# Patient Record
Sex: Female | Born: 1935
Health system: Southern US, Community
[De-identification: ages and names within clinical notes are randomized; demographics above are authoritative.]

## PROBLEM LIST (undated history)

## (undated) ENCOUNTER — Emergency Department (HOSPITAL_COMMUNITY): Admission: EM | Payer: Medicare PPO | Source: Home / Self Care

## (undated) DIAGNOSIS — J309 Allergic rhinitis, unspecified: Secondary | ICD-10-CM

## (undated) DIAGNOSIS — J069 Acute upper respiratory infection, unspecified: Secondary | ICD-10-CM

## (undated) DIAGNOSIS — Z9189 Other specified personal risk factors, not elsewhere classified: Secondary | ICD-10-CM

## (undated) DIAGNOSIS — IMO0001 Reserved for inherently not codable concepts without codable children: Secondary | ICD-10-CM

## (undated) DIAGNOSIS — M129 Arthropathy, unspecified: Secondary | ICD-10-CM

## (undated) DIAGNOSIS — M2550 Pain in unspecified joint: Secondary | ICD-10-CM

## (undated) DIAGNOSIS — T4145XA Adverse effect of unspecified anesthetic, initial encounter: Secondary | ICD-10-CM

## (undated) DIAGNOSIS — M25539 Pain in unspecified wrist: Secondary | ICD-10-CM

## (undated) DIAGNOSIS — M549 Dorsalgia, unspecified: Secondary | ICD-10-CM

## (undated) DIAGNOSIS — Z8489 Family history of other specified conditions: Secondary | ICD-10-CM

## (undated) DIAGNOSIS — T8859XA Other complications of anesthesia, initial encounter: Secondary | ICD-10-CM

## (undated) DIAGNOSIS — M1389 Other specified arthritis, multiple sites: Secondary | ICD-10-CM

## (undated) DIAGNOSIS — E785 Hyperlipidemia, unspecified: Secondary | ICD-10-CM

## (undated) DIAGNOSIS — G4700 Insomnia, unspecified: Secondary | ICD-10-CM

## (undated) DIAGNOSIS — I10 Essential (primary) hypertension: Secondary | ICD-10-CM

## (undated) DIAGNOSIS — L03119 Cellulitis of unspecified part of limb: Secondary | ICD-10-CM

## (undated) DIAGNOSIS — L02519 Cutaneous abscess of unspecified hand: Secondary | ICD-10-CM

## (undated) DIAGNOSIS — M542 Cervicalgia: Secondary | ICD-10-CM

## (undated) HISTORY — DX: Other specified personal risk factors, not elsewhere classified: Z91.89

## (undated) HISTORY — DX: Essential (primary) hypertension: I10

## (undated) HISTORY — DX: Dorsalgia, unspecified: M54.9

## (undated) HISTORY — DX: Hyperlipidemia, unspecified: E78.5

## (undated) HISTORY — PX: BUNIONECTOMY: SHX129

## (undated) HISTORY — DX: Insomnia, unspecified: G47.00

## (undated) HISTORY — DX: Pain in unspecified wrist: M25.539

## (undated) HISTORY — DX: Reserved for inherently not codable concepts without codable children: IMO0001

## (undated) HISTORY — DX: Cervicalgia: M54.2

## (undated) HISTORY — PX: OTHER SURGICAL HISTORY: SHX169

## (undated) HISTORY — DX: Cutaneous abscess of unspecified hand: L02.519

## (undated) HISTORY — DX: Pain in unspecified joint: M25.50

## (undated) HISTORY — DX: Acute upper respiratory infection, unspecified: J06.9

## (undated) HISTORY — DX: Allergic rhinitis, unspecified: J30.9

## (undated) HISTORY — DX: Cellulitis of unspecified part of limb: L03.119

## (undated) HISTORY — PX: FOOT SURGERY: SHX648

## (undated) HISTORY — DX: Arthropathy, unspecified: M12.9

## (undated) HISTORY — PX: JOINT REPLACEMENT: SHX530

## (undated) HISTORY — DX: Other specified arthritis, multiple sites: M13.89

---

## 1998-10-03 ENCOUNTER — Other Ambulatory Visit: Admission: RE | Admit: 1998-10-03 | Discharge: 1998-10-03 | Payer: Self-pay | Admitting: Emergency Medicine

## 1999-10-16 ENCOUNTER — Other Ambulatory Visit: Admission: RE | Admit: 1999-10-16 | Discharge: 1999-10-16 | Payer: Self-pay | Admitting: Emergency Medicine

## 2001-01-14 ENCOUNTER — Other Ambulatory Visit: Admission: RE | Admit: 2001-01-14 | Discharge: 2001-01-14 | Payer: Self-pay | Admitting: Emergency Medicine

## 2001-05-26 ENCOUNTER — Encounter: Admission: RE | Admit: 2001-05-26 | Discharge: 2001-06-16 | Payer: Self-pay | Admitting: Orthopedic Surgery

## 2002-04-06 ENCOUNTER — Other Ambulatory Visit: Admission: RE | Admit: 2002-04-06 | Discharge: 2002-04-06 | Payer: Self-pay | Admitting: Family Medicine

## 2002-06-16 ENCOUNTER — Encounter: Payer: Self-pay | Admitting: Family Medicine

## 2002-06-16 ENCOUNTER — Ambulatory Visit (HOSPITAL_COMMUNITY): Admission: RE | Admit: 2002-06-16 | Discharge: 2002-06-16 | Payer: Self-pay | Admitting: Family Medicine

## 2003-04-09 ENCOUNTER — Other Ambulatory Visit: Admission: RE | Admit: 2003-04-09 | Discharge: 2003-04-09 | Payer: Self-pay | Admitting: Family Medicine

## 2004-04-21 ENCOUNTER — Other Ambulatory Visit: Admission: RE | Admit: 2004-04-21 | Discharge: 2004-04-21 | Payer: Self-pay | Admitting: Family Medicine

## 2005-04-22 ENCOUNTER — Other Ambulatory Visit: Admission: RE | Admit: 2005-04-22 | Discharge: 2005-04-22 | Payer: Self-pay | Admitting: Family Medicine

## 2005-09-14 ENCOUNTER — Ambulatory Visit (HOSPITAL_COMMUNITY): Admission: RE | Admit: 2005-09-14 | Discharge: 2005-09-14 | Payer: Self-pay | Admitting: Orthopedic Surgery

## 2005-09-28 ENCOUNTER — Emergency Department (HOSPITAL_COMMUNITY): Admission: EM | Admit: 2005-09-28 | Discharge: 2005-09-29 | Payer: Self-pay | Admitting: Emergency Medicine

## 2006-03-29 ENCOUNTER — Encounter: Admission: RE | Admit: 2006-03-29 | Discharge: 2006-03-29 | Payer: Self-pay | Admitting: Orthopedic Surgery

## 2006-04-01 ENCOUNTER — Ambulatory Visit (HOSPITAL_BASED_OUTPATIENT_CLINIC_OR_DEPARTMENT_OTHER): Admission: RE | Admit: 2006-04-01 | Discharge: 2006-04-02 | Payer: Self-pay | Admitting: Orthopedic Surgery

## 2006-04-01 ENCOUNTER — Encounter (INDEPENDENT_AMBULATORY_CARE_PROVIDER_SITE_OTHER): Payer: Self-pay | Admitting: Specialist

## 2006-04-29 ENCOUNTER — Other Ambulatory Visit: Admission: RE | Admit: 2006-04-29 | Discharge: 2006-04-29 | Payer: Self-pay | Admitting: Family Medicine

## 2007-03-21 ENCOUNTER — Ambulatory Visit: Payer: Self-pay | Admitting: Internal Medicine

## 2007-07-21 ENCOUNTER — Ambulatory Visit: Payer: Self-pay | Admitting: Internal Medicine

## 2007-10-28 DIAGNOSIS — E785 Hyperlipidemia, unspecified: Secondary | ICD-10-CM

## 2007-10-28 DIAGNOSIS — M179 Osteoarthritis of knee, unspecified: Secondary | ICD-10-CM | POA: Insufficient documentation

## 2007-10-28 DIAGNOSIS — Z9189 Other specified personal risk factors, not elsewhere classified: Secondary | ICD-10-CM

## 2007-10-28 DIAGNOSIS — I10 Essential (primary) hypertension: Secondary | ICD-10-CM

## 2007-10-28 DIAGNOSIS — M129 Arthropathy, unspecified: Secondary | ICD-10-CM

## 2007-10-28 DIAGNOSIS — M1710 Unilateral primary osteoarthritis, unspecified knee: Secondary | ICD-10-CM

## 2007-10-28 DIAGNOSIS — J309 Allergic rhinitis, unspecified: Secondary | ICD-10-CM

## 2007-10-28 DIAGNOSIS — J302 Other seasonal allergic rhinitis: Secondary | ICD-10-CM | POA: Insufficient documentation

## 2007-10-28 HISTORY — DX: Essential (primary) hypertension: I10

## 2007-10-28 HISTORY — DX: Allergic rhinitis, unspecified: J30.9

## 2007-10-28 HISTORY — DX: Hyperlipidemia, unspecified: E78.5

## 2007-10-28 HISTORY — DX: Arthropathy, unspecified: M12.9

## 2007-10-28 HISTORY — DX: Other specified personal risk factors, not elsewhere classified: Z91.89

## 2007-10-31 ENCOUNTER — Ambulatory Visit: Payer: Self-pay | Admitting: Internal Medicine

## 2007-10-31 DIAGNOSIS — G4700 Insomnia, unspecified: Secondary | ICD-10-CM

## 2007-10-31 HISTORY — DX: Insomnia, unspecified: G47.00

## 2008-04-11 ENCOUNTER — Ambulatory Visit: Payer: Self-pay | Admitting: Internal Medicine

## 2008-04-11 LAB — CONVERTED CEMR LAB
AST: 23 units/L (ref 0–37)
Albumin: 4.1 g/dL (ref 3.5–5.2)
CO2: 29 meq/L (ref 19–32)
Calcium: 9.5 mg/dL (ref 8.4–10.5)
Direct LDL: 84.1 mg/dL
HCT: 37.5 % (ref 36.0–46.0)
HDL: 97.8 mg/dL (ref >39.0)
Lymphocytes Relative: 25 % (ref 12.0–46.0)
MCV: 91.5 fL (ref 78.0–100.0)
Neutrophils Relative %: 66.5 % (ref 43.0–77.0)
Potassium: 3.9 meq/L (ref 3.5–5.1)
RBC: 4.1 M/uL (ref 3.87–5.11)
RDW: 12.8 % (ref 11.5–14.6)
Sodium: 149 meq/L — ABNORMAL HIGH (ref 135–145)
Total CHOL/HDL Ratio: 2.1
WBC: 7.5 10*3/uL (ref 4.5–10.5)

## 2008-04-12 ENCOUNTER — Telehealth: Payer: Self-pay | Admitting: Internal Medicine

## 2008-04-12 ENCOUNTER — Encounter: Payer: Self-pay | Admitting: Internal Medicine

## 2008-04-16 ENCOUNTER — Telehealth: Payer: Self-pay | Admitting: Internal Medicine

## 2008-05-08 ENCOUNTER — Encounter: Payer: Self-pay | Admitting: Internal Medicine

## 2008-05-08 LAB — HM MAMMOGRAPHY: HM Mammogram: NORMAL

## 2008-10-08 ENCOUNTER — Ambulatory Visit: Payer: Self-pay | Admitting: Internal Medicine

## 2008-10-08 LAB — CONVERTED CEMR LAB
BUN: 16 mg/dL (ref 6–23)
GFR calc Af Amer: 90 mL/min
GFR calc non Af Amer: 74 mL/min
Glucose, Bld: 91 mg/dL (ref 70–99)
Potassium: 3.9 meq/L (ref 3.5–5.1)
Sodium: 143 meq/L (ref 135–145)

## 2008-10-11 ENCOUNTER — Encounter: Payer: Self-pay | Admitting: Internal Medicine

## 2008-11-26 ENCOUNTER — Ambulatory Visit: Payer: Self-pay | Admitting: Endocrinology

## 2008-11-26 DIAGNOSIS — J069 Acute upper respiratory infection, unspecified: Secondary | ICD-10-CM

## 2008-11-26 HISTORY — DX: Acute upper respiratory infection, unspecified: J06.9

## 2008-12-03 ENCOUNTER — Ambulatory Visit: Payer: Self-pay | Admitting: Endocrinology

## 2008-12-03 ENCOUNTER — Telehealth (INDEPENDENT_AMBULATORY_CARE_PROVIDER_SITE_OTHER): Payer: Self-pay | Admitting: *Deleted

## 2008-12-03 DIAGNOSIS — M542 Cervicalgia: Secondary | ICD-10-CM

## 2008-12-03 HISTORY — DX: Cervicalgia: M54.2

## 2008-12-06 ENCOUNTER — Telehealth: Payer: Self-pay | Admitting: Internal Medicine

## 2008-12-07 ENCOUNTER — Ambulatory Visit: Payer: Self-pay | Admitting: Internal Medicine

## 2008-12-10 ENCOUNTER — Ambulatory Visit: Payer: Self-pay | Admitting: Internal Medicine

## 2009-03-25 ENCOUNTER — Ambulatory Visit: Payer: Self-pay | Admitting: Internal Medicine

## 2009-04-08 ENCOUNTER — Encounter: Payer: Self-pay | Admitting: Internal Medicine

## 2009-04-08 ENCOUNTER — Ambulatory Visit: Payer: Self-pay | Admitting: Internal Medicine

## 2009-04-11 ENCOUNTER — Telehealth (INDEPENDENT_AMBULATORY_CARE_PROVIDER_SITE_OTHER): Payer: Self-pay | Admitting: *Deleted

## 2009-04-11 ENCOUNTER — Encounter: Payer: Self-pay | Admitting: Internal Medicine

## 2009-04-12 ENCOUNTER — Ambulatory Visit: Payer: Self-pay | Admitting: Internal Medicine

## 2009-04-12 DIAGNOSIS — M549 Dorsalgia, unspecified: Secondary | ICD-10-CM | POA: Insufficient documentation

## 2009-04-12 HISTORY — DX: Dorsalgia, unspecified: M54.9

## 2009-04-19 ENCOUNTER — Telehealth: Payer: Self-pay | Admitting: Internal Medicine

## 2009-06-20 ENCOUNTER — Ambulatory Visit: Payer: Self-pay | Admitting: Internal Medicine

## 2009-07-01 ENCOUNTER — Emergency Department (HOSPITAL_COMMUNITY): Admission: EM | Admit: 2009-07-01 | Discharge: 2009-07-01 | Payer: Self-pay | Admitting: Family Medicine

## 2010-02-03 ENCOUNTER — Telehealth (INDEPENDENT_AMBULATORY_CARE_PROVIDER_SITE_OTHER): Payer: Self-pay | Admitting: *Deleted

## 2010-02-20 ENCOUNTER — Ambulatory Visit: Payer: Self-pay | Admitting: Internal Medicine

## 2010-02-22 ENCOUNTER — Ambulatory Visit: Payer: Self-pay | Admitting: Internal Medicine

## 2010-02-24 ENCOUNTER — Telehealth: Payer: Self-pay | Admitting: Internal Medicine

## 2010-02-27 ENCOUNTER — Encounter: Payer: Self-pay | Admitting: Internal Medicine

## 2010-03-23 ENCOUNTER — Emergency Department (HOSPITAL_BASED_OUTPATIENT_CLINIC_OR_DEPARTMENT_OTHER): Admission: EM | Admit: 2010-03-23 | Discharge: 2010-03-23 | Payer: Self-pay | Admitting: Emergency Medicine

## 2010-03-27 ENCOUNTER — Encounter: Payer: Self-pay | Admitting: Internal Medicine

## 2010-03-31 ENCOUNTER — Telehealth: Payer: Self-pay | Admitting: Internal Medicine

## 2010-04-25 ENCOUNTER — Ambulatory Visit: Payer: Self-pay | Admitting: Internal Medicine

## 2010-04-25 LAB — CONVERTED CEMR LAB
ALT: 19 units/L (ref 0–35)
AST: 24 units/L (ref 0–37)
BUN: 18 mg/dL (ref 6–23)
Basophils Absolute: 0 10*3/uL (ref 0.0–0.1)
Calcium: 9.5 mg/dL (ref 8.4–10.5)
Cholesterol: 234 mg/dL — ABNORMAL HIGH (ref 0–200)
Creatinine, Ser: 0.7 mg/dL (ref 0.4–1.2)
Eosinophils Absolute: 0.1 10*3/uL (ref 0.0–0.7)
GFR calc non Af Amer: 101.11 mL/min (ref >60)
HDL: 110.6 mg/dL (ref >39.00)
Lymphs Abs: 1.9 10*3/uL (ref 0.7–4.0)
MCHC: 34.9 g/dL (ref 30.0–36.0)
MCV: 92.9 fL (ref 78.0–100.0)
Neutrophils Relative %: 67.2 % (ref 43.0–77.0)
Potassium: 4.1 meq/L (ref 3.5–5.1)
Sodium: 140 meq/L (ref 135–145)
Total Bilirubin: 0.8 mg/dL (ref 0.3–1.2)
Triglycerides: 95 mg/dL (ref 0.0–149.0)
VLDL: 19 mg/dL (ref 0.0–40.0)
WBC: 8.4 10*3/uL (ref 4.5–10.5)

## 2010-05-29 ENCOUNTER — Encounter: Payer: Self-pay | Admitting: Internal Medicine

## 2010-06-12 ENCOUNTER — Encounter: Payer: Self-pay | Admitting: Internal Medicine

## 2010-06-19 ENCOUNTER — Encounter: Payer: Self-pay | Admitting: Internal Medicine

## 2010-06-26 ENCOUNTER — Encounter: Payer: Self-pay | Admitting: Internal Medicine

## 2010-07-03 ENCOUNTER — Encounter: Payer: Self-pay | Admitting: Internal Medicine

## 2010-07-10 ENCOUNTER — Encounter: Payer: Self-pay | Admitting: Internal Medicine

## 2010-07-31 ENCOUNTER — Encounter: Payer: Self-pay | Admitting: Internal Medicine

## 2010-08-07 ENCOUNTER — Encounter: Payer: Self-pay | Admitting: Internal Medicine

## 2010-09-02 ENCOUNTER — Ambulatory Visit: Payer: Self-pay | Admitting: Internal Medicine

## 2010-09-08 ENCOUNTER — Telehealth: Payer: Self-pay | Admitting: Internal Medicine

## 2010-09-22 ENCOUNTER — Telehealth: Payer: Self-pay | Admitting: Internal Medicine

## 2010-10-07 ENCOUNTER — Ambulatory Visit
Admission: RE | Admit: 2010-10-07 | Discharge: 2010-10-07 | Payer: Self-pay | Source: Home / Self Care | Attending: Internal Medicine | Admitting: Internal Medicine

## 2010-10-07 DIAGNOSIS — M2550 Pain in unspecified joint: Secondary | ICD-10-CM

## 2010-10-07 HISTORY — DX: Pain in unspecified joint: M25.50

## 2010-10-14 ENCOUNTER — Telehealth: Payer: Self-pay | Admitting: Internal Medicine

## 2010-10-21 ENCOUNTER — Telehealth: Payer: Self-pay | Admitting: Internal Medicine

## 2010-10-22 ENCOUNTER — Encounter: Payer: Self-pay | Admitting: Internal Medicine

## 2010-10-22 ENCOUNTER — Ambulatory Visit
Admission: RE | Admit: 2010-10-22 | Discharge: 2010-10-22 | Payer: Self-pay | Source: Home / Self Care | Attending: Internal Medicine | Admitting: Internal Medicine

## 2010-10-22 ENCOUNTER — Other Ambulatory Visit: Payer: Self-pay | Admitting: Internal Medicine

## 2010-10-22 DIAGNOSIS — M25539 Pain in unspecified wrist: Secondary | ICD-10-CM

## 2010-10-22 DIAGNOSIS — IMO0001 Reserved for inherently not codable concepts without codable children: Secondary | ICD-10-CM

## 2010-10-22 DIAGNOSIS — M1389 Other specified arthritis, multiple sites: Secondary | ICD-10-CM

## 2010-10-22 HISTORY — DX: Reserved for inherently not codable concepts without codable children: IMO0001

## 2010-10-22 HISTORY — DX: Pain in unspecified wrist: M25.539

## 2010-10-22 HISTORY — DX: Other specified arthritis, multiple sites: M13.89

## 2010-10-22 LAB — CONVERTED CEMR LAB: Rhuematoid fact SerPl-aCnc: <10 intl units/mL (ref <=14)

## 2010-10-22 LAB — BASIC METABOLIC PANEL
BUN: 16 mg/dL (ref 6–23)
CO2: 27 mEq/L (ref 19–32)
Calcium: 9.7 mg/dL (ref 8.4–10.5)
Chloride: 101 mEq/L (ref 96–112)
Creatinine, Ser: 0.8 mg/dL (ref 0.4–1.2)
GFR: 93.45 mL/min (ref >60.00)
Glucose, Bld: 163 mg/dL — ABNORMAL HIGH (ref 70–99)
Potassium: 4.7 mEq/L (ref 3.5–5.1)
Sodium: 138 mEq/L (ref 135–145)

## 2010-10-22 LAB — CBC WITH DIFFERENTIAL/PLATELET
Basophils Absolute: 0 10*3/uL (ref 0.0–0.1)
Basophils Relative: 0.1 % (ref 0.0–3.0)
Eosinophils Absolute: 0 10*3/uL (ref 0.0–0.7)
Eosinophils Relative: 0.1 % (ref 0.0–5.0)
HCT: 36.4 % (ref 36.0–46.0)
Hemoglobin: 12.5 g/dL (ref 12.0–15.0)
Lymphocytes Relative: 7.8 % — ABNORMAL LOW (ref 12.0–46.0)
Lymphs Abs: 1 10*3/uL (ref 0.7–4.0)
MCHC: 34.3 g/dL (ref 30.0–36.0)
MCV: 92.4 fl (ref 78.0–100.0)
Monocytes Absolute: 0.3 10*3/uL (ref 0.1–1.0)
Monocytes Relative: 2.7 % — ABNORMAL LOW (ref 3.0–12.0)
Neutro Abs: 11.1 10*3/uL — ABNORMAL HIGH (ref 1.4–7.7)
Neutrophils Relative %: 89.3 % — ABNORMAL HIGH (ref 43.0–77.0)
Platelets: 259 10*3/uL (ref 150.0–400.0)
RBC: 3.94 Mil/uL (ref 3.87–5.11)
RDW: 15 % — ABNORMAL HIGH (ref 11.5–14.6)
WBC: 12.4 10*3/uL — ABNORMAL HIGH (ref 4.5–10.5)

## 2010-10-22 LAB — HEPATIC FUNCTION PANEL
ALT: 16 U/L (ref 0–35)
AST: 21 U/L (ref 0–37)
Albumin: 3.9 g/dL (ref 3.5–5.2)
Alkaline Phosphatase: 91 U/L (ref 39–117)
Bilirubin, Direct: 0.1 mg/dL (ref 0.0–0.3)
Total Bilirubin: 0.9 mg/dL (ref 0.3–1.2)
Total Protein: 7.6 g/dL (ref 6.0–8.3)

## 2010-10-22 LAB — URIC ACID: Uric Acid, Serum: 4.2 mg/dL (ref 2.4–7.0)

## 2010-10-22 LAB — SEDIMENTATION RATE: Sed Rate: 43 mm/hr — ABNORMAL HIGH (ref 0–22)

## 2010-10-22 LAB — TSH: TSH: 1.04 u[IU]/mL (ref 0.35–5.50)

## 2010-10-22 LAB — HIGH SENSITIVITY CRP: CRP, High Sensitivity: 30.92 mg/L — ABNORMAL HIGH (ref 0.00–5.00)

## 2010-10-22 LAB — CK: Total CK: 58 U/L (ref 7–177)

## 2010-10-23 ENCOUNTER — Telehealth: Payer: Self-pay | Admitting: Internal Medicine

## 2010-10-23 DIAGNOSIS — L02519 Cutaneous abscess of unspecified hand: Secondary | ICD-10-CM

## 2010-10-23 DIAGNOSIS — L03119 Cellulitis of unspecified part of limb: Secondary | ICD-10-CM

## 2010-10-23 HISTORY — DX: Cellulitis of unspecified part of limb: L03.119

## 2010-10-23 HISTORY — DX: Cutaneous abscess of unspecified hand: L02.519

## 2010-10-30 ENCOUNTER — Encounter: Payer: Self-pay | Admitting: Internal Medicine

## 2010-11-05 ENCOUNTER — Ambulatory Visit: Admit: 2010-11-05 | Payer: Self-pay | Admitting: Internal Medicine

## 2010-11-11 NOTE — Progress Notes (Signed)
Summary: Needs New Referral  Phone Note Call from Patient   Summary of Call: Patient is requesting a call back regarding ortho MD.  Initial call taken by: Lamar Sprinkles, CMA,  March 31, 2010 11:10 AM  Follow-up for Phone Call        Patient needs a new referral for ortho. Per pt, Last ortho was out of network. She was seen by previous ortho due to not wanting to wait b/c of knee pain. For continued care she can not afford out of network costs.    Insurance told pt to request referral to Dr Molli Hazard Rabish [address] [phone]. Insurance approval # [id number].   Follow-up by: Lamar Sprinkles, CMA,  March 31, 2010 3:59 PM  Additional Follow-up for Phone Call Additional follow up Details #1::        I haven't hear of Dr. Mindi Junker - will have [facility] tell me who is in network that I know Additional Follow-up by: Jacques Navy MD,  March 31, 2010 6:20 PM

## 2010-11-11 NOTE — Op Note (Signed)
Summary: 3rd Hyalgan Injection / [facility]   3rd Hyalgan Injection / [facility]   Imported By: Lennie Odor 06/30/2010 15:20:28  _____________________________________________________________________  External Attachment:    Type:   Image     Comment:   External Document

## 2010-11-11 NOTE — Op Note (Signed)
Summary: [facility]  [facility]   Imported By: Sherian Rein 06/23/2010 14:42:25  _____________________________________________________________________  External Attachment:    Type:   Image     Comment:   External Document

## 2010-11-11 NOTE — Letter (Signed)
Summary: [facility]  [facility]   Imported By: Sherian Rein 07/08/2010 14:36:34  _____________________________________________________________________  External Attachment:    Type:   Image     Comment:   External Document

## 2010-11-11 NOTE — Progress Notes (Signed)
Summary: REFILL - Temazepam  Phone Note Refill Request Message from:  Pharmacy  Refills Requested: Medication #1:  TEMAZEPAM 15 MG CAPS Take 1 tab by mouth at bedtime as needed Initial call taken by: Lamar Sprinkles, CMA,  February 03, 2010 7:52 AM  Follow-up for Phone Call        OK for refill x 5 Follow-up by: Jacques Navy MD,  February 03, 2010 9:11 AM    Prescriptions: TEMAZEPAM 15 MG CAPS (TEMAZEPAM) Take 1 tab by mouth at bedtime as needed  #30 x 5   Entered by:   Ami Bullins CMA   Authorized by:   Jacques Navy MD   Signed by:   Bill Salinas CMA on 02/03/2010   Method used:   Telephoned to ...       [facility] [address] [phone]* (retail)       [address]       [place], [place]  [address]       Ph: [phone]       Fax: [phone]   RxID:   [id number]

## 2010-11-11 NOTE — Consult Note (Signed)
Summary: [facility]   Imported By: Lester Cadwell 06/18/2010 07:47:10  _____________________________________________________________________  External Attachment:    Type:   Image     Comment:   External Document

## 2010-11-11 NOTE — Assessment & Plan Note (Signed)
Summary: acute   Vital Signs:  Patient profile:   77 year old female Height:      64 inches Weight:      170 pounds BMI:     29.29 O2 Sat:      97 % on Room air Temp:     98.3 degrees F oral Pulse rate:   90 / minute BP sitting:   144 / 80  (left arm) Cuff size:   regular  Vitals Entered By: Bill Salinas CMA (September 02, 2010 4:18 PM)  O2 Flow:  Room air CC: pt here with c/o aching all over with cold chills and nausea x 2 days/ ab   Primary Care Provider:  Iisha Soyars  CC:  pt here with c/o aching all over with cold chills and nausea x 2 days/ ab.  History of Present Illness: In the interval since her last visit in August she has had a series of 5 hyalgan injections in each knee with the last injection 10/27. she has also been taking tramadol for pain but she is limited by nausea. .  She presents acutely today due to the onset of mild chills and myalgias affecting arms, legs, back - aches all over and has persistent chills (not rigors). NO coughing but she does have a lot of phlegm. No sick contacts. No documented fever. No change in bowel function. No swollen or red joints. No dysuria. Chronic urinary frequency has not changed. No tightness in the chest or pain. She has not taken any other medications. She did have the flu shot several months ago.   Current Medications (verified): 1)  Atenolol 100 Mg  Tabs (Atenolol) .... Take 1 Tablet By Mouth Every Morning 2)  Allegra 180 Mg  Tabs (Fexofenadine Hcl) .... As Needed 3)  Amlodipine Besylate 10 Mg  Tabs (Amlodipine Besylate) .Marland Kitchen.. 1 Once Daily 4)  Simvastatin 40 Mg  Tabs (Simvastatin) .Marland Kitchen.. 1 By Mouth Qpm 5)  Temazepam 15 Mg Caps (Temazepam) .... Take 1 Tab By Mouth At Bedtime As Needed 6)  Methocarbamol 500 Mg Tabs (Methocarbamol) .Marland Kitchen.. 1-2 Q6h As Needed Neck Pain 7)  Meloxicam 15 Mg Tabs (Meloxicam) .Marland Kitchen.. 1 By Mouth Once Daily 8)  Tramadol Hcl 50 Mg Tabs (Tramadol Hcl) .Marland Kitchen.. 1 Tab By Mouth Three Times A Day For Severe Knee  Pain  Allergies (verified): 1)  ! Dilacor Xr 2)  ! Verapamil 3)  ! Lisinopril 4)  ! Hydrochlorothiazide  Past History:  Past Medical History: Last updated: 10/28/2007 Allergic rhinitis Hyperlipidemia Hypertension ARTHRITIS, KNEES, BILATERAL (ICD-716.98)  Past Surgical History: Last updated: 11-17-2007 * ARTHROSCOPIC SURGERY AND ROTATOR CUFF REPAIR LEFT SHOULDER ARTHROSCOPY, RIGHT KNEE, HX OF SURGERY (ICD-V45.89) * HAND SURGERY FOR BROKEN FINGER, REMOTE BUNIONECTOMY, HX OF (ICD-V15.89)  G7P5  2 SABs    Family History: Last updated: Nov 17, 2007 father - died 37's MI mother - died 78's HTN 3 siblings with CAD/MI 1 brother with lung cancer Neg - breast cancer  Social History: Last updated: 04/25/2010 HSG married '68 widowed '00 4 sons ; 1 daughter; 6 grandchildren; 3 great-grands 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  Review of Systems  The patient denies anorexia, fever, weight loss, weight gain, hoarseness, chest pain, syncope, dyspnea on exertion, peripheral edema, headaches, hemoptysis, abdominal pain, incontinence, muscle weakness, transient blindness, depression, and enlarged lymph nodes.    Physical Exam  General:  WNWD AA woman looking younger than her 28  years. Head:  normocephalic and atraumatic.   Eyes:  pupils equal and pupils round.  C&S clear Neck:  supple, full ROM, and no thyromegaly.   Chest Wall:  no deformities and no tenderness.   Lungs:  normal respiratory effort, normal breath sounds, no crackles, and no wheezes.   Heart:  normal rate, regular rhythm, no JVD, and no HJR.   Abdomen:  soft, non-tender, normal bowel sounds, no masses, no guarding, and no hepatomegaly.   Msk:  normal ROM, no joint tenderness, no joint swelling, no joint warmth, no joint deformities, and no joint instability.   Pulses:  2+ radial bilaterally Neurologic:  alert & oriented X3, cranial nerves II-XII  intact, strength normal in all extremities, gait normal, and DTRs symmetrical and normal.   Skin:  turgor normal, color normal, no rashes, and no suspicious lesions.   Cervical Nodes:  no anterior cervical adenopathy and no posterior cervical adenopathy.  No supraclavicular nodes Psych:  normally interactive, good eye contact, and not anxious appearing.     Impression & Recommendations:  Problem # 1:  VIRAL INFECTION-UNSPEC (ICD-079.99) Patient with diffuse myalgias with no other specific findings c/w non-specific viral illness. No respiratory symptoms.  Plan - supportive care-see pt instrruction.   Her updated medication list for this problem includes:    Meloxicam 15 Mg Tabs (Meloxicam) .Marland Kitchen... 1 by mouth once daily  Complete Medication List: 1)  Atenolol 100 Mg Tabs (Atenolol) .... Take 1 tablet by mouth every morning 2)  Allegra 180 Mg Tabs (Fexofenadine hcl) .... As needed 3)  Amlodipine Besylate 10 Mg Tabs (Amlodipine besylate) .Marland Kitchen.. 1 once daily 4)  Simvastatin 40 Mg Tabs (Simvastatin) .Marland Kitchen.. 1 by mouth qpm 5)  Temazepam 15 Mg Caps (Temazepam) .... Take 1 tab by mouth at bedtime as needed 6)  Methocarbamol 500 Mg Tabs (Methocarbamol) .Marland Kitchen.. 1-2 q6h as needed neck pain 7)  Meloxicam 15 Mg Tabs (Meloxicam) .Marland Kitchen.. 1 by mouth once daily 8)  Tramadol Hcl 50 Mg Tabs (Tramadol hcl) .Marland Kitchen.. 1 tab by mouth three times a day for severe knee pain  Patient Instructions: 1)  diffuse aches and pains with a negative physical exam is very suggestive of a non-specific viral illness. Plan - hydrate; take 1500mg  Vitamin C; take ecchinacea as pill or tea or in juice; continue to take meloxicam 15mg  once a day; take tylenol 500mg  2 tablets three times a day on schedule. You need to follow a bland easy diet. You need to rest - should not return to work until feeling better. Call for fever, nausea or vomiting, productive cough or shortness of breath.   Orders Added: 1)  Est. Patient Level III [09811]

## 2010-11-11 NOTE — Assessment & Plan Note (Signed)
Summary: SEEN THURSDAY-- LEG PAIN NO BETTER//VGJ   Vital Signs:  Patient profile:   77 year old female Weight:      181 pounds Temp:     97.6 degrees F oral BP sitting:   122 / 80  (left arm)  Vitals Entered By: Doristine Devoid (Feb 22, 2010 10:28 AM) CC: L leg pain around knee hurts constantly   History of Present Illness: Having bad pain in left knee and upper posterior calf esp when standing up Generally okay when sitting Seems to have worsened since Thursday  No calf swelling tried soaking knee with epsom salts Knee is not tender  Feels like drawing behind knee  meds not helping  Allergies: 1)  ! Dilacor Xr 2)  ! Verapamil 3)  ! Lisinopril 4)  ! Hydrochlorothiazide  Past History:  Past medical, surgical, family and social histories (including risk factors) reviewed for relevance to current acute and chronic problems.  Past Medical History: Reviewed history from 10/28/2007 and no changes required. Allergic rhinitis Hyperlipidemia Hypertension ARTHRITIS, KNEES, BILATERAL (ICD-716.98)  Past Surgical History: Reviewed history from 10/31/2007 and no changes required. * ARTHROSCOPIC SURGERY AND ROTATOR CUFF REPAIR LEFT SHOULDER ARTHROSCOPY, RIGHT KNEE, HX OF SURGERY (ICD-V45.89) * HAND SURGERY FOR BROKEN FINGER, REMOTE BUNIONECTOMY, HX OF (ICD-V15.89)  G7P5  2 SABs    Family History: Reviewed history from 10/31/2007 and no changes required. father - died 5's MI mother - died 53's HTN 3 siblings with CAD/MI 1 brother with lung cancer Neg - breast cancer  Social History: Reviewed history from 04/11/2008 and no changes required. 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  Review of Systems       no chest pain  No SOB No history of DVT  Physical Exam  General:  alert.   NAD Msk:  moderate knee deformity bilat seems to have small effusion in left knee tenderness along LCL  Pain with knee flexion  Neurologic:  antalgic gait but full weight bearing   Impression & Recommendations:  Problem # 1:  ARTHRITIS, KNEES, BILATERAL (ICD-716.98) Assessment Deteriorated  this seems to be the area of her pain may benefit from cortisone shot but would be technically difficult will have her wear brace analgesics needs ortho referral  Orders: Orthopedic Surgeon Referral (Ortho Surgeon)  Complete Medication List: 1)  Atenolol 100 Mg Tabs (Atenolol) .... Take 1 tablet by mouth every morning 2)  Allegra 180 Mg Tabs (Fexofenadine hcl) .... As needed 3)  Amlodipine Besylate 10 Mg Tabs (Amlodipine besylate) .Marland Kitchen.. 1 once daily 4)  Simvastatin 40 Mg Tabs (Simvastatin) .Marland Kitchen.. 1 by mouth qpm 5)  Temazepam 15 Mg Caps (Temazepam) .... Take 1 tab by mouth at bedtime as needed 6)  Methocarbamol 500 Mg Tabs (Methocarbamol) .Marland Kitchen.. 1-2 q6h as needed neck pain 7)  Meloxicam 15 Mg Tabs (Meloxicam) .Marland Kitchen.. 1 by mouth once daily 8)  Tramadol Hcl 50 Mg Tabs (Tramadol hcl) .Marland Kitchen.. 1 tab by mouth three times a day for severe knee pain  Patient Instructions: 1)  Please wear your knee brace 2)  Call Monday to set up appt with orthopedist Prescriptions: TRAMADOL HCL 50 MG TABS (TRAMADOL HCL) 1 tab by mouth three times a day for severe knee pain  #30 x 0   Entered and Authorized by:   Gerlene Burdock  Dia Crawford MD   Signed by:   Cindee Salt MD on 02/22/2010   Method used:   Electronically to        [facility] [phone]* (retail)       [address]       [place], [place]  [address]       Ph: [phone]       Fax: [phone]   RxID:   [id number]

## 2010-11-11 NOTE — Assessment & Plan Note (Signed)
Summary: YEARLY FU/ MEDICARE/ LABS AFTER/NWS   Vital Signs:  Patient profile:   77 year old female Height:      64 inches Weight:      183 pounds BMI:     31.53 O2 Sat:      99 % on Room air Temp:     97.7 degrees F oral Pulse rate:   56 / minute BP sitting:   132 / 80  (left arm) Cuff size:   regular  Vitals Entered By: Bill Salinas CMA (April 25, 2010 11:50 AM)  O2 Flow:  Room air Comments Pt not taking Methocarbamol or Tramadol  Vision Screening:      Vision Comments: Pt had eye exam July 13th 2011, she had slight change in prescription. 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 she reports that she had an injection to the right knee which has relieved some of her pain.  She generally feels that she is doing well.   Current Medications (verified): 1)  Atenolol 100 Mg  Tabs (Atenolol) .... Take 1 Tablet By Mouth Every Morning 2)  Allegra 180 Mg  Tabs (Fexofenadine Hcl) .... As Needed 3)  Amlodipine Besylate 10 Mg  Tabs (Amlodipine Besylate) .Marland Kitchen.. 1 Once Daily 4)  Simvastatin 40 Mg  Tabs (Simvastatin) .Marland Kitchen.. 1 By Mouth Qpm 5)  Temazepam 15 Mg Caps (Temazepam) .... Take 1 Tab By Mouth At Bedtime As Needed 6)  Methocarbamol 500 Mg Tabs (Methocarbamol) .Marland Kitchen.. 1-2 Q6h As Needed Neck Pain 7)  Meloxicam 15 Mg Tabs (Meloxicam) .Marland Kitchen.. 1 By Mouth Once Daily 8)  Tramadol Hcl 50 Mg Tabs (Tramadol Hcl) .Marland Kitchen.. 1 Tab By Mouth Three Times A Day For Severe Knee Pain  Allergies (verified): 1)  ! Dilacor Xr 2)  ! Verapamil 3)  ! Lisinopril 4)  ! Hydrochlorothiazide  Past History:  Past Medical History: Last updated: 10/28/2007 Allergic rhinitis Hyperlipidemia Hypertension ARTHRITIS, KNEES, BILATERAL (ICD-716.98)  Past Surgical History: Last updated: 10/31/2007 * ARTHROSCOPIC SURGERY AND ROTATOR CUFF REPAIR LEFT SHOULDER ARTHROSCOPY, RIGHT KNEE, HX OF SURGERY  (ICD-V45.89) * HAND SURGERY FOR BROKEN FINGER, REMOTE BUNIONECTOMY, HX OF (ICD-V15.89)  G7P5  2 SABs    Family History: Reviewed history from 10/31/2007 and no changes required. 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  Review of Systems  The patient denies anorexia, fever, weight loss, weight gain, vision loss, decreased hearing, chest pain, dyspnea on exertion, peripheral edema, headaches, severe indigestion/heartburn, incontinence, difficulty walking, depression, enlarged lymph nodes, and angioedema.     Complete Medication List: 1)  Atenolol 100 Mg Tabs (Atenolol) .... Take 1 tablet by mouth every morning 2)  Allegra 180 Mg Tabs (Fexofenadine hcl) .... As needed 3)  Amlodipine Besylate 10 Mg Tabs (Amlodipine besylate) .Marland Kitchen.. 1 once daily 4)  Simvastatin 40 Mg Tabs (Simvastatin) .Marland Kitchen.. 1 by mouth qpm 5)  Temazepam 15 Mg Caps (Temazepam) .... Take 1 tab by mouth at bedtime as needed 6)  Methocarbamol 500 Mg Tabs (Methocarbamol) .Marland Kitchen.. 1-2 q6h as needed neck pain 7)  Meloxicam 15 Mg Tabs (Meloxicam) .Marland KitchenMarland KitchenMarland Kitchen 1  by mouth once daily 8)  Tramadol Hcl 50 Mg Tabs (Tramadol hcl) .Marland Kitchen.. 1 tab by mouth three times a day for severe knee pain  Other Orders: TLB-Lipid Panel (80061-LIPID) TLB-Hepatic/Liver Function Pnl (80076-HEPATIC) TLB-BMP (Basic Metabolic Panel-BMET) (80048-METABOL) TLB-CBC Platelet - w/Differential (85025-CBCD) TLB-TSH (Thyroid Stimulating Hormone) (84443-TSH) Prescriptions: SIMVASTATIN 40 MG  TABS (SIMVASTATIN) 1 by mouth qPM  #30 Each x 12   Entered and Authorized by:   Jacques Navy MD   Signed by:   Jacques Navy MD on 04/25/2010   Method used:   Electronically to        [facility] [phone]* (retail)       [address]       [place], [place]  [address]       Ph: [phone]       Fax: [phone]   RxID:   [id number] ALLEGRA 180 MG  TABS (FEXOFENADINE HCL) as needed  #30 x 12   Entered and Authorized by:   Jacques Navy MD   Signed by:   Jacques Navy MD on 04/25/2010   Method used:   Electronically to        [facility] [phone]* (retail)       [address]       [place], [place]  [address]       Ph: [phone]       Fax: [phone]   RxID:   [id number]    Immunization History:  Influenza Immunization History:    Influenza:  declined (07/16/2009)

## 2010-11-11 NOTE — Assessment & Plan Note (Signed)
Summary: LOWER BACK AND L LEG PAIN---STC   Vital Signs:  Patient profile:   77 year old female Height:      64 inches Weight:      183 pounds BMI:     31.53 O2 Sat:      97 % on Room air Temp:     98.3 degrees F oral Pulse rate:   57 / minute BP sitting:   130 / 70  (left arm) Cuff size:   regular  Vitals Entered By: Bill Salinas CMA (Feb 20, 2010 4:31 PM)  O2 Flow:  Room air CC: pt here with complaint of back pain and pain in her left leg/ ab   Primary Care Provider:  Brittnay Pigman  CC:  pt here with complaint of back pain and pain in her left leg/ ab.  History of Present Illness: Pt complains of back pain and left leg pain that started at the end of last week and has worsened.  She recalls no incident which initiated the pain.   She describes pain over the right posterior superior iliac spine, at her left lateral knee and posterior left thigh.  Right now the pain in her left leg is worst.  It is a cramping pain as if her leg is "drawing up."  She was unable to continue standing at work today and had to come in to the office.  She denies shooting pain, loss of sensation in her legs, numbness, or tingling.  She denies fever, change in appetite or change in weight.    Current Medications (verified): 1)  Atenolol 100 Mg  Tabs (Atenolol) .... Take 1 Tablet By Mouth Every Morning 2)  Allegra 180 Mg  Tabs (Fexofenadine Hcl) .... As Needed 3)  Amlodipine Besylate 10 Mg  Tabs (Amlodipine Besylate) .Marland Kitchen.. 1 Once Daily 4)  Simvastatin 40 Mg  Tabs (Simvastatin) .Marland Kitchen.. 1 By Mouth Qpm 5)  Temazepam 15 Mg Caps (Temazepam) .... Take 1 Tab By Mouth At Bedtime As Needed 6)  Methocarbamol 500 Mg Tabs (Methocarbamol) .Marland Kitchen.. 1-2 Q6h As Needed Neck Pain 7)  Meloxicam 15 Mg Tabs (Meloxicam) .Marland Kitchen.. 1 By Mouth Once Daily  Allergies (verified): 1)  ! Dilacor Xr 2)  ! Verapamil 3)  ! Lisinopril 4)  ! Hydrochlorothiazide  Past History:  Past Medical History: Last updated: 10/28/2007 Allergic  rhinitis Hyperlipidemia Hypertension ARTHRITIS, KNEES, BILATERAL (ICD-716.98)  Past Surgical History: Last updated: November 22, 2007 * ARTHROSCOPIC SURGERY AND ROTATOR CUFF REPAIR LEFT SHOULDER ARTHROSCOPY, RIGHT KNEE, HX OF SURGERY (ICD-V45.89) * HAND SURGERY FOR BROKEN FINGER, REMOTE BUNIONECTOMY, HX OF (ICD-V15.89)  G7P5  2 SABs    Family History: Last updated: Nov 22, 2007 father - died 44's MI mother - died 51's HTN 3 siblings with CAD/MI 1 brother with lung cancer Neg - breast cancer  Social History: Last updated: 04/11/2008 HSG married '68 widowed '00 4 sons ; 1 daughter; 6 grandchildren; 1 great-grandchild, 2 great-grands on the way(June '09) 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  Review of Systems       The patient complains of difficulty walking.  The patient denies anorexia, fever, weight loss, weight gain, chest pain, dyspnea on exertion, peripheral edema, abdominal pain, melena, and hematochezia.    Physical Exam  General:  alert and well-developed.   Lungs:  normal respiratory effort, no accessory muscle use, and normal breath sounds.   Heart:  normal rate and regular rhythm.   Abdomen:  soft, non-tender, no distention, no guarding, and no rigidity.   Msk:  No focal tenderness to palpation of spinous processes.  Unable to elicit tenderness to palpation at areas of greatest pain (right posterior superior iliac spine, left lateral knee, posterior thigh).  Patient able to stand from sitting with difficulty.  Able to step up with each foot without support with difficulty.  SLR sitting and standing negative for low back pain.   Significant valgus deformities of the knees. Normal gait and ability to walk on toes and heels.   Neurologic:  DTRs symmetrical.  Normal fine touch sensation bilateral lower extremities.      Impression & Recommendations:  Problem # 1:  BACK PAIN (ICD-724.5) Her back and leg pain by  history and exam appears to be related to muscle spasm and inflammation of the PSIS.  Plan-Recommend application of heat for symptomatic relief        Continue taking meloxicam for reduction in inflammation        Topical application of Icy-Hot to PSIS for symptomatic relief         Her updated medication list for this problem includes:    Methocarbamol 500 Mg Tabs (Methocarbamol) .Marland Kitchen... 1-2 q6h as needed neck pain    Meloxicam 15 Mg Tabs (Meloxicam) .Marland Kitchen... 1 by mouth once daily  Complete Medication List: 1)  Atenolol 100 Mg Tabs (Atenolol) .... Take 1 tablet by mouth every morning 2)  Allegra 180 Mg Tabs (Fexofenadine hcl) .... As needed 3)  Amlodipine Besylate 10 Mg Tabs (Amlodipine besylate) .Marland Kitchen.. 1 once daily 4)  Simvastatin 40 Mg Tabs (Simvastatin) .Marland Kitchen.. 1 by mouth qpm 5)  Temazepam 15 Mg Caps (Temazepam) .... Take 1 tab by mouth at bedtime as needed 6)  Methocarbamol 500 Mg Tabs (Methocarbamol) .Marland Kitchen.. 1-2 q6h as needed neck pain 7)  Meloxicam 15 Mg Tabs (Meloxicam) .Marland Kitchen.. 1 by mouth once daily

## 2010-11-11 NOTE — Progress Notes (Signed)
Summary: REQ FOR RF   Phone Note Refill Request Message from:  Patient  Refills Requested: Medication #1:  TRAMADOL HCL 50 MG TABS 1 tab by mouth three times a day for severe knee pain. Initial call taken by: Lamar Sprinkles, CMA,  September 08, 2010 9:47 AM  Follow-up for Phone Call        ok for refill x 5 Follow-up by: Jacques Navy MD,  September 08, 2010 10:29 AM  Additional Follow-up for Phone Call Additional follow up Details #1::        Left vm for patient to check w/pharmacy Additional Follow-up by: Lamar Sprinkles, CMA,  September 08, 2010 10:56 AM    Prescriptions: TRAMADOL HCL 50 MG TABS (TRAMADOL HCL) 1 tab by mouth three times a day for severe knee pain  #30 x 5   Entered by:   Lamar Sprinkles, CMA   Authorized by:   Jacques Navy MD   Signed by:   Lamar Sprinkles, CMA on 09/08/2010   Method used:   Electronically to        [facility]  [address] [phone]* (retail)       [address]       [place], [place]  [id number]       Ph: [phone] or [phone]       Fax: [phone]   RxID:   [id number]

## 2010-11-11 NOTE — Letter (Signed)
[facility]   Imported By: Sherian Rein 08/07/2010 13:32:51  _____________________________________________________________________  External Attachment:    Type:   Image     Comment:   External Document

## 2010-11-11 NOTE — Consult Note (Signed)
Summary: [facility]  [facility]   Imported By: Lester Redfield 03/13/2010 10:33:25  _____________________________________________________________________  External Attachment:    Type:   Image     Comment:   External Document

## 2010-11-11 NOTE — Letter (Signed)
[facility]: [facility]   Imported By: Lester [place] 08/13/2010 10:06:01  _____________________________________________________________________  External Attachment:    Type:   Image     Comment:   External Document

## 2010-11-11 NOTE — Progress Notes (Signed)
Summary: Call Report  Phone Note Other Incoming   Caller: [facility] Call Report Summary of Call: Triage Call Report Triage Record Num: [id number] Operator: Freddie Breech Patient Name: Briana [name] Call Date & Time: 02/22/2010 8:03:26AM Patient Phone: [phone] PCP: Illene Regulus Patient Gender: Female PCP Fax : [phone] Patient DOB: [birth year] Practice Name: [facility] Reason for Call: C/o calf and knee pain w/ambulation ^ after OV on 02/20/10 for same. Rx muscle relaxant that has not relieved the pain. Advised see in 24 hrs. May call back at 0900 for an appt today. Protocol(s) Used: Lower Leg Non-Injury Recommended Outcome per Protocol: See Provider within 24 hours Reason for Outcome: New onset mild to moderate pain that has not improved with 24 hours of home care Care Advice:  ~ Call provider if symptoms worsen or new symptoms develop.  ~ SYMPTOM / CONDITION MANAGEMENT Position affected part so it is elevated at least 12 inches (30 cm) above level of heart to improve circulation and decrease discomfort.  ~ 02/22/2010 8:17:44AM Page 1 of 1 CAN Initial call taken by: Margaret Pyle, CMA,  Feb 24, 2010 8:26 AM

## 2010-11-11 NOTE — Letter (Signed)
Summary: Out of Work  [facility]  [address] [place], [place] [address]   Phone: [phone]  Fax: [phone]    Feb 20, 2010   Employee:  Briana [name] [facility]    To Whom It May Concern:   For Medical reasons, please excuse the above named employee from work for the following dates:  02/21/2010  If you need additional information, please feel free to contact our office.         Sincerely,    Lamar Sprinkles, CMA Duncan Dull) For Dr Judie Petit. Nai Borromeo

## 2010-11-11 NOTE — Op Note (Signed)
Summary: [facility]  [facility]   Imported By: Lennie Odor 07/15/2010 15:14:45  _____________________________________________________________________  External Attachment:    Type:   Image     Comment:   External Document

## 2010-11-11 NOTE — Letter (Signed)
Summary: Out of Work  [facility]  [address] [place], [place] [address]   Phone: [phone]  Fax: [phone]    September 02, 2010   Employee:  Briana [name] [facility]    To Whom It May Concern:   For Medical reasons, please excuse the above named employee from work for the following dates:  Start:   Tuesday, November 22  End:   Monday, November 28th  If you need additional information, please feel free to contact our office.         Sincerely,    Jacques Navy MD

## 2010-11-13 NOTE — Assessment & Plan Note (Signed)
Summary: sore hands, feet.cd   Vital Signs:  Patient profile:   77 year old female Height:      64 inches Weight:      170 pounds BMI:     29.29 O2 Sat:      96 % on Room air Temp:     97.8 degrees F oral Pulse rate:   61 / minute BP sitting:   122 / 70  (left arm) Cuff size:   regular  Vitals Entered By: Bill Salinas CMA (October 07, 2010 3:53 PM)  O2 Flow:  Room air CC: pt here with c/o soreness in hands and feet x 2 weeks/ ab   Primary Care Provider:  Norins  CC:  pt here with c/o soreness in hands and feet x 2 weeks/ ab.  History of Present Illness: Patient presents for a 2 week h/o increased pain in hands and wrists. she reports that it is hard to use her hands, lift objects. She also has pain in the neck which is worse than usual. She reports that she is taking meloxicam on a regular basis and has had diminished pain in the knees. She has not noticed any joint swelling or redness. she has not had any strain or injury. She has pain as well in the base of her toes. This has not improved with epson salts and soaks.   Patient has had a rash on the back of her left leg.   Current Medications (verified): 1)  Atenolol 100 Mg  Tabs (Atenolol) .... Take 1 Tablet By Mouth Every Morning 2)  Allegra 180 Mg  Tabs (Fexofenadine Hcl) .... As Needed 3)  Amlodipine Besylate 10 Mg  Tabs (Amlodipine Besylate) .Marland Kitchen.. 1 Once Daily 4)  Simvastatin 40 Mg  Tabs (Simvastatin) .Marland Kitchen.. 1 By Mouth Qpm 5)  Temazepam 15 Mg Caps (Temazepam) .... Take 1 Tab By Mouth At Bedtime As Needed 6)  Methocarbamol 500 Mg Tabs (Methocarbamol) .Marland Kitchen.. 1-2 Q6h As Needed Neck Pain 7)  Meloxicam 15 Mg Tabs (Meloxicam) .Marland Kitchen.. 1 By Mouth Once Daily 8)  Tramadol Hcl 50 Mg Tabs (Tramadol Hcl) .Marland Kitchen.. 1 Tab By Mouth Three Times A Day For Severe Knee Pain  Allergies (verified): 1)  ! Dilacor Xr 2)  ! Verapamil 3)  ! Lisinopril 4)  ! Hydrochlorothiazide  Past History:  Past Medical History: Last updated: 10/28/2007 Allergic  rhinitis Hyperlipidemia Hypertension ARTHRITIS, KNEES, BILATERAL (ICD-716.98)  Past Surgical History: Last updated: 10/31/2007 * ARTHROSCOPIC SURGERY AND ROTATOR CUFF REPAIR LEFT SHOULDER ARTHROSCOPY, RIGHT KNEE, HX OF SURGERY (ICD-V45.89) * HAND SURGERY FOR BROKEN FINGER, REMOTE BUNIONECTOMY, HX OF (ICD-V15.89)  G7P5  2 SABs   FH reviewed for relevance, SH/Risk Factors reviewed for relevance  Review of Systems       The patient complains of suspicious skin lesions.  The patient denies anorexia, fever, weight loss, decreased hearing, hoarseness, chest pain, syncope, peripheral edema, hemoptysis, abdominal pain, severe indigestion/heartburn, muscle weakness, unusual weight change, abnormal bleeding, and breast masses.    Physical Exam  General:  overweight AA female in no acute distress Head:  Normocephalic and atraumatic without obvious abnormalities. No apparent alopecia or balding. Eyes:  C&S clear Neck:  supple and full ROM.   Lungs:  normal respiratory effort and normal breath sounds.   Heart:  normal rate and regular rhythm.   Msk:  normal ROM.  Tender at the base of the 1st MCP both hands. No synovial thickening, redness or deformity of the hand joints. Tender at the  medial epicondyle with supination against resistence.  Pulses:  2+ radial Neurologic:  alert & oriented X3, cranial nerves II-XII intact, strength normal in all extremities, and gait normal.   Skin:  nummular lesions back of left leg at the ankle with clear center Cervical Nodes:  no anterior cervical adenopathy and no posterior cervical adenopathy.   Psych:  Oriented X3, normally interactive, and good eye contact.     Impression & Recommendations:  Problem # 1:  PAIN IN JOINT, SITE UNSPECIFIED (ICD-719.40) patient digffuse joint pain: hands, toes, elbows despite meloxicam.  Plan - will d/c meloxicam            start ibuprofen 600mg  three times a day . GI precautions  Problem # 2:  TINEA CORPORIS  (ICD-110.5) rash on back of legs c/w t. corporis  Plan - lamisil two times a day.   Complete Medication List: 1)  Atenolol 100 Mg Tabs (Atenolol) .... Take 1 tablet by mouth every morning 2)  Allegra 180 Mg Tabs (Fexofenadine hcl) .... As needed 3)  Amlodipine Besylate 10 Mg Tabs (Amlodipine besylate) .Marland Kitchen.. 1 once daily 4)  Simvastatin 40 Mg Tabs (Simvastatin) .Marland Kitchen.. 1 by mouth qpm 5)  Temazepam 15 Mg Caps (Temazepam) .... Take 1 tab by mouth at bedtime as needed 6)  Methocarbamol 500 Mg Tabs (Methocarbamol) .Marland Kitchen.. 1-2 q6h as needed neck pain 7)  Tramadol Hcl 50 Mg Tabs (Tramadol hcl) .Marland Kitchen.. 1 tab by mouth three times a day for severe knee pain 8)  Ibuprofen 600 Mg Tabs (Ibuprofen) .Marland Kitchen.. 1 by mouth three times a day for diffuse joint pain. gi precautions 9)  Lamisil At 1 % Crea (Terbinafine hcl) .... Apply two times a day to rash on leg  Patient Instructions: 1)  joint pain - appears to be diffuse arthritic type pain. Plan - stop meloxicam. Start ibuprofen 600mg  three times a day. Watch for stomach pain or change in bowels. 2)  Rash- looks like a fungal rash. Plan - otc lamisil apply twice a day to rash.  Prescriptions: IBUPROFEN 600 MG TABS (IBUPROFEN) 1 by mouth three times a day for diffuse joint pain. GI precautions  #100 x 6   Entered and Authorized by:   Jacques Navy MD   Signed by:   Jacques Navy MD on 10/07/2010   Method used:   Electronically to        [facility] [phone]* (retail)       [address]       [place], [place]  [address]       Ph: [phone]       Fax: [phone]   RxID:   [id number]    Orders Added: 1)  Est. Patient Level III [60109]

## 2010-11-13 NOTE — Progress Notes (Signed)
Summary: PAIN FROM MEDS?   Phone Note Call from Patient Call back at [facility] Phone [phone]   Summary of Call: Pt left vm, she stopped cholesterol med and her achyness has gotten better. What else can she take for cholesterol.  Initial call taken by: Lamar Sprinkles, CMA,  October 21, 2010 9:32 AM  Follow-up for Phone Call        trial of crestor 5mg  to see if better tolerated Follow-up by: Jacques Navy MD,  October 21, 2010 6:02 PM  Additional Follow-up for Phone Call Additional follow up Details #1::        Pt informed  Additional Follow-up by: Lamar Sprinkles, CMA,  October 22, 2010 2:55 PM    New/Updated Medications: CRESTOR 5 MG TABS (ROSUVASTATIN CALCIUM) 1 by mouth once daily Prescriptions: CRESTOR 5 MG TABS (ROSUVASTATIN CALCIUM) 1 by mouth once daily  #30 x 3   Entered by:   Lamar Sprinkles, CMA   Authorized by:   Jacques Navy MD   Signed by:   Lamar Sprinkles, CMA on 10/22/2010   Method used:   Electronically to        [facility] [phone]* (retail)       [address]       [place], [place]  [address]       Ph: [phone]       Fax: [phone]   RxID:   [id number]

## 2010-11-13 NOTE — Progress Notes (Signed)
Summary: HAND SWELLING  Phone Note Call from Patient Call back at [facility] Phone [phone]   Summary of Call: Pt c/o swelling and pain in her left hand. She is having trouble using her hand due to symptoms. Her toes have gotten somewhat better. She is following advice from office visit last week.  Initial call taken by: Lamar Sprinkles, CMA,  October 14, 2010 2:05 PM  Follow-up for Phone Call        pain with persistent swelling in the left hand along with pain and loss of grip. she is also having pain in the feet despite the use of ibuprofen 600mg  three times a day.  Plan - prednisone 20mg  once daily x 3, 10 mg once daily x 6, 5mg  once daily x 6 then off for acute joint inflammation. Will then resume  Follow-up by: Jacques Navy MD,  October 14, 2010 5:55 PM    New/Updated Medications: PREDNISONE 5 MG TABS (PREDNISONE) 4 tabs once daily x 3, 2 tabs once daily x 6, 1 tab once daily x 6 Prescriptions: PREDNISONE 5 MG TABS (PREDNISONE) 4 tabs once daily x 3, 2 tabs once daily x 6, 1 tab once daily x 6  #30 x 0   Entered and Authorized by:   Jacques Navy MD   Signed by:   Jacques Navy MD on 10/14/2010   Method used:   Electronically to        [facility] [phone]* (retail)       [address]       [place], [place]  [address]       Ph: [phone]       Fax: [phone]   RxID:   [id number]

## 2010-11-13 NOTE — Assessment & Plan Note (Signed)
Summary: HAND PAIN/NWS   Vital Signs:  Patient profile:   77 year old female Menstrual status:  postmenopausal Height:      64 inches Weight:      168 pounds BMI:     28.94 O2 Sat:      98 % on Room air Temp:     99.0 degrees F oral Pulse rate:   76 / minute Pulse rhythm:   regular Resp:     16 per minute BP sitting:   134 / 88  (left arm) Cuff size:   regular  Vitals Entered By: Lanier Prude, CMA(AAMA) (October 22, 2010 1:13 PM)  Nutrition Counseling: Patient's BMI is greater than 25 and therefore counseled on weight management options.  O2 Flow:  Room air CC: bilateral toe/foot pain and Lt hand pain/edema Is Patient Diabetic? No Comments pt is not taking Ibuprofen     Menstrual Status postmenopausal   Primary Care Provider:  Norins  CC:  bilateral toe/foot pain and Lt hand pain/edema.  History of Present Illness: New to me she complains of joint pains for one month- all over, but mostly in her left hand and wrist. She has diffuse pain in joints and diffuse muscle aches. She has not improved with nsaids, prednisone, or tramadol. She stopped Crestor without improvement.  Current Medications (verified): 1)  Atenolol 100 Mg  Tabs (Atenolol) .... Take 1 Tablet By Mouth Every Morning 2)  Allegra 180 Mg  Tabs (Fexofenadine Hcl) .... As Needed 3)  Amlodipine Besylate 10 Mg  Tabs (Amlodipine Besylate) .Marland Kitchen.. 1 Once Daily 4)  Crestor 5 Mg Tabs (Rosuvastatin Calcium) .Marland Kitchen.. 1 By Mouth Once Daily 5)  Temazepam 15 Mg Caps (Temazepam) .... Take 1 Tab By Mouth At Bedtime As Needed 6)  Methocarbamol 500 Mg Tabs (Methocarbamol) .Marland Kitchen.. 1-2 Q6h As Needed Neck Pain 7)  Tramadol Hcl 50 Mg Tabs (Tramadol Hcl) .Marland Kitchen.. 1 Tab By Mouth Three Times A Day For Severe Knee Pain 8)  Ibuprofen 600 Mg Tabs (Ibuprofen) .Marland Kitchen.. 1 By Mouth Three Times A Day For Diffuse Joint Pain. Gi Precautions 9)  Lamisil At 1 % Crea (Terbinafine Hcl) .... Apply Two Times A Day To Rash On Leg 10)  Prednisone 5 Mg Tabs  (Prednisone) .... 4 Tabs Once Daily X 3, 2 Tabs Once Daily X 6, 1 Tab Once Daily X 6  Allergies (verified): 1)  ! Dilacor Xr 2)  ! Verapamil 3)  ! Lisinopril 4)  ! Hydrochlorothiazide  Past History:  Past Medical History: Last updated: 10/28/2007 Allergic rhinitis Hyperlipidemia Hypertension ARTHRITIS, KNEES, BILATERAL (ICD-716.98)  Past Surgical History: Last updated: 11/24/07 * ARTHROSCOPIC SURGERY AND ROTATOR CUFF REPAIR LEFT SHOULDER ARTHROSCOPY, RIGHT KNEE, HX OF SURGERY (ICD-V45.89) * HAND SURGERY FOR BROKEN FINGER, REMOTE BUNIONECTOMY, HX OF (ICD-V15.89)  G7P5  2 SABs    Family History: Last updated: 11/24/2007 father - died 6's MI mother - died 58's HTN 3 siblings with CAD/MI 1 brother with lung cancer Neg - breast cancer  Social History: Last updated: 04/25/2010 HSG married '68 widowed '00 4 sons ; 1 daughter; 6 grandchildren; 3 great-grands 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  Risk Factors: Exercise: no (Nov 24, 2007)  Risk Factors: Smoking Status: never (November 24, 2007)  Family History: Reviewed history from November 24, 2007 and no changes required. father - died 36's MI mother - died 84's HTN 3 siblings with CAD/MI 1 brother with lung cancer Neg - breast cancer  Social History: Reviewed history from 04/25/2010 and no changes required. 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  Review of Systems  The patient denies anorexia, fever, weight loss, weight gain, chest pain, syncope, dyspnea on exertion, peripheral edema, prolonged cough, headaches, hemoptysis, abdominal pain, suspicious skin lesions, difficulty walking, enlarged lymph nodes, and angioedema.   MS:  Complains of joint pain, joint swelling, loss of strength, muscle aches, muscle weakness,  and stiffness; denies joint redness, low back pain, mid back pain, cramps, and thoracic pain.  Physical Exam  General:  alert, well-developed, well-nourished, well-hydrated, appropriate dress, normal appearance, healthy-appearing, cooperative to examination, and good hygiene.   Head:  normocephalic, atraumatic, no abnormalities observed, and no abnormalities palpated.   Eyes:  vision grossly intact and pupils equal.   Mouth:  Oral mucosa and oropharynx without lesions or exudates.  Teeth in good repair. Neck:  supple, full ROM, no masses, no thyromegaly, no thyroid nodules or tenderness, no JVD, normal carotid upstroke, no carotid bruits, no cervical lymphadenopathy, and no neck tenderness.   Lungs:  normal respiratory effort, no intercostal retractions, no accessory muscle use, normal breath sounds, no dullness, no fremitus, no crackles, and no wheezes.   Heart:  normal rate, regular rhythm, no murmur, no gallop, no rub, and no JVD.   Abdomen:  soft, non-tender, normal bowel sounds, no distention, no masses, no guarding, no rigidity, no rebound tenderness, no abdominal hernia, no inguinal hernia, no hepatomegaly, and no splenomegaly.   Msk:  she has mild diffuse swelling on the dorsum of her left hand and wrist and ttp in the wrist but no warmth, crepitance, induration, fluctuance, or streaking, she does have DJD changes but no acute synovitis. her small joints have no ttp, erythema, warmth, synovitis, but she does have large MCP and IP joints. Pulses:  R and L carotid,radial,femoral,dorsalis pedis and posterior tibial pulses are full and equal bilaterally Extremities:  No clubbing, cyanosis, edema, or deformity noted with normal full range of motion of all joints.   Neurologic:  No cranial nerve deficits noted. Station and gait are normal. Plantar reflexes are down-going bilaterally. DTRs are symmetrical throughout. Sensory, motor and coordinative functions appear intact. Skin:  Intact without  suspicious lesions or rashes Cervical Nodes:  no anterior cervical adenopathy and no posterior cervical adenopathy.   Axillary Nodes:  no R axillary adenopathy and no L axillary adenopathy.   Psych:  Cognition and judgment appear intact. Alert and cooperative with normal attention span and concentration. No apparent delusions, illusions, hallucinations   Impression & Recommendations:  Problem # 1:  WRIST PAIN, LEFT (EAV-409.81) Assessment New her xray shows DJD but her WBC and CRP are elevated so I am concerned about infection, will start Cipro Orders: T-Wrist Comp Left Min 3 Views (73110TC)  Problem # 2:  MYOSITIS (ICD-729.1) Assessment: New will check CPK level and Rheum factors and stop Crestor The following medications were removed from the medication list:    Tramadol Hcl 50 Mg Tabs (Tramadol hcl) .Marland Kitchen... 1 tab by mouth three times a day for severe knee pain Her updated medication list for this problem includes:    Methocarbamol 500 Mg Tabs (Methocarbamol) .Marland Kitchen... 1-2 q6h as needed neck pain    Ibuprofen 600 Mg Tabs (Ibuprofen) .Marland Kitchen... 1 by mouth three times a day for diffuse joint pain. gi precautions  Hydrocodone-acetaminophen 7.5-325 Mg Tabs (Hydrocodone-acetaminophen) ..... One by mouth qid as needed for pain  Orders: Venipuncture (46962) TLB-BMP (Basic Metabolic Panel-BMET) (80048-METABOL) TLB-CBC Platelet - w/Differential (85025-CBCD) TLB-Hepatic/Liver Function Pnl (80076-HEPATIC) TLB-TSH (Thyroid Stimulating Hormone) (84443-TSH) TLB-CK Total Only(Creatine Kinase/CPK) (82550-CK) TLB-CRP-High Sensitivity (C-Reactive Protein) (86140-FCRP) T-Antinuclear Antib (ANA) (95284-13244) T-Rheumatoid Factor [id number]) TLB-Uric Acid, Blood (84550-URIC) TLB-Sedimentation Rate (ESR) (85652-ESR)  Problem # 3:  ALLERGIC ARTHRITIS OTHER SPECIFIED SITES (YQI-347.42) Assessment: New  Orders: Venipuncture (59563) TLB-BMP (Basic Metabolic Panel-BMET) (80048-METABOL) TLB-CBC Platelet -  w/Differential (85025-CBCD) TLB-Hepatic/Liver Function Pnl (80076-HEPATIC) TLB-TSH (Thyroid Stimulating Hormone) (84443-TSH) TLB-CK Total Only(Creatine Kinase/CPK) (82550-CK) TLB-CRP-High Sensitivity (C-Reactive Protein) (86140-FCRP) T-Antinuclear Antib (ANA) (87564-33295) T-Rheumatoid Factor (18841-66063) TLB-Uric Acid, Blood (84550-URIC) TLB-Sedimentation Rate (ESR) (85652-ESR)  Problem # 4:  PAIN IN JOINT, SITE UNSPECIFIED (ICD-719.40) Assessment: Unchanged  Orders: Venipuncture (01601) TLB-BMP (Basic Metabolic Panel-BMET) (80048-METABOL) TLB-CBC Platelet - w/Differential (85025-CBCD) TLB-Hepatic/Liver Function Pnl (80076-HEPATIC) TLB-TSH (Thyroid Stimulating Hormone) (84443-TSH) TLB-CK Total Only(Creatine Kinase/CPK) (82550-CK) TLB-CRP-High Sensitivity (C-Reactive Protein) (86140-FCRP) T-Antinuclear Antib (ANA) (09323-55732) T-Rheumatoid Factor (20254-27062) TLB-Uric Acid, Blood (84550-URIC) TLB-Sedimentation Rate (ESR) (85652-ESR)  Problem # 5:  CELLULITIS&ABSCESS OF HAND EXCEPT FINGERS&THUMB (ICD-682.4) Assessment: Deteriorated  Her updated medication list for this problem includes:    Cipro 500 Mg Tab (Ciprofloxacin hcl) .Marland Kitchen... Take 1 tablet by mouth morning and night x 14 days  Problem # 6:  HYPERTENSION (ICD-401.9) Assessment: Improved  Her updated medication list for this problem includes:    Atenolol 100 Mg Tabs (Atenolol) .Marland Kitchen... Take 1 tablet by mouth every morning    Amlodipine Besylate 10 Mg Tabs (Amlodipine besylate) .Marland Kitchen... 1 once daily  Orders: Venipuncture (37628) TLB-BMP (Basic Metabolic Panel-BMET) (80048-METABOL) TLB-CBC Platelet - w/Differential (85025-CBCD) TLB-Hepatic/Liver Function Pnl (80076-HEPATIC) TLB-TSH (Thyroid Stimulating Hormone) (84443-TSH) TLB-CK Total Only(Creatine Kinase/CPK) (82550-CK) TLB-CRP-High Sensitivity (C-Reactive Protein) (86140-FCRP) T-Antinuclear Antib (ANA) (31517-61607) T-Rheumatoid Factor (37106-26948) TLB-Uric Acid,  Blood (84550-URIC) TLB-Sedimentation Rate (ESR) (85652-ESR)  BP today: 134/88 Prior BP: 122/70 (10/07/2010)  Labs Reviewed: K+: 4.1 (04/25/2010) Creat: : 0.7 (04/25/2010)   Chol: 234 (04/25/2010)   HDL: 110.60 (04/25/2010)   LDL: DEL (04/11/2008)   TG: 95.0 (04/25/2010)  Complete Medication List: 1)  Atenolol 100 Mg Tabs (Atenolol) .... Take 1 tablet by mouth every morning 2)  Allegra 180 Mg Tabs (Fexofenadine hcl) .... As needed 3)  Amlodipine Besylate 10 Mg Tabs (Amlodipine besylate) .Marland Kitchen.. 1 once daily 4)  Temazepam 15 Mg Caps (Temazepam) .... Take 1 tab by mouth at bedtime as needed 5)  Methocarbamol 500 Mg Tabs (Methocarbamol) .Marland Kitchen.. 1-2 q6h as needed neck pain 6)  Ibuprofen 600 Mg Tabs (Ibuprofen) .Marland Kitchen.. 1 by mouth three times a day for diffuse joint pain. gi precautions 7)  Lamisil At 1 % Crea (Terbinafine hcl) .... Apply two times a day to rash on leg 8)  Hydrocodone-acetaminophen 7.5-325 Mg Tabs (Hydrocodone-acetaminophen) .... One by mouth qid as needed for pain 9)  Cipro 500 Mg Tab (Ciprofloxacin hcl) .... Take 1 tablet by mouth morning and night x 14 days  Patient Instructions: 1)  Please schedule a follow-up appointment in 2 weeks. Prescriptions: CIPRO 500 MG TAB (CIPROFLOXACIN HCL) Take 1 tablet by mouth morning and night X 14 days  #28 x 1   Entered and Authorized by:   Etta Grandchild MD   Signed by:   Etta Grandchild MD on 10/23/2010   Method used:   Printed then faxed to ...       [facility] (retail)  [address]       [place], [place]  [address]       Ph: [phone]       Fax: [phone]   RxID:   [id number] HYDROCODONE-ACETAMINOPHEN 7.5-325 MG TABS (HYDROCODONE-ACETAMINOPHEN) One by mouth QID as needed for pain  #50 x 2   Entered and Authorized by:   Etta Grandchild MD   Signed by:   Etta Grandchild MD on 10/22/2010   Method used:   Print then Give to Patient   RxID:    [id number]    Orders Added: 1)  Venipuncture [64403] 2)  TLB-BMP (Basic Metabolic Panel-BMET) [80048-METABOL] 3)  TLB-CBC Platelet - w/Differential [85025-CBCD] 4)  TLB-Hepatic/Liver Function Pnl [80076-HEPATIC] 5)  TLB-TSH (Thyroid Stimulating Hormone) [84443-TSH] 6)  TLB-CK Total Only(Creatine Kinase/CPK) [82550-CK] 7)  TLB-CRP-High Sensitivity (C-Reactive Protein) [86140-FCRP] 8)  T-Antinuclear Antib (ANA) [47425-95638] 9)  T-Rheumatoid Factor [75643-32951] 10)  TLB-Uric Acid, Blood [84550-URIC] 11)  TLB-Sedimentation Rate (ESR) [85652-ESR] 12)  T-Wrist Comp Left Min 3 Views [73110TC] 13)  Est. Patient Level IV [88416]

## 2010-11-13 NOTE — Progress Notes (Signed)
Summary: Sore FEET  Phone Note Call from Patient Call back at [facility] Phone [phone]   Summary of Call: Pt c/o soreness on the bottom of both feet especially when walking. Patient is requesting advice from MD.  Initial call taken by: Lamar Sprinkles, CMA,  September 22, 2010 6:23 PM  Follow-up for Phone Call        called patient: painful feet with bearing weight. Whole foot hurts. no lesions. taking meloxicam once daily. taking APAP three times a day. if pain continues try soaking in warm water, try rubbing them down with aspercreme. If nothing helps call for appointment. Follow-up by: Jacques Navy MD,  September 22, 2010 6:35 PM

## 2010-11-13 NOTE — Progress Notes (Signed)
Summary: PAIN   Phone Note Outgoing Call   Summary of Call: LA - please let her know that the blood work shows that she may have a joint infection so I have sent an Rx for anitbiotics to her pharmacy and ordered an ortho referral  TJ Initial call taken by: Etta Grandchild MD,  October 23, 2010 8:54 AM  Follow-up for Phone Call        Pt informed of cipro rx, she continues to have pain -kept her up all night. Hydrocodone has given little relief.   (ANA has not been resulted yet) Follow-up by: Lamar Sprinkles, CMA,  October 23, 2010 9:18 AM  Additional Follow-up for Phone Call Additional follow up Details #1::        reviewed all labs: neg RA, elevated CRP and ESR, normal Uric acid.  For infections finish cipro for pain take ibuprofen 600mg  three times a day. Additional Follow-up by: Jacques Navy MD,  October 23, 2010 9:26 AM  New Problems: CELLULITIS&ABSCESS OF HAND EXCEPT FINGERS&THUMB (ICD-682.4)   Additional Follow-up for Phone Call Additional follow up Details #2::    Pt informed  Follow-up by: Lamar Sprinkles, CMA,  October 23, 2010 4:38 PM  New Problems: CELLULITIS&ABSCESS OF HAND EXCEPT FINGERS&THUMB (ICD-682.4) Prescriptions: CIPRO 500 MG TAB (CIPROFLOXACIN HCL) Take 1 tablet by mouth morning and night X 14 days  #28 x 1   Entered by:   Lamar Sprinkles, CMA   Authorized by:   Etta Grandchild MD   Signed by:   Lamar Sprinkles, CMA on 10/23/2010   Method used:   Electronically to        [facility] [phone]* (retail)       [address]       [place], [place]  [address]       Ph: [phone]       Fax: [phone]   RxID:   [id number]

## 2010-11-13 NOTE — Letter (Signed)
[facility]   Imported By: Sherian Rein 11/06/2010 11:14:43  _____________________________________________________________________  External Attachment:    Type:   Image     Comment:   External Document

## 2010-11-14 ENCOUNTER — Telehealth: Payer: Self-pay | Admitting: Internal Medicine

## 2010-11-14 NOTE — Letter (Signed)
Summary: [facility]   Imported By: Sherian Rein 06/03/2010 09:36:45  _____________________________________________________________________  External Attachment:    Type:   Image     Comment:   External Document

## 2010-11-19 NOTE — Progress Notes (Signed)
Summary: med or ov  Phone Note Call from Patient Call back at Home Phone [phone]   Caller: Patient--[phone] Call For: Jacques Navy MD Summary of Call: Pt states both feet hurt, does she need meds or office visit? Initial call taken by: Verdell Face,  November 14, 2010 8:04 AM  Follow-up for Phone Call        No office visit apt avail today - Please advise.  Follow-up by: Lamar Sprinkles, CMA,  November 14, 2010 9:00 AM  Additional Follow-up for Phone Call Additional follow up Details #1::        Reviewed my note, Dr. Yetta Barre note. Lab with elevated esr and CRP. 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 Hand sugeon did another round of steroids. Other arthritidies not ruled out.  Plan - hydorcodone/APAP 5/325 1 or 2 every 6 hours         refer to Dr. Nickola Major for rheum.  [facility] notified Additional Follow-up by: Jacques Navy MD,  November 14, 2010 12:39 PM    Additional Follow-up for Phone Call Additional follow up Details #2::    Pt informed - she already has hydrocodone previously rx'd from Dr Yetta Barre, has rheum apt 2/23 Follow-up by: Lamar Sprinkles, CMA,  November 14, 2010 5:17 PM

## 2010-11-27 ENCOUNTER — Encounter: Payer: Self-pay | Admitting: Internal Medicine

## 2010-12-11 ENCOUNTER — Encounter: Payer: Self-pay | Admitting: Internal Medicine

## 2010-12-18 NOTE — Consult Note (Signed)
Summary: Zenovia Jordan MD/[facility] Fitch MD/[facility]   Imported By: Lester Winchester 12/02/2010 08:02:42  _____________________________________________________________________  External Attachment:    Type:   Image     Comment:   External Document

## 2010-12-23 NOTE — Letter (Signed)
Summary: [facility]   Imported By: Sherian Rein 12/16/2010 14:28:56  _____________________________________________________________________  External Attachment:    Type:   Image     Comment:   External Document

## 2010-12-26 ENCOUNTER — Telehealth: Payer: Self-pay | Admitting: Internal Medicine

## 2011-01-08 NOTE — Progress Notes (Signed)
Summary: Rx inquiry  Phone Note Call from Patient Call back at Home Phone [phone]   Caller: Patient Summary of Call: Pt states that she was removed from Crestor for pain side effects that she believes to be coming from her rheumatoid health issues.  Pt states that she cannot afford the Crestor because it was costing $150 to refill  Pt is requesting a new Rx for Simvastatin or an equivalent med that is cost wise. Please Advise. Initial call taken by: Burnard Leigh [facility]),  December 26, 2010 3:58 PM  Follow-up for Phone Call        ok to restart simvatatin 40mg  sig  1 qPM, #30 refill as needed. Lab in 4 weeks - lipid 272.4 Follow-up by: Jacques Navy MD,  December 26, 2010 5:01 PM  Additional Follow-up for Phone Call Additional follow up Details #1::        left mess to call office back, need pharm info..........Marland KitchenLamar Sprinkles, CMA  December 29, 2010 4:16 PM     Additional Follow-up for Phone Call Additional follow up Details #2::    [facility] [address] [facility] [address]  [phone].  OFF [address].  New/Updated Medications: SIMVASTATIN 40 MG TABS (SIMVASTATIN) once daily, needs labs in 4 wks Prescriptions: SIMVASTATIN 40 MG TABS (SIMVASTATIN) once daily, needs labs in 4 wks  #30 x 0   Entered by:   Lamar Sprinkles, CMA   Authorized by:   Jacques Navy MD   Signed by:   Lamar Sprinkles, CMA on 12/29/2010   Method used:   Electronically to        [facility] [phone]* (retail)       [address]       [place], [place]  [address]       Ph: [phone]       Fax: [phone]   RxID:   [id number]

## 2011-02-24 NOTE — Assessment & Plan Note (Signed)
[facility]                           PRIMARY CARE OFFICE NOTE   NAME:Briana [name], Briana [name]                      MRN:          [id number]  DATE:03/21/2007                            DOB:          11-Mar-1931    REASON FOR VISIT:  Briana [name] is a 77 year old African-American woman  who presents to establish ongoing continuity of care.  She formerly had  been followed by Dr. Elias [name] but because of insurance coverage, she  has been transferred to our care.  She has no active complaints at  today's visit.   PAST MEDICAL HISTORY:  1. Usual childhood diseases including whooping cough.  2. Hypertension x23 years.  3. Hyperlipidemia x5 years.  4. Hay fever and allergies.  5. Arthritis for 10 years, most effecting her knees.   PAST SURGICAL HISTORY:  1. Bunionectomy, remote.  2. Hand surgery for broken finger, remote.  3. Arthroscopic surgery right knee.  4. Arthroscopic surgery and rotator cuff repair left shoulder in 2007.   OBSTETRICAL/GYN HISTORY:  Gravida 7, para 5, with two SAB's.   CURRENT MEDICATIONS:  1. Atenolol 50 mg b.i.d.  2. Caduet 10/40 one-half tablet daily.  3. Diclofenac 75 mg b.i.d. p.r.n.  4. Fexofenadine 180 mg daily p.r.n.   HABITS:  Tobacco:  None.  Alcohol:  None.   FAMILY HISTORY:  Mother died at age 75 of an MI.  She had hypertension.  Father died in his 57's of an MI.  The patient has two living brothers,  one with knee problems.  Three brothers are deceased - two with MI's and  one with lung cancer.  The patient has three sisters - one with coronary  artery disease, the second with unspecified heart disease, a third with  GYN-type cancer.  The patient has a son who had a closed head injury in  the service with post-traumatic changes, also with diabetes.   SOCIAL HISTORY:  The patient finished the 11th grade.  She was married  for 44 years but separated in the last years of her marriage.  Her  husband passed away 8  years ago.  The patient has four sons, one  daughter, six grandchildren, one great grandchild.  She continues to  work part time in [employer].  She has her own  home and her son lives with her.   HEALTH MAINTENANCE:  The patient's last Pap smear was in 2007 and she  has had a series of normal Pap smears.  Her last mammogram was in May of  2007 which was normal with followup study scheduled for August of this  year.   CHART REVIEW:  The patient's old records were reviewed.  She had an EKG  performed June 22, 2002 which was normal.  The patient had  bilateral knee film January 20, 2007 which showed osteoarthritic changes  with the right knee with osteophyte formation.  Last mammogram was  reviewed and was normal.  Chart indicates the patient had a flexible  sigmoidoscopy in 2002.   REVIEW OF SYSTEMS:  CONSTITUTIONAL:  The patient has had  no fevers,  chills, sweats or other constitutional problems.  HEENT:  She has had an  eye exam in the last year and does have an early cataract by her report.  The patient has full dentures with no problems with fit.  CARDIOVASCULAR/RESPIRATORY:  No cardiovascular or respiratory complaints  except for mild wheezing on a rare occasion.  GI:  The patient has  occasional constipation.  No GU, MUSCULOSKELETAL, DERMATOLOGIC  complaints.   PHYSICAL EXAMINATION:  VITAL SIGNS:  On limited exam the patient's  temperature was 97.1, blood pressure 144/82, pulse 69, respirations were  14 and regular.  Weight is 180 pounds.  GENERAL APPEARANCE: A well-nourished, well-developed African-American  woman looking younger than her stated chronologic age.  HEENT:  Normocephalic, atraumatic, EAC's with some cerumen but was able  to visualize TM's bilaterally.  Oropharynx revealed the patient to have  full dentures in place. No buccal lesions were noted.  The posterior  pharynx was clear.  Conjunctivae and sclerae were clear.  NECK:  Supple  without thyromegaly.  No lymphadenopathy was noted in the  cervical, supraclavicular regions.  CHEST:  No costovertebral angle tenderness. Lungs were clear to  auscultation and percussion.  BREASTS:  Exam deferred.  CARDIOVASCULAR: 2+ radial pulses, no jugular venous distention or  carotid bruits.  She has a quiet precordium with regular rate and rhythm  without murmurs, rubs or gallops.  ABDOMEN:  Soft, no guarding or rebound.  No organosplenomegaly was  appreciated.  PELVIC/RECTAL:  Exam's deferred.  EXTREMITIES:  Without cyanosis, clubbing or edema.  No deformities were  noted.  Skin was clear.   LABORATORY DATA:  I have asked the patient to obtain copies of her  laboratory from [facility] where she was enrolled in a drug study.  We  will supplement with whatever labs are missing from this routine panel.   ASSESSMENT/PLAN:  1. Hypertension.  The patient's blood pressure is adequately      controlled at this time.  2. Lipids.  Will await laboratory from [facility].  If she has not      had a lipid panel she would be due for the same.  3. Health maintenance.  The patient has had a series of normal Pap      smears so would not recommend repeat Pap smears unless she has      significant problems.  She should have a pelvic exam in five years.      The patient is due to colorectal cancer screening and should have      colonoscopy.  Will discuss this with her at her next visit.   DISCUSSION:  In summary, this is a very pleasant woman who presents to  establish for ongoing care.  She seems medically stable.  She will  return to see me on a p.r.n. basis.     Briana Gess Norins, MD  Electronically Signed    MEN/MedQ  DD: 03/22/2007  DT: 03/22/2007  Job #: [id number]

## 2011-02-27 NOTE — Op Note (Signed)
Briana [name], Briana [name]               ACCOUNT NO.:  [id number]   MEDICAL RECORD NO.:  [id number]          PATIENT TYPE:  AMB   LOCATION:  DSC                          FACILITY:  [facility]   PHYSICIAN:  Katy Fitch. Sypher, M.D. DATE OF BIRTH:  26-Jul-1931   DATE OF PROCEDURE:  04/01/2006  DATE OF DISCHARGE:                                 OPERATIVE REPORT   PREOPERATIVE DIAGNOSES:  1.  Chronic acromioclavicular degenerative arthritis, left shoulder.  2.  Chronic stage II or III impingement with MRI evidence of calcific      tendinopathy of supraspinatus tendon.  3.  Rule out rotator cuff tear.   POSTOPERATIVE DIAGNOSES:  1.  Severe acromioclavicular degenerative arthritis with inferior projecting      osteophytes at medial acromion and distal clavicle.  2.  Calcific tendinopathy with multiple full-thickness rotator cuff tear      areas involving the distal and intermediate supraspinatus tendon.  3.  Chronic degenerative changes of anterior and superior labrum with      calcific deposits.  4.  Full-thickness chondromalacia of central glenoid and grade II and III      chondromalacia of humeral head, left shoulder.   OPERATION:  1.  Examination of left shoulder under anesthesia.  2.  Diagnostic arthroscopy, left shoulder, followed by arthroscopic      debridement of degenerative labral fragments and calcific deposits      within the labrum.  3.  Arthroscopic debridement of deep surface rotator cuff identifying      multiple areas of full-thickness tearing.  4.  Subacromial decompression with acromioplasty, bursectomy and      coracoacromial ligament release.  5.  Open resection of left distal clavicle.  6.  Reconstruction of supraspinatus tendon with resection of calcific      tendinopathy, debridement, decortication of greater tuberosity and      repair utilizing two Biocorkscrew 5.5-mm anchors to create a medial foot      print and a McLaughlin through bone suture restoring the  supraspinatus      to its anatomic insertion.   OPERATIONS:  Briana [name], M.D.   ASSISTANT:  Briana [name], P.A.-C.   ANESTHESIA:  General endotracheal supplemented by left interscalene block,  supervising anesthesiologist Dr. Gypsy Balsam.   INDICATIONS:  Briana [name] is a 77 year old woman referred through the  courtesy of Dr. Hinda Lenis for evaluation and management of a painful left  shoulder.   She was initially evaluated in November and December 2006 and noted to have  signs of AC arthropathy on plain films, weakness of rotator cuff and chronic  stage II or III impingement.  She was sent for an MRI on September 14, 2005  which revealed extensive tendinopathy of the supraspinatus tendon, calcific  tendinopathy, some irregular areas in the anterior and superior labrum and a  severely degenerative AC joint.   We advised Briana [name] to proceed with subacromial decompression, distal  clavicle resection, joint debridement and possible rotator cuff repair.   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   She presents to the operating room at this time anticipating the  aforementioned procedures.   In the holding area preoperatively, I advised her should we find that the  rotator cuff was degenerative and/or torn we would proceed with repair at  this time.  Preoperatively, questions were invited and answered with family  members including a son and daughter.   After informed consent, she was brought to the operating room at this time.   PROCEDURE:  Briana [name] was brought to the operating room and placed in  supine position on the operating table.   Following the induction of general endotracheal anesthesia, she was  carefully positioned beach-chair position with the aid of the torso and head  holder designed for shoulder arthroscopy.   One gram of Ancef was administered as IV prophylactic antibiotic.   The  procedure commenced with examination of her left shoulder under  anesthesia.  She was noted have no signs adhesive capsulitis.  There was  marked crepitation beneath the [facility] joint due to irregularities of her bursa  and rotator cuff.   The shoulder was distended with 20 mL of sterile saline followed by  introduction of the arthroscope through a standard posterior viewing portal.   Diagnostic arthroscopy revealed intact hyaline articular cartilage surfaces  on the humeral head over superior and anterior portions.  Its central and  posterior and inferior portions had grade II and III chondromalacia.  The  glenoid had a full-thickness area of cartilage loss that was approximately  10% of the glenoid surface.  The labrum anteriorly and superiorly was  degenerative, fragmented and calcified.   We attempted to document these findings with the digital camera.   Due to technical difficulties with our camera system, while we were able to  easily visualize these on our live television view, our digital pictures are  all overexposed rendering interpretation quite challenging.   We have communicated with [facility] regarding these issues and are  waiting a technical repair so that we may properly document our patient's  pathology.   The suction shaver was introduced anteriorly under direct vision followed by  debridement of the labrum, deep surface rotator cuff and redundant synovium.  The deep surface of the cuff was notable for several patchy areas of full-  thickness injury in the supraspinatus with fragmentation of the tendon and  calcific deposits.   The scope was then removed the glenohumeral joint and placed in subacromial  space.  There was a florid bursitis noted.  After bursectomy, the anatomy  the coracoacromial arch was studied.  The clavicle was quite prominent.  This was documented with a digital camera.  The acromion was exposed by use of a cutting cautery to release the  coracoacromial ligament and a suction  shaver to debride bursa.  The acromion was leveled to a type 1 morphology  with a suction shaver followed by attempted distal clavicle resection  arthroscopically.  Due to calcification of the coracoacromial ligament and  the shape of the clavicle, this was rather challenging.  Therefore, I  converted directly to open resection distal clavicle and direct inspection  of the cuff.  The distal 15-mm clavicle was exposed subperiosteally with a  15 blade and small osteotome, followed by use of an oscillating saw to  remove the bone fragment.  The medial osteophyte on the acromion was removed  with an oscillating saw and small osteotome, followed by use of a curette.  By digital palpation, I  could palpate two holes in the rotator cuff and  multiple calcific deposits.  Therefore, we released the anterior third of  the deltoid, completed the bursectomy and identified a Swiss-cheese  appearance of the cuff with areas of full-thickness calcification.   The calcified tendon was resected, followed by debridement of the lateral  tear by use of a power bur to decorticate the greater tuberosity from the  biceps tendon anteriorly approximately 2 cm posteriorly.  A baseball-type  stitch was placed in the supraspinatus to converged the margins of the  resected calcific tendinopathy followed by use of McLaughlin through bone  suture technique to advance supraspinatus to an anatomic position laterally.   Two Biocorkscrew anchors were placed at the medial aspect of the  supraspinatus and the supraspinatus/infraspinatus junction posteriorly,  followed by placement of 4 mattress sutures insetting the rotator cuff  anatomically to the decorticated greater tuberosity.   All sutures were tied with excellent inset.  The power bur was then used to  feather the edge of the acromion to create a very rounded surface to prevent  secondary impingement.   The bursa was thoroughly  irrigated with power irrigation utilizing the  arthroscope and pump.   The deltoid was then repaired to the trapezius closing the dead space  created by distal clavicle resection followed by repair the anterior third  of the deltoid to the acromion, taking care to avoid impingement anteriorly.  The deltoid split was repaired with simple suture of 0 Vicryl.   There was repaired subdermal suture of 2-0 Vicryl and intradermal 3-0  Prolene with Steri-Strips.  There were no apparent complications.   Ms. Baldonado had an excellent block placed by Dr. Gypsy Balsam in the holding area.  Therefore, no Marcaine was provided for the portals.   She was awakened from general anesthesia, transferred to the recovery room  with stable signs.  There were no apparent complications.      Katy Fitch Sypher, M.D.  Electronically Signed     RVS/MEDQ  D:  04/01/2006  T:  04/01/2006  Job:  [id number]

## 2011-03-02 ENCOUNTER — Ambulatory Visit: Payer: Medicare PPO

## 2011-03-02 ENCOUNTER — Telehealth: Payer: Self-pay | Admitting: *Deleted

## 2011-03-02 ENCOUNTER — Other Ambulatory Visit (INDEPENDENT_AMBULATORY_CARE_PROVIDER_SITE_OTHER): Payer: Medicare PPO

## 2011-03-02 VITALS — BP 136/68

## 2011-03-02 DIAGNOSIS — E785 Hyperlipidemia, unspecified: Secondary | ICD-10-CM

## 2011-03-02 DIAGNOSIS — T887XXA Unspecified adverse effect of drug or medicament, initial encounter: Secondary | ICD-10-CM

## 2011-03-02 DIAGNOSIS — I10 Essential (primary) hypertension: Secondary | ICD-10-CM

## 2011-03-02 LAB — BASIC METABOLIC PANEL
BUN: 16 mg/dL (ref 6–23)
Calcium: 9.3 mg/dL (ref 8.4–10.5)
Creatinine, Ser: 0.9 mg/dL (ref 0.4–1.2)
Glucose, Bld: 118 mg/dL — ABNORMAL HIGH (ref 70–99)
Potassium: 3.9 mEq/L (ref 3.5–5.1)
Sodium: 140 mEq/L (ref 135–145)

## 2011-03-02 LAB — LIPID PANEL
Cholesterol: 192 mg/dL (ref 0–200)
LDL Cholesterol: 41 mg/dL (ref 0–99)
Total CHOL/HDL Ratio: 1
Triglycerides: 57 mg/dL (ref 0.0–149.0)

## 2011-03-02 LAB — HEPATIC FUNCTION PANEL
Albumin: 3.6 g/dL (ref 3.5–5.2)
Bilirubin, Direct: 0.1 mg/dL (ref 0.0–0.3)
Total Bilirubin: 1 mg/dL (ref 0.3–1.2)

## 2011-03-02 NOTE — Telephone Encounter (Signed)
1.Pt is worried about elevated BP - Sunday at church was 140/60, at home this am 146/75. She is already scheduled for nurse visit today - to bring home meter.  2. Wants labs - She is due for lipid panel due to changing from crestor to simvastatin in March. Pt would also like blood sugar checked, please advise.

## 2011-03-02 NOTE — Telephone Encounter (Signed)
Orders entered

## 2011-03-02 NOTE — Telephone Encounter (Signed)
Ok for lipid panel 272.4, hepatic 995.20, metabolic 401.9

## 2011-03-05 ENCOUNTER — Telehealth: Payer: Self-pay | Admitting: *Deleted

## 2011-03-05 NOTE — Telephone Encounter (Signed)
Patient informed. 

## 2011-03-05 NOTE — Telephone Encounter (Signed)
Patient requesting results of labs.  

## 2011-03-05 NOTE — Telephone Encounter (Signed)
Ok - with phone report no letter to be sent: chemistry was normal with glucose of 118 - needs to be prudent about carbs and sugar; cholesterol 197 - nl, HDL 139.7 - fabulously high and very protective, LDL 41.

## 2011-03-31 ENCOUNTER — Telehealth: Payer: Self-pay | Admitting: *Deleted

## 2011-03-31 NOTE — Telephone Encounter (Signed)
Called patient - ok to have cut dose in half. Will need lipid panel in 4-8 weeks.

## 2011-03-31 NOTE — Telephone Encounter (Signed)
Pt states that she is cutting her cholesterol medication in half, due to problems she was having w/sleep and would like to know if she needs a OV to discuss this.?

## 2011-04-03 ENCOUNTER — Encounter: Payer: Self-pay | Admitting: Internal Medicine

## 2011-04-16 ENCOUNTER — Telehealth: Payer: Self-pay | Admitting: *Deleted

## 2011-04-16 MED ORDER — NABUMETONE 500 MG PO TABS: 500.0000 mg | ORAL_TABLET | Freq: Every evening | ORAL | Status: DC | PRN

## 2011-04-16 NOTE — Telephone Encounter (Signed)
Pt has taken a couple nabumetone 500 mg - given to pt from her friend, for knee pain. This has helped and she would like RX from MD if this med is ok to take w/her other meds.

## 2011-04-16 NOTE — Telephone Encounter (Signed)
Yep, pardner, that there is good medicine. Nabumetone 500mg  2 tabs at betdime

## 2011-04-16 NOTE — Telephone Encounter (Signed)
Patient informed. 

## 2011-04-20 ENCOUNTER — Other Ambulatory Visit: Payer: Self-pay | Admitting: Internal Medicine

## 2011-04-20 NOTE — Telephone Encounter (Signed)
Rx Done . 

## 2011-04-30 ENCOUNTER — Other Ambulatory Visit: Payer: Self-pay | Admitting: Internal Medicine

## 2011-04-30 ENCOUNTER — Ambulatory Visit: Payer: Self-pay | Admitting: Internal Medicine

## 2011-05-24 ENCOUNTER — Other Ambulatory Visit: Payer: Self-pay | Admitting: Internal Medicine

## 2011-05-25 NOTE — Telephone Encounter (Signed)
Ok to refill x 5 

## 2011-05-25 NOTE — Telephone Encounter (Signed)
Please advise 

## 2011-05-27 ENCOUNTER — Ambulatory Visit (INDEPENDENT_AMBULATORY_CARE_PROVIDER_SITE_OTHER): Payer: Medicare PPO | Admitting: Internal Medicine

## 2011-05-27 VITALS — BP 150/80 | HR 54 | Temp 97.5°F | Wt 171.0 lb

## 2011-05-27 DIAGNOSIS — Z0000 Encounter for general adult medical examination without abnormal findings: Secondary | ICD-10-CM

## 2011-05-27 DIAGNOSIS — Z136 Encounter for screening for cardiovascular disorders: Secondary | ICD-10-CM

## 2011-05-27 NOTE — Progress Notes (Signed)
  Subjective:    Patient ID: Briana [name], female    DOB: 07/05/1931, 77 y.o.   MRN: [id number]  HPI  The patient is here for annual Medicare wellness examination and management of other chronic and acute problems.   The risk factors are reflected in the social history.  The roster of all physicians providing medical care to patient - is listed in the Snapshot section of the chart.  Activities of daily living:  The patient is 100% inedpendent in all ADLs: dressing, toileting, feeding as well as independent mobility  Home safety : The patient has smoke detectors in the home. They wear seatbelts. firearms at home ( firearms are present in the home, kept in a safe fashion). There is no violence in the home.   There is no risks for hepatitis, STDs or HIV. There is no   history of blood transfusion. They have no travel history to infectious disease endemic areas of the world.  The patient has not  seen their dentist in the last six month. They have not seen their eye doctor in the last year. They deny any hearing difficulty and have not had audiologic testing in the last year.  They do not  have excessive sun exposure. Discussed the need for sun protection: hats, long sleeves and use of sunscreen if there is significant sun exposure.   Diet: the importance of a healthy diet is discussed. They do have a healthy (unhealthy-high fat/fast food) diet.  The patient has a regular exercise program: Water Aerobic , 1 hour duration, 3 times per week.  The benefits of regular aerobic exercise were discussed.  Depression screen: there are no signs or vegative symptoms of depression- irritability, change in appetite, anhedonia, sadness/tearfullness.  Cognitive assessment: the patient manages all their financial and personal affairs and is actively engaged. They could relate day,date,year and events; recalled 3/3 objects at 3 minutes; performed clock-face test normally.  The following portions of the  patient's history were reviewed and updated as appropriate: allergies, current medications, past family history, past medical history,  past surgical history, past social history  and problem list.  Vision, hearing, body mass index were assessed and reviewed.   During the course of the visit the patient was educated and counseled about appropriate screening and preventive services including : fall prevention , diabetes screening, nutrition counseling, colorectal cancer screening, and recommended immunizations.    Review of Systems     Objective:   Physical Exam        Assessment & Plan:

## 2011-05-29 ENCOUNTER — Telehealth: Payer: Self-pay | Admitting: *Deleted

## 2011-05-29 DIAGNOSIS — M129 Arthropathy, unspecified: Secondary | ICD-10-CM

## 2011-05-29 NOTE — Telephone Encounter (Signed)
Pt left vm req status of knee referral. I do not see referral ordered.

## 2011-05-29 NOTE — Telephone Encounter (Signed)
Yep - note still open and referral is being put in now to Dr. Wilfred Curtis with [facility]

## 2011-06-01 ENCOUNTER — Telehealth: Payer: Self-pay

## 2011-06-01 NOTE — Telephone Encounter (Signed)
Pt called requesting referral to Ortho be cancelled.

## 2011-06-01 NOTE — Telephone Encounter (Signed)
OK - please notifiy [facility]

## 2011-09-14 ENCOUNTER — Telehealth: Payer: Self-pay | Admitting: *Deleted

## 2011-09-14 DIAGNOSIS — M79672 Pain in left foot: Secondary | ICD-10-CM

## 2011-09-14 NOTE — Telephone Encounter (Signed)
Pt states that she needs Referral for her left foot that takes [employer] insurance--she has found Dr Annell [facility] @ [address] [phone] if you will approve & give referral.

## 2011-09-14 NOTE — Telephone Encounter (Signed)
Order entered for referral to Dr. Ophelia Charter

## 2011-09-15 NOTE — Telephone Encounter (Signed)
Patient Informed

## 2011-10-20 ENCOUNTER — Other Ambulatory Visit: Payer: Self-pay | Admitting: Internal Medicine

## 2011-10-20 MED ORDER — AMLODIPINE BESYLATE 10 MG PO TABS: 10.0000 mg | ORAL_TABLET | Freq: Every day | ORAL | Status: DC

## 2011-10-20 NOTE — Telephone Encounter (Signed)
Done

## 2011-10-20 NOTE — Telephone Encounter (Signed)
Per pt she is out of blood pressure med amloditine 10mg ---[facility] [phone]

## 2011-11-27 ENCOUNTER — Other Ambulatory Visit: Payer: Self-pay | Admitting: Internal Medicine

## 2011-12-01 ENCOUNTER — Other Ambulatory Visit: Payer: Self-pay | Admitting: *Deleted

## 2011-12-01 MED ORDER — SIMVASTATIN 40 MG PO TABS: ORAL_TABLET | ORAL | Status: DC

## 2011-12-15 ENCOUNTER — Other Ambulatory Visit: Payer: Self-pay | Admitting: Internal Medicine

## 2011-12-22 ENCOUNTER — Encounter: Payer: Self-pay | Admitting: Internal Medicine

## 2011-12-22 ENCOUNTER — Ambulatory Visit (INDEPENDENT_AMBULATORY_CARE_PROVIDER_SITE_OTHER): Payer: Medicare PPO | Admitting: Internal Medicine

## 2011-12-22 VITALS — BP 118/70 | HR 68 | Temp 98.8°F | Resp 16 | Wt 170.2 lb

## 2011-12-22 DIAGNOSIS — J069 Acute upper respiratory infection, unspecified: Secondary | ICD-10-CM

## 2011-12-22 MED ORDER — TEMAZEPAM 15 MG PO CAPS: 15.0000 mg | ORAL_CAPSULE | Freq: Every evening | ORAL | Status: DC | PRN

## 2011-12-22 MED ORDER — ATENOLOL 100 MG PO TABS: 100.0000 mg | ORAL_TABLET | Freq: Every day | ORAL | Status: DC

## 2011-12-22 NOTE — Patient Instructions (Signed)
Viral upper respiratory infection with sore throat and cough. No sign of a bacterial infection, thus no indication for antibiotics. Plan - robitussin DM or the generic equivalent ( very unlikely to cause an allergic reaction); gargle of choice, e.g. cepacol or listerine, tylenol for fever, drink a lot of fluids.  Upper Respiratory Infection, Adult An upper respiratory infection (URI) is also sometimes known as the common cold. The upper respiratory tract includes the nose, sinuses, throat, trachea, and bronchi. Bronchi are the airways leading to the lungs. Most people improve within 1 week, but symptoms can last up to 2 weeks. A residual cough may last even longer.   CAUSES Many different viruses can infect the tissues lining the upper respiratory tract. The tissues become irritated and inflamed and often become very moist. Mucus production is also common. A cold is contagious. You can easily spread the virus to others by oral contact. This includes kissing, sharing a glass, coughing, or sneezing. Touching your mouth or nose and then touching a surface, which is then touched by another person, can also spread the virus. SYMPTOMS   Symptoms typically develop 1 to 3 days after you come in contact with a cold virus. Symptoms vary from person to person. They may include:  Runny nose.   Sneezing.   Nasal congestion.   Sinus irritation.   Sore throat.   Loss of voice (laryngitis).   Cough.   Fatigue.   Muscle aches.   Loss of appetite.   Headache.   Low-grade fever.  DIAGNOSIS   You might diagnose your own cold based on familiar symptoms, since most people get a cold 2 to 3 times a year. Your caregiver can confirm this based on your exam. Most importantly, your caregiver can check that your symptoms are not due to another disease such as strep throat, sinusitis, pneumonia, asthma, or epiglottitis. Blood tests, throat tests, and X-rays are not necessary to diagnose a common cold, but they  may sometimes be helpful in excluding other more serious diseases. Your caregiver will decide if any further tests are required. RISKS AND COMPLICATIONS   You may be at risk for a more severe case of the common cold if you smoke cigarettes, have chronic heart disease (such as heart failure) or lung disease (such as asthma), or if you have a weakened immune system. The very young and very old are also at risk for more serious infections. Bacterial sinusitis, middle ear infections, and bacterial pneumonia can complicate the common cold. The common cold can worsen asthma and chronic obstructive pulmonary disease (COPD). Sometimes, these complications can require emergency medical care and may be life-threatening. PREVENTION   The best way to protect against getting a cold is to practice good hygiene. Avoid oral or hand contact with people with cold symptoms. Wash your hands often if contact occurs. There is no clear evidence that vitamin C, vitamin E, echinacea, or exercise reduces the chance of developing a cold. However, it is always recommended to get plenty of rest and practice good nutrition. TREATMENT   Treatment is directed at relieving symptoms. There is no cure. Antibiotics are not effective, because the infection is caused by a virus, not by bacteria. Treatment may include:  Increased fluid intake. Sports drinks offer valuable electrolytes, sugars, and fluids.   Breathing heated mist or steam (vaporizer or shower).   Eating chicken soup or other clear broths, and maintaining good nutrition.   Getting plenty of rest.   Using gargles or lozenges for  comfort.   Controlling fevers with ibuprofen or acetaminophen as directed by your caregiver.   Increasing usage of your inhaler if you have asthma.  Zinc gel and zinc lozenges, taken in the first 24 hours of the common cold, can shorten the duration and lessen the severity of symptoms. Pain medicines may help with fever, muscle aches, and throat  pain. A variety of non-prescription medicines are available to treat congestion and runny nose. Your caregiver can make recommendations and may suggest nasal or lung inhalers for other symptoms.   HOME CARE INSTRUCTIONS    Only take over-the-counter or prescription medicines for pain, discomfort, or fever as directed by your caregiver.   Use a warm mist humidifier or inhale steam from a shower to increase air moisture. This may keep secretions moist and make it easier to breathe.   Drink enough water and fluids to keep your urine clear or pale yellow.   Rest as needed.   Return to work when your temperature has returned to normal or as your caregiver advises. You may need to stay home longer to avoid infecting others. You can also use a face mask and careful hand washing to prevent spread of the virus.  SEEK MEDICAL CARE IF:    After the first few days, you feel you are getting worse rather than better.   You need your caregiver's advice about medicines to control symptoms.   You develop chills, worsening shortness of breath, or brown or red sputum. These may be signs of pneumonia.   You develop yellow or brown nasal discharge or pain in the face, especially when you bend forward. These may be signs of sinusitis.   You develop a fever, swollen neck glands, pain with swallowing, or white areas in the back of your throat. These may be signs of strep throat.  SEEK IMMEDIATE MEDICAL CARE IF:    You have a fever.   You develop severe or persistent headache, ear pain, sinus pain, or chest pain.   You develop wheezing, a prolonged cough, cough up blood, or have a change in your usual mucus (if you have chronic lung disease).   You develop sore muscles or a stiff neck.  Document Released: 03/24/2001 Document Revised: 09/17/2011 Document Reviewed: 01/30/2011 [facility] Patient Information 2012 [place], [place].

## 2011-12-23 NOTE — Progress Notes (Signed)
  Subjective:    Patient ID: Briana [name], female    DOB: March 22, 1931, 78 y.o.   MRN: [id number]  HPI Briana [name] presents for symptoms of cough, congestion. She has had no fever, chills, productive sputum, SOB.  PMH, FamHx and SocHx reviewed for any changes and relevance.    Review of Systems System review is negative for any constitutional, cardiac, pulmonary, GI or neuro symptoms or complaints other than as described in the HPI.     Objective:   Physical Exam Filed Vitals:   12/22/11 1130  BP: 118/70  Pulse: 68  Temp: 98.8 F (37.1 C)  Resp: 16   gen'l - WNWD AA woman in no distress HEENT- throat with slight cobblestone appearance. TMs normal Cor- RRR Pulm - normal respirations, no rales or wheezes Neuro - A&O x 3       Assessment & Plan:  Viral URI - supportive care, no need for antibiotics                  Call back for fever, sputum production, SOB

## 2012-02-02 ENCOUNTER — Encounter: Payer: Self-pay | Admitting: Internal Medicine

## 2012-02-02 ENCOUNTER — Ambulatory Visit (INDEPENDENT_AMBULATORY_CARE_PROVIDER_SITE_OTHER): Payer: Medicare PPO | Admitting: Internal Medicine

## 2012-02-02 VITALS — BP 132/60 | HR 58 | Temp 98.6°F | Resp 16 | Wt 169.0 lb

## 2012-02-02 DIAGNOSIS — M129 Arthropathy, unspecified: Secondary | ICD-10-CM

## 2012-02-02 NOTE — Progress Notes (Signed)
  Subjective:    Patient ID: Briana [name], female    DOB: 1931/02/28, 78 y.o.   MRN: [id number]  HPI Mrs. Tworek presents to discuss treatment for her progressive DJD knees. She has seen orthopedics in the past - office notes from [facility] reviewed. She has tricompartmental disease right knee with valgus deformity. She has had synvsc injections and she continues to get period steroid injections from Dr. Nickola Major. She has chronic pain but is able to manage all her ADLs w/o use of walker or cane. She does get relief from ibuprofen, which she tolerates well. Dr. Madelon Lips has talked with her about TKR.  Past Medical History  Diagnosis Date  . ACUTE URIS OF UNSPECIFIED SITE 11/26/2008  . ALLERGIC ARTHRITIS OTHER SPECIFIED SITES 10/22/2010  . ALLERGIC RHINITIS 10/28/2007  . ARTHRITIS, KNEES, BILATERAL 10/28/2007  . BACK PAIN 04/12/2009  . BUNIONECTOMY, HX OF 10/28/2007  . CELLULITIS&ABSCESS OF HAND EXCEPT FINGERS&THUMB 10/23/2010  . HYPERLIPIDEMIA 10/28/2007  . HYPERTENSION 10/28/2007  . INSOMNIA UNSPECIFIED 10/31/2007  . MYOSITIS 10/22/2010  . NECK PAIN 12/03/2008  . Pain in joint, site unspecified 10/07/2010  . WRIST PAIN, LEFT 10/22/2010   Past Surgical History  Procedure Date  . Arthroscopic surgery and rotator cuff repair left shoulder   . Arthroscopy right knee hx of surgery   . Hand surgery for broken finger, remote   . Bunionectomy     hx of   Family History  Problem Relation Age of Onset  . Cancer Brother     lung cancer  . Coronary artery disease Other   . Heart attack Other    History   Social History  . Marital Status: Widowed    Spouse Name: N/A    Number of Children: 4  . Years of Education: 12   Occupational History  . 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 Smoking status: Never Smoker   . Smokeless tobacco: Not on file  . Alcohol Use: Not on file  . Drug Use: Not on file  . Sexually Active: Not on file   Other Topics Concern  . 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       Review of Systems System review is negative for any constitutional, cardiac, pulmonary, GI or neuro symptoms or complaints other than as described in the HPI.     Objective:   Physical Exam Filed Vitals:   02/02/12 0939  BP: 132/60  Pulse: 58  Temp: 98.6 F (37 C)  Resp: 16   Gen'l- heavyset AA woman in no acute distress HEENT-C&S clear Cor- RRR Pulm - normal respirations Ext - right knee with modest enlargement, Normal ROM, no crepitus, no click or lock, negative drawer sign, no lateral instability. Able to bear weight and was able to step up to exam table. No effusion is noted.        Assessment & Plan:

## 2012-02-02 NOTE — Assessment & Plan Note (Signed)
Patient with advanced DJD knees R>L but she is ambulating and managing her ADLs.  Plan - take ibuprofen 600mg  3-4 times a day. GI precautions given           Intra-articular steroid inject per Dr. Nickola Major           Advised to delay TKR at this time.

## 2012-02-02 NOTE — Patient Instructions (Signed)
Knee pain - on exam there is good movement, no click or lock.   Recommendation: as long as you can do your daily activities, as long as there is no collapse or locking of the knee, as long as you can get relief with ibuprofen 600 mg up to 4 times a day you should delay total knee replacement.  As you take the ibuprofen be sure to pay attention for any signs of stomach irritation or bleeding.  When you see Dr. Nickola Major please have her send me a copy of her office note.

## 2012-02-23 ENCOUNTER — Other Ambulatory Visit: Payer: Self-pay | Admitting: Internal Medicine

## 2012-02-23 MED ORDER — SIMVASTATIN 40 MG PO TABS: ORAL_TABLET | ORAL | Status: DC

## 2012-02-23 MED ORDER — AMLODIPINE BESYLATE 10 MG PO TABS: 10.0000 mg | ORAL_TABLET | Freq: Every day | ORAL | Status: DC

## 2012-02-23 MED ORDER — ATENOLOL 100 MG PO TABS: 100.0000 mg | ORAL_TABLET | Freq: Every day | ORAL | Status: DC

## 2012-02-23 NOTE — Telephone Encounter (Signed)
Notified pt [facility],... 02/23/12@2 :58pm/LMB

## 2012-02-23 NOTE — Telephone Encounter (Signed)
Pt requesting amlodipine 10 mg--for high blood pressure--ate 100 mg tab--atenolol--and simdastatin 40 mg tab--please call pt concerning these med--[facility]--pt [phone]

## 2012-02-23 NOTE — Telephone Encounter (Signed)
Ok for refill of amlodipine and atenolol and simvastatin - 90 days

## 2012-04-07 ENCOUNTER — Telehealth: Payer: Self-pay | Admitting: Internal Medicine

## 2012-04-07 NOTE — Telephone Encounter (Signed)
Reviewed notes. She has seen Dr. Madelon Lips in the past. As an established patient she can call for an appointment on her own.

## 2012-04-07 NOTE — Telephone Encounter (Signed)
The pt called and is hoping to get a referral for an orthopedic dr for knee pain.  She is more than happy to come in for and ov first, but she wanted to make sure she needed the ov before the referral to save on cost.  Please let me know, and i'll be happy to schedule her.   Thanks!

## 2012-04-08 NOTE — Telephone Encounter (Signed)
Spoke with pt, she is going to try to call Dr.Caffrey today.

## 2012-05-04 ENCOUNTER — Encounter: Payer: Self-pay | Admitting: Internal Medicine

## 2012-05-30 ENCOUNTER — Encounter: Payer: Self-pay | Admitting: Internal Medicine

## 2012-05-30 ENCOUNTER — Ambulatory Visit (INDEPENDENT_AMBULATORY_CARE_PROVIDER_SITE_OTHER): Payer: Medicare PPO | Admitting: Internal Medicine

## 2012-05-30 VITALS — BP 148/80 | HR 61 | Temp 97.7°F | Resp 16 | Wt 173.0 lb

## 2012-05-30 DIAGNOSIS — M129 Arthropathy, unspecified: Secondary | ICD-10-CM

## 2012-05-30 DIAGNOSIS — I10 Essential (primary) hypertension: Secondary | ICD-10-CM

## 2012-05-30 DIAGNOSIS — E785 Hyperlipidemia, unspecified: Secondary | ICD-10-CM

## 2012-05-30 DIAGNOSIS — Z0001 Encounter for general adult medical examination with abnormal findings: Secondary | ICD-10-CM | POA: Insufficient documentation

## 2012-05-30 DIAGNOSIS — Z0000 Encounter for general adult medical examination without abnormal findings: Secondary | ICD-10-CM

## 2012-05-30 DIAGNOSIS — Z23 Encounter for immunization: Secondary | ICD-10-CM

## 2012-05-30 NOTE — Assessment & Plan Note (Signed)
Outside lab reveals great control with LDL much better than goal of 100 or less, HDL better than goal of 50 or higher. Liver functions are normal.  Plan Continue present medication

## 2012-05-30 NOTE — Addendum Note (Signed)
Addended by: Elnora Morrison on: 05/30/2012 01:06 PM   Modules accepted: Orders

## 2012-05-30 NOTE — Assessment & Plan Note (Signed)
INterval medical history - notable for progressive knee pain otherwise very stable. She is current with colorectal and breast cancer screening. Immunizations -  Brought up to date except Shingles vaccine.  In summary - a very nice woman who is medically stable and cleared for TKR. She is encouraged to continue with her water aerobics. She will be seen when in hospital and otherwise on an as needed basis.

## 2012-05-30 NOTE — Assessment & Plan Note (Signed)
Progress OA knees with valgus deformity right knee. She is contemplating TKR - Dr. Kristeen Miss.  Plan   she is medically cleared for surgery.

## 2012-05-30 NOTE — Progress Notes (Signed)
Subjective:    Patient ID: Briana [name], female    DOB: 1931/04/29, 78 y.o.   MRN: [id number]  HPI Mrs. Farrington is here for annual Medicare wellness examination and management of other chronic and acute problems.  CC: knee pain that is progressive and she is contemplating TKR right, perhaps left TKR later. She has been followed by Dr. Nickola Major for RA.     The risk factors are reflected in the social history.  The roster of all physicians providing medical care to patient - is listed in the Snapshot section of the chart.  Activities of daily living:  The patient is 100% inedpendent in all ADLs: dressing, toileting, feeding as well as independent mobility  Home safety : The patient has smoke detectors in the home. They wear seatbelt.  firearms are present in the home, kept in a safe fashion - a rifle which she doesn't know how to load. . There is no violence in the home.   There is no risks for hepatitis, STDs or HIV. There is no   history of blood transfusion. They have no travel history to infectious disease endemic areas of the world.  The patient has not seen their dentist in the last six month- has full dentures.. They have seen their eye doctor in the last year. They deny any hearing difficulty and have not had audiologic testing in the last year.    They do not  have excessive sun exposure. Discussed the need for sun protection: hats, long sleeves and use of sunscreen if there is significant sun exposure.   Diet: the importance of a healthy diet is discussed. They do have a healthy diet.  The patient has a regular exercise program: water aerobics , 30  duration, 2 per week.  The benefits of regular aerobic exercise were discussed.  Depression screen: there are no signs or vegative symptoms of depression- irritability, change in appetite, anhedonia, sadness/tearfullness.  Cognitive assessment: the patient manages all their financial and personal affairs and is actively engaged.    The following portions of the patient's history were reviewed and updated as appropriate: allergies, current medications, past family history, past medical history,  past surgical history, past social history  and problem list.  Vision, hearing, body mass index were assessed and reviewed.   During the course of the visit the patient was educated and counseled about appropriate screening and preventive services including : fall prevention , diabetes screening, nutrition counseling, colorectal cancer screening, and recommended immunizations.  Past Medical History  Diagnosis Date  . ACUTE URIS OF UNSPECIFIED SITE 11/26/2008  . ALLERGIC ARTHRITIS OTHER SPECIFIED SITES 10/22/2010  . ALLERGIC RHINITIS 10/28/2007  . ARTHRITIS, KNEES, BILATERAL 10/28/2007  . BACK PAIN 04/12/2009  . BUNIONECTOMY, HX OF 10/28/2007  . CELLULITIS&ABSCESS OF HAND EXCEPT FINGERS&THUMB 10/23/2010  . HYPERLIPIDEMIA 10/28/2007  . HYPERTENSION 10/28/2007  . INSOMNIA UNSPECIFIED 10/31/2007  . MYOSITIS 10/22/2010  . NECK PAIN 12/03/2008  . Pain in joint, site unspecified 10/07/2010  . WRIST PAIN, LEFT 10/22/2010   Past Surgical History  Procedure Date  . Arthroscopic surgery and rotator cuff repair left shoulder   . Arthroscopy right knee hx of surgery   . Hand surgery for broken finger, remote   . Bunionectomy     hx of   Family History  Problem Relation Age of Onset  . Cancer Brother     lung cancer  . Coronary artery disease Other   . Heart attack Other    History  Social History  . Marital Status: Widowed    Spouse Name: N/A    Number of Children: 4  . Years of Education: 12   Occupational History  . 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 Smoking status: Never Smoker   . Smokeless tobacco: Not on file  . Alcohol Use: Not on file  . Drug Use: Not on file  . Sexually Active: Not on file   Other Topics Concern  . 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    Current Outpatient Prescriptions on File Prior to Visit  Medication Sig Dispense Refill  . amLODipine (NORVASC) 10 MG tablet Take 1 tablet (10 mg total) by mouth daily.  90 tablet  2  . atenolol (TENORMIN) 100 MG tablet Take 1 tablet (100 mg total) by mouth daily.  90 tablet  2  . fexofenadine (ALLEGRA) 180 MG tablet Take 180 mg by mouth as needed.        . folic acid (FOLVITE) 1 MG tablet Take 1 mg by mouth daily.      Marland Kitchen ibuprofen (ADVIL,MOTRIN) 600 MG tablet TAKE ONE TABLET BY MOUTH THREE TIMES DAILY TO DIFFUSE JOINT PAIN GI PRECAUTIONS  100 tablet  2  . methotrexate (RHEUMATREX) 2.5 MG tablet Take 20 mg by mouth once a week. Caution:Chemotherapy. Protect from light.      . simvastatin (ZOCOR) 40 MG tablet TAKE ONE TABLET BY MOUTH EVERY DAY IN THE EVENING  90 tablet  2  . temazepam (RESTORIL) 15 MG capsule Take 1 capsule (15 mg total) by mouth at bedtime as needed for sleep.  30 capsule  5  . temazepam (RESTORIL) 15 MG capsule Take 1 capsule (15 mg total) by mouth at bedtime as needed for sleep.  30 capsule  5     Review of Systems Constitutional:  Negative for fever, chills, activity change and unexpected weight change.  HEENT:  Negative for hearing loss, ear pain, congestion, neck stiffness and postnasal drip. Negative for sore throat or swallowing problems. Negative for dental complaints.   Eyes: Negative for vision loss or change in visual acuity.  Respiratory: Negative for chest tightness and wheezing. Negative for DOE.   Cardiovascular: Negative for chest pain or palpitations. No decreased exercise tolerance Gastrointestinal: No change in bowel habit. No bloating or gas. No reflux or indigestion Genitourinary: Negative for urgency, frequency, flank pain and difficulty urinating.  Musculoskeletal: Negative for myalgias, back pain, arthralgias and  gait problem. Doing well with MTX so that her main problem is her knees - OA. Neurological: Negative for dizziness, tremors, weakness and headaches.  Hematological: Negative for adenopathy.  Psychiatric/Behavioral: Negative for behavioral problems and dysphoric mood.       Objective:   Physical Exam Filed Vitals:   05/30/12 0910  BP: 148/80  Pulse: 61  Temp: 97.7 F (36.5 C)  Resp: 16   Wt Readings from Last 3 Encounters:  05/30/12 173 lb (78.472 kg)  02/02/12 169 lb (76.658 kg)  12/22/11 170 lb 4 oz (77.225 kg)   Gen'l: well nourished, well developed AA woman in no distress HEENT - [place]/AT, EACs/TMs normal right; left with cerumen impaction, oropharynx with full dentures, no buccal lesions, posterior pharynx clear, mucous membranes moist. C&S clear, arcus senilis bilaterally,  PERRLA, fundi -  normal Neck - supple, no thyromegaly Nodes- negative submental, cervical, supraclavicular regions Chest - no deformity, no CVAT Lungs - cleat without rales, wheezes. No increased work of breathing Breast - - Skin normal, nipples w/o discharge, no fixed mass or lesion, no axillary adenopathy. Cardiovascular - regular rate and rhythm, quiet precordium, no murmurs, rubs or gallops, 2+ radial, DP and PT pulses Abdomen - BS+ x 4, no HSM, no guarding or rebound or tenderness Pelvic - deferred to age Rectal - deferred to Colonoscopy Extremities - no clubbing, cyanosis, edema. Valgus deformity right knee Neuro - A&O x 3, CN II-XII normal, motor strength normal and equal, DTRs 2+ and symmetrical biceps, radial, and patellar tendons. Cerebellar - no tremor, no rigidity, fluid movement and normal gait. Derm - Head, neck, back, abdomen and extremities without suspicious lesions  Outside lab: Mar 03, 2012 - Dr. Nickola Major' office: Hgb 11.7, WBC 5.9, Plts 187; Bmet normal with Creatinine 0.73, glucose 99; Cholesterol 194, HDL 89, D-LDL 69; LFT's normal; ESR 33        Assessment & Plan:

## 2012-05-30 NOTE — Assessment & Plan Note (Signed)
BP Readings from Last 3 Encounters:  05/30/12 148/80  02/02/12 132/60  12/22/11 118/70   Adequate control. No change in medication

## 2012-06-23 ENCOUNTER — Encounter (HOSPITAL_COMMUNITY): Payer: Self-pay | Admitting: Pharmacy Technician

## 2012-06-26 ENCOUNTER — Other Ambulatory Visit: Payer: Self-pay | Admitting: Physician Assistant

## 2012-06-27 ENCOUNTER — Encounter (HOSPITAL_COMMUNITY): Payer: Self-pay

## 2012-06-27 ENCOUNTER — Encounter (HOSPITAL_COMMUNITY)
Admission: RE | Admit: 2012-06-27 | Discharge: 2012-06-27 | Disposition: A | Discharge status: Home / Self Care | Payer: Medicare PPO | Source: Ambulatory Visit | Attending: Orthopedic Surgery | Admitting: Orthopedic Surgery

## 2012-06-27 LAB — URINALYSIS, ROUTINE W REFLEX MICROSCOPIC
Bilirubin Urine: NEGATIVE
Hgb urine dipstick: NEGATIVE
Ketones, ur: NEGATIVE mg/dL
Protein, ur: NEGATIVE mg/dL
Urobilinogen, UA: 0.2 mg/dL (ref 0.0–1.0)

## 2012-06-27 LAB — CBC WITH DIFFERENTIAL/PLATELET
Basophils Relative: 0 % (ref 0–1)
Eosinophils Absolute: 0.1 10*3/uL (ref 0.0–0.7)
Eosinophils Relative: 2 % (ref 0–5)
Lymphs Abs: 2.2 10*3/uL (ref 0.7–4.0)
MCH: 32.2 pg (ref 26.0–34.0)
MCHC: 34.9 g/dL (ref 30.0–36.0)
MCV: 92.2 fL (ref 78.0–100.0)
Platelets: 198 10*3/uL (ref 150–400)
RBC: 3.85 MIL/uL — ABNORMAL LOW (ref 3.87–5.11)

## 2012-06-27 LAB — COMPREHENSIVE METABOLIC PANEL
ALT: 13 U/L (ref 0–35)
Albumin: 3.7 g/dL (ref 3.5–5.2)
BUN: 17 mg/dL (ref 6–23)
Calcium: 9.8 mg/dL (ref 8.4–10.5)
GFR calc Af Amer: >90 mL/min (ref >90)
Glucose, Bld: 100 mg/dL — ABNORMAL HIGH (ref 70–99)
Sodium: 142 mEq/L (ref 135–145)
Total Protein: 7.5 g/dL (ref 6.0–8.3)

## 2012-06-27 LAB — SURGICAL PCR SCREEN
MRSA, PCR: NEGATIVE
Staphylococcus aureus: POSITIVE — AB

## 2012-06-27 LAB — ABO/RH: ABO/RH(D): O POS

## 2012-06-27 LAB — PROTIME-INR: Prothrombin Time: 12.6 seconds (ref 11.6–15.2)

## 2012-06-27 LAB — TYPE AND SCREEN
ABO/RH(D): O POS
Antibody Screen: NEGATIVE

## 2012-06-27 NOTE — Pre-Procedure Instructions (Signed)
20 Briana [name]  06/27/2012   Your procedure is scheduled on:  Friday  07/01/12   Report to [facility] at 800 AM.  Call this number if you have problems the morning of surgery: [phone]   Remember:   Do not eat food OR DRINK :After Midnight.   Take these medicines the morning of surgery with A SIP OF WATER: NORVASC(AMLODIPINE), ATENOLOL(TENORMIN), METHOTEXATE    Do not wear jewelry, make-up or nail polish.  Do not wear lotions, powders, or perfumes. You may wear deodorant.  Do not shave 48 hours prior to surgery. Men may shave face and neck.  Do not bring valuables to the hospital.  Contacts, dentures or bridgework may not be worn into surgery.  Leave suitcase in the car. After surgery it may be brought to your room.  For patients admitted to the hospital, checkout time is 11:00 AM the day of discharge.   Patients discharged the day of surgery will not be allowed to drive home.  Name and phone number of your driver:  Special Instructions: CHG Shower Use Special Wash: 1/2 bottle night before surgery and 1/2 bottle morning of surgery.   Please read over the following fact sheets that you were given: Pain Booklet, Coughing and Deep Breathing, Blood Transfusion Information, Total Joint Packet, MRSA Information and Surgical Site Infection Prevention

## 2012-06-29 LAB — URINE CULTURE

## 2012-06-30 MED ORDER — CHLORHEXIDINE GLUCONATE 4 % EX LIQD: 60.0000 mL | Freq: Once | CUTANEOUS | Status: DC

## 2012-06-30 MED ORDER — CEFAZOLIN SODIUM-DEXTROSE 2-3 GM-% IV SOLR
2.0000 g | INTRAVENOUS | Status: AC
  Administered 2012-07-01: 2 g via INTRAVENOUS
  Filled 2012-06-30: qty 50

## 2012-06-30 MED ORDER — ACETAMINOPHEN 10 MG/ML IV SOLN
1000.0000 mg | Freq: Four times a day (QID) | INTRAVENOUS | Status: DC
  Administered 2012-07-01: 1000 mg via INTRAVENOUS
  Filled 2012-06-30 (×3): qty 100

## 2012-06-30 MED ORDER — SODIUM CHLORIDE 0.9 % IV SOLN: INTRAVENOUS | Status: DC

## 2012-06-30 NOTE — H&P (Signed)
  Follow-up right knee osteoarthritis.  HPI: The patient is a 78-year-old female with a history of right knee osteoarthritis. Also with hypertension, high cholesterol, and rheumatoid arthritis. Here today to discuss possible right knee total arthroplasty. She has failed conservative treatments with p.o. NSAIDs, pain medication, corticosteroid injection, visco-supplementation, and knee arthroscopy. The knee pain is significantly affecting her quality of life and activities of daily living. It will awaken her from sleep at night.   REVIEW OF SYSTEMS:    A 10-point review of systems obtained and positive for cataracts, glasses/contacts, dentures, bronchitis, high blood pressure, asthma. PAST MEDICAL HISTORY:    Right knee osteoarthritis, rheumatoid arthritis, hypertension, high cholesterol, asthma.  PAST SURGICAL HISTORY:    Knee arthroscopy, rotator cuff repair, and a foot surgery.  CURRENT MEDICATION:   Amlodipine Besylate 10 mg, simvastatin 40 mg, atenolol 100 mg, Temazepam 15 mg, methotrexate 2.5 mg, ibuprofen 600 mg, folic acid 1 mg. She is not currently on any blood thinners. She states she has drug allergies to lisinopril, verapamil, hydrochlorothiazide, Dilacoran. FAMILY HISTORY:   Positive in her mother for heart disease, heart attack and high blood pressure. Father with heart disease and heart attack. Brother with heart disease and cancer. Sister with high blood pressure. Grandparents with high blood pressure. Her children have heart disease, heart attack, high blood pressure, and diabetes.  SOCIAL HISTORY:   The patient is a nonsmoker, nondrinker. She is widowed and retired. Lives in a one story residence with one other person.   EXAMINATION: The patient is seated in the exam room in no acute distress. Alert and oriented. Appears appropriate age. Height 5'4", weight 172 pounds. BMI calculated at 29.5. Vital signs: temperature 97.4, pulse 55, respiration 18, blood pressure 166/81. HEENT:  pupils are equal, round and reactive to light. She does have upper and lower dentures. Neck is supple with good range of motion. Chest and lungs clear to auscultation bilaterally. Normal breath sounds. Normal effort. Cardiac: regular rate and rhythm. No signs of murmurs. Abdomen: soft, nontender, positive bowel sounds in all 4 quadrants. Neuro: cranial nerves grossly intact. Skin: no signs of rashes or lesions. Rectal/breast exam not indicated for surgery. Musculoskeletal: examination of right lower extremity shows she is neurovascularly intact. She has medial and lateral joint line tenderness to the right knee in a valgum alignment. Positive patellofemoral crepitus. No erythema, warmth or effusion. Stable ligamentous testing. Dorsiflexion and plantar flexion of the ankle intact. No calf tenderness with palpation.    IMAGING: No new images obtained today. X-rays taken on 06-23-11 show a genu valgum alignment and tricompartmental DJD.   ASSESSMENT:   1.  Right knee osteoarthritis end stage.  2.  Rheumatoid arthritis. 3.  High cholesterol.  4.  Hypertension.  5.  Asthma.   PLAN: The risks and benefits of right total knee arthroplasty were discussed again today and patient wishes to proceed. She has a preadmission appointment on 06-27-12. Surgery scheduled for  07-01-12. She has obtained preoperative clearance from her primary care physician, Dr. Norins. Also discussed with Dr. Hawks, her rheumatologist's office, her methotrexate. She is to discontinue use one week prior to and 2 weeks following surgery. This was discussed with the patient today. She was given preoperative instructions. Also discussed postoperative DVT prophylaxis with Lovenox, perioperative antibiotics with Ancef. If she has any other questions or concerns prior to surgery may contact our office. With her history of rheumatoid arthritis patient would benefit from antibiotic impregnated cement.  Briana Freeze, PA-C 

## 2012-07-01 ENCOUNTER — Encounter (HOSPITAL_COMMUNITY)
Admission: RE | Disposition: A | Discharge status: Skilled Nursing Facility | Payer: Self-pay | Source: Ambulatory Visit | Attending: Orthopedic Surgery

## 2012-07-01 ENCOUNTER — Inpatient Hospital Stay (HOSPITAL_COMMUNITY): Payer: Medicare PPO | Admitting: Vascular Surgery

## 2012-07-01 ENCOUNTER — Encounter (HOSPITAL_COMMUNITY): Payer: Self-pay | Admitting: Vascular Surgery

## 2012-07-01 ENCOUNTER — Inpatient Hospital Stay (HOSPITAL_COMMUNITY): Payer: Medicare PPO

## 2012-07-01 ENCOUNTER — Inpatient Hospital Stay (HOSPITAL_COMMUNITY)
Admission: RE | Admit: 2012-07-01 | Discharge: 2012-07-04 | DRG: 470 | Disposition: A | Discharge status: Skilled Nursing Facility | Payer: Medicare PPO | Source: Ambulatory Visit | Attending: Orthopedic Surgery | Admitting: Orthopedic Surgery

## 2012-07-01 ENCOUNTER — Encounter (HOSPITAL_COMMUNITY): Payer: Self-pay | Admitting: *Deleted

## 2012-07-01 DIAGNOSIS — J45909 Unspecified asthma, uncomplicated: Secondary | ICD-10-CM | POA: Diagnosis present

## 2012-07-01 DIAGNOSIS — Z01812 Encounter for preprocedural laboratory examination: Secondary | ICD-10-CM

## 2012-07-01 DIAGNOSIS — Z8249 Family history of ischemic heart disease and other diseases of the circulatory system: Secondary | ICD-10-CM

## 2012-07-01 DIAGNOSIS — Z7901 Long term (current) use of anticoagulants: Secondary | ICD-10-CM

## 2012-07-01 DIAGNOSIS — I10 Essential (primary) hypertension: Secondary | ICD-10-CM | POA: Diagnosis present

## 2012-07-01 DIAGNOSIS — E876 Hypokalemia: Secondary | ICD-10-CM | POA: Diagnosis not present

## 2012-07-01 DIAGNOSIS — E785 Hyperlipidemia, unspecified: Secondary | ICD-10-CM | POA: Diagnosis present

## 2012-07-01 DIAGNOSIS — M1710 Unilateral primary osteoarthritis, unspecified knee: Principal | ICD-10-CM | POA: Diagnosis present

## 2012-07-01 DIAGNOSIS — E7800 Pure hypercholesterolemia, unspecified: Secondary | ICD-10-CM | POA: Diagnosis present

## 2012-07-01 DIAGNOSIS — M069 Rheumatoid arthritis, unspecified: Secondary | ICD-10-CM | POA: Diagnosis present

## 2012-07-01 DIAGNOSIS — Z79899 Other long term (current) drug therapy: Secondary | ICD-10-CM

## 2012-07-01 HISTORY — PX: TOTAL KNEE ARTHROPLASTY: SHX125

## 2012-07-01 SURGERY — ARTHROPLASTY, KNEE, TOTAL
Anesthesia: Regional | Site: Knee | Laterality: Right | Wound class: Clean

## 2012-07-01 MED ORDER — NEOSTIGMINE METHYLSULFATE 1 MG/ML IJ SOLN
INTRAMUSCULAR | Status: DC | PRN
  Administered 2012-07-01: 3 mg via INTRAVENOUS
  Administered 2012-07-01: 1 mg via INTRAVENOUS

## 2012-07-01 MED ORDER — ACETAMINOPHEN 325 MG PO TABS
650.0000 mg | ORAL_TABLET | Freq: Four times a day (QID) | ORAL | Status: DC | PRN
  Administered 2012-07-02 – 2012-07-04 (×2): 650 mg via ORAL
  Filled 2012-07-01 (×2): qty 2

## 2012-07-01 MED ORDER — SIMVASTATIN 40 MG PO TABS
40.0000 mg | ORAL_TABLET | Freq: Every evening | ORAL | Status: DC
  Administered 2012-07-01: 40 mg via ORAL
  Filled 2012-07-01 (×3): qty 1

## 2012-07-01 MED ORDER — SODIUM CHLORIDE 0.9 % IV SOLN
INTRAVENOUS | Status: DC
  Administered 2012-07-01: 21:00:00 via INTRAVENOUS

## 2012-07-01 MED ORDER — FOLIC ACID 1 MG PO TABS
1.0000 mg | ORAL_TABLET | Freq: Every day | ORAL | Status: DC
  Administered 2012-07-02 – 2012-07-04 (×3): 1 mg via ORAL
  Filled 2012-07-01 (×3): qty 1

## 2012-07-01 MED ORDER — MENTHOL 3 MG MT LOZG
1.0000 | LOZENGE | OROMUCOSAL | Status: DC | PRN
  Administered 2012-07-01: 3 mg via ORAL
  Filled 2012-07-01: qty 9

## 2012-07-01 MED ORDER — ONDANSETRON HCL 4 MG/2ML IJ SOLN
INTRAMUSCULAR | Status: DC | PRN
  Administered 2012-07-01: 4 mg via INTRAVENOUS

## 2012-07-01 MED ORDER — GLYCOPYRROLATE 0.2 MG/ML IJ SOLN
INTRAMUSCULAR | Status: DC | PRN
  Administered 2012-07-01: 0.4 mg via INTRAVENOUS
  Administered 2012-07-01: 0.2 mg via INTRAVENOUS

## 2012-07-01 MED ORDER — METOCLOPRAMIDE HCL 10 MG PO TABS
5.0000 mg | ORAL_TABLET | Freq: Three times a day (TID) | ORAL | Status: DC | PRN
  Administered 2012-07-02 – 2012-07-04 (×3): 10 mg via ORAL
  Filled 2012-07-01: qty 2
  Filled 2012-07-01 (×2): qty 1

## 2012-07-01 MED ORDER — ACETAMINOPHEN 650 MG RE SUPP: 650.0000 mg | Freq: Four times a day (QID) | RECTAL | Status: DC | PRN

## 2012-07-01 MED ORDER — ACETAMINOPHEN 10 MG/ML IV SOLN
1000.0000 mg | Freq: Four times a day (QID) | INTRAVENOUS | Status: AC
Start: 2012-07-01 — End: 2012-07-02
  Administered 2012-07-01 – 2012-07-02 (×3): 1000 mg via INTRAVENOUS
  Filled 2012-07-01 (×4): qty 100

## 2012-07-01 MED ORDER — ONDANSETRON HCL 4 MG/2ML IJ SOLN
4.0000 mg | Freq: Four times a day (QID) | INTRAMUSCULAR | Status: DC | PRN
  Administered 2012-07-01 – 2012-07-03 (×4): 4 mg via INTRAVENOUS
  Filled 2012-07-01 (×5): qty 2

## 2012-07-01 MED ORDER — METHOCARBAMOL 500 MG PO TABS
500.0000 mg | ORAL_TABLET | Freq: Four times a day (QID) | ORAL | Status: DC | PRN
  Administered 2012-07-01 – 2012-07-04 (×9): 500 mg via ORAL
  Filled 2012-07-01 (×11): qty 1

## 2012-07-01 MED ORDER — METOCLOPRAMIDE HCL 5 MG/ML IJ SOLN
5.0000 mg | Freq: Three times a day (TID) | INTRAMUSCULAR | Status: DC | PRN
  Administered 2012-07-01 – 2012-07-02 (×3): 10 mg via INTRAVENOUS
  Filled 2012-07-01 (×3): qty 2

## 2012-07-01 MED ORDER — ONDANSETRON HCL 4 MG PO TABS
4.0000 mg | ORAL_TABLET | Freq: Four times a day (QID) | ORAL | Status: DC | PRN
  Administered 2012-07-03: 4 mg via ORAL
  Filled 2012-07-01: qty 1

## 2012-07-01 MED ORDER — ATENOLOL 100 MG PO TABS
100.0000 mg | ORAL_TABLET | Freq: Every day | ORAL | Status: DC
  Administered 2012-07-02 – 2012-07-04 (×3): 100 mg via ORAL
  Filled 2012-07-01 (×3): qty 1

## 2012-07-01 MED ORDER — PHENOL 1.4 % MT LIQD: 1.0000 | OROMUCOSAL | Status: DC | PRN

## 2012-07-01 MED ORDER — HYDROMORPHONE HCL PF 1 MG/ML IJ SOLN
0.5000 mg | INTRAMUSCULAR | Status: DC | PRN
  Administered 2012-07-01 – 2012-07-02 (×5): 0.5 mg via INTRAVENOUS
  Filled 2012-07-01 (×5): qty 1

## 2012-07-01 MED ORDER — PROPOFOL 10 MG/ML IV BOLUS
INTRAVENOUS | Status: DC | PRN
  Administered 2012-07-01: 150 mg via INTRAVENOUS

## 2012-07-01 MED ORDER — HYDROMORPHONE HCL PF 1 MG/ML IJ SOLN
0.2500 mg | INTRAMUSCULAR | Status: DC | PRN
  Administered 2012-07-01 (×2): 0.5 mg via INTRAVENOUS

## 2012-07-01 MED ORDER — ENOXAPARIN SODIUM 30 MG/0.3ML ~~LOC~~ SOLN
30.0000 mg | Freq: Two times a day (BID) | SUBCUTANEOUS | Status: DC
  Administered 2012-07-02 – 2012-07-04 (×5): 30 mg via SUBCUTANEOUS
  Filled 2012-07-01 (×7): qty 0.3

## 2012-07-01 MED ORDER — HYDROMORPHONE HCL PF 1 MG/ML IJ SOLN
INTRAMUSCULAR | Status: AC
  Administered 2012-07-01: 0.5 mg via INTRAVENOUS
  Filled 2012-07-01: qty 1

## 2012-07-01 MED ORDER — TEMAZEPAM 15 MG PO CAPS: 15.0000 mg | ORAL_CAPSULE | Freq: Every evening | ORAL | Status: DC | PRN

## 2012-07-01 MED ORDER — LORATADINE 10 MG PO TABS
10.0000 mg | ORAL_TABLET | Freq: Every day | ORAL | Status: DC
  Administered 2012-07-03 – 2012-07-04 (×2): 10 mg via ORAL
  Filled 2012-07-01 (×3): qty 1

## 2012-07-01 MED ORDER — CEFAZOLIN SODIUM 1-5 GM-% IV SOLN
1.0000 g | Freq: Four times a day (QID) | INTRAVENOUS | Status: AC
  Administered 2012-07-01 (×2): 1 g via INTRAVENOUS
  Filled 2012-07-01 (×2): qty 50

## 2012-07-01 MED ORDER — OXYCODONE HCL 5 MG PO TABS
5.0000 mg | ORAL_TABLET | ORAL | Status: DC | PRN
  Administered 2012-07-01 (×2): 5 mg via ORAL
  Administered 2012-07-02 (×3): 10 mg via ORAL
  Administered 2012-07-02: 5 mg via ORAL
  Administered 2012-07-02: 10 mg via ORAL
  Administered 2012-07-02: 5 mg via ORAL
  Administered 2012-07-02 – 2012-07-03 (×2): 10 mg via ORAL
  Administered 2012-07-03 (×2): 5 mg via ORAL
  Administered 2012-07-03: 10 mg via ORAL
  Administered 2012-07-04 (×3): 5 mg via ORAL
  Filled 2012-07-01 (×4): qty 1
  Filled 2012-07-01: qty 2
  Filled 2012-07-01: qty 1
  Filled 2012-07-01 (×4): qty 2
  Filled 2012-07-01: qty 1
  Filled 2012-07-01 (×2): qty 2
  Filled 2012-07-01 (×2): qty 1
  Filled 2012-07-01 (×2): qty 2

## 2012-07-01 MED ORDER — ACETAMINOPHEN 10 MG/ML IV SOLN
INTRAVENOUS | Status: AC
  Filled 2012-07-01: qty 100

## 2012-07-01 MED ORDER — FENTANYL CITRATE 0.05 MG/ML IJ SOLN
INTRAMUSCULAR | Status: DC | PRN
  Administered 2012-07-01 (×7): 50 ug via INTRAVENOUS

## 2012-07-01 MED ORDER — ROCURONIUM BROMIDE 100 MG/10ML IV SOLN
INTRAVENOUS | Status: DC | PRN
Start: 2012-07-01 — End: 2012-07-01
  Administered 2012-07-01: 40 mg via INTRAVENOUS

## 2012-07-01 MED ORDER — ACETAMINOPHEN 10 MG/ML IV SOLN: 1000.0000 mg | Freq: Once | INTRAVENOUS | Status: DC | PRN

## 2012-07-01 MED ORDER — LACTATED RINGERS IV SOLN
INTRAVENOUS | Status: DC
  Administered 2012-07-01: 10:00:00 via INTRAVENOUS

## 2012-07-01 MED ORDER — AMLODIPINE BESYLATE 10 MG PO TABS
10.0000 mg | ORAL_TABLET | Freq: Every day | ORAL | Status: DC
  Administered 2012-07-02 – 2012-07-04 (×3): 10 mg via ORAL
  Filled 2012-07-01 (×3): qty 1

## 2012-07-01 MED ORDER — ONDANSETRON HCL 4 MG/2ML IJ SOLN
INTRAMUSCULAR | Status: AC
  Administered 2012-07-01: 4 mg via INTRAVENOUS
  Filled 2012-07-01: qty 2

## 2012-07-01 MED ORDER — LACTATED RINGERS IV SOLN
INTRAVENOUS | Status: DC | PRN
  Administered 2012-07-01: 11:00:00 via INTRAVENOUS

## 2012-07-01 MED ORDER — LIDOCAINE HCL (CARDIAC) 20 MG/ML IV SOLN
INTRAVENOUS | Status: DC | PRN
  Administered 2012-07-01: 50 mg via INTRAVENOUS

## 2012-07-01 MED ORDER — METHOCARBAMOL 100 MG/ML IJ SOLN
500.0000 mg | Freq: Four times a day (QID) | INTRAVENOUS | Status: DC | PRN
  Filled 2012-07-01: qty 5

## 2012-07-01 MED ORDER — SODIUM CHLORIDE 0.9 % IR SOLN
Status: DC | PRN
  Administered 2012-07-01: 3000 mL
  Administered 2012-07-01: 1000 mL

## 2012-07-01 MED ORDER — ONDANSETRON HCL 4 MG/2ML IJ SOLN
4.0000 mg | Freq: Once | INTRAMUSCULAR | Status: AC | PRN
  Administered 2012-07-01: 4 mg via INTRAVENOUS

## 2012-07-01 SURGICAL SUPPLY — 68 items
ANCHOR SUPER QUICK (Anchor) ×2 IMPLANT
BANDAGE ELASTIC 4 VELCRO ST LF (GAUZE/BANDAGES/DRESSINGS) ×2 IMPLANT
BANDAGE ELASTIC 6 VELCRO ST LF (GAUZE/BANDAGES/DRESSINGS) ×2 IMPLANT
BANDAGE ESMARK 6X9 LF (GAUZE/BANDAGES/DRESSINGS) ×1 IMPLANT
BLADE SAGITTAL 25.0X1.19X90 (BLADE) ×2 IMPLANT
BLADE SAW SAG 90X13X1.27 (BLADE) ×2 IMPLANT
BLADE SURG 10 STRL SS (BLADE) ×2 IMPLANT
BNDG ESMARK 6X9 LF (GAUZE/BANDAGES/DRESSINGS) ×2
BONE CEMENT GENTAMICIN (Cement) ×4 IMPLANT
BOWL SMART MIX CTS (DISPOSABLE) ×2 IMPLANT
CEMENT BONE GENTAMICIN 40 (Cement) ×2 IMPLANT
CLOTH BEACON ORANGE TIMEOUT ST (SAFETY) ×2 IMPLANT
COVER BACK TABLE 24X17X13 BIG (DRAPES) IMPLANT
COVER SURGICAL LIGHT HANDLE (MISCELLANEOUS) ×2 IMPLANT
CUFF TOURNIQUET SINGLE 34IN LL (TOURNIQUET CUFF) ×2 IMPLANT
CUFF TOURNIQUET SINGLE 44IN (TOURNIQUET CUFF) IMPLANT
DRAPE INCISE IOBAN 66X45 STRL (DRAPES) ×2 IMPLANT
DRAPE ORTHO SPLIT 77X108 STRL (DRAPES) ×2
DRAPE SURG ORHT 6 SPLT 77X108 (DRAPES) ×2 IMPLANT
DRAPE U-SHAPE 47X51 STRL (DRAPES) ×2 IMPLANT
DRSG ADAPTIC 3X8 NADH LF (GAUZE/BANDAGES/DRESSINGS) IMPLANT
DRSG PAD ABDOMINAL 8X10 ST (GAUZE/BANDAGES/DRESSINGS) ×2 IMPLANT
DURAPREP 26ML APPLICATOR (WOUND CARE) ×2 IMPLANT
ELECT REM PT RETURN 9FT ADLT (ELECTROSURGICAL) ×2
ELECTRODE REM PT RTRN 9FT ADLT (ELECTROSURGICAL) ×1 IMPLANT
EVACUATOR 1/8 PVC DRAIN (DRAIN) ×2 IMPLANT
FACESHIELD LNG OPTICON STERILE (SAFETY) ×4 IMPLANT
FLOSEAL 10ML (HEMOSTASIS) IMPLANT
GLOVE BIO SURGEON STRL SZ 6.5 (GLOVE) ×4 IMPLANT
GLOVE BIO SURGEON STRL SZ7 (GLOVE) ×2 IMPLANT
GLOVE BIOGEL PI IND STRL 6.5 (GLOVE) ×2 IMPLANT
GLOVE BIOGEL PI IND STRL 7.0 (GLOVE) ×1 IMPLANT
GLOVE BIOGEL PI IND STRL 8 (GLOVE) ×2 IMPLANT
GLOVE BIOGEL PI INDICATOR 6.5 (GLOVE) ×2
GLOVE BIOGEL PI INDICATOR 7.0 (GLOVE) ×1
GLOVE BIOGEL PI INDICATOR 8 (GLOVE) ×2
GLOVE ORTHO TXT STRL SZ7.5 (GLOVE) ×2 IMPLANT
GLOVE SURG ORTHO 8.0 STRL STRW (GLOVE) ×2 IMPLANT
GOWN PREVENTION PLUS XLARGE (GOWN DISPOSABLE) ×4 IMPLANT
GOWN PREVENTION PLUS XXLARGE (GOWN DISPOSABLE) ×2 IMPLANT
GOWN STRL NON-REIN LRG LVL3 (GOWN DISPOSABLE) IMPLANT
HANDPIECE INTERPULSE COAX TIP (DISPOSABLE) ×1
HOOD PEEL AWAY FACE SHEILD DIS (HOOD) ×2 IMPLANT
IMMOBILIZER KNEE 22 UNIV (SOFTGOODS) ×2 IMPLANT
KIT BASIN OR (CUSTOM PROCEDURE TRAY) ×2 IMPLANT
KIT ROOM TURNOVER OR (KITS) ×2 IMPLANT
MANIFOLD NEPTUNE II (INSTRUMENTS) ×2 IMPLANT
NEEDLE 22X1 1/2 (OR ONLY) (NEEDLE) IMPLANT
NS IRRIG 1000ML POUR BTL (IV SOLUTION) ×2 IMPLANT
PACK TOTAL JOINT (CUSTOM PROCEDURE TRAY) ×2 IMPLANT
PAD ARMBOARD 7.5X6 YLW CONV (MISCELLANEOUS) ×4 IMPLANT
PAD CAST 4YDX4 CTTN HI CHSV (CAST SUPPLIES) IMPLANT
PADDING CAST COTTON 4X4 STRL (CAST SUPPLIES)
PADDING CAST COTTON 6X4 STRL (CAST SUPPLIES) IMPLANT
SET HNDPC FAN SPRY TIP SCT (DISPOSABLE) ×1 IMPLANT
SPONGE GAUZE 4X4 12PLY (GAUZE/BANDAGES/DRESSINGS) IMPLANT
STAPLER VISISTAT 35W (STAPLE) ×2 IMPLANT
SUCTION FRAZIER TIP 10 FR DISP (SUCTIONS) ×2 IMPLANT
SUT ETHIBOND NAB CT1 #1 30IN (SUTURE) ×6 IMPLANT
SUT VIC AB 0 CT1 27 (SUTURE) ×1
SUT VIC AB 0 CT1 27XBRD ANBCTR (SUTURE) ×1 IMPLANT
SUT VIC AB 2-0 CT1 27 (SUTURE) ×2
SUT VIC AB 2-0 CT1 TAPERPNT 27 (SUTURE) ×2 IMPLANT
SYR CONTROL 10ML LL (SYRINGE) IMPLANT
TOWEL OR 17X24 6PK STRL BLUE (TOWEL DISPOSABLE) ×2 IMPLANT
TOWEL OR 17X26 10 PK STRL BLUE (TOWEL DISPOSABLE) ×2 IMPLANT
TRAY FOLEY CATH 14FR (SET/KITS/TRAYS/PACK) ×2 IMPLANT
WATER STERILE IRR 1000ML POUR (IV SOLUTION) ×4 IMPLANT

## 2012-07-01 NOTE — Progress Notes (Signed)
Orthopedic Tech Progress Note Patient Details:  Briana [name] Aug 30, 1931 [id number]  Patient ID: [name], female   DOB: Feb 11, 1931, 78 y.o.   MRN: [id number] Trapeze bar patient helper  Nikki Dom 07/01/2012, 4:28 PM

## 2012-07-01 NOTE — Anesthesia Postprocedure Evaluation (Signed)
  Anesthesia Post-op Note  Patient: Briana [name]  Procedure(s) Performed: Procedure(s) (LRB) with comments: TOTAL KNEE ARTHROPLASTY (Right) - right total knee arthroplasty  Patient Location: PACU  Anesthesia Type: General and GA combined with regional for post-op pain  Level of Consciousness: awake, alert  and oriented  Airway and Oxygen Therapy: Patient Spontanous Breathing and Patient connected to nasal cannula oxygen  Post-op Pain: mild  Post-op Assessment: Post-op Vital signs reviewed and Patient's Cardiovascular Status Stable  Post-op Vital Signs: stable  Complications: No apparent anesthesia complications

## 2012-07-01 NOTE — Op Note (Signed)
Dictated [phone]

## 2012-07-01 NOTE — Interval H&P Note (Signed)
History and Physical Interval Note:  07/01/2012 10:03 AM  Briana [name]  has presented today for surgery, with the diagnosis of RIGHT KNEE DJD  The various methods of treatment have been discussed with the patient and family. After consideration of risks, benefits and other options for treatment, the patient has consented to  Procedure(s) (LRB) with comments: TOTAL KNEE ARTHROPLASTY (Right) as a surgical intervention .  The patient's history has been reviewed, patient examined, no change in status, stable for surgery.  I have reviewed the patient's chart and labs.  Questions were answered to the patient's satisfaction.     Jaxin Fulfer JR,W D

## 2012-07-01 NOTE — Preoperative (Signed)
Beta Blockers   Reason not to administer Beta Blockers:Not Applicable 

## 2012-07-01 NOTE — Anesthesia Procedure Notes (Addendum)
Procedure Name: Intubation Date/Time: 07/01/2012 11:00 AM Performed by: Gayla Medicus Pre-anesthesia Checklist: Patient identified, Timeout performed, Emergency Drugs available, Suction available and Patient being monitored Patient Re-evaluated:Patient Re-evaluated prior to inductionOxygen Delivery Method: Circle system utilized Preoxygenation: Pre-oxygenation with 100% oxygen Intubation Type: IV induction Ventilation: Oral airway inserted - appropriate to patient size and Mask ventilation without difficulty Laryngoscope Size: Mac and 3 Grade View: Grade I Tube type: Oral Tube size: 7.0 mm Number of attempts: 1 Airway Equipment and Method: Stylet Placement Confirmation: ETT inserted through vocal cords under direct vision,  positive ETCO2 and breath sounds checked- equal and bilateral Secured at: 21 cm Tube secured with: Tape Dental Injury: Teeth and Oropharynx as per pre-operative assessment     Anesthesia Regional Block:  Femoral nerve block  Pre-Anesthetic Checklist: ,, timeout performed, Correct Patient, Correct Site, Correct Laterality, Correct Procedure, Correct Position, site marked, Risks and benefits discussed,  Surgical consent,  Pre-op evaluation,  At surgeon's request and post-op pain management  Laterality: Right  Prep: chloraprep       Needles:   Needle Type: Echogenic Stimulator Needle     Needle Length:cm 9 cm Needle Gauge: 22 and 22 G    Additional Needles:  Procedures: ultrasound guided and nerve stimulator Femoral nerve block Narrative:  Start time: 07/01/2012 10:00 AM End time: 07/01/2012 10:10 AM  Performed by: Personally   Additional Notes: 30 cc 0.5% marcaine with 1:200 Epi.  Kipp Brood, MD

## 2012-07-01 NOTE — Progress Notes (Signed)
UR COMPLETED  

## 2012-07-01 NOTE — H&P (View-Only) (Signed)
  Follow-up right knee osteoarthritis.  HPI: The patient is a 78 year old female with a history of right knee osteoarthritis. Also with hypertension, high cholesterol, and rheumatoid arthritis. Here today to discuss possible right knee total arthroplasty. She has failed conservative treatments with p.o. NSAIDs, pain medication, corticosteroid injection, visco-supplementation, and knee arthroscopy. The knee pain is significantly affecting her quality of life and activities of daily living. It will awaken her from sleep at night.   REVIEW OF SYSTEMS:    A 10-point review of systems obtained and positive for cataracts, glasses/contacts, dentures, bronchitis, high blood pressure, asthma. PAST MEDICAL HISTORY:    Right knee osteoarthritis, rheumatoid arthritis, hypertension, high cholesterol, asthma.  PAST SURGICAL HISTORY:    Knee arthroscopy, rotator cuff repair, and a foot surgery.  CURRENT MEDICATION:   Amlodipine Besylate 10 mg, simvastatin 40 mg, atenolol 100 mg, Temazepam 15 mg, methotrexate 2.5 mg, ibuprofen 600 mg, folic acid 1 mg. She is not currently on any blood thinners. She states she has drug allergies to lisinopril, verapamil, hydrochlorothiazide, Dilacoran. FAMILY HISTORY:   Positive in her mother for heart disease, heart attack and high blood pressure. Father with heart disease and heart attack. Brother with heart disease and cancer. Sister with high blood pressure. Grandparents with high blood pressure. Her children have heart disease, heart attack, high blood pressure, and diabetes.  SOCIAL HISTORY:   The patient is a nonsmoker, nondrinker. She is widowed and retired. Lives in a one story residence with one other person.   EXAMINATION: The patient is seated in the exam room in no acute distress. Alert and oriented. Appears appropriate age. Height 5'4", weight 172 pounds. BMI calculated at 29.5. Vital signs: temperature 97.4, pulse 55, respiration 18, blood pressure 166/81. HEENT:  pupils are equal, round and reactive to light. She does have upper and lower dentures. Neck is supple with good range of motion. Chest and lungs clear to auscultation bilaterally. Normal breath sounds. Normal effort. Cardiac: regular rate and rhythm. No signs of murmurs. Abdomen: soft, nontender, positive bowel sounds in all 4 quadrants. Neuro: cranial nerves grossly intact. Skin: no signs of rashes or lesions. Rectal/breast exam not indicated for surgery. Musculoskeletal: examination of right lower extremity shows she is neurovascularly intact. She has medial and lateral joint line tenderness to the right knee in a valgum alignment. Positive patellofemoral crepitus. No erythema, warmth or effusion. Stable ligamentous testing. Dorsiflexion and plantar flexion of the ankle intact. No calf tenderness with palpation.    IMAGING: No new images obtained today. X-rays taken on 06-23-11 show a genu valgum alignment and tricompartmental DJD.   ASSESSMENT:   1.  Right knee osteoarthritis end stage.  2.  Rheumatoid arthritis. 3.  High cholesterol.  4.  Hypertension.  5.  Asthma.   PLAN: The risks and benefits of right total knee arthroplasty were discussed again today and patient wishes to proceed. She has a preadmission appointment on 06-27-12. Surgery scheduled for  07-01-12. She has obtained preoperative clearance from her primary care physician, Dr. Debby Bud. Also discussed with Dr. Lendon Colonel, her rheumatologist's office, her methotrexate. She is to discontinue use one week prior to and 2 weeks following surgery. This was discussed with the patient today. She was given preoperative instructions. Also discussed postoperative DVT prophylaxis with Lovenox, perioperative antibiotics with Ancef. If she has any other questions or concerns prior to surgery may contact our office. With her history of rheumatoid arthritis patient would benefit from antibiotic impregnated cement.  Margart Sickles, PA-C

## 2012-07-01 NOTE — Progress Notes (Addendum)
Clinical Social Work Department CLINICAL SOCIAL WORK PLACEMENT NOTE 07/01/2012  Patient:  Briana [name], Briana [name]  Account Number:  [id number] Admit date:  07/01/2012  Clinical Social Worker:  Jacelyn Grip  Date/time:  07/01/2012 04:23 PM  Clinical Social Work is seeking post-discharge placement for this patient at the following level of care:   SKILLED NURSING   (*CSW will update this form in Epic as items are completed)     Patient/family provided with [facility] Department of Clinical Social Work's list of facilities offering this level of care within the geographic area requested by the patient (or if unable, by the patient's family).    Patient/family informed of their freedom to choose among providers that offer the needed level of care, that participate in Medicare, Medicaid or managed care program needed by the patient, have an available bed and are willing to accept the patient.    Patient/family informed of [facility]' ownership interest in [facility], as well as of the fact that they are under no obligation to receive care at this facility.  PASARR submitted to EDS on 07/01/2012 PASARR number received from EDS on 07/01/2012  FL2 transmitted to all facilities in geographic area requested by pt/family on  07/01/2012 FL2 transmitted to all facilities within larger geographic area on   Patient informed that his/her managed care company has contracts with or will negotiate with  certain facilities, including the following:     Patient/family informed of bed offers received: 07/01/12  Patient chooses bed at [facility] Physician recommends and patient chooses bed at    Patient to be transferred to [facility]  on  07/04/12 Patient to be transferred to facility by Ambulance Sharin Mons)  The following physician request were entered in Epic:   Additional Comments: Pt planning for [facility] at discharge.  Patient and family are pleased with d/c plan. Notified SNF and  pt's nurse - Rella Larve of d/c plan.  Lorri Frederick. [place]  [phone]  '

## 2012-07-01 NOTE — Clinical Social Work Psychosocial (Signed)
     Clinical Social Work Department BRIEF PSYCHOSOCIAL ASSESSMENT 07/01/2012  Patient:  Briana [name], Briana [name]     Account Number:  [id number]     Admit date:  07/01/2012  Clinical Social Worker:  Jacelyn Grip  Date/Time:  07/01/2012 04:11 PM  Referred by:  Physician  Date Referred:  07/01/2012 Referred for  SNF Placement   Other Referral:   Interview type:  Patient Other interview type:   patient son at bedside    PSYCHOSOCIAL DATA Living Status:  OTHER RELATIVE Admitted from facility:   Level of care:   Primary support name:  Jake Shark Ternes/son Primary support relationship to patient:  CHILD, ADULT Degree of support available:   strong    CURRENT CONCERNS Current Concerns  Post-Acute Placement   Other Concerns:    SOCIAL WORK ASSESSMENT / PLAN CSW received referral for pt needing snf placement following total knee replacement. CSW met with pt and pt son at bedside, introduced self and explained role. Pt reports that surgery went well, but she was experiencing some nausea at this time. CSW discussed option of rehab at [facility] and pt reports that she was planning to go to [facility] for rehab at discharge. Pt agreeable to initiation of SNF search to [facility]. Pt son reports that MD office was communicating with [facility] prior to pt surgery. Pt and pt son agreeable to plan. CSW sent pt clinicals to [facility] via [facility]. CSW to continue to follow and facilitate pt discharge needs when pt medically ready for discharge.   Assessment/plan status:  Psychosocial Support/Ongoing Assessment of Needs Other assessment/ plan:   discharge planning   Information/referral to community resources:   Referral to [facility]    PATIENTS/FAMILYS RESPONSE TO PLAN OF CARE: Pt alert and oriented and pleasant. Pt and pt son appreciative of CSW support and assistance. Pt hopeful for [facility] for rehab when medically stable for hospital.

## 2012-07-01 NOTE — Anesthesia Preprocedure Evaluation (Addendum)
Anesthesia Evaluation  Patient identified by MRN, date of birth, ID band Patient awake    Reviewed: Allergy & Precautions, H&P , NPO status , Patient's Chart, lab work & pertinent test results, reviewed documented beta blocker date and time   History of Anesthesia Complications Negative for: history of anesthetic complications  Airway Mallampati: II TM Distance: >3 FB Neck ROM: Full    Dental  (+) Edentulous Upper, Edentulous Lower and Dental Advisory Given   Pulmonary neg pulmonary ROS,          Cardiovascular Exercise Tolerance: Good hypertension, Pt. on home beta blockers and Pt. on medications Rhythm:Regular     Neuro/Psych negative neurological ROS     GI/Hepatic negative GI ROS, Neg liver ROS,   Endo/Other  negative endocrine ROS  Renal/GU negative Renal ROS  negative genitourinary   Musculoskeletal  (+) Arthritis -, Rheumatoid disorders,    Abdominal (+) + obese,   Peds  Hematology negative hematology ROS (+)   Anesthesia Other Findings   Reproductive/Obstetrics negative OB ROS                           Anesthesia Physical Anesthesia Plan  ASA: III  Anesthesia Plan: General and Regional   Post-op Pain Management: MAC Combined w/ Regional for Post-op pain   Induction: Intravenous  Airway Management Planned: Oral ETT  Additional Equipment:   Intra-op Plan:   Post-operative Plan: Extubation in OR  Informed Consent: I have reviewed the patients History and Physical, chart, labs and discussed the procedure including the risks, benefits and alternatives for the proposed anesthesia with the patient or authorized representative who has indicated his/her understanding and acceptance.   Dental advisory given  Plan Discussed with: CRNA and Anesthesiologist  Anesthesia Plan Comments:         Anesthesia Quick Evaluation

## 2012-07-01 NOTE — Progress Notes (Signed)
Orthopedic Tech Progress Note Patient Details:  Briana [name] 1931-10-03 [id number]  CPM Right Knee CPM Right Knee: On Right Knee Flexion (Degrees): 60  Right Knee Extension (Degrees): 0    Almeter Westhoff T 07/01/2012, 3:07 PM

## 2012-07-01 NOTE — Transfer of Care (Signed)
Immediate Anesthesia Transfer of Care Note  Patient: Briana [name]  Procedure(s) Performed: Procedure(s) (LRB) with comments: TOTAL KNEE ARTHROPLASTY (Right) - right total knee arthroplasty  Patient Location: PACU  Anesthesia Type: General  Level of Consciousness: awake, alert  and oriented  Airway & Oxygen Therapy: Patient Spontanous Breathing and Patient connected to nasal cannula oxygen  Post-op Assessment: Report given to PACU RN, Post -op Vital signs reviewed and stable and Patient moving all extremities X 4  Post vital signs: Reviewed and stable  Complications: No apparent anesthesia complications

## 2012-07-01 NOTE — Brief Op Note (Signed)
07/01/2012  1:22 PM  PATIENT:  Arnie M Cantrelle  78 y.o. female  PRE-OPERATIVE DIAGNOSIS:  RIGHT KNEE DJD  POST-OPERATIVE DIAGNOSIS:  RIGHT KNEE DJD  PROCEDURE:  Procedure(s) (LRB) with comments: TOTAL KNEE ARTHROPLASTY (Right) - right total knee arthroplasty  SURGEON:  Surgeon(s) and Role:    * W D Carloyn Manner., MD - Primary  PHYSICIAN ASSISTANT:   ASSISTANTS: Margart Sickles, PA-C   ANESTHESIA:   regional and general  EBL:  Total I/O In: 2200 [I.V.:2200] Out: 400 [Urine:400]  BLOOD ADMINISTERED:none  DRAINS: hemovac right knee self suction   LOCAL MEDICATIONS USED:  NONE  SPECIMEN:  No Specimen  DISPOSITION OF SPECIMEN:  N/A  COUNTS:  YES  TOURNIQUET:   Total Tourniquet Time Documented: Thigh (Right) - 56 minutes  DICTATION: .Other Dictation: Dictation Number   PLAN OF CARE: Admit to inpatient   PATIENT DISPOSITION:  PACU - hemodynamically stable.   Delay start of Pharmacological VTE agent (>24hrs) due to surgical blood loss or risk of bleeding: yes

## 2012-07-01 NOTE — Progress Notes (Signed)
Orthopedic Tech Progress Note Patient Details:  ZULA HOVSEPIAN 1931-10-10 [id number]  Patient ID: [name], female   DOB: 1931/05/01, 78 y.o.   MRN: [id number] Viewed order from doctor's order list  Nikki Dom 07/01/2012, 4:28 PM

## 2012-07-02 LAB — CBC
MCH: 31.7 pg (ref 26.0–34.0)
Platelets: 161 10*3/uL (ref 150–400)
RBC: 3.09 MIL/uL — ABNORMAL LOW (ref 3.87–5.11)
WBC: 9.3 10*3/uL (ref 4.0–10.5)

## 2012-07-02 LAB — BASIC METABOLIC PANEL
CO2: 27 mEq/L (ref 19–32)
Calcium: 8.5 mg/dL (ref 8.4–10.5)
Potassium: 3.3 mEq/L — ABNORMAL LOW (ref 3.5–5.1)
Sodium: 132 mEq/L — ABNORMAL LOW (ref 135–145)

## 2012-07-02 MED ORDER — INFLUENZA VIRUS VACC SPLIT PF IM SUSP
0.5000 mL | INTRAMUSCULAR | Status: AC
  Administered 2012-07-03: 0.5 mL via INTRAMUSCULAR
  Filled 2012-07-02: qty 0.5

## 2012-07-02 MED ORDER — ATORVASTATIN CALCIUM 20 MG PO TABS
20.0000 mg | ORAL_TABLET | Freq: Every day | ORAL | Status: DC
  Administered 2012-07-02: 20 mg via ORAL
  Filled 2012-07-02 (×4): qty 1

## 2012-07-02 MED ORDER — POTASSIUM CHLORIDE 20 MEQ PO PACK
40.0000 meq | PACK | Freq: Once | ORAL | Status: AC
  Administered 2012-07-02: 40 meq via ORAL
  Filled 2012-07-02: qty 2

## 2012-07-02 NOTE — Evaluation (Signed)
Physical Therapy Evaluation Patient Details Name: Briana [name] MRN: [id number] DOB: November 14, 1930 Today's Date: 07/02/2012 Time: 4696-2952 PT Time Calculation (min): 38 min  PT Assessment / Plan / Recommendation Clinical Impression  Pt is a 78 y/o female s/p R TKA.  Pt lives at home with her son who is a Dealer.  Pt will benefit from short term SNF placement to improve mobility for eventual return to home.     PT Assessment  Patient needs continued PT services    Follow Up Recommendations  Skilled nursing facility;Supervision/Assistance - 24 hour    Barriers to Discharge Decreased caregiver support Son is disabled and unable to assist pt.     Equipment Recommendations  Rolling walker with 5" wheels    Recommendations for Other Services OT consult   Frequency 7X/week    Precautions / Restrictions Precautions Precautions: Knee Precaution Booklet Issued: No Required Braces or Orthoses: Knee Immobilizer - Right Knee Immobilizer - Right: On when out of bed or walking Restrictions Weight Bearing Restrictions: Yes RLE Weight Bearing: Weight bearing as tolerated   Pertinent Vitals/Pain Pt reporting pain 4/10 in R knee.  Pre-medicated.       Mobility  Bed Mobility Bed Mobility: Supine to Sit;Sitting - Scoot to Edge of Bed Supine to Sit: 3: Mod assist;HOB flat;With rails Supine to Sit: Patient Percentage: 60% Sitting - Scoot to Edge of Bed: 3: Mod assist Details for Bed Mobility Assistance: Assist for R LE.  Step by step cues for technique including use of rail.  Transfers Transfers: Sit to Stand;Stand to Sit;Stand Pivot Transfers Sit to Stand: 1: +2 Total assist;From bed;With upper extremity assist;From chair/3-in-1 (two trials) Sit to Stand: Patient Percentage: 50% Stand to Sit: 1: +2 Total assist;To chair/3-in-1 Stand to Sit: Patient Percentage: 50% Stand Pivot Transfers: 1: +2 Total assist Stand Pivot Transfers: Patient Percentage: 50% Details for Transfer  Assistance: Assist To initiate standing and control descent secondary to pain and weakness in R LE.   Ambulation/Gait Ambulation/Gait Assistance: Not tested (comment) Ambulation/Gait Assistance Details: Pt unable to tolerate secondary to nausea.  Wheelchair Mobility Wheelchair Mobility: No    Exercises Total Joint Exercises Ankle Circles/Pumps: AROM;Both;10 reps;Supine Quad Sets: Right;5 reps;Seated;Strengthening Heel Slides: 5 reps;Right;Supine;AAROM Goniometric ROM: 0-50 degrees AAROM in R knee.  ROM limited by pain.    PT Diagnosis: Difficulty walking;Generalized weakness;Acute pain  PT Problem List: Decreased range of motion;Decreased strength;Decreased activity tolerance;Decreased balance;Decreased mobility;Decreased knowledge of use of DME;Decreased knowledge of precautions;Obesity;Pain PT Treatment Interventions: DME instruction;Stair training;Gait training;Functional mobility training;Therapeutic exercise;Therapeutic activities;Patient/family education   PT Goals Acute Rehab PT Goals PT Goal Formulation: With patient/family Time For Goal Achievement: 07/09/12 Potential to Achieve Goals: Fair Pt will go Supine/Side to Sit: with supervision PT Goal: Supine/Side to Sit - Progress: Goal set today Pt will Sit at Edge of Bed: with supervision PT Goal: Sit at Edge Of Bed - Progress: Goal set today Pt will go Sit to Supine/Side: with supervision PT Goal: Sit to Supine/Side - Progress: Goal set today Pt will go Sit to Stand: with supervision PT Goal: Sit to Stand - Progress: Goal set today Pt will go Stand to Sit: with supervision PT Goal: Stand to Sit - Progress: Goal set today Pt will Transfer Bed to Chair/Chair to Bed: with supervision PT Transfer Goal: Bed to Chair/Chair to Bed - Progress: Goal set today Pt will Ambulate: 51 - 150 feet;with supervision;with least restrictive assistive device PT Goal: Ambulate - Progress: Goal set today Pt will Perform Home  Exercise Program:  with min assist PT Goal: Perform Home Exercise Program - Progress: Goal set today  Visit Information  Last PT Received On: 07/02/12 Assistance Needed: +2    Subjective Data  Subjective: Agree to PT eval.  Patient Stated Goal: Walk without pain   Prior Functioning  Home Living Lives With: Son Available Help at Discharge: Skilled Nursing Facility Type of Home: House Home Access: Stairs to enter Secretary/administrator of Steps: 4 Entrance Stairs-Rails: Right Home Layout: One level Bathroom Shower/Tub: Engineer, manufacturing systems: Handicapped height Bathroom Accessibility: Yes Home Adaptive Equipment: None Prior Function Level of Independence: Independent Able to Take Stairs?: Yes Driving: Yes Vocation: Retired    IT consultant  Overall Cognitive Status: Appears within functional limits for tasks assessed/performed Arousal/Alertness: Awake/alert Orientation Level: Appears intact for tasks assessed;Oriented X4 / Intact Behavior During Session: [facility] for tasks performed    Extremity/Trunk Assessment Left Upper Extremity Assessment LUE ROM/Strength/Tone: Within functional levels Right Lower Extremity Assessment RLE ROM/Strength/Tone: Deficits;Due to pain RLE ROM/Strength/Tone Deficits: Limited ROM and strength in R Knee secondary to pain  Left Lower Extremity Assessment LLE ROM/Strength/Tone: Within functional levels Trunk Assessment Trunk Assessment: Normal   Balance Balance Balance Assessed: Yes Static Sitting Balance Static Sitting - Balance Support: Feet supported;Bilateral upper extremity supported Static Sitting - Level of Assistance: 4: Min assist Static Sitting - Comment/# of Minutes: Pt sat on EOB approximately 10 minutes with c/o nausea and dizziness.  Symptoms improved after sitting for several minutes but did not resolve.    End of Session CPM Right Knee CPM Right Knee: Off  GP     Sweet Jarvis 07/02/2012, 1:54 PM Katalyn Matin L. Truxton Stupka DPT [phone]

## 2012-07-02 NOTE — Op Note (Signed)
NAMEZAKARI, [name] NO.:  [id number]  MEDICAL RECORD NO.:  [id number]  LOCATION:  [id number]                        FACILITY:  [facility]  PHYSICIAN:  Dyke Brackett, M.D.    DATE OF BIRTH:  December 18, 1930  DATE OF PROCEDURE:  07/01/2012 DATE OF DISCHARGE:                              OPERATIVE REPORT   PREOPERATIVE DIAGNOSES:  Severe rheumatoid arthritis with superimposed osteoarthritis with valgus deformity, right knee.  POSTOPERATIVE DIAGNOSES:  Severe rheumatoid arthritis with superimposed osteoarthritis with valgus deformity, right knee.  Partial avulsion patellar tendon with __________patellar tendon.  OPERATION:  Right total knee replacement (Sigma cemented mobile bearing knee with size 2.5 femur, tibia 10 mm bearing, 35 mm patella.  SURGEON:  Dyke Brackett, M.D.  ASSISTANT:  Margart Sickles, PA-C.  ANESTHESIA:  General with a nerve block.  TOURNIQUET TIME:  55 minutes.  DESCRIPTION OF PROCEDURE:  Sterile prep and drape.  Exsanguination of leg, inflation to 350.  Straight skin incision with medial parapatellar approach to the knee made.  The knee was severely worn with a valgus deformity.  Resected 10 mm of the distal femur with a 3-degree valgus cut followed by about 6-7 mm below the least diseased medial compartment.  Extension gap was measured at 10 mm.  We then sized the femur to be 2.5.  Placed the appropriate cutting block, set the flexion gap with the appropriate rotation with a 10 mm gap.  Pins were placed followed by placement of the anterior-posterior chamfer cut block, was cut without difficulty.  We then cleaned out the posterior aspect of the knee for remnants of the menisci and released the PCL.  Flexion gap was then measured symmetric to the extension gap at 10 mm.  __________ was cut to the tibia after sizing it to size 2.5.  Trial tibial baseplate was placed followed by the box cut on the femur.  Trial reduction was carried out relative to  the tibia and femur.  Patella was cut leaving about 17 mm of native patella to leave a 35 mm all-poly patella.  All trials were placed.  Full extension was obtained. Excellent stability to varus, valgus, and anterior-posterior drawer was negligible.  The trial components were removed.  The bony surfaces were irrigated.  We then cemented the Sigma knee with the same size and placed tibia followed by femur patella.  We did use a trial bearing and allowed the cement to harden.  The bearing was removed.  The excess cement was noted in the posterior aspect of the knee.  Tourniquet was then released.  No excess bleeding was noted.  Small bleeders coagulated.  Final bearing placed.  Hemovac drain was placed in the lateral gutter.  Closure was affected on the capsule.  The patient's tissues were somewhat attenuated with a significant valgus deformity.  We did not have a complete rupture, but we did have to repair the patellar tendon with __________ anchor and some interrupted nonabsorbable sutures.  My feeling was that once we did __________ this tendon was maintained enough __________ postoperative routine.  Remainder of the closure was effected on the capsule with interrupted 2 Ethibond #1, zero and 2-0 Vicryl, and skin  clips.  Light compressive sterile dressing and knee immobilizer applied. Taken to recovery room in stable condition.     Dyke Brackett, M.D.     WDC/MEDQ  D:  07/01/2012  T:  07/02/2012  Job:  [id number]

## 2012-07-02 NOTE — Progress Notes (Signed)
Subjective: Doing well.  Pain controlled.   Objective: Vital signs in last 24 hours: Temp:  [97.6 F (36.4 C)-98.4 F (36.9 C)] 98.4 F (36.9 C) (09/21 0553) Pulse Rate:  [56-78] 78  (09/21 0553) Resp:  [9-18] 16  (09/21 0750) BP: (137-155)/(54-69) 144/54 mmHg (09/21 0553) SpO2:  [97 %-100 %] 97 % (09/21 0750) Weight:  [78.563 kg (173 lb 3.2 oz)] 78.563 kg (173 lb 3.2 oz) (09/21 0750)  Intake/Output from previous day: 09/20 0701 - 09/21 0700 In: 3452 [P.O.:240; I.V.:2950; IV Piggyback:262] Out: 4280 [Urine:3625; Drains:655] Intake/Output this shift:     Basename 07/02/12 0435  HGB 9.8*    Basename 07/02/12 0435  WBC 9.3  RBC 3.09*  HCT 28.2*  PLT 161    Basename 07/02/12 0435  NA 132*  K 3.3*  CL 97  CO2 27  BUN 8  CREATININE 0.72  GLUCOSE 142*  CALCIUM 8.5   No results found for this basename: LABPT:2,INR:2 in the last 72 hours  Exam:  Dressing c/d/i.  Calf nt, nvi.    Assessment/Plan: D/c dilaudid. Hypokalemia.  Give kcl 40 meq x 1 dose now.   Bed to recliner tid with assist.     Naida Sleight 07/02/2012, 10:33 AM

## 2012-07-02 NOTE — Progress Notes (Signed)
Courtesy note  Pt post-op #1 after right TKR. She is awake and alert. She reprots she is having a lot of pain. Reviewed with her the pain medications on order. Vitasls and labs look ok. Mild drop in Hgb.  Thanks for her good care. Will see socially tomorrow.

## 2012-07-03 LAB — CBC
HCT: 27 % — ABNORMAL LOW (ref 36.0–46.0)
Hemoglobin: 9.4 g/dL — ABNORMAL LOW (ref 12.0–15.0)
RBC: 2.97 MIL/uL — ABNORMAL LOW (ref 3.87–5.11)
WBC: 13.4 10*3/uL — ABNORMAL HIGH (ref 4.0–10.5)

## 2012-07-03 NOTE — Progress Notes (Signed)
Physical Therapy Treatment Patient Details Name: Briana [name] MRN: [id number] DOB: Aug 02, 1931 Today's Date: 07/03/2012 Time: 4098-1191 PT Time Calculation (min): 28 min  PT Assessment / Plan / Recommendation Comments on Treatment Session  Nausea limited session.  One son present for entire sesison.      Follow Up Recommendations  Skilled nursing facility;Supervision/Assistance - 24 hour    Barriers to Discharge        Equipment Recommendations  Rolling walker with 5" wheels    Recommendations for Other Services    Frequency 7X/week   Plan Discharge plan remains appropriate    Precautions / Restrictions Precautions Precautions: Knee Required Braces or Orthoses: Knee Immobilizer - Right Knee Immobilizer - Right: On when out of bed or walking Restrictions Weight Bearing Restrictions: Yes RLE Weight Bearing: Weight bearing as tolerated   Pertinent Vitals/Pain C/O nausea throughout session.  10/10 R knee pain with activity.  Also c/o L knee pain with activity.      Mobility  Bed Mobility Bed Mobility: Not assessed (pt up in chair) Transfers Transfers: Sit to Stand;Stand to Sit Sit to Stand: 3: Mod assist;With upper extremity assist;With armrests;From chair/3-in-1 Stand to Sit: 3: Mod assist;With upper extremity assist;With armrests;To elevated surface;To chair/3-in-1 Details for Transfer Assistance: Assist To initiate standing and control descent secondary to pain and weakness in R LE. VCs for proper technique.   Ambulation/Gait Ambulation/Gait Assistance: Not tested (comment);Other (comment) (Pt needed to use BSC and nauseated at present.  Will return ) Assistive device: Rolling walker    Exercises Total Joint Exercises Ankle Circles/Pumps: AROM;Both;10 reps;Seated Quad Sets: Right;Seated;Strengthening;10 reps Hip ABduction/ADduction: Right;10 reps;Seated (All exercies completed in recliner) Straight Leg Raises: AAROM;Right;10 reps;Seated Long Arc Quad:  AAROM;Strengthening;Right;10 reps;Seated Knee Flexion: Right;Seated Goniometric ROM: 52 degrees flexion R knee AA/ROM.  Pt still with bulky bandage on knee   PT Diagnosis:    PT Problem List:   PT Treatment Interventions:     PT Goals Acute Rehab PT Goals PT Goal Formulation: With patient/family Time For Goal Achievement: 07/09/12 Potential to Achieve Goals: Good Pt will go Supine/Side to Sit: with supervision PT Goal: Supine/Side to Sit - Progress: Other (comment) (NT this session) Pt will Sit at [facility] of Bed: with supervision PT Goal: Sit at Edge Of Bed - Progress: Other (comment) Pt will go Sit to Supine/Side: with supervision PT Goal: Sit to Supine/Side - Progress: Other (comment) Pt will go Sit to Stand: with supervision PT Goal: Sit to Stand - Progress: Progressing toward goal Pt will go Stand to Sit: with supervision PT Goal: Stand to Sit - Progress: Progressing toward goal Pt will Transfer Bed to Chair/Chair to Bed: with supervision PT Transfer Goal: Bed to Chair/Chair to Bed - Progress: Progressing toward goal Pt will Ambulate: 51 - 150 feet;with supervision;with least restrictive assistive device PT Goal: Ambulate - Progress: Progressing toward goal Pt will Perform Home Exercise Program: with min assist PT Goal: Perform Home Exercise Program - Progress: Progressing toward goal  Visit Information  Last PT Received On: 07/03/12 Assistance Needed: +2    Subjective Data  Subjective: I'm nauseated   Cognition  Overall Cognitive Status: Appears within functional limits for tasks assessed/performed Arousal/Alertness: Awake/alert Orientation Level: Appears intact for tasks assessed;Oriented X4 / Intact Behavior During Session: [facility] for tasks performed    Balance     End of Session PT - End of Session Equipment Utilized During Treatment: Gait belt;Right knee immobilizer Activity Tolerance: Other (comment) (Pt limited by nausea.  RN gavemeds  for nausea during  session) Patient left: in chair;with call bell/phone within reach (With NT pt assiste BSC at end of session.  RN and NT aware) Nurse Communication: Mobility status (and pt on BSC)   GP     Donnella Sham 07/03/2012, 10:25 AM Lavona [place], PT  [phone] 07/03/2012

## 2012-07-03 NOTE — Progress Notes (Signed)
07/03/12 1000  PT Visit Information  Last PT Received On 07/03/12  Assistance Needed +2  PT Time Calculation  PT Start Time 0940  PT Stop Time 0955  PT Time Calculation (min) 15 min  Subjective Data  Subjective I'm still nauseated  Precautions  Precautions Knee  Required Braces or Orthoses Knee Immobilizer - Right  Knee Immobilizer - Right On when out of bed or walking  Restrictions  Weight Bearing Restrictions Yes  RLE Weight Bearing WBAT  Cognition  Overall Cognitive Status Appears within functional limits for tasks assessed/performed  Arousal/Alertness Awake/alert  Orientation Level Appears intact for tasks assessed;Oriented X4 / Intact  Behavior During Session [facility] for tasks performed  Bed Mobility  Bed Mobility Sit to Supine  Sit to Supine 3: Mod assist  Details for Bed Mobility Assistance Assist for R LE.  Step by step cues for technique including use of rail.   Transfers  Transfers Sit to Stand;Stand to Sit  Sit to Stand 3: Mod assist;With upper extremity assist;With armrests;From chair/3-in-1  Stand to Sit 3: Mod assist;With upper extremity assist;With armrests;To elevated surface;To bed  Details for Transfer Assistance Needs VC's for technique  Ambulation/Gait  Ambulation/Gait Assistance 1: +2 Total assist  Ambulation/Gait: Patient Percentage 70%  Ambulation Distance (Feet) 4 Feet  Assistive device Rolling walker  Ambulation/Gait Assistance Details Very small steps. Difficulty unweighting RLE to take step  Gait Pattern Step-to pattern;Trunk flexed  Gait velocity very slow  General Gait Details Frequent VC's to stand up straight.  +2 needed for safety  Stairs No  Wheelchair Mobility  Wheelchair Mobility No  Balance  Balance Assessed No  PT - End of Session  Equipment Utilized During Treatment Gait belt;Right knee immobilizer  Activity Tolerance Other (comment) (limited by nausea)  Patient left in bed;with call bell/phone within reach;with family/visitor  present  Nurse Communication Mobility status  PT - Assessment/Plan  Comments on Treatment Session Nausea limited session.  One son present for entire sesison.    PT Plan Discharge plan remains appropriate  PT Frequency 7X/week  Follow Up Recommendations Skilled nursing facility  Equipment Recommended Rolling walker with 5" wheels  Acute Rehab PT Goals  PT Goal Formulation With patient/family  Time For Goal Achievement 07/09/12  Potential to Achieve Goals Good  Pt will go Supine/Side to Sit with supervision  Pt will Sit at [facility] of Bed with supervision  Pt will go Sit to Supine/Side with supervision  PT Goal: Sit to Supine/Side - Progress Progressing toward goal  Pt will go Sit to Stand with supervision  PT Goal: Sit to Stand - Progress Progressing toward goal  Pt will go Stand to Sit with supervision  PT Goal: Stand to Sit - Progress Progressing toward goal  Pt will Transfer Bed to Chair/Chair to Bed with supervision  PT Transfer Goal: Bed to Chair/Chair to Bed - Progress Progressing toward goal  Pt will Ambulate 51 - 150 feet;with supervision;with least restrictive assistive device  PT Goal: Ambulate - Progress Progressing toward goal  Pt will Perform Home Exercise Program with min assist  PT General Charges  $$ ACUTE PT VISIT 1 Procedure  PT Treatments  $Gait Training 8-22 mins   Orange Blossom, [place]  [phone] 07/03/2012

## 2012-07-03 NOTE — Progress Notes (Signed)
Subjective: Doing well.  Pain controlled. No complaints.   Objective: Vital signs in last 24 hours: Temp:  [98 F (36.7 C)-101.3 F (38.5 C)] 100.4 F (38 C) (09/22 0548) Pulse Rate:  [75-82] 79  (09/22 0548) Resp:  [16-18] 16  (09/22 0800) BP: (151-175)/(56-67) 151/57 mmHg (09/22 0548) SpO2:  [95 %-99 %] 99 % (09/22 0800)  Intake/Output from previous day: 09/21 0701 - 09/22 0700 In: -  Out: 50 [Drains:50] Intake/Output this shift: Total I/O In: 240 [P.O.:240] Out: -    Basename 07/03/12 0610 07/02/12 0435  HGB 9.4* 9.8*    Basename 07/03/12 0610 07/02/12 0435  WBC 13.4* 9.3  RBC 2.97* 3.09*  HCT 27.0* 28.2*  PLT 145* 161    Basename 07/02/12 0435  NA 132*  K 3.3*  CL 97  CO2 27  BUN 8  CREATININE 0.72  GLUCOSE 142*  CALCIUM 8.5   No results found for this basename: LABPT:2,INR:2 in the last 72 hours  Exam:  Wound looks good.  Staples intact.  No drainage or signs of infection.  Drain removed. Calf nt, nvi.  Assessment/Plan: Anticipate transfer to snf tomorrow.  Continue present care.   Alli Jasmer M 07/03/2012, 11:57 AM

## 2012-07-04 LAB — CBC
Hemoglobin: 9.8 g/dL — ABNORMAL LOW (ref 12.0–15.0)
MCH: 31.8 pg (ref 26.0–34.0)
MCV: 90.6 fL (ref 78.0–100.0)
RBC: 3.08 MIL/uL — ABNORMAL LOW (ref 3.87–5.11)

## 2012-07-04 MED ORDER — METHOCARBAMOL 500 MG PO TABS: 500.0000 mg | ORAL_TABLET | Freq: Four times a day (QID) | ORAL | Status: DC | PRN

## 2012-07-04 MED ORDER — OXYCODONE HCL 5 MG PO TABS: 5.0000 mg | ORAL_TABLET | ORAL | Status: DC | PRN

## 2012-07-04 MED ORDER — ENOXAPARIN SODIUM 30 MG/0.3ML ~~LOC~~ SOLN: 30.0000 mg | Freq: Two times a day (BID) | SUBCUTANEOUS | Status: DC

## 2012-07-04 NOTE — Discharge Summary (Signed)
Skilled nursing facilitySkilled nursing facilitySkilled nursing facilitySkilled nursing facility PATIENT ID: Briana [name]        MRN:  [id number]          DOB/AGE: [birth year] / 78 y.o.    DISCHARGE SUMMARY  ADMISSION DATE:    07/01/2012 DISCHARGE DATE:   07/04/2012   ADMISSION DIAGNOSIS: RIGHT KNEE DJD    DISCHARGE DIAGNOSIS:  RIGHT KNEE DJD    ADDITIONAL DIAGNOSIS: Active Problems:  * No active hospital problems. *   Past Medical History  Diagnosis Date  . ACUTE URIS OF UNSPECIFIED SITE 11/26/2008  . ALLERGIC RHINITIS 10/28/2007  . ARTHRITIS, KNEES, BILATERAL 10/28/2007  . BACK PAIN 04/12/2009  . BUNIONECTOMY, HX OF 10/28/2007  . CELLULITIS&ABSCESS OF HAND EXCEPT FINGERS&THUMB 10/23/2010  . HYPERLIPIDEMIA 10/28/2007  . HYPERTENSION 10/28/2007  . INSOMNIA UNSPECIFIED 10/31/2007  . MYOSITIS 10/22/2010  . NECK PAIN 12/03/2008  . Pain in joint, site unspecified 10/07/2010  . WRIST PAIN, LEFT 10/22/2010  . ALLERGIC ARTHRITIS OTHER SPECIFIED SITES 10/22/2010    OA + RA    PROCEDURE: Procedure(s): TOTAL KNEE ARTHROPLASTY Right  on 07/01/2012  CONSULTS:     HISTORY:  See H&P in chart  HOSPITAL COURSE:  Briana [name] is a 78 y.o. admitted on 07/01/2012 and found to have a diagnosis of RIGHT KNEE DJD.  After appropriate laboratory studies were obtained  they were taken to the operating room on 07/01/2012 and underwent Procedure(s): TOTAL KNEE ARTHROPLASTY Right.   They were given perioperative antibiotics:  Anti-infectives     Start     Dose/Rate Route Frequency Ordered Stop   07/01/12 1700   ceFAZolin (ANCEF) IVPB 1 g/50 mL premix        1 g 100 mL/hr over 30 Minutes Intravenous Every 6 hours 07/01/12 1433 07/02/12 0010   06/30/12 1445   ceFAZolin (ANCEF) IVPB 2 g/50 mL premix        2 g 100 mL/hr over 30 Minutes Intravenous 60 min pre-op 06/30/12 1445 07/01/12 1105        .  Tolerated the procedure well.  Placed with a foley intraoperatively.  Given Ofirmev at induction and  for 24 hours.    POD #1, allowed out of bed to a chair.  PT for ambulation and exercise program.  Foley D/C'd in morning.  IV saline locked.  O2 discontionued.  POD #2, continued PT and ambulation.   Hemovac pulled. .  The remainder of the hospital course was dedicated to ambulation and strengthening.   The patient was discharged on 3 Days Post-Op in  Stable condition.  Blood products given:none  DIAGNOSTIC STUDIES: Recent vital signs: Patient Vitals for the past 24 hrs:  BP Temp Temp src Pulse Resp SpO2  07/04/12 0504 144/52 mmHg 98.7 F (37.1 C) Oral 74  16  100 %  July 10, 2012 2108 142/70 mmHg 99.1 F (37.3 C) Oral 78  16  97 %  2012-07-10 1600 - - - - 16  99 %  Jul 10, 2012 1335 137/57 mmHg 100.2 F (37.9 C) Oral 76  16  97 %  07-10-2012 1200 - - - - 16  99 %       Recent laboratory studies:  Basename 07/04/12 0500 Jul 10, 2012 0610 07/02/12 0435 06/27/12 1225  WBC 13.2* 13.4* 9.3 7.7  HGB 9.8* 9.4* 9.8* 12.4  HCT 27.9* 27.0* 28.2* 35.5*  PLT 165 145* 161 198    Basename 07/02/12 0435 06/27/12 1225  NA 132* 142  K 3.3* 3.7  CL 97 103  CO2 27 30  BUN 8 17  CREATININE 0.72 0.77  GLUCOSE 142* 100*  CALCIUM 8.5 9.8   Lab Results  Component Value Date   INR 0.92 06/27/2012     Recent Radiographic Studies :  Dg Chest 2 View  06/27/2012  *RADIOLOGY REPORT*  Clinical Data: Hypertension, preoperative radiograph for total knee arthroplasty.  CHEST - 2 VIEW  Comparison: 03/29/2006  Findings: Aortic arch atherosclerosis.  Heart size within normal range.  No focal consolidation, pleural effusion, or pneumothorax. Right AC joint degenerative change.  No acute osseous finding.  IMPRESSION: No radiographic evidence of acute cardiopulmonary process.   Original Report Authenticated By: Waneta Martins, M.D.    X-ray Knee Right Port  07/01/2012  *RADIOLOGY REPORT*  Clinical Data: Postoperative arthroplasty  PORTABLE RIGHT KNEE - 1-2 VIEW  Comparison: None.  Findings: Frontal and lateral views  were obtained.  The patient is status post total knee replacement with tibial and femoral prosthetic components appearing well seated.  There is no fracture or dislocation.  Air within the joint is an expected postoperative finding.  There is a drain placed just anterior to the tibial component of the prosthesis.  There are overlying skin staples.  IMPRESSION: Prosthetic components appear well seated.  No fracture or dislocation.   Original Report Authenticated By: Arvin Collard. WOODRUFF III, M.D.     DISCHARGE INSTRUCTIONS:   DISCHARGE MEDICATIONS:     Medication List     As of 07/04/2012  8:22 AM    STOP taking these medications         ibuprofen 600 MG tablet   Commonly known as: ADVIL,MOTRIN      methotrexate 2.5 MG tablet   Commonly known as: RHEUMATREX      TAKE these medications         amLODipine 10 MG tablet   Commonly known as: NORVASC   Take 1 tablet (10 mg total) by mouth daily.      atenolol 100 MG tablet   Commonly known as: TENORMIN   Take 1 tablet (100 mg total) by mouth daily.      enoxaparin 30 MG/0.3ML injection   Commonly known as: LOVENOX   Inject 0.3 mLs (30 mg total) into the skin every 12 (twelve) hours.      fexofenadine 180 MG tablet   Commonly known as: ALLEGRA   Take 180 mg by mouth as needed. For allergies      folic acid 1 MG tablet   Commonly known as: FOLVITE   Take 1 mg by mouth daily.      methocarbamol 500 MG tablet   Commonly known as: ROBAXIN   Take 1 tablet (500 mg total) by mouth every 6 (six) hours as needed.      oxyCODONE 5 MG immediate release tablet   Commonly known as: Oxy IR/ROXICODONE   Take 1-2 tablets (5-10 mg total) by mouth every 4 (four) hours as needed.      simvastatin 40 MG tablet   Commonly known as: ZOCOR   Take 40 mg by mouth every evening.      temazepam 15 MG capsule   Commonly known as: RESTORIL   Take 1 capsule (15 mg total) by mouth at bedtime as needed for sleep.        FOLLOW UP VISIT:         Follow-up Information    Call CAFFREY JR,W D, MD. (to make an appointment on 07/14/12)  Contact information:   [facility] [address] [phone]          DISPOSITION:  SNF  CONDITION:  [name] 07/04/2012, 8:22 AM

## 2012-07-04 NOTE — Progress Notes (Addendum)
Physical Therapy Treatment Patient Details Name: Briana [name] MRN: [id number] DOB: [birth year] Today's Date: 07/04/2012 Time: 0925-0950 PT Time Calculation (min): 25 min  PT Assessment / Plan / Recommendation Comments on Treatment Session  Pain limited session and anxiety.      Follow Up Recommendations  Skilled nursing facility    Barriers to Discharge        Equipment Recommendations  Rolling walker with 5" wheels    Recommendations for Other Services    Frequency 7X/week   Plan Discharge plan remains appropriate    Precautions / Restrictions Precautions Precautions: Knee Required Braces or Orthoses: Knee Immobilizer - Right Knee Immobilizer - Right: On when out of bed or walking Restrictions Weight Bearing Restrictions: Yes RLE Weight Bearing: Weight bearing as tolerated   Pertinent Vitals/Pain 7/10    Mobility  Bed Mobility Bed Mobility: Sit to Supine Supine to Sit: 3: Mod assist;HOB flat;With rails Supine to Sit: Patient Percentage: 60% Sitting - Scoot to Edge of Bed: 3: Mod assist Details for Bed Mobility Assistance: Assist with rails and assist for RLE and VC's for body positioning to sit Transfers Transfers: Sit to Stand;Stand to Sit Sit to Stand: 3: Mod assist;With upper extremity assist;With armrests;From chair/3-in-1 Stand to Sit: 3: Mod assist;With upper extremity assist;With armrests;To elevated surface;To bed Details for Transfer Assistance: Patient very anxious and requires max VC's for upright posture Ambulation/Gait Ambulation/Gait Assistance: 1: +2 Total assist Ambulation/Gait: Patient Percentage: 80% Ambulation Distance (Feet): 10 Feet Assistive device: Rolling walker Gait Pattern: Step-to pattern;Trunk flexed Gait velocity: very slow General Gait Details: VC's for upright posture and sequencing Stairs: No Wheelchair Mobility Wheelchair Mobility: No    Exercises Total Joint Exercises Ankle Circles/Pumps: AROM;Both;10 reps;Supine Quad  Sets: Strengthening;10 reps;Right;Supine Heel Slides: Strengthening;10 reps;Right;Supine      PT Goals Acute Rehab PT Goals PT Goal Formulation: With patient/family Time For Goal Achievement: 07/09/12 Potential to Achieve Goals: Good Pt will go Supine/Side to Sit: with supervision PT Goal: Supine/Side to Sit - Progress: Progressing toward goal Pt will Sit at Edge of Bed: with supervision PT Goal: Sit at Edge Of Bed - Progress: Progressing toward goal Pt will go Sit to Supine/Side: with supervision Pt will go Sit to Stand: with supervision PT Goal: Sit to Stand - Progress: Progressing toward goal Pt will go Stand to Sit: with supervision PT Goal: Stand to Sit - Progress: Progressing toward goal Pt will Transfer Bed to Chair/Chair to Bed: with supervision Pt will Ambulate: 51 - 150 feet;with supervision;with least restrictive assistive device PT Goal: Ambulate - Progress: Progressing toward goal Pt will Perform Home Exercise Program: with min assist PT Goal: Perform Home Exercise Program - Progress: Progressing toward goal  Visit Information  Last PT Received On: 07/04/12 Assistance Needed: +2    Subjective Data  Subjective: I'm feeling rough   Cognition  Overall Cognitive Status: Appears within functional limits for tasks assessed/performed Arousal/Alertness: Awake/alert Orientation Level: Appears intact for tasks assessed;Oriented X4 / Intact Behavior During Session: [facility] for tasks performed    Balance  Balance Balance Assessed: No  End of Session PT - End of Session Equipment Utilized During Treatment: Gait belt;Right knee immobilizer Activity Tolerance: Patient limited by pain;Patient limited by fatigue Patient left: with call bell/phone within reach;with family/visitor present;in chair Nurse Communication: Mobility status;Patient requests pain meds   GP     Fabio Asa 07/04/2012, 10:20 AM Charlotte Crumb, PT DPT  [phone]

## 2012-07-04 NOTE — Progress Notes (Signed)
OT NOTE  OT spoke with CM Lupita Leash and pt is to d/c to SNF today. Ot will defer all OT needs to skilled at this time.    Harrel Carina Hanaford   OTR/L Pager: [phone] Office: [phone] .

## 2012-07-04 NOTE — Progress Notes (Signed)
Subjective: 3 Days Post-Op Procedure(s) (LRB): TOTAL KNEE ARTHROPLASTY (Right) Patient reports pain as mild.    Objective: Vital signs in last 24 hours: Temp:  [98.7 F (37.1 C)-100.2 F (37.9 C)] 98.7 F (37.1 C) (09/23 0504) Pulse Rate:  [74-78] 74  (09/23 0504) Resp:  [16] 16  (09/23 0504) BP: (137-144)/(52-70) 144/52 mmHg (09/23 0504) SpO2:  [97 %-100 %] 100 % (09/23 0504)  Intake/Output from previous day: 09/22 0701 - 09/23 0700 In: 960 [P.O.:960] Out: -  Intake/Output this shift:     Basename 07/04/12 0500 07/03/12 0610 07/02/12 0435  HGB 9.8* 9.4* 9.8*    Basename 07/04/12 0500 07/03/12 0610  WBC 13.2* 13.4*  RBC 3.08* 2.97*  HCT 27.9* 27.0*  PLT 165 145*    Basename 07/02/12 0435  NA 132*  K 3.3*  CL 97  CO2 27  BUN 8  CREATININE 0.72  GLUCOSE 142*  CALCIUM 8.5   No results found for this basename: LABPT:2,INR:2 in the last 72 hours  Neurovascular intact Sensation intact distally Intact pulses distally Dorsiflexion/Plantar flexion intact Incision: dressing C/D/I and no drainage No cellulitis present Compartment soft  Assessment/Plan: 3 Days Post-Op Procedure(s) (LRB): TOTAL KNEE ARTHROPLASTY (Right) Up with therapy Discharge to SNF rx in chart Lovenox teaching  Tala Eber, Ivin Booty 07/04/2012, 10:00 AM

## 2012-07-05 ENCOUNTER — Encounter (HOSPITAL_COMMUNITY): Payer: Self-pay | Admitting: Orthopedic Surgery

## 2012-08-08 DIAGNOSIS — I10 Essential (primary) hypertension: Secondary | ICD-10-CM

## 2012-08-08 DIAGNOSIS — Z471 Aftercare following joint replacement surgery: Secondary | ICD-10-CM

## 2012-08-08 DIAGNOSIS — Z96659 Presence of unspecified artificial knee joint: Secondary | ICD-10-CM

## 2012-08-08 DIAGNOSIS — M069 Rheumatoid arthritis, unspecified: Secondary | ICD-10-CM

## 2012-11-11 ENCOUNTER — Emergency Department (HOSPITAL_COMMUNITY)
Admission: EM | Admit: 2012-11-11 | Discharge: 2012-11-11 | Disposition: A | Discharge status: Home / Self Care | Payer: Medicare PPO | Attending: Emergency Medicine | Admitting: Emergency Medicine

## 2012-11-11 ENCOUNTER — Encounter (HOSPITAL_COMMUNITY): Payer: Self-pay | Admitting: Emergency Medicine

## 2012-11-11 ENCOUNTER — Other Ambulatory Visit: Payer: Self-pay

## 2012-11-11 ENCOUNTER — Emergency Department (HOSPITAL_COMMUNITY): Payer: Medicare PPO

## 2012-11-11 DIAGNOSIS — T50995A Adverse effect of other drugs, medicaments and biological substances, initial encounter: Secondary | ICD-10-CM | POA: Insufficient documentation

## 2012-11-11 DIAGNOSIS — J309 Allergic rhinitis, unspecified: Secondary | ICD-10-CM | POA: Insufficient documentation

## 2012-11-11 DIAGNOSIS — Z8739 Personal history of other diseases of the musculoskeletal system and connective tissue: Secondary | ICD-10-CM | POA: Insufficient documentation

## 2012-11-11 DIAGNOSIS — R220 Localized swelling, mass and lump, head: Secondary | ICD-10-CM | POA: Insufficient documentation

## 2012-11-11 DIAGNOSIS — Z79899 Other long term (current) drug therapy: Secondary | ICD-10-CM | POA: Insufficient documentation

## 2012-11-11 DIAGNOSIS — Z888 Allergy status to other drugs, medicaments and biological substances status: Secondary | ICD-10-CM | POA: Insufficient documentation

## 2012-11-11 DIAGNOSIS — R221 Localized swelling, mass and lump, neck: Secondary | ICD-10-CM | POA: Insufficient documentation

## 2012-11-11 DIAGNOSIS — Z791 Long term (current) use of non-steroidal anti-inflammatories (NSAID): Secondary | ICD-10-CM | POA: Insufficient documentation

## 2012-11-11 DIAGNOSIS — E785 Hyperlipidemia, unspecified: Secondary | ICD-10-CM | POA: Insufficient documentation

## 2012-11-11 DIAGNOSIS — Z8709 Personal history of other diseases of the respiratory system: Secondary | ICD-10-CM | POA: Insufficient documentation

## 2012-11-11 DIAGNOSIS — T7840XA Allergy, unspecified, initial encounter: Secondary | ICD-10-CM

## 2012-11-11 DIAGNOSIS — I10 Essential (primary) hypertension: Secondary | ICD-10-CM | POA: Insufficient documentation

## 2012-11-11 DIAGNOSIS — R0602 Shortness of breath: Secondary | ICD-10-CM | POA: Insufficient documentation

## 2012-11-11 LAB — CBC WITH DIFFERENTIAL/PLATELET
Basophils Absolute: 0 10*3/uL (ref 0.0–0.1)
Basophils Relative: 0 % (ref 0–1)
Eosinophils Relative: 1 % (ref 0–5)
HCT: 38.5 % (ref 36.0–46.0)
Lymphocytes Relative: 25 % (ref 12–46)
MCHC: 34.5 g/dL (ref 30.0–36.0)
MCV: 91 fL (ref 78.0–100.0)
Monocytes Absolute: 0.4 10*3/uL (ref 0.1–1.0)
Monocytes Relative: 4 % (ref 3–12)
RDW: 15.7 % — ABNORMAL HIGH (ref 11.5–15.5)

## 2012-11-11 LAB — BASIC METABOLIC PANEL
Calcium: 9 mg/dL (ref 8.4–10.5)
GFR calc Af Amer: 66 mL/min — ABNORMAL LOW (ref >90)
GFR calc non Af Amer: 57 mL/min — ABNORMAL LOW (ref >90)
Potassium: 3.2 mEq/L — ABNORMAL LOW (ref 3.5–5.1)
Sodium: 136 mEq/L (ref 135–145)

## 2012-11-11 MED ORDER — SODIUM CHLORIDE 0.9 % IV BOLUS (SEPSIS)
500.0000 mL | Freq: Once | INTRAVENOUS | Status: AC
  Administered 2012-11-11: 500 mL via INTRAVENOUS

## 2012-11-11 MED ORDER — METHYLPREDNISOLONE SODIUM SUCC 125 MG IJ SOLR
INTRAMUSCULAR | Status: AC
  Administered 2012-11-11: 125 mg via INTRAVENOUS
  Filled 2012-11-11: qty 2

## 2012-11-11 MED ORDER — PREDNISONE 20 MG PO TABS: 20.0000 mg | ORAL_TABLET | Freq: Two times a day (BID) | ORAL | Status: DC

## 2012-11-11 MED ORDER — METHYLPREDNISOLONE SODIUM SUCC 125 MG IJ SOLR
125.0000 mg | Freq: Once | INTRAMUSCULAR | Status: AC
  Administered 2012-11-11: 125 mg via INTRAVENOUS

## 2012-11-11 MED ORDER — DIPHENHYDRAMINE HCL 50 MG/ML IJ SOLN
INTRAMUSCULAR | Status: AC
  Administered 2012-11-11: 25 mg via INTRAVENOUS
  Filled 2012-11-11: qty 1

## 2012-11-11 MED ORDER — DIPHENHYDRAMINE HCL 50 MG/ML IJ SOLN
25.0000 mg | Freq: Once | INTRAMUSCULAR | Status: AC
  Administered 2012-11-11: 25 mg via INTRAVENOUS

## 2012-11-11 MED ORDER — SODIUM CHLORIDE 0.9 % IV SOLN
INTRAVENOUS | Status: DC
  Administered 2012-11-11: 12:00:00 via INTRAVENOUS

## 2012-11-11 MED ORDER — FAMOTIDINE IN NACL 20-0.9 MG/50ML-% IV SOLN
20.0000 mg | Freq: Once | INTRAVENOUS | Status: AC
  Administered 2012-11-11: 20 mg via INTRAVENOUS
  Filled 2012-11-11: qty 50

## 2012-11-11 NOTE — ED Notes (Signed)
Pt started new BP med, began having swelling to eyes/mouth and rash.  2 benedryl taken prior to arrival.  Pt having some sob.  Awake oriented.  Rash to upper chest/back.

## 2012-11-11 NOTE — ED Provider Notes (Signed)
History     CSN: [id number]  Arrival date & time 11/11/12  1055   First MD Initiated Contact with Patient 11/11/12 1057      Chief Complaint  Patient presents with  . Allergic Reaction  . Shortness of Breath    (Consider location/radiation/quality/duration/timing/severity/associated sxs/prior treatment) HPI Comments: Briana [name] is a 79 y.o. Female who presents for evaluation of swelling face, mouth, and chin, that started today after taking methotrexate. She takes methotrexate once a week, chronically. She denies chest pain, wheezing, shortness of breath, dizziness , cough, nausea, or vomiting. No prior similar symptoms. There are no other known aggravating  Factors. She took Benadryl prior to coming in, and feels somewhat better.  Patient is a 79 y.o. female presenting with allergic reaction and shortness of breath. The history is provided by the patient.  Allergic Reaction The primary symptoms are  shortness of breath.  Shortness of Breath  Associated symptoms include shortness of breath.    Past Medical History  Diagnosis Date  . ACUTE URIS OF UNSPECIFIED SITE 11/26/2008  . ALLERGIC RHINITIS 10/28/2007  . ARTHRITIS, KNEES, BILATERAL 10/28/2007  . BACK PAIN 04/12/2009  . BUNIONECTOMY, HX OF 10/28/2007  . CELLULITIS&ABSCESS OF HAND EXCEPT FINGERS&THUMB 10/23/2010  . HYPERLIPIDEMIA 10/28/2007  . HYPERTENSION 10/28/2007  . INSOMNIA UNSPECIFIED 10/31/2007  . MYOSITIS 10/22/2010  . NECK PAIN 12/03/2008  . Pain in joint, site unspecified 10/07/2010  . WRIST PAIN, LEFT 10/22/2010  . ALLERGIC ARTHRITIS OTHER SPECIFIED SITES 10/22/2010    OA + RA    Past Surgical History  Procedure Date  . Arthroscopic surgery and rotator cuff repair left shoulder   . Arthroscopy right knee hx of surgery   . Hand surgery for broken finger, remote   . Bunionectomy     hx of  . Foot surgery     RIGHT  . Total knee arthroplasty 07/01/2012    Procedure: TOTAL KNEE ARTHROPLASTY;  Surgeon: Thera Flake., MD;  Location: [facility] OR;  Service: Orthopedics;  Laterality: Right;  right total knee arthroplasty    Family History  Problem Relation Age of Onset  . Cancer Brother     lung cancer  . Coronary artery disease Other   . Heart attack Other     History  Substance Use Topics  . Smoking status: Never Smoker   . Smokeless tobacco: Not on file  . Alcohol Use: No    OB History    Grav Para Term Preterm Abortions TAB SAB Ect Mult Living                  Review of Systems  Respiratory: Positive for shortness of breath.   All other systems reviewed and are negative.    Allergies  Diltiazem hcl; Hydrochlorothiazide; Hydrocodone; Lisinopril; and Verapamil  Home Medications   Current Outpatient Rx  Name  Route  Sig  Dispense  Refill  . AMLODIPINE BESYLATE 10 MG PO TABS   Oral   Take 1 tablet (10 mg total) by mouth daily.   90 tablet   2   . ATENOLOL 100 MG PO TABS   Oral   Take 1 tablet (100 mg total) by mouth daily.   90 tablet   2   . VITAMIN D 2000 UNITS PO TABS   Oral   Take 2,000 Units by mouth daily.         Marland Kitchen FEXOFENADINE HCL 180 MG PO TABS   Oral   Take 180  mg by mouth as needed. For allergies         . FOLIC ACID 1 MG PO TABS   Oral   Take 1 mg by mouth daily.         Marland Kitchen HYDROCODONE-ACETAMINOPHEN 5-325 MG PO TABS   Oral   Take 1 tablet by mouth every 6 (six) hours as needed. For pain.         . IBUPROFEN 600 MG PO TABS   Oral   Take 600 mg by mouth 2 (two) times daily as needed. For pain.         Marland Kitchen METHOTREXATE 2.5 MG PO TABS   Oral   Take 20 mg by mouth once a week. Caution:Chemotherapy. Protect from light.  Taken on Fridays.         Marland Kitchen SIMVASTATIN 40 MG PO TABS   Oral   Take 40 mg by mouth every evening.         Marland Kitchen TEMAZEPAM 15 MG PO CAPS   Oral   Take 1 capsule (15 mg total) by mouth at bedtime as needed for sleep.   30 capsule   5   . PREDNISONE 20 MG PO TABS   Oral   Take 1 tablet (20 mg total) by mouth 2 (two) times  daily.   10 tablet   0     BP 161/66  Pulse 68  Temp 98.6 F (37 C) (Oral)  Resp 24  SpO2 100%  Physical Exam  Nursing note and vitals reviewed. Constitutional: She is oriented to person, place, and time. She appears well-developed and well-nourished.  HENT:  Head: Normocephalic and atraumatic.       Intraoral angioedema of the soft palate and uvula. Mild, associated sublingual swelling. The airway is patent, without stridor, but a mildly altered voice tone.   Eyes: Conjunctivae normal and EOM are normal. Pupils are equal, round, and reactive to light.  Neck: Normal range of motion and phonation normal. Neck supple.  Cardiovascular: Normal rate, regular rhythm and intact distal pulses.   Pulmonary/Chest: Effort normal and breath sounds normal. She exhibits no tenderness.  Abdominal: Soft. She exhibits no distension. There is no tenderness. There is no guarding.  Musculoskeletal: Normal range of motion.  Neurological: She is alert and oriented to person, place, and time. She has normal strength. No cranial nerve deficit. She exhibits normal muscle tone. Coordination normal.       No dysarthria, or aphasia  Skin: Skin is warm and dry.  Psychiatric: She has a normal mood and affect. Her behavior is normal. Judgment and thought content normal.    ED Course  Procedures (including critical care time)  Emergency department treatment: IV fluids, IV, Pepcid, IV, Benadryl, IV, Solu-Medrol.   Close observation with serial examinations. Patient did not have progression of her angioedema.  Reevaluated at 14:10- oral angioedema 80% better. No voice abnormality. No sublingual swelling. Lungs clear. No wheezing. Vital signs are stable, improved, mild blood pressure elevation.   CRITICAL CARE Performed by: Flint Melter   Total critical care time: 40 minutes  Critical care time was exclusive of separately billable procedures and treating other patients.  Critical care was necessary  to treat or prevent imminent or life-threatening deterioration.  Critical care was time spent personally by me on the following activities: development of treatment plan with patient and/or surrogate as well as nursing, discussions with consultants, evaluation of patient's response to treatment, examination of patient, obtaining history from patient or surrogate, ordering  and performing treatments and interventions, ordering and review of laboratory studies, ordering and review of radiographic studies, pulse oximetry and re-evaluation of patient's condition.  Labs Reviewed  CBC WITH DIFFERENTIAL - Abnormal; Notable for the following:    RDW 15.7 (*)     All other components within normal limits  BASIC METABOLIC PANEL - Abnormal; Notable for the following:    Potassium 3.2 (*)     Glucose, Bld 217 (*)     GFR calc non Af Amer 57 (*)     GFR calc Af Amer 66 (*)     All other components within normal limits   Dg Chest Port 1 View  11/11/2012  *RADIOLOGY REPORT*  Clinical Data: Shortness of breath secondary to allergic reaction to medication.  PORTABLE CHEST - 1 VIEW  Comparison: 06/27/2012  Findings: Heart size and pulmonary vascularity are normal and the lungs are clear.  No significant osseous abnormality.  IMPRESSION: No acute disease in the chest.   Original Report Authenticated By: Francene Boyers, M.D.      1. Allergic reaction       MDM  Angioedema, likely secondary to allergic reaction. Etiology not clear. Temporally, associated with use of chronic medication, methotrexate. No anaphylaxis. She improved, with symptomatic treatment. She is stable for discharge. Doubt metabolic instability, serious bacterial infection or impending vascular collapse; the patient is stable for discharge.     Plan: Home Medications- Pepcid, Benadryl, prednisone;  Home Treatments- rest; Recommended- follow up with rheumatology in 3 days, as scheduled      Flint Melter, MD 11/11/12 1427

## 2012-12-06 ENCOUNTER — Telehealth: Payer: Self-pay | Admitting: Internal Medicine

## 2012-12-06 MED ORDER — ATENOLOL 100 MG PO TABS: 100.0000 mg | ORAL_TABLET | Freq: Every day | ORAL | Status: DC

## 2012-12-06 MED ORDER — SIMVASTATIN 40 MG PO TABS: 40.0000 mg | ORAL_TABLET | Freq: Every evening | ORAL | Status: DC

## 2012-12-06 MED ORDER — AMLODIPINE BESYLATE 10 MG PO TABS: 10.0000 mg | ORAL_TABLET | Freq: Every day | ORAL | Status: DC

## 2012-12-06 NOTE — Telephone Encounter (Signed)
Pt called to say she got a call from [facility] that her ibuprophen is ready to pick up.  [facility] is her pharmacy and should be where the request came from.   Please call her. She needs 4 meds ordered.   Simvastatin 40, amlobipine 10, atenolol 100, and the ibuprophen 600.

## 2012-12-07 ENCOUNTER — Other Ambulatory Visit: Payer: Self-pay | Admitting: *Deleted

## 2012-12-07 MED ORDER — SIMVASTATIN 40 MG PO TABS: 40.0000 mg | ORAL_TABLET | Freq: Every evening | ORAL | Status: DC

## 2012-12-07 MED ORDER — IBUPROFEN 600 MG PO TABS: 600.0000 mg | ORAL_TABLET | Freq: Two times a day (BID) | ORAL | Status: DC | PRN

## 2012-12-07 MED ORDER — AMLODIPINE BESYLATE 10 MG PO TABS: 10.0000 mg | ORAL_TABLET | Freq: Every day | ORAL | Status: DC

## 2012-12-07 MED ORDER — ATENOLOL 100 MG PO TABS: 100.0000 mg | ORAL_TABLET | Freq: Every day | ORAL | Status: DC

## 2012-12-28 ENCOUNTER — Encounter: Payer: Self-pay | Admitting: Internal Medicine

## 2012-12-28 ENCOUNTER — Ambulatory Visit (INDEPENDENT_AMBULATORY_CARE_PROVIDER_SITE_OTHER): Payer: Medicare PPO | Admitting: Internal Medicine

## 2012-12-28 VITALS — BP 150/74 | HR 56 | Temp 97.7°F | Resp 20 | Ht 64.0 in | Wt 168.0 lb

## 2012-12-28 DIAGNOSIS — M129 Arthropathy, unspecified: Secondary | ICD-10-CM

## 2012-12-28 DIAGNOSIS — J309 Allergic rhinitis, unspecified: Secondary | ICD-10-CM

## 2012-12-28 DIAGNOSIS — I10 Essential (primary) hypertension: Secondary | ICD-10-CM

## 2012-12-28 MED ORDER — HYDROCODONE-ACETAMINOPHEN 5-325 MG PO TABS: 1.0000 | ORAL_TABLET | Freq: Four times a day (QID) | ORAL | Status: DC | PRN

## 2012-12-28 MED ORDER — FLUTICASONE PROPIONATE 50 MCG/ACT NA SUSP: 2.0000 | Freq: Every day | NASAL | Status: DC

## 2012-12-28 NOTE — Assessment & Plan Note (Signed)
Persistent sinus drainage not responsive to oral non-sedating anti-histamine  Plan Trial of Flonase nasal spray

## 2012-12-28 NOTE — Progress Notes (Signed)
  Subjective:    Patient ID: Briana [name], female    DOB: 03-15-1931, 79 y.o.   MRN: [id number]  HPI Briana [name] presents because she was told she needed a physical in order to get medication refills. In revieweing her record she had an annual physical exam in August '13. She also had an exam prior to TKR.   She reports that she did have a reaction to medication, question of MTX. She is followed by Dr. Nickola Major for RA  Immunologic therapy is too expensive and she is considering taking MTX again.  Her only other complaint is sinus drainage that is not relieved by Allegra.   PMH, FamHx and SocHx reviewed for any changes and relevance.  Current Outpatient Prescriptions on File Prior to Visit  Medication Sig Dispense Refill  . amLODipine (NORVASC) 10 MG tablet Take 1 tablet (10 mg total) by mouth daily.  90 tablet  0  . atenolol (TENORMIN) 100 MG tablet Take 1 tablet (100 mg total) by mouth daily.  90 tablet  0  . Cholecalciferol (VITAMIN D) 2000 UNITS tablet Take 2,000 Units by mouth daily.      Marland Kitchen HYDROcodone-acetaminophen (NORCO/VICODIN) 5-325 MG per tablet Take 1 tablet by mouth every 6 (six) hours as needed. For pain.      Marland Kitchen ibuprofen (ADVIL,MOTRIN) 600 MG tablet Take 1 tablet (600 mg total) by mouth 2 (two) times daily as needed. For pain.  120 tablet  0  . predniSONE (DELTASONE) 20 MG tablet Take 1 tablet (20 mg total) by mouth 2 (two) times daily.  10 tablet  0  . simvastatin (ZOCOR) 40 MG tablet Take 1 tablet (40 mg total) by mouth every evening.  90 tablet  0  . temazepam (RESTORIL) 15 MG capsule Take 1 capsule (15 mg total) by mouth at bedtime as needed for sleep.  30 capsule  5  . folic acid (FOLVITE) 1 MG tablet Take 1 mg by mouth daily.      . methotrexate (RHEUMATREX) 2.5 MG tablet Take 20 mg by mouth once a week. Caution:Chemotherapy. Protect from light.  Taken on Fridays.       No current facility-administered medications on file prior to visit.      Review of  Systems System review is negative for any constitutional, cardiac, pulmonary, GI or neuro symptoms or complaints other than as described in the HPI.    Objective:   Physical Exam   Filed Vitals:   12/28/12 0913  BP: 150/74  Pulse: 56  Temp: 97.7 F (36.5 C)  Resp: 20   Gen'l - WNWD AA woman in no distress HEENT- No sinus tenderness to percussion Cor - RRR Pulm - Lungs CTAP Ext - right knee with well healed surgical scar.      Assessment & Plan:

## 2012-12-28 NOTE — Patient Instructions (Addendum)
Sorry about the mix up on need of appointment for refills, but it is good to see you.  When your medications need refill we will do it without an appointment. You are due for your next full exam August '14.  For the runny nose please try Flonase nasal spray every day. It will take 2-5 days to work but is does a very good job with very few side effects. Be sure to gargle and rinse your mouth after use (take spray treatment before you brush your teeth.)  See you in August.

## 2012-12-28 NOTE — Assessment & Plan Note (Signed)
Still having pain in the right knee 6 months after surgery.  Plan - per orthopedist

## 2012-12-28 NOTE — Assessment & Plan Note (Signed)
BP Readings from Last 3 Encounters:  12/28/12 150/74  11/11/12 142/62  07/04/12 144/52   Borderline control on present medication. No change at this time.

## 2013-01-10 ENCOUNTER — Encounter (HOSPITAL_BASED_OUTPATIENT_CLINIC_OR_DEPARTMENT_OTHER): Payer: Self-pay | Admitting: Emergency Medicine

## 2013-01-10 ENCOUNTER — Emergency Department (HOSPITAL_BASED_OUTPATIENT_CLINIC_OR_DEPARTMENT_OTHER)
Admission: EM | Admit: 2013-01-10 | Discharge: 2013-01-10 | Disposition: A | Discharge status: Home / Self Care | Payer: Medicare PPO | Attending: Emergency Medicine | Admitting: Emergency Medicine

## 2013-01-10 DIAGNOSIS — I10 Essential (primary) hypertension: Secondary | ICD-10-CM | POA: Insufficient documentation

## 2013-01-10 DIAGNOSIS — R197 Diarrhea, unspecified: Secondary | ICD-10-CM | POA: Insufficient documentation

## 2013-01-10 DIAGNOSIS — Z8739 Personal history of other diseases of the musculoskeletal system and connective tissue: Secondary | ICD-10-CM | POA: Insufficient documentation

## 2013-01-10 DIAGNOSIS — Z9889 Other specified postprocedural states: Secondary | ICD-10-CM | POA: Insufficient documentation

## 2013-01-10 DIAGNOSIS — Z79899 Other long term (current) drug therapy: Secondary | ICD-10-CM | POA: Insufficient documentation

## 2013-01-10 DIAGNOSIS — R112 Nausea with vomiting, unspecified: Secondary | ICD-10-CM

## 2013-01-10 DIAGNOSIS — G4700 Insomnia, unspecified: Secondary | ICD-10-CM | POA: Insufficient documentation

## 2013-01-10 DIAGNOSIS — E785 Hyperlipidemia, unspecified: Secondary | ICD-10-CM | POA: Insufficient documentation

## 2013-01-10 LAB — URINE MICROSCOPIC-ADD ON

## 2013-01-10 LAB — URINALYSIS, ROUTINE W REFLEX MICROSCOPIC
Bilirubin Urine: NEGATIVE
Ketones, ur: NEGATIVE mg/dL
Nitrite: NEGATIVE
pH: 5.5 (ref 5.0–8.0)

## 2013-01-10 LAB — COMPREHENSIVE METABOLIC PANEL
ALT: 15 U/L (ref 0–35)
Alkaline Phosphatase: 119 U/L — ABNORMAL HIGH (ref 39–117)
CO2: 23 mEq/L (ref 19–32)
Chloride: 103 mEq/L (ref 96–112)
GFR calc Af Amer: >90 mL/min (ref >90)
Glucose, Bld: 177 mg/dL — ABNORMAL HIGH (ref 70–99)
Potassium: 3.7 mEq/L (ref 3.5–5.1)
Sodium: 141 mEq/L (ref 135–145)
Total Bilirubin: 0.3 mg/dL (ref 0.3–1.2)
Total Protein: 8.1 g/dL (ref 6.0–8.3)

## 2013-01-10 LAB — CBC WITH DIFFERENTIAL/PLATELET
Eosinophils Absolute: 0 10*3/uL (ref 0.0–0.7)
Hemoglobin: 13.9 g/dL (ref 12.0–15.0)
Lymphocytes Relative: 3 % — ABNORMAL LOW (ref 12–46)
Lymphs Abs: 0.4 10*3/uL — ABNORMAL LOW (ref 0.7–4.0)
Neutro Abs: 12.5 10*3/uL — ABNORMAL HIGH (ref 1.7–7.7)
Neutrophils Relative %: 94 % — ABNORMAL HIGH (ref 43–77)
Platelets: 224 10*3/uL (ref 150–400)
RBC: 4.44 MIL/uL (ref 3.87–5.11)
WBC: 13.3 10*3/uL — ABNORMAL HIGH (ref 4.0–10.5)

## 2013-01-10 MED ORDER — SODIUM CHLORIDE 0.9 % IV BOLUS (SEPSIS)
1000.0000 mL | Freq: Once | INTRAVENOUS | Status: AC
  Administered 2013-01-10: 1000 mL via INTRAVENOUS

## 2013-01-10 MED ORDER — ONDANSETRON HCL 4 MG/2ML IJ SOLN
4.0000 mg | Freq: Once | INTRAMUSCULAR | Status: AC
Start: 2013-01-10 — End: 2013-01-10
  Administered 2013-01-10: 4 mg via INTRAVENOUS
  Filled 2013-01-10: qty 2

## 2013-01-10 MED ORDER — ONDANSETRON HCL 4 MG/2ML IJ SOLN
4.0000 mg | Freq: Once | INTRAMUSCULAR | Status: AC
  Administered 2013-01-10: 4 mg via INTRAVENOUS
  Filled 2013-01-10: qty 2

## 2013-01-10 MED ORDER — ONDANSETRON 8 MG PO TBDP: 8.0000 mg | ORAL_TABLET | Freq: Three times a day (TID) | ORAL | Status: DC | PRN

## 2013-01-10 MED ORDER — POTASSIUM CHLORIDE CRYS ER 20 MEQ PO TBCR
20.0000 meq | EXTENDED_RELEASE_TABLET | Freq: Once | ORAL | Status: AC
  Administered 2013-01-10: 20 meq via ORAL
  Filled 2013-01-10: qty 1

## 2013-01-10 MED ORDER — SODIUM CHLORIDE 0.9 % IV BOLUS (SEPSIS): 500.0000 mL | Freq: Once | INTRAVENOUS | Status: DC

## 2013-01-10 NOTE — ED Notes (Signed)
Pt provided with ginger-ale at bedside, encouraged to drink as tolerated.

## 2013-01-10 NOTE — ED Notes (Signed)
Pt reports she would "like to wait longer before taking potassium chloride because she wont be able to keep it down."

## 2013-01-10 NOTE — ED Notes (Signed)
Family comes to the triage window and states they are taking patient to [facility] and do not wish to wait.

## 2013-01-10 NOTE — ED Provider Notes (Addendum)
History     CSN: [id number]  Arrival date & time 01/10/13  0039   First MD Initiated Contact with Patient 01/10/13 0105      Chief Complaint  Patient presents with  . Emesis  . Diarrhea    (Consider location/radiation/quality/duration/timing/severity/associated sxs/prior treatment) HPI 79 y.o. Female co nausea, vomiting, and diarrhea began about 5 p.m.  Patient felt ok prior to the onset of vomiting.  Patient vomited food about every thirty minutes until vomiting clear substance.  STool loose, no blood.  No pain.  Patient has felt chills.  Took promethazine at home without relief.  No known sick contacts.  PMD Dr. Arthur Holms Past Medical History  Diagnosis Date  . ACUTE URIS OF UNSPECIFIED SITE 11/26/2008  . ALLERGIC RHINITIS 10/28/2007  . ARTHRITIS, KNEES, BILATERAL 10/28/2007  . BACK PAIN 04/12/2009  . BUNIONECTOMY, HX OF 10/28/2007  . CELLULITIS&ABSCESS OF HAND EXCEPT FINGERS&THUMB 10/23/2010  . HYPERLIPIDEMIA 10/28/2007  . HYPERTENSION 10/28/2007  . INSOMNIA UNSPECIFIED 10/31/2007  . MYOSITIS 10/22/2010  . NECK PAIN 12/03/2008  . Pain in joint, site unspecified 10/07/2010  . WRIST PAIN, LEFT 10/22/2010  . ALLERGIC ARTHRITIS OTHER SPECIFIED SITES 10/22/2010    OA + RA    Past Surgical History  Procedure Laterality Date  . Arthroscopic surgery and rotator cuff repair left shoulder    . Arthroscopy right knee hx of surgery    . Hand surgery for broken finger, remote    . Bunionectomy      hx of  . Foot surgery      RIGHT  . Total knee arthroplasty  07/01/2012    Procedure: TOTAL KNEE ARTHROPLASTY;  Surgeon: Thera Flake., MD;  Location: [facility] OR;  Service: Orthopedics;  Laterality: Right;  right total knee arthroplasty    Family History  Problem Relation Age of Onset  . Cancer Brother     lung cancer  . Coronary artery disease Other   . Heart attack Other     History  Substance Use Topics  . Smoking status: Never Smoker   . Smokeless tobacco: Not on file  . Alcohol Use: No     OB History   Grav Para Term Preterm Abortions TAB SAB Ect Mult Living                  Review of Systems  Constitutional: Positive for chills.  HENT: Negative.   Eyes: Negative.   Respiratory: Negative.   Cardiovascular: Negative.   Gastrointestinal: Positive for nausea, vomiting and diarrhea. Negative for abdominal pain.  Genitourinary: Negative.   All other systems reviewed and are negative.    Allergies  Diltiazem hcl; Hydrochlorothiazide; Lisinopril; Oxycodone; and Verapamil  Home Medications   Current Outpatient Rx  Name  Route  Sig  Dispense  Refill  . amLODipine (NORVASC) 10 MG tablet   Oral   Take 1 tablet (10 mg total) by mouth daily.   90 tablet   0     PT NEEDS TO SCHEDULE PHYSICAL EXAM FOR FURTHER REF ...   . atenolol (TENORMIN) 100 MG tablet   Oral   Take 1 tablet (100 mg total) by mouth daily.   90 tablet   0     PT NEEDS TO SCHEDULE PHYSICAL EXAM FOR FURTHER REF ...   . Cholecalciferol (VITAMIN D) 2000 UNITS tablet   Oral   Take 2,000 Units by mouth daily.         . fluticasone (FLONASE) 50 MCG/ACT nasal spray  Nasal   Place 2 sprays into the nose daily.   16 g   6   . folic acid (FOLVITE) 1 MG tablet   Oral   Take 1 mg by mouth daily.         Marland Kitchen HYDROcodone-acetaminophen (NORCO/VICODIN) 5-325 MG per tablet   Oral   Take 1 tablet by mouth every 6 (six) hours as needed. For pain.   60 tablet   1   . ibuprofen (ADVIL,MOTRIN) 600 MG tablet   Oral   Take 1 tablet (600 mg total) by mouth 2 (two) times daily as needed. For pain.   120 tablet   0   . leflunomide (ARAVA) 20 MG tablet   Oral   Take 20 mg by mouth daily.         . methotrexate (RHEUMATREX) 2.5 MG tablet   Oral   Take 20 mg by mouth once a week. Caution:Chemotherapy. Protect from light.  Taken on Fridays.         . predniSONE (DELTASONE) 20 MG tablet   Oral   Take 1 tablet (20 mg total) by mouth 2 (two) times daily.   10 tablet   0   . simvastatin  (ZOCOR) 40 MG tablet   Oral   Take 1 tablet (40 mg total) by mouth every evening.   90 tablet   0     PT NEEDS TO SCHEDULE PHYSICAL EXAM FOR FURTHER REF ...   . temazepam (RESTORIL) 15 MG capsule   Oral   Take 1 capsule (15 mg total) by mouth at bedtime as needed for sleep.   30 capsule   5     BP 152/63  Pulse 96  Temp(Src) 97.5 F (36.4 C) (Oral)  Resp 18  SpO2 96%  Physical Exam  Nursing note and vitals reviewed. Constitutional: She is oriented to person, place, and time. She appears well-developed and well-nourished.  HENT:  Head: Normocephalic and atraumatic.  Eyes: Conjunctivae and EOM are normal. Pupils are equal, round, and reactive to light.  Neck: Normal range of motion. Neck supple.  Cardiovascular: Normal rate, regular rhythm, normal heart sounds and intact distal pulses.   Pulmonary/Chest: Effort normal and breath sounds normal.  Abdominal: Soft. Bowel sounds are normal.  Musculoskeletal: Normal range of motion.  Neurological: She is alert and oriented to person, place, and time. She has normal reflexes.  Skin: Skin is warm and dry.  Psychiatric: She has a normal mood and affect. Her behavior is normal. Judgment and thought content normal.    ED Course  Procedures (including critical care time)  Labs Reviewed - No data to display No results found.   No diagnosis found.  Filed Vitals:   01/10/13 0045  BP: 152/63  Pulse: 96  Temp: 97.5 F (36.4 C)  Resp: 18    Results for orders placed during the hospital encounter of 01/10/13  CBC WITH DIFFERENTIAL      Result Value Range   WBC 13.3 (*) 4.0 - 10.5 K/uL   RBC 4.44  3.87 - 5.11 MIL/uL   Hemoglobin 13.9  12.0 - 15.0 g/dL   HCT 40.9  81.1 - 91.4 %   MCV 88.5  78.0 - 100.0 fL   MCH 31.3  26.0 - 34.0 pg   MCHC 35.4  30.0 - 36.0 g/dL   RDW 78.2  95.6 - 21.3 %   Platelets 224  150 - 400 K/uL   Neutrophils Relative 94 (*) 43 - 77 %  Neutro Abs 12.5 (*) 1.7 - 7.7 K/uL   Lymphocytes Relative 3  (*) 12 - 46 %   Lymphs Abs 0.4 (*) 0.7 - 4.0 K/uL   Monocytes Relative 3  3 - 12 %   Monocytes Absolute 0.4  0.1 - 1.0 K/uL   Eosinophils Relative 0  0 - 5 %   Eosinophils Absolute 0.0  0.0 - 0.7 K/uL   Basophils Relative 0  0 - 1 %   Basophils Absolute 0.0  0.0 - 0.1 K/uL  COMPREHENSIVE METABOLIC PANEL      Result Value Range   Sodium 141  135 - 145 mEq/L   Potassium 3.7  3.5 - 5.1 mEq/L   Chloride 103  96 - 112 mEq/L   CO2 23  19 - 32 mEq/L   Glucose, Bld 177 (*) 70 - 99 mg/dL   BUN 21  6 - 23 mg/dL   Creatinine, Ser 9.14  0.50 - 1.10 mg/dL   Calcium 9.9  8.4 - 78.2 mg/dL   Total Protein 8.1  6.0 - 8.3 g/dL   Albumin 4.0  3.5 - 5.2 g/dL   AST 23  0 - 37 U/L   ALT 15  0 - 35 U/L   Alkaline Phosphatase 119 (*) 39 - 117 U/L   Total Bilirubin 0.3  0.3 - 1.2 mg/dL   GFR calc non Af Amer 80 (*) >90 mL/min   GFR calc Af Amer >90  >90 mL/min  LIPASE, BLOOD      Result Value Range   Lipase 27  11 - 59 U/L    MDM  Patient with vomiting and diarrhea at home today.  No fever, but some chills.  She has not had any bleeding.  She has tolerated fluids here by mouth and received iv hydration.  She feels improved. She is given rx for zofran and advised to return if symptoms worsen or recheck if not better tomorrow.         Hilario Quarry, MD 01/10/13 9562  Hilario Quarry, MD 02/02/13 [phone]

## 2013-01-10 NOTE — ED Notes (Signed)
Pt tolerating PO fluids at this time.

## 2013-01-10 NOTE — Progress Notes (Signed)
Performed arterial puncture to obtain necessary lab work.

## 2013-01-10 NOTE — ED Notes (Signed)
Patient's IV fluids still infusing at this time.

## 2013-01-10 NOTE — ED Notes (Signed)
Pt c/o vomiting and diarrhea since 5 pm

## 2013-01-11 LAB — URINE CULTURE

## 2013-02-28 ENCOUNTER — Telehealth: Payer: Self-pay | Admitting: Internal Medicine

## 2013-02-28 MED ORDER — SIMVASTATIN 40 MG PO TABS: 40.0000 mg | ORAL_TABLET | Freq: Every evening | ORAL | Status: DC

## 2013-02-28 MED ORDER — ATENOLOL 100 MG PO TABS: 100.0000 mg | ORAL_TABLET | Freq: Every day | ORAL | Status: DC

## 2013-02-28 MED ORDER — AMLODIPINE BESYLATE 10 MG PO TABS: 10.0000 mg | ORAL_TABLET | Freq: Every day | ORAL | Status: DC

## 2013-02-28 NOTE — Telephone Encounter (Signed)
Pt notified this was done

## 2013-02-28 NOTE — Telephone Encounter (Signed)
Patient is requesting refills on her amlodipine, atenolol, and simvastatin to [facility], call patient when this has been completed

## 2013-06-01 ENCOUNTER — Encounter: Payer: Self-pay | Admitting: Internal Medicine

## 2013-06-11 ENCOUNTER — Encounter (HOSPITAL_BASED_OUTPATIENT_CLINIC_OR_DEPARTMENT_OTHER): Payer: Self-pay | Admitting: *Deleted

## 2013-06-11 ENCOUNTER — Emergency Department (HOSPITAL_BASED_OUTPATIENT_CLINIC_OR_DEPARTMENT_OTHER)
Admission: EM | Admit: 2013-06-11 | Discharge: 2013-06-11 | Disposition: A | Discharge status: Home / Self Care | Payer: Medicare PPO | Attending: Emergency Medicine | Admitting: Emergency Medicine

## 2013-06-11 DIAGNOSIS — T7840XA Allergy, unspecified, initial encounter: Secondary | ICD-10-CM

## 2013-06-11 DIAGNOSIS — R220 Localized swelling, mass and lump, head: Secondary | ICD-10-CM | POA: Insufficient documentation

## 2013-06-11 DIAGNOSIS — I10 Essential (primary) hypertension: Secondary | ICD-10-CM | POA: Insufficient documentation

## 2013-06-11 DIAGNOSIS — Z9189 Other specified personal risk factors, not elsewhere classified: Secondary | ICD-10-CM | POA: Insufficient documentation

## 2013-06-11 DIAGNOSIS — E785 Hyperlipidemia, unspecified: Secondary | ICD-10-CM | POA: Insufficient documentation

## 2013-06-11 DIAGNOSIS — Z79899 Other long term (current) drug therapy: Secondary | ICD-10-CM | POA: Insufficient documentation

## 2013-06-11 DIAGNOSIS — Z872 Personal history of diseases of the skin and subcutaneous tissue: Secondary | ICD-10-CM | POA: Insufficient documentation

## 2013-06-11 DIAGNOSIS — R1310 Dysphagia, unspecified: Secondary | ICD-10-CM | POA: Insufficient documentation

## 2013-06-11 DIAGNOSIS — Z8739 Personal history of other diseases of the musculoskeletal system and connective tissue: Secondary | ICD-10-CM | POA: Insufficient documentation

## 2013-06-11 DIAGNOSIS — IMO0002 Reserved for concepts with insufficient information to code with codable children: Secondary | ICD-10-CM | POA: Insufficient documentation

## 2013-06-11 DIAGNOSIS — IMO0001 Reserved for inherently not codable concepts without codable children: Secondary | ICD-10-CM | POA: Insufficient documentation

## 2013-06-11 DIAGNOSIS — M129 Arthropathy, unspecified: Secondary | ICD-10-CM | POA: Insufficient documentation

## 2013-06-11 DIAGNOSIS — T394X5A Adverse effect of antirheumatics, not elsewhere classified, initial encounter: Secondary | ICD-10-CM | POA: Insufficient documentation

## 2013-06-11 DIAGNOSIS — R4701 Aphasia: Secondary | ICD-10-CM | POA: Insufficient documentation

## 2013-06-11 MED ORDER — ETOMIDATE 2 MG/ML IV SOLN
INTRAVENOUS | Status: AC
  Filled 2013-06-11: qty 20

## 2013-06-11 MED ORDER — METHYLPREDNISOLONE SODIUM SUCC 125 MG IJ SOLR
125.0000 mg | Freq: Once | INTRAMUSCULAR | Status: AC
  Administered 2013-06-11: 125 mg via INTRAVENOUS
  Filled 2013-06-11: qty 2

## 2013-06-11 MED ORDER — FAMOTIDINE IN NACL 20-0.9 MG/50ML-% IV SOLN
20.0000 mg | Freq: Once | INTRAVENOUS | Status: AC
  Administered 2013-06-11: 20 mg via INTRAVENOUS
  Filled 2013-06-11: qty 50

## 2013-06-11 MED ORDER — RACEPINEPHRINE HCL 2.25 % IN NEBU
0.5000 mL | INHALATION_SOLUTION | Freq: Once | RESPIRATORY_TRACT | Status: AC
  Administered 2013-06-11: 0.5 mL via RESPIRATORY_TRACT
  Filled 2013-06-11: qty 0.5

## 2013-06-11 MED ORDER — RACEPINEPHRINE HCL 2.25 % IN NEBU: 0.5000 mL | INHALATION_SOLUTION | Freq: Once | RESPIRATORY_TRACT | Status: DC

## 2013-06-11 MED ORDER — PREDNISONE 10 MG PO TABS: ORAL_TABLET | ORAL | Status: DC

## 2013-06-11 MED ORDER — DIPHENHYDRAMINE HCL 50 MG/ML IJ SOLN
25.0000 mg | Freq: Once | INTRAMUSCULAR | Status: AC
  Administered 2013-06-11: 25 mg via INTRAVENOUS
  Filled 2013-06-11: qty 1

## 2013-06-11 MED ORDER — ROCURONIUM BROMIDE 50 MG/5ML IV SOLN
INTRAVENOUS | Status: AC
  Filled 2013-06-11: qty 2

## 2013-06-11 MED ORDER — SUCCINYLCHOLINE CHLORIDE 20 MG/ML IJ SOLN
INTRAMUSCULAR | Status: AC
  Filled 2013-06-11: qty 5

## 2013-06-11 MED ORDER — DIPHENHYDRAMINE HCL 25 MG PO CAPS
25.0000 mg | ORAL_CAPSULE | Freq: Once | ORAL | Status: AC
  Administered 2013-06-11: 25 mg via ORAL
  Filled 2013-06-11: qty 1

## 2013-06-11 MED ORDER — LIDOCAINE HCL (CARDIAC) 20 MG/ML IV SOLN
INTRAVENOUS | Status: AC
  Filled 2013-06-11: qty 5

## 2013-06-11 NOTE — ED Notes (Signed)
EDPA Langston Masker at bedside.

## 2013-06-11 NOTE — ED Notes (Signed)
Patient took ibuprofen about 45 min ago. Now having an allergic reaction to something, but unsure what. Patient states that she can feel her mouth and face swelling. Took some allergy medicine to "slow it down". Patients speech is incoherent at times.

## 2013-06-11 NOTE — ED Notes (Signed)
Pt d/c home with ride- rx x 1 given for prednisone. Pt's son at bedside to drive

## 2013-06-11 NOTE — ED Provider Notes (Addendum)
It is now 1449 pm.  I saw this patient at 1434. Is asked to see her by our PA. She has a dystonic moist. She is not drooling. She is palate edema to his uvula edema she has swelling of the right lip chin and swelling of the right. Orbital soft tissues. She has a very slight stridor.  Recommendation to her in front of our PA in front of person who was immediate intubation for airway protection.  She refuses. She states she has had swelling before" it usually gets better give me some prednisone and send me home".  I told her in no uncertain terms that I felt this was a risk to her life. I told her I felt this was a risk to her airway. In not getting her would be against my medical opinion.  She still declines. Plan. IV steroids. IV antihistamines. We will not use subcutaneous epi at this point. I have asked her nebulized racemic epinephrine. Plan of close observation. We'll continue to update her on her findings on exam.  Claudean Kinds, MD 06/11/13 1452  Is now 1606. Reexamine her. Her palate and uvular edema or improving. Her voice is clear. She is not stridorous. She is not drooling. She cannot fully supine without being apprehensive. Plan is additional observation  Claudean Kinds, MD 06/11/13 [phone]

## 2013-06-11 NOTE — ED Notes (Signed)
MD was notified and evaluated this patient, and educated to the patient that this condition can become life threatening and serious, recommend that her to be intubated. Pt refused to be intubated despite the education given. Move to trauma room for closer observation.

## 2013-06-11 NOTE — ED Notes (Signed)
Slur speech from tongue swelling. Pt denied SOB at this current moment. Good sat on room air, lungs clear. Took 3 pills of benadryl prior to arrival. Doesn't know what caused the onset of this allergic reaction, possibly ibuprofen per patient.

## 2013-06-11 NOTE — ED Provider Notes (Signed)
CSN: [id number]     Arrival date & time 06/11/13  1356 History   First MD Initiated Contact with Patient 06/11/13 1419     Chief Complaint  Patient presents with  . Allergic Reaction   (Consider location/radiation/quality/duration/timing/severity/associated sxs/prior Treatment) Patient is a 79 y.o. female presenting with allergic reaction. The history is provided by the patient. No language interpreter was used.  Allergic Reaction Presenting symptoms: difficulty breathing, difficulty swallowing and swelling   Difficulty breathing:    Severity:  Mild   Onset quality:  Gradual   Duration:  1 hour   Timing:  Constant   Progression:  Partially resolved Difficulty swallowing:    Severity:  Mild   Onset quality:  Gradual   Progression:  Improving Severity:  Severe Prior allergic episodes:  Allergies to medications Context: medications   Relieved by:  Nothing Worsened by:  Nothing tried Ineffective treatments:  Antihistamines Pt reports she took ibuprofen today.  Pt reports after taking her mouth and throat began swelling.   Pt has difficulty speaking due to swelling  Past Medical History  Diagnosis Date  . ACUTE URIS OF UNSPECIFIED SITE 11/26/2008  . ALLERGIC RHINITIS 10/28/2007  . ARTHRITIS, KNEES, BILATERAL 10/28/2007  . BACK PAIN 04/12/2009  . BUNIONECTOMY, HX OF 10/28/2007  . CELLULITIS&ABSCESS OF HAND EXCEPT FINGERS&THUMB 10/23/2010  . HYPERLIPIDEMIA 10/28/2007  . HYPERTENSION 10/28/2007  . INSOMNIA UNSPECIFIED 10/31/2007  . MYOSITIS 10/22/2010  . NECK PAIN 12/03/2008  . Pain in joint, site unspecified 10/07/2010  . WRIST PAIN, LEFT 10/22/2010  . ALLERGIC ARTHRITIS OTHER SPECIFIED SITES 10/22/2010    OA + RA   Past Surgical History  Procedure Laterality Date  . Arthroscopic surgery and rotator cuff repair left shoulder    . Arthroscopy right knee hx of surgery    . Hand surgery for broken finger, remote    . Bunionectomy      hx of  . Foot surgery      RIGHT  . Total knee  arthroplasty  07/01/2012    Procedure: TOTAL KNEE ARTHROPLASTY;  Surgeon: Thera Flake., MD;  Location: [facility] OR;  Service: Orthopedics;  Laterality: Right;  right total knee arthroplasty   Family History  Problem Relation Age of Onset  . Cancer Brother     lung cancer  . Coronary artery disease Other   . Heart attack Other    History  Substance Use Topics  . Smoking status: Never Smoker   . Smokeless tobacco: Not on file  . Alcohol Use: No   OB History   Grav Para Term Preterm Abortions TAB SAB Ect Mult Living                 Review of Systems  HENT: Positive for trouble swallowing.   Respiratory: Positive for shortness of breath.   All other systems reviewed and are negative.    Allergies  Ibuprofen; Diltiazem hcl; Hydrochlorothiazide; Lisinopril; Oxycodone; and Verapamil  Home Medications   Current Outpatient Rx  Name  Route  Sig  Dispense  Refill  . amLODipine (NORVASC) 10 MG tablet   Oral   Take 1 tablet (10 mg total) by mouth daily.   90 tablet   3   . atenolol (TENORMIN) 100 MG tablet   Oral   Take 1 tablet (100 mg total) by mouth daily.   90 tablet   3   . Cholecalciferol (VITAMIN D) 2000 UNITS tablet   Oral   Take 2,000 Units by mouth daily.         Marland Kitchen  fluticasone (FLONASE) 50 MCG/ACT nasal spray   Nasal   Place 2 sprays into the nose daily.   16 g   6   . folic acid (FOLVITE) 1 MG tablet   Oral   Take 1 mg by mouth daily.         Marland Kitchen HYDROcodone-acetaminophen (NORCO/VICODIN) 5-325 MG per tablet   Oral   Take 1 tablet by mouth every 6 (six) hours as needed. For pain.   60 tablet   1   . ibuprofen (ADVIL,MOTRIN) 600 MG tablet   Oral   Take 1 tablet (600 mg total) by mouth 2 (two) times daily as needed. For pain.   120 tablet   0   . leflunomide (ARAVA) 20 MG tablet   Oral   Take 20 mg by mouth daily.         . methotrexate (RHEUMATREX) 2.5 MG tablet   Oral   Take 20 mg by mouth once a week. Caution:Chemotherapy. Protect from  light.  Taken on Fridays.         . ondansetron (ZOFRAN ODT) 8 MG disintegrating tablet   Oral   Take 1 tablet (8 mg total) by mouth every 8 (eight) hours as needed for nausea.   20 tablet   0   . predniSONE (DELTASONE) 10 MG tablet      6,5,4,3,2,1   21 tablet   0   . predniSONE (DELTASONE) 20 MG tablet   Oral   Take 1 tablet (20 mg total) by mouth 2 (two) times daily.   10 tablet   0   . simvastatin (ZOCOR) 40 MG tablet   Oral   Take 1 tablet (40 mg total) by mouth every evening.   90 tablet   3   . temazepam (RESTORIL) 15 MG capsule   Oral   Take 1 capsule (15 mg total) by mouth at bedtime as needed for sleep.   30 capsule   5    BP 167/76  Pulse 81  Temp(Src) 97.4 F (36.3 C) (Oral)  Resp 22  SpO2 99% Physical Exam  Nursing note and vitals reviewed. Constitutional: She is oriented to person, place, and time. She appears well-developed and well-nourished.  HENT:  Head: Normocephalic.  Right Ear: External ear normal.  Left Ear: External ear normal.  swellin posterior palate and uvula,  Swelling right lower lip, speech impaired by mouth swelling  Eyes: Conjunctivae and EOM are normal. Pupils are equal, round, and reactive to light.  Neck: Normal range of motion.  Cardiovascular: Normal rate and normal heart sounds.   Pulmonary/Chest: Effort normal and breath sounds normal.  Abdominal: Soft. Bowel sounds are normal.  Neurological: She is alert and oriented to person, place, and time.  Skin: Skin is warm.  Psychiatric: She has a normal mood and affect.    ED Course  Procedures (including critical care time) Labs Review Labs Reviewed - No data to display Imaging Review No results found.  MDM   1. Allergic reaction caused by a drug    Dr. Fayrene Fearing in to see with me due to oral edema.   Pt counseled on possibility of airway obstruction.   Pt does not want to be intubated.   Pt understands possibility of loss of airway and death. Pt agrees to sign AMA  paper.  Pt wants medications only.   Pt given benadryl, solumedrol, pepcid and racemic epi.     Pt observed x 5 hours.   Swelling completely resolved.  Pt refuses admission.   I counseled pt extensively.   She agrees to return if any further swelling.  Pt given rx for prednisone.  Pt advised to continue benadryl     Elson Areas, PA-C 06/11/13 2025  Lonia Skinner [place], [place] 06/11/13 2026

## 2013-06-12 NOTE — ED Provider Notes (Signed)
Medical screening examination/treatment/procedure(s) were conducted as a shared visit with non-physician practitioner(s) and myself.  I personally evaluated the patient during the encounter  Please see my included note.  Pt examined.  Extensive pharyngeal and laryngeal edema noted.  Symptoms rapid onset and progression per history. P trefuses airway protection.  Claudean Kinds, MD 06/12/13 0700

## 2013-06-13 ENCOUNTER — Telehealth: Payer: Self-pay | Admitting: Internal Medicine

## 2013-06-13 ENCOUNTER — Telehealth: Payer: Self-pay | Admitting: *Deleted

## 2013-06-13 DIAGNOSIS — T783XXD Angioneurotic edema, subsequent encounter: Secondary | ICD-10-CM

## 2013-06-13 NOTE — Telephone Encounter (Signed)
Dr. Debby Bud is wanting pt to see Dr. Maple Hudson ASAP. Pt was treated in the ED 06/11/13. He wants her to see an allergists Dx: allergic angio edema. Please advise Dr. Maple Hudson for new pt consult thanks

## 2013-06-13 NOTE — Telephone Encounter (Signed)
Pt will take 06/19/13 confirmed with mary in primary/cb

## 2013-06-13 NOTE — Telephone Encounter (Signed)
Pt states she is allergic to a medication.  She does not know what medication it is, she is requesting a referral to an Allergist.  Please advise

## 2013-06-13 NOTE — Telephone Encounter (Signed)
Pt advised of MDs message. 

## 2013-06-13 NOTE — Telephone Encounter (Signed)
Patient was seen in ED for angioedema requiring IV steroids and benadryl. He refused intubation to protect her airway and did respond to treatment and was d/c.  She needs an urgent allergy evaluation: Dr Maple Hudson first choice, [facility] second choice.

## 2013-06-13 NOTE — Telephone Encounter (Signed)
LMTCB-can offer Monday 06-19-13 at 11:15am or Thursday 06-22-13 at 11:00am.

## 2013-06-19 ENCOUNTER — Encounter: Payer: Self-pay | Admitting: Internal Medicine

## 2013-06-19 ENCOUNTER — Ambulatory Visit (INDEPENDENT_AMBULATORY_CARE_PROVIDER_SITE_OTHER): Payer: Medicare PPO | Admitting: Internal Medicine

## 2013-06-19 ENCOUNTER — Ambulatory Visit: Payer: Medicare PPO

## 2013-06-19 VITALS — BP 124/60 | HR 65 | Ht 64.5 in | Wt 172.4 lb

## 2013-06-19 DIAGNOSIS — T7840XS Allergy, unspecified, sequela: Secondary | ICD-10-CM

## 2013-06-19 DIAGNOSIS — J309 Allergic rhinitis, unspecified: Secondary | ICD-10-CM

## 2013-06-19 DIAGNOSIS — T7589XS Other specified effects of external causes, sequela: Secondary | ICD-10-CM

## 2013-06-19 DIAGNOSIS — T788XXS Other adverse effects, not elsewhere classified, sequela: Secondary | ICD-10-CM

## 2013-06-19 DIAGNOSIS — Z886 Allergy status to analgesic agent status: Secondary | ICD-10-CM

## 2013-06-19 NOTE — Progress Notes (Signed)
06/19/13- 72 yoF never smoker referred courtesy of Dr Debby Bud; having severe reactions to meds and unknown things. Pt ends up at the ER with throat closing up. Has had more than one occasion after ibuprofen when her throat would swell and she would go to ER for steroids and antihistamines. Ibuprofen remains her habitual med for mild arthritis pain. Doesn't remember when she has used ASA or Tylenol.  Notes post nasal drip and admits hx seasonal hayfever. Never skin tested. No hx asthma, or unusual rash or reaction to insects, foods, contact. Denies family allergy hx.  Prior to Admission medications   Medication Sig Start Date End Date Taking? Authorizing Provider  amLODipine (NORVASC) 10 MG tablet Take 1 tablet (10 mg total) by mouth daily. 02/28/13  Yes Jacques Navy, MD  atenolol (TENORMIN) 100 MG tablet Take 1 tablet (100 mg total) by mouth daily. 02/28/13  Yes Jacques Navy, MD  Cholecalciferol (VITAMIN D) 2000 UNITS tablet Take 2,000 Units by mouth daily.   Yes Historical Provider, MD  fexofenadine (ALLEGRA) 180 MG tablet Take 180 mg by mouth daily.   Yes Historical Provider, MD  folic acid (FOLVITE) 1 MG tablet Take 1 mg by mouth daily.   Yes Historical Provider, MD  HYDROcodone-acetaminophen (NORCO/VICODIN) 5-325 MG per tablet Take 1 tablet by mouth every 6 (six) hours as needed. For pain. 12/28/12  Yes Jacques Navy, MD  methotrexate (RHEUMATREX) 2.5 MG tablet Take 20 mg by mouth once a week. Caution:Chemotherapy. Protect from light.  Taken on Fridays.   Yes Historical Provider, MD  simvastatin (ZOCOR) 40 MG tablet Take 1 tablet (40 mg total) by mouth every evening. 02/28/13  Yes Jacques Navy, MD  temazepam (RESTORIL) 15 MG capsule Take 1 capsule (15 mg total) by mouth at bedtime as needed for sleep. 12/22/11  Yes Jacques Navy, MD   Earl Lagos /psh/ Family History  Problem Relation Age of Onset  . Cancer Brother     lung cancer  . Coronary artery disease Other   . Heart attack Other     History   Social History  . Marital Status: Widowed    Spouse Name: N/A    Number of Children: 4  . Years of Education: 12   Occupational History  . 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 Smoking status: Never Smoker   . Smokeless tobacco: Not on file  . Alcohol Use: No  . Drug Use: No  . Sexual Activity: Not on file   Other Topics Concern  . 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   ROS-see HPI Constitutional:   No-   weight loss, night sweats, fevers, chills, fatigue, lassitude. HEENT:   No-  headaches, difficulty swallowing, tooth/dental problems, sore throat,       No-  sneezing, itching, ear ache, nasal congestion, post nasal drip,  CV:  No-   chest pain, orthopnea, PND, swelling in lower extremities, anasarca,  dizziness, palpitations Resp: No-   shortness of breath with exertion or at rest.              No-   productive cough,  No non-productive cough,  No- coughing up of blood.  No-   change in color of mucus.  No- wheezing.   Skin: No-   rash or lesions. GI:  No-   heartburn, indigestion, abdominal pain, nausea, vomiting, diarrhea,                 change in bowel habits, loss of appetite GU: No-   dysuria, change in color of urine, no urgency or frequency.  No- flank pain. MS:  + joint pain or swelling.  No- decreased range of motion.  No- back pain. Neuro-     nothing unusual Psych:  No- change in mood or affect. No depression or anxiety.  No memory loss.  OBJ- Physical Exam General- Alert, Oriented, Affect-appropriate, Distress- none acute, elderly Skin- rash-none, lesions- none, excoriation- none Lymphadenopathy- none Head- atraumatic            Eyes- Gross vision intact, PERRLA, conjunctivae and secretions clear            Ears- Hearing,  canals-normal            Nose- Clear, no-Septal dev, mucus, polyps, erosion, perforation             Throat- Mallampati II , mucosa clear , drainage- none, tonsils- atrophic, dentures Neck- flexible , trachea midline, no stridor , thyroid nl, carotid no bruit Chest - symmetrical excursion , unlabored           Heart/CV- RRR , no murmur , no gallop  , no rub, nl s1 s2                           - JVD- none , edema- none, stasis changes- none, varices- none           Lung- clear to P&A, wheeze- none, cough- none , dullness-none, rub- none           Chest wall-  Abd- tender-no, distended-no, bowel sounds-present, HSM- no Br/ Gen/ Rectal- Not done, not indicated Extrem- cyanosis- none, clubbing, none, atrophy- none, strength- nl Neuro- grossly intact to observation

## 2013-06-19 NOTE — Patient Instructions (Addendum)
Order- lab- Allergy Profile, Food IgE profile, Aspirin IgE 87210, Ibuprofen IgE [phone]       Dx Allergic reaction  Suggest instead of ibuprofen, you try using Tylenol for pain if needed

## 2013-06-20 LAB — ALLERGEN FOOD PROFILE SPECIFIC IGE
Apple: <0.1 kU/L
Egg White IgE: <0.1 kU/L
Milk IgE: <0.1 kU/L
Orange: <0.1 kU/L
Shrimp IgE: <0.1 kU/L
Soybean IgE: <0.1 kU/L
Tomato IgE: <0.1 kU/L
Tuna IgE: <0.1 kU/L

## 2013-06-20 LAB — ALLERGY FULL PROFILE
Allergen,Goose feathers, e70: <0.1 kU/L
Alternaria Alternata: <0.1 kU/L
Aspergillus fumigatus, m3: <0.1 kU/L
Bermuda Grass: <0.1 kU/L
Candida Albicans: <0.1 kU/L
Cat Dander: <0.1 kU/L
Common Ragweed: <0.1 kU/L
D. farinae: 11.2 kU/L — ABNORMAL HIGH
Dog Dander: <0.1 kU/L
Lamb's Quarters: <0.1 kU/L
Plantain: <0.1 kU/L
Stemphylium Botryosum: <0.1 kU/L

## 2013-06-21 LAB — SPECIMEN STATUS REPORT

## 2013-06-25 DIAGNOSIS — Z886 Allergy status to analgesic agent status: Secondary | ICD-10-CM | POA: Insufficient documentation

## 2013-06-25 NOTE — Assessment & Plan Note (Signed)
Not a current complaint

## 2013-06-25 NOTE — Assessment & Plan Note (Signed)
Plan-educated to use Tylenol. Lab for allergy profile, food IgE, IgE for aspirin and ibuprofen

## 2013-07-10 ENCOUNTER — Telehealth: Payer: Self-pay | Admitting: Internal Medicine

## 2013-07-10 NOTE — Telephone Encounter (Signed)
Spoke with patient, patient requesting lab results from 06/19/13 Dr. Maple Hudson please advise, thank you!  06/19/13: Patient Instructions    Order- lab- Allergy Profile, Food IgE profile, Aspirin IgE 87210, Ibuprofen IgE 09811 Dx Allergic reaction  Suggest instead of ibuprofen, you try using Tylenol for pain if needed

## 2013-07-13 NOTE — Telephone Encounter (Signed)
Results have been printed off with phone note. Please advise Dr. Maple Hudson thanks

## 2013-07-14 NOTE — Telephone Encounter (Signed)
Not all the results came back from [facility]. I have called and confirmed that the Ibuprofen IgE was not resulted as they did not get req form from our lab. After speaking with Aram Beecham is lab she will make sure that pt will not be charged for re stick on Monday. Pt is aware and we will await results from [facility] to give all results to patient at one time. Pt is aware and very understanding.

## 2013-07-24 ENCOUNTER — Encounter: Payer: Self-pay | Admitting: Internal Medicine

## 2013-07-24 ENCOUNTER — Ambulatory Visit (INDEPENDENT_AMBULATORY_CARE_PROVIDER_SITE_OTHER): Payer: Medicare PPO | Admitting: Internal Medicine

## 2013-07-24 ENCOUNTER — Other Ambulatory Visit (INDEPENDENT_AMBULATORY_CARE_PROVIDER_SITE_OTHER): Payer: Medicare PPO

## 2013-07-24 VITALS — BP 130/80 | HR 55 | Temp 98.0°F | Ht 64.0 in | Wt 171.6 lb

## 2013-07-24 DIAGNOSIS — Z0000 Encounter for general adult medical examination without abnormal findings: Secondary | ICD-10-CM

## 2013-07-24 DIAGNOSIS — R7309 Other abnormal glucose: Secondary | ICD-10-CM

## 2013-07-24 DIAGNOSIS — G4700 Insomnia, unspecified: Secondary | ICD-10-CM

## 2013-07-24 DIAGNOSIS — R739 Hyperglycemia, unspecified: Secondary | ICD-10-CM

## 2013-07-24 DIAGNOSIS — Z23 Encounter for immunization: Secondary | ICD-10-CM

## 2013-07-24 DIAGNOSIS — J302 Other seasonal allergic rhinitis: Secondary | ICD-10-CM

## 2013-07-24 DIAGNOSIS — E785 Hyperlipidemia, unspecified: Secondary | ICD-10-CM

## 2013-07-24 DIAGNOSIS — J309 Allergic rhinitis, unspecified: Secondary | ICD-10-CM

## 2013-07-24 DIAGNOSIS — M129 Arthropathy, unspecified: Secondary | ICD-10-CM

## 2013-07-24 DIAGNOSIS — I10 Essential (primary) hypertension: Secondary | ICD-10-CM

## 2013-07-24 LAB — LIPID PANEL
HDL: 129.2 mg/dL (ref >39.00)
Triglycerides: 63 mg/dL (ref 0.0–149.0)
VLDL: 12.6 mg/dL (ref 0.0–40.0)

## 2013-07-24 LAB — HEMOGLOBIN A1C: Hgb A1c MFr Bld: 6.4 % (ref 4.6–6.5)

## 2013-07-24 MED ORDER — ATENOLOL 100 MG PO TABS: 100.0000 mg | ORAL_TABLET | Freq: Every day | ORAL | Status: DC

## 2013-07-24 MED ORDER — AMLODIPINE BESYLATE 10 MG PO TABS: 10.0000 mg | ORAL_TABLET | Freq: Every day | ORAL | Status: DC

## 2013-07-24 MED ORDER — SIMVASTATIN 40 MG PO TABS: 40.0000 mg | ORAL_TABLET | Freq: Every evening | ORAL | Status: DC

## 2013-07-24 NOTE — Assessment & Plan Note (Signed)
Interval hx notable for reaction to ibuprofen but otherwise doing OK. Physical exam was ok. Labs - lipid, BMet ok. She is current with colorectal and breast cancer screening. Immunizations up to date except for shingles.  In summary A nice woman who is doing ok.

## 2013-07-24 NOTE — Assessment & Plan Note (Signed)
Poor sleep habit.  Plan Will mail handout on sleep hygiene  Continue with prn Temazepam

## 2013-07-24 NOTE — Assessment & Plan Note (Signed)
Allergy testing results reviewed with patient: allergic to ASA, some molds  Plan Avoid NSAIDs  Continue oral medications

## 2013-07-24 NOTE — Assessment & Plan Note (Signed)
S/p right TKR but still having some pain. Having progressive pain left knee but wary of TKR given less than totally satisfactory results with right TKR  Plan APAP for pain  F/u with ortho

## 2013-07-24 NOTE — Assessment & Plan Note (Signed)
BP Readings from Last 3 Encounters:  07/24/13 130/80  06/19/13 124/60  06/11/13 167/76   Lab- normal Bmet  Plan Continue present regimen

## 2013-07-24 NOTE — Patient Instructions (Signed)
  Good to see you.   Your allergy testing was positive for allergy to Aspirin.  Your exam is ok. Labs ordered today include cholesterol and sugar. You  Had labs in April for liver and kidney that were normal.  You will get flu shot today. Please come back in 3 weeks for a nurse visit to have the new pneumonia vaccine - Prevnar 13.  Overall you are in pretty good condition for the condition your are in.

## 2013-07-24 NOTE — Progress Notes (Signed)
Subjective:    Patient ID: Briana [name], female    DOB: [birth year], 79 y.o.   MRN: [id number]  HPI The patient is here for annual Medicare wellness examination and management of other chronic and acute problems.  Interval notable for Ibuprofen allergy with angioedema. She continues to have knee trouble: s/p TKR right - still with soreness; left knee with some trouble. She gets some relief with APAP. She was allergy tested - positive against ASA.   The risk factors are reflected in the social history.  The roster of all physicians providing medical care to patient - is listed in the Snapshot section of the chart.  Activities of daily living:  The patient is 100% inedpendent in all ADLs: dressing, toileting, feeding as well as independent mobility  Home safety : The patient has smoke detectors in the home. Falls - none. They wear seatbelts.  firearms are present in the home, kept in a safe fashion. There is no violence in the home.   There is no risks for hepatitis, STDs or HIV. There is no history of blood transfusion. They have no travel history to infectious disease endemic areas of the world.  The patient has not seen their dentist in the last six month - she is edentulous. They have  seen their eye doctor in the last year - following for cataracts/ floater. They deny any hearing difficulty and have not had audiologic testing in the last year.    They do not  have excessive sun exposure. Discussed the need for sun protection: hats, long sleeves and use of sunscreen if there is significant sun exposure.   Diet: the importance of a healthy diet is discussed. They do have a healthy diet.  The patient has a regular exercise program: water aerobics  , 45 min duration, 2 per week.  The benefits of regular aerobic exercise were discussed.  Depression screen: there are no signs or vegative symptoms of depression- irritability, change in appetite, anhedonia, sadness/tearfullness.  Cognitive  assessment: the patient manages all their financial and personal affairs and is actively engaged.   The following portions of the patient's history were reviewed and updated as appropriate: allergies, current medications, past family history, past medical history,  past surgical history, past social history  and problem list.  Vision, hearing, body mass index were assessed and reviewed.   During the course of the visit the patient was educated and counseled about appropriate screening and preventive services including : fall prevention , diabetes screening, nutrition counseling, colorectal cancer screening, and recommended immunizations.  \ Past Medical History  Diagnosis Date  . ACUTE URIS OF UNSPECIFIED SITE 11/26/2008  . ALLERGIC RHINITIS 10/28/2007  . ARTHRITIS, KNEES, BILATERAL 10/28/2007  . BACK PAIN 04/12/2009  . BUNIONECTOMY, HX OF 10/28/2007  . CELLULITIS&ABSCESS OF HAND EXCEPT FINGERS&THUMB 10/23/2010  . HYPERLIPIDEMIA 10/28/2007  . HYPERTENSION 10/28/2007  . INSOMNIA UNSPECIFIED 10/31/2007  . MYOSITIS 10/22/2010  . NECK PAIN 12/03/2008  . Pain in joint, site unspecified 10/07/2010  . WRIST PAIN, LEFT 10/22/2010  . ALLERGIC ARTHRITIS OTHER SPECIFIED SITES 10/22/2010    OA + RA   Past Surgical History  Procedure Laterality Date  . Arthroscopic surgery and rotator cuff repair left shoulder    . Arthroscopy right knee hx of surgery    . Hand surgery for broken finger, remote    . Bunionectomy      hx of  . Foot surgery      RIGHT  . Total knee  arthroplasty  07/01/2012    Procedure: TOTAL KNEE ARTHROPLASTY;  Surgeon: Thera Flake., MD;  Location: [facility] OR;  Service: Orthopedics;  Laterality: Right;  right total knee arthroplasty   Family History  Problem Relation Age of Onset  . Cancer Brother     lung cancer  . Coronary artery disease Other   . Heart attack Other    History   Social History  . Marital Status: Widowed    Spouse Name: N/A    Number of Children: 4  . Years of  Education: 12   Occupational History  . 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 Smoking status: Never Smoker   . Smokeless tobacco: Not on file  . Alcohol Use: No  . Drug Use: No  . Sexual Activity: Not on file   Other Topics Concern  . 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    Current Outpatient Prescriptions on File Prior to Visit  Medication Sig Dispense Refill  . amLODipine (NORVASC) 10 MG tablet Take 1 tablet (10 mg total) by mouth daily.  90 tablet  3  . atenolol (TENORMIN) 100 MG tablet Take 1 tablet (100 mg total) by mouth daily.  90 tablet  3  . Cholecalciferol (VITAMIN D) 2000 UNITS tablet Take 2,000 Units by mouth daily.      . fexofenadine (ALLEGRA) 180 MG tablet Take 180 mg by mouth daily.      . folic acid (FOLVITE) 1 MG tablet Take 1 mg by mouth daily.      Marland Kitchen HYDROcodone-acetaminophen (NORCO/VICODIN) 5-325 MG per tablet Take 1 tablet by mouth every 6 (six) hours as needed. For pain.  60 tablet  1  . methotrexate (RHEUMATREX) 2.5 MG tablet Take 20 mg by mouth once a week. Caution:Chemotherapy. Protect from light.  Taken on Fridays.      . simvastatin (ZOCOR) 40 MG tablet Take 1 tablet (40 mg total) by mouth every evening.  90 tablet  3  . temazepam (RESTORIL) 15 MG capsule Take 1 capsule (15 mg total) by mouth at bedtime as needed for sleep.  30 capsule  5   No current facility-administered medications on file prior to visit.      Review of Systems Constitutional:  Negative for fever, chills, activity change and unexpected weight change.  HEENT:  Negative for hearing loss, ear pain, congestion, neck stiffness and postnasal drip. Negative for sore throat or swallowing problems. Negative for dental complaints.   Eyes: Negative for vision loss or change in visual acuity.   Respiratory: Negative for chest tightness and wheezing. Negative for DOE.   Cardiovascular: Negative for chest pain or palpitations. No decreased exercise tolerance Gastrointestinal: No change in bowel habit. No bloating or gas. No reflux or indigestion Genitourinary: Negative for urgency, frequency, flank pain and difficulty urinating. Nocturia x 3 Musculoskeletal: Negative for myalgias, back pain, and gait problem. Hip pain left due to short-leg syndrome. Knee pain: post-TKR right, left pain. Neurological: Negative for dizziness, tremors, weakness and headaches.  Hematological: Negative for adenopathy.  Psychiatric/Behavioral: Negative for behavioral problems and dysphoric mood.       Objective:   Physical Exam Filed Vitals:   07/24/13 0855  BP: 130/80  Pulse: 55  Temp: 98 F (36.7 C)   Wt Readings from Last 3 Encounters:  07/24/13 171 lb 9.6 oz (77.837 kg)  06/19/13 172 lb 6.4 oz (78.2 kg)  12/28/12 168 lb (76.204 kg)   Gen'l: well nourished, well developed Woman in no distress HEENT - [place]/AT, EACs/TMs normal,w/ some cerumen w/ obstruction oropharynx. Edentulous., no buccal , posterior pharynx clear, mucous membranes moist. C&S clear, PERRLA, fundi - deferred exam to Ophthal Neck - supple, no thyromegaly Nodes- negative submental, cervical, supraclavicular regions Chest - no deformity, no CVAT Lungs - clear without rales, wheezes. No increased work of breathing Breast - deferred to mammogram Aug '14 Cardiovascular - regular rate and rhythm, quiet precordium, no murmurs, rubs or gallops, 2+ radial, DP and PT pulses Abdomen - BS+ x 4, no HSM, no guarding or rebound or tenderness Pelvic - deferred  Rectal - deferred  Extremities - no clubbing, cyanosis, edema or deformity. Left knee enlarged. Right knee with TKR scar. Neuro - A&O x 3, CN II-XII normal, motor strength normal and equal, DTRs 2+ at biceps and radial. Trace at  patellar tendons. Cerebellar - no tremor, no rigidity,  fluid movement and normal gait. Derm - Head, neck, back, abdomen and extremities without suspicious lesions  Recent Results (from the past 2160 hour(s))  ALLERGY FULL PROFILE     Status: Abnormal   Collection Time    06/19/13 12:00 PM      Result Value Range   Allergen, D pternoyssinus,d7 11.00 (*)    D. farinae 11.20 (*)    Cat Dander <0.10     Dog Dander <0.10     Goose Feathers <0.10     [place] Grass <0.10     Fescue <0.10     G005 Rye, Perennial <0.10     Timothy Grass <0.10     G009 Red Top <0.10     [place] Grass <0.10     House Dust Holl[place] 0.84 (*)    Aspergillus fumigatus, m3 <0.10     Alternaria Alternata <0.10     Helminthosporium halodes <0.10     Stemphylium Botryosum <0.10     Candida Albicans <0.10     Curvularia lunata <0.10     Box Elder IgE <0.10     Oak <0.10     Elm IgE <0.10     Sycamore Tree <0.10     Common Ragweed <0.10     Plantain <0.10     Lamb's Quarters <0.10     Goldenrod <0.10     Comment:            ----------------------------------------------------          Class   Specific IgE (kU/L)   Level          ----------------------------------------------------          0       <0.10                 Absent or undetectable          0/1     0.10  -   0.34        Equivocal/Borderline          1       0.35  -   0.69        Low          2       0.70  -   3.49        Moderate  3       3.50  -  17.49        High          4       17.50 -  49.99        Very High          5       50.00 - 100.00        Very High          6       >100.00               Very High  ALLERGEN FOOD PROFILE SPECIFIC IGE     Status: None   Collection Time    06/19/13 12:00 PM      Result Value Range   Egg White IgE <0.10     Milk IgE <0.10     Fish Cod <0.10     Wheat IgE <0.10     Peanut IgE <0.10     Soybean IgE <0.10     Corn <0.10     Tomato IgE <0.10     Orange <0.10     Apple <0.10     Chicken IgE <0.10     Shrimp IgE <0.10     Tuna IgE <0.10      IgE (Immunoglobulin E), Serum 62.4  0.0 - 180.0 IU/mL   Comment:            ----------------------------------------------------          Class   Specific IgE (kU/L)   Level          ----------------------------------------------------          0       <0.10                 Absent or undetectable          0/1     0.10  -   0.34        Equivocal/Borderline          1       0.35  -   0.69        Low          2       0.70  -   3.49        Moderate          3       3.50  -  17.49        High          4       17.50 -  49.99        Very High          5       50.00 - 100.00        Very High          6       >100.00               Very High  ALLERGEN ASPIRIN/SALICYLIC ACID IGE     Status: None   Collection Time    06/19/13 12:00 PM      Result Value Range   Aspirin REPORT     Comment:      Test  Class    Adj. Counts          ----                              -----    -----------     Z610 Aspirin (A.S.A.)  . . . . . . .    Neg         138               RAST Scoring System     Class    Adjusted Counts   Interpretation      Neg              < 501     Negative.      0/I          501 - 750     Equivocal.      I            751 - 1600    Positive with      II          1601 - 3600     increasing amounts      III         3601 - 8000     of specific IgE      IV          8001 - 18000    antibody.      V          18001 - 40000      VI               > 40000                ImmunoCAP Specific IgE Scoring System     Conc (kUa/l)       Class   Interpretation      <0.35             0       Negative.     0.35-0.69          1       Low Positive.     0.70-3.49          2       MidPositive.     3.50-17.49         3       High Positive.     17.50-49.99        4       Very High Positive.     50.00-99.99        5       Very High Positive.      >=100.00          6       Very High Positive.             Specific IgG Scoring System     Class      Concentration (ug/ml)        Interpretation     Neg             <1.00                   Negative.     I           1.00-3.00                   Positive with  II          3.01-10.00                    increasing     III        10.01-30.00                    IgG antibody     IV              >30.00                    concentration.     Ref: [id number]     Test performed by                [facility]                [address]                [place], [place] [address]                Phone:  [phone]  SPECIMEN STATUS REPORT     Status: None   Collection Time    06/19/13 12:00 PM      Result Value Range   specimen status report Comment     Comment: Ambiguous Test Order     Ambiguous Test Order                        SPECIMEN STATUS REPORT     Status: None            Result                                                                             LIPID PANEL     Status: None   Collection Time    07/24/13  9:38 AM      Result Value Range   Cholesterol 199  0 - 200 mg/dL   Comment: ATP III Classification       Desirable:  < 200 mg/dL               Borderline High:  200 - 239 mg/dL          High:  > = 027 mg/dL   Triglycerides 25.3  0.0 - 149.0 mg/dL   Comment: Normal:  <664 mg/dLBorderline High:  150 - 199 mg/dL   HDL 403.47  >42.59 mg/dL   VLDL 56.3  0.0 - 87.5 mg/dL   LDL Cholesterol 57  0 - 99 mg/dL   Total CHOL/HDL Ratio 2     Comment:                Men          Women1/2 Average Risk     3.4          3.3Average Risk          5.0          4.42X Average Risk          9.6          7.13X Average Risk  15.0          11.0                      HEMOGLOBIN A1C     Status: None   Collection Time    07/24/13  9:38 AM      Result Value Range   Hemoglobin A1C 6.4  4.6 - 6.5 %   Comment: Glycemic Control Guidelines for People with Diabetes:Non Diabetic:  <6%Goal of Therapy: <7%Additional Action Suggested:  >8%          Assessment & Plan:

## 2013-07-24 NOTE — Assessment & Plan Note (Signed)
Taking and tolerating medication. Lab reveals LDL 57, HDL 89- LDL/HDL ratio .5  Excellent control on present medications

## 2013-07-31 ENCOUNTER — Ambulatory Visit: Payer: Medicare PPO | Admitting: Internal Medicine

## 2013-07-31 ENCOUNTER — Encounter: Payer: Self-pay | Admitting: Internal Medicine

## 2013-08-15 ENCOUNTER — Telehealth: Payer: Self-pay | Admitting: Internal Medicine

## 2013-08-15 NOTE — Telephone Encounter (Signed)
Spoke with patient; aware that Briana [name] does not have any results on her and I have spoke with Location manager) to help find out what has happened. Pt came back on Monday 07-17-13 to have labs drawn again. Aram Beecham will update me in the morning.   Pt aware I will keep her updated as able.

## 2013-08-15 NOTE — Telephone Encounter (Signed)
I spoke with pt. She is requesting her allergy profile results and ibuprofen "lab" results from October. Please advise Dr. Maple Hudson thanks

## 2013-08-17 NOTE — Telephone Encounter (Signed)
I spoke with patient about results and she verbalized understanding and had no questions 

## 2013-08-17 NOTE — Telephone Encounter (Signed)
Unable to perform Ibuprofen IgE but will forward to CY to advise on all other test results.

## 2013-08-17 NOTE — Telephone Encounter (Signed)
Allergy profile - elevated allergy antibodies to house dust and dust mite. Keeping dust down and putting allergy encasing on mattress and pillow may help. We can talk more about this if needed.  Food allergy panel was negative for evidence of allergy to common food groups. Test for allergy antibody against aspirin was negative The lab could not test for allergy to Ibuprofen. This took the lab a long time to report out, which is why it was delayed getting report to patient.

## 2013-08-18 ENCOUNTER — Other Ambulatory Visit: Payer: Commercial Managed Care - HMO

## 2013-10-19 ENCOUNTER — Telehealth: Payer: Self-pay

## 2013-10-19 DIAGNOSIS — M129 Arthropathy, unspecified: Secondary | ICD-10-CM

## 2013-10-19 NOTE — Telephone Encounter (Signed)
The patient is in need of a referral to her rheumatologist (Dr.Hawks @ [facility])   Pt callback - [phone]

## 2013-10-19 NOTE — Telephone Encounter (Signed)
Order in to [facility]

## 2014-01-16 ENCOUNTER — Other Ambulatory Visit: Payer: Self-pay | Admitting: *Deleted

## 2014-01-16 MED ORDER — ATENOLOL 100 MG PO TABS: 100.0000 mg | ORAL_TABLET | Freq: Every day | ORAL | Status: DC

## 2014-01-16 MED ORDER — AMLODIPINE BESYLATE 10 MG PO TABS: 10.0000 mg | ORAL_TABLET | Freq: Every day | ORAL | Status: DC

## 2014-01-16 MED ORDER — SIMVASTATIN 40 MG PO TABS: 40.0000 mg | ORAL_TABLET | Freq: Every evening | ORAL | Status: DC

## 2014-02-15 ENCOUNTER — Telehealth: Payer: Self-pay | Admitting: Internal Medicine

## 2014-02-15 NOTE — Telephone Encounter (Signed)
Referral for Dr Angela Hawkes faxed to [facility] @ [phone] °

## 2014-04-04 ENCOUNTER — Telehealth: Payer: Self-pay | Admitting: Internal Medicine

## 2014-04-04 NOTE — Telephone Encounter (Signed)
Pt thought she was signed up to transfer from Dr. Linda Hedges to Dr. Ronnald Ramp.  She isn't on the list, but changed her [employer] card to Dr. Ronnald Ramp.  Will this be ok?  She needs to be seen for knee problems.

## 2014-04-06 NOTE — Telephone Encounter (Signed)
Pt called to check up on this request. Please advise.

## 2014-04-07 NOTE — Telephone Encounter (Signed)
Ok with me 

## 2014-04-09 NOTE — Telephone Encounter (Signed)
Pt is aware and made an appt for July 9th.

## 2014-04-19 ENCOUNTER — Ambulatory Visit (INDEPENDENT_AMBULATORY_CARE_PROVIDER_SITE_OTHER): Payer: Medicare PPO | Admitting: Internal Medicine

## 2014-04-19 ENCOUNTER — Other Ambulatory Visit (INDEPENDENT_AMBULATORY_CARE_PROVIDER_SITE_OTHER): Payer: Medicare PPO

## 2014-04-19 ENCOUNTER — Encounter: Payer: Self-pay | Admitting: Internal Medicine

## 2014-04-19 VITALS — BP 130/68 | HR 60 | Temp 97.6°F | Resp 16 | Ht 64.0 in | Wt 181.0 lb

## 2014-04-19 DIAGNOSIS — M170 Bilateral primary osteoarthritis of knee: Secondary | ICD-10-CM

## 2014-04-19 DIAGNOSIS — I10 Essential (primary) hypertension: Secondary | ICD-10-CM

## 2014-04-19 DIAGNOSIS — E785 Hyperlipidemia, unspecified: Secondary | ICD-10-CM

## 2014-04-19 DIAGNOSIS — M1710 Unilateral primary osteoarthritis, unspecified knee: Secondary | ICD-10-CM

## 2014-04-19 DIAGNOSIS — R7309 Other abnormal glucose: Secondary | ICD-10-CM

## 2014-04-19 DIAGNOSIS — G4700 Insomnia, unspecified: Secondary | ICD-10-CM

## 2014-04-19 DIAGNOSIS — Z23 Encounter for immunization: Secondary | ICD-10-CM

## 2014-04-19 LAB — COMPREHENSIVE METABOLIC PANEL
ALT: 16 U/L (ref 0–35)
AST: 21 U/L (ref 0–37)
Albumin: 4.1 g/dL (ref 3.5–5.2)
Alkaline Phosphatase: 93 U/L (ref 39–117)
BUN: 17 mg/dL (ref 6–23)
CALCIUM: 9.6 mg/dL (ref 8.4–10.5)
CHLORIDE: 103 meq/L (ref 96–112)
CO2: 26 meq/L (ref 19–32)
Creatinine, Ser: 0.7 mg/dL (ref 0.4–1.2)
GFR: 98.49 mL/min (ref >60.00)
Glucose, Bld: 113 mg/dL — ABNORMAL HIGH (ref 70–99)
Potassium: 3.7 mEq/L (ref 3.5–5.1)
SODIUM: 137 meq/L (ref 135–145)
TOTAL PROTEIN: 7.6 g/dL (ref 6.0–8.3)
Total Bilirubin: 0.5 mg/dL (ref 0.2–1.2)

## 2014-04-19 LAB — LIPID PANEL
Cholesterol: 184 mg/dL (ref 0–200)
HDL: 109.4 mg/dL (ref >39.00)
LDL Cholesterol: 38 mg/dL (ref 0–99)
NONHDL: 74.6
Total CHOL/HDL Ratio: 2
Triglycerides: 181 mg/dL — ABNORMAL HIGH (ref 0.0–149.0)
VLDL: 36.2 mg/dL (ref 0.0–40.0)

## 2014-04-19 LAB — CBC WITH DIFFERENTIAL/PLATELET
BASOS ABS: 0 10*3/uL (ref 0.0–0.1)
Basophils Relative: 0.6 % (ref 0.0–3.0)
Eosinophils Absolute: 0.2 10*3/uL (ref 0.0–0.7)
Eosinophils Relative: 2 % (ref 0.0–5.0)
HCT: 36.5 % (ref 36.0–46.0)
Hemoglobin: 12.3 g/dL (ref 12.0–15.0)
LYMPHS PCT: 19.6 % (ref 12.0–46.0)
Lymphs Abs: 1.6 10*3/uL (ref 0.7–4.0)
MCHC: 33.7 g/dL (ref 30.0–36.0)
MCV: 95.9 fl (ref 78.0–100.0)
MONOS PCT: 7.4 % (ref 3.0–12.0)
Monocytes Absolute: 0.6 10*3/uL (ref 0.1–1.0)
NEUTROS PCT: 70.4 % (ref 43.0–77.0)
Neutro Abs: 5.6 10*3/uL (ref 1.4–7.7)
PLATELETS: 231 10*3/uL (ref 150.0–400.0)
RBC: 3.81 Mil/uL — ABNORMAL LOW (ref 3.87–5.11)
RDW: 15.1 % (ref 11.5–15.5)
WBC: 8 10*3/uL (ref 4.0–10.5)

## 2014-04-19 LAB — HEMOGLOBIN A1C: HEMOGLOBIN A1C: 6.1 % (ref 4.6–6.5)

## 2014-04-19 LAB — CK: CK TOTAL: 155 U/L (ref 7–177)

## 2014-04-19 LAB — TSH: TSH: 1.28 u[IU]/mL (ref 0.35–4.50)

## 2014-04-19 LAB — HM DEXA SCAN

## 2014-04-19 MED ORDER — TEMAZEPAM 15 MG PO CAPS: 15.0000 mg | ORAL_CAPSULE | Freq: Every evening | ORAL | Status: DC | PRN

## 2014-04-19 MED ORDER — FLAVOCOXID-CIT ZN BISGLCINATE 500-50 MG PO CAPS: 1.0000 | ORAL_CAPSULE | Freq: Two times a day (BID) | ORAL | Status: DC

## 2014-04-19 MED ORDER — ATENOLOL 100 MG PO TABS: 100.0000 mg | ORAL_TABLET | Freq: Every day | ORAL | Status: DC

## 2014-04-19 MED ORDER — SIMVASTATIN 40 MG PO TABS: 40.0000 mg | ORAL_TABLET | Freq: Every evening | ORAL | Status: DC

## 2014-04-19 MED ORDER — AMLODIPINE BESYLATE 10 MG PO TABS: 10.0000 mg | ORAL_TABLET | Freq: Every day | ORAL | Status: DC

## 2014-04-19 NOTE — Assessment & Plan Note (Signed)
She can't take nsaids so I have asked her to try limbrel She wants to see ortho (Dr. Percell Miller) as well

## 2014-04-19 NOTE — Assessment & Plan Note (Signed)
I will check her A1C to see if she has developed DM2 

## 2014-04-19 NOTE — Assessment & Plan Note (Signed)
Her BP is well controlled I will monitor her lytes and renal function 

## 2014-04-19 NOTE — Assessment & Plan Note (Signed)
She will cont the BZD as needed

## 2014-04-19 NOTE — Patient Instructions (Signed)
Osteoarthritis Osteoarthritis is a disease that causes soreness and swelling (inflammation) of a joint. It occurs when the cartilage at the affected joint wears down. Cartilage acts as a cushion, covering the ends of bones where they meet to form a joint. Osteoarthritis is the most common form of arthritis. It often occurs in older people. The joints affected most often by this condition include those in the:  Ends of the fingers.  Thumbs.  Neck.  Lower back.  Knees.  Hips. CAUSES  Over time, the cartilage that covers the ends of bones begins to wear away. This causes bone to rub on bone, producing pain and stiffness in the affected joints.  RISK FACTORS Certain factors can increase your chances of having osteoarthritis, including:  Older age.  Excessive body weight.  Overuse of joints. SIGNS AND SYMPTOMS   Pain, swelling, and stiffness in the joint.  Over time, the joint may lose its normal shape.  Small deposits of bone (osteophytes) may grow on the edges of the joint.  Bits of bone or cartilage can break off and float inside the joint space. This may cause more pain and damage. DIAGNOSIS  Your health care provider will do a physical exam and ask about your symptoms. Various tests may be ordered, such as:  X-rays of the affected joint.  An MRI scan.  Blood tests to rule out other types of arthritis.  Joint fluid tests. This involves using a needle to draw fluid from the joint and examining the fluid under a microscope. TREATMENT  Goals of treatment are to control pain and improve joint function. Treatment plans may include:  A prescribed exercise program that allows for rest and joint relief.  A weight control plan.  Pain relief techniques, such as:  Properly applied heat and cold.  Electric pulses delivered to nerve endings under the skin (transcutaneous electrical nerve stimulation, TENS).  Massage.  Certain nutritional supplements.  Medicines to  control pain, such as:  Acetaminophen.  Nonsteroidal anti-inflammatory drugs (NSAIDs), such as naproxen.  Narcotic or central-acting agents, such as tramadol.  Corticosteroids. These can be given orally or as an injection.  Surgery to reposition the bones and relieve pain (osteotomy) or to remove loose pieces of bone and cartilage. Joint replacement may be needed in advanced states of osteoarthritis. HOME CARE INSTRUCTIONS   Only take over-the-counter or prescription medicines as directed by your health care provider. Take all medicines exactly as instructed.  Maintain a healthy weight. Follow your health care provider's instructions for weight control. This may include dietary instructions.  Exercise as directed. Your health care provider can recommend specific types of exercise. These may include:  Strengthening exercises--These are done to strengthen the muscles that support joints affected by arthritis. They can be performed with weights or with exercise bands to add resistance.  Aerobic activities--These are exercises, such as brisk walking or low-impact aerobics, that get your heart pumping.  Range-of-motion activities--These keep your joints limber.  Balance and agility exercises--These help you maintain daily living skills.  Rest your affected joints as directed by your health care provider.  Follow up with your health care provider as directed. SEEK MEDICAL CARE IF:   Your skin turns red.  You develop a rash in addition to your joint pain.  You have worsening joint pain. SEEK IMMEDIATE MEDICAL CARE IF:  You have a significant loss of weight or appetite.  You have a fever along with joint or muscle aches.  You have night sweats. FOR MORE   INFORMATION  National Institute of Arthritis and Musculoskeletal and Skin Diseases: www.niams.nih.gov National Institute on Aging: www.nia.nih.gov American College of Rheumatology: www.rheumatology.org Document Released:  09/28/2005 Document Revised: 07/19/2013 Document Reviewed: 06/05/2013 ExitCare Patient Information 2015 [employer]. This information is not intended to replace advice given to you by your health care provider. Make sure you discuss any questions you have with your health care provider.  

## 2014-04-19 NOTE — Assessment & Plan Note (Signed)
She is doing well on zocor I will check her CK today to screen for myositis Will also check her FLP today

## 2014-04-19 NOTE — Progress Notes (Signed)
Pre visit review using our clinic review tool, if applicable. No additional management support is needed unless otherwise documented below in the visit note. 

## 2014-04-19 NOTE — Progress Notes (Signed)
   Subjective:    Patient ID: Briana [name], female    DOB: 05-Apr-1931, 80 y.o.   MRN: [id number]  Hypertension This is a chronic problem. The current episode started more than 1 year ago. The problem has been gradually improving since onset. The problem is controlled. Pertinent negatives include no anxiety, blurred vision, chest pain, headaches, malaise/fatigue, neck pain, orthopnea, palpitations, peripheral edema, PND, shortness of breath or sweats. Past treatments include beta blockers and calcium channel blockers. The current treatment provides moderate improvement. Compliance problems include exercise and diet.       Review of Systems  Constitutional: Negative.  Negative for fever, chills, malaise/fatigue, diaphoresis, appetite change and fatigue.  HENT: Negative.   Eyes: Negative.  Negative for blurred vision.  Respiratory: Negative.  Negative for cough, choking, chest tightness, shortness of breath and stridor.   Cardiovascular: Negative.  Negative for chest pain, palpitations, orthopnea, leg swelling and PND.  Gastrointestinal: Negative.  Negative for nausea, vomiting, abdominal pain, diarrhea, constipation and blood in stool.  Endocrine: Negative.  Negative for polydipsia, polyphagia and polyuria.  Genitourinary: Negative.   Musculoskeletal: Positive for arthralgias (bilateral knee pain). Negative for back pain, gait problem, joint swelling, myalgias, neck pain and neck stiffness.  Skin: Negative.  Negative for pallor.  Allergic/Immunologic: Negative.   Neurological: Negative.  Negative for headaches.  Hematological: Negative.  Negative for adenopathy. Does not bruise/bleed easily.  Psychiatric/Behavioral: Positive for sleep disturbance. Negative for suicidal ideas, hallucinations, behavioral problems, confusion, self-injury, dysphoric mood, decreased concentration and agitation. The patient is not nervous/anxious and is not hyperactive.        Objective:   Physical Exam    Vitals reviewed. Constitutional: She is oriented to person, place, and time. She appears well-developed and well-nourished. No distress.  HENT:  Head: Normocephalic and atraumatic.  Mouth/Throat: Oropharynx is clear and moist. No oropharyngeal exudate.  Eyes: Conjunctivae are normal. Right eye exhibits no discharge. Left eye exhibits no discharge. No scleral icterus.  Neck: Normal range of motion. Neck supple. No JVD present. No tracheal deviation present. No thyromegaly present.  Cardiovascular: Normal rate, regular rhythm, normal heart sounds and intact distal pulses.  Exam reveals no gallop and no friction rub.   No murmur heard. Pulmonary/Chest: Effort normal and breath sounds normal. No stridor. No respiratory distress. She has no wheezes. She has no rales. She exhibits no tenderness.  Abdominal: Soft. Bowel sounds are normal. She exhibits no distension and no mass. There is no tenderness. There is no rebound and no guarding.  Musculoskeletal: Normal range of motion. She exhibits no edema and no tenderness.       Right knee: She exhibits deformity (DJD). She exhibits no swelling, no effusion, no ecchymosis, no laceration, no erythema, normal alignment, no LCL laxity, normal patellar mobility and no bony tenderness. No tenderness found.       Left knee: She exhibits deformity (DJD). She exhibits normal range of motion, no swelling, no effusion, no ecchymosis, no laceration, no erythema, normal alignment, no LCL laxity, normal patellar mobility and no bony tenderness. No tenderness found.  Lymphadenopathy:    She has no cervical adenopathy.  Neurological: She is oriented to person, place, and time.  Skin: Skin is warm and dry. No rash noted. She is not diaphoretic. No erythema. No pallor.          Assessment & Plan:

## 2014-04-25 ENCOUNTER — Telehealth: Payer: Self-pay | Admitting: Internal Medicine

## 2014-04-25 NOTE — Telephone Encounter (Signed)
Patient needs a referral to Dr. Maretta Los - ph [phone]  Patient has appt with Dr. Maretta Los on Friday 04/27/2014 at 9:15am

## 2014-04-27 ENCOUNTER — Telehealth: Payer: Self-pay | Admitting: Internal Medicine

## 2014-04-27 NOTE — Telephone Encounter (Signed)
All labs are normal except for elev TG and glucose. F/u w/dr Langley Adie

## 2014-04-27 NOTE — Telephone Encounter (Signed)
Pt request lab result that was done on 04/19/14. Please advise.

## 2014-04-30 NOTE — Telephone Encounter (Signed)
LMOVM advising per MD. 

## 2014-05-03 ENCOUNTER — Telehealth: Payer: Self-pay | Admitting: *Deleted

## 2014-05-03 NOTE — Telephone Encounter (Signed)
Left smg on triage stating some left vm stating that her labs was ok, but needing to f/u with md on her cholesterol requesting call bck. Called pt back verify msg on lab results. Inform pt per md he would like to see her for f/u on her cholesterol. Pt stated dhe will be having knee surgery on 06/13/14 will call back and schedule appt in sept after her surgery. In the meantime advise pt to make sure she is taking her simvastatin...Briana [name]

## 2014-05-17 LAB — HM MAMMOGRAPHY: HM MAMMO: NORMAL

## 2014-05-21 ENCOUNTER — Other Ambulatory Visit: Payer: Self-pay | Admitting: Physician Assistant

## 2014-05-21 NOTE — H&P (Signed)
TOTAL KNEE ADMISSION H&P  Patient is being admitted for left total knee arthroplasty.  Subjective:  Chief Complaint:left knee pain.  HPI: Briana [name], 80 y.o. female, has a history of pain and functional disability in the left knee due to trauma and has failed non-surgical conservative treatments for greater than 12 weeks to includeNSAID's and/or analgesics, corticosteriod injections, viscosupplementation injections and activity modification.  Onset of symptoms was gradual, starting 6 years ago with gradually worsening course since that time. The patient noted no past surgery on the left knee(s).  Patient currently rates pain in the left knee(s) at 9 out of 10 with activity. Patient has night pain, worsening of pain with activity and weight bearing, pain that interferes with activities of daily living, crepitus and joint swelling.  Patient has evidence of subchondral sclerosis and joint space narrowing by imaging studies. There is no active infection.  Patient Active Problem List   Diagnosis Date Noted  . Other abnormal glucose 04/19/2014  . Routine health maintenance 05/30/2012  . INSOMNIA UNSPECIFIED 10/31/2007  . Hyperlipidemia with target LDL less than 130 10/28/2007  . HYPERTENSION 10/28/2007  . Seasonal allergic rhinitis 10/28/2007  . DJD (degenerative joint disease) of knee 10/28/2007   Past Medical History  Diagnosis Date  . ACUTE URIS OF UNSPECIFIED SITE 11/26/2008  . ALLERGIC RHINITIS 10/28/2007  . ARTHRITIS, KNEES, BILATERAL 10/28/2007  . BACK PAIN 04/12/2009  . BUNIONECTOMY, HX OF 10/28/2007  . CELLULITIS&ABSCESS OF HAND EXCEPT FINGERS&THUMB 10/23/2010  . HYPERLIPIDEMIA 10/28/2007  . HYPERTENSION 10/28/2007  . INSOMNIA UNSPECIFIED 10/31/2007  . MYOSITIS 10/22/2010  . NECK PAIN 12/03/2008  . Pain in joint, site unspecified 10/07/2010  . WRIST PAIN, LEFT 10/22/2010  . ALLERGIC ARTHRITIS OTHER SPECIFIED SITES 10/22/2010    OA + RA    Past Surgical History  Procedure Laterality  Date  . Arthroscopic surgery and rotator cuff repair left shoulder    . Arthroscopy right knee hx of surgery    . Hand surgery for broken finger, remote    . Bunionectomy      hx of  . Foot surgery      RIGHT  . Total knee arthroplasty  07/01/2012    Procedure: TOTAL KNEE ARTHROPLASTY;  Surgeon: Yvette Rack., MD;  Location: [place];  Service: Orthopedics;  Laterality: Right;  right total knee arthroplasty     (Not in a hospital admission) Allergies  Allergen Reactions  . Ibuprofen Anaphylaxis  . Diltiazem Hcl     REACTION: ANGIO EDEMA  . Hydrochlorothiazide     REACTION: SWELLING  . Lisinopril     REACTION: SWELLING  . Oxycodone Nausea And Vomiting  . Verapamil     REACTION: SWELLING    History  Substance Use Topics  . Smoking status: Never Smoker   . Smokeless tobacco: Never Used  . Alcohol Use: No    Family History  Problem Relation Age of Onset  . Cancer Brother     lung cancer  . Coronary artery disease Other   . Heart attack Other      Review of Systems  Constitutional: Negative.   HENT: Negative.   Eyes: Negative.   Respiratory: Negative.   Cardiovascular: Negative.   Gastrointestinal: Negative.   Genitourinary: Negative.   Musculoskeletal: Positive for joint pain.  Skin: Negative.   Neurological: Negative.   Endo/Heme/Allergies: Negative.   Psychiatric/Behavioral: Negative.     Objective:  Physical Exam  Constitutional: She is oriented to person, place, and time. She appears  well-developed and well-nourished.  HENT:  Head: Normocephalic and atraumatic.  Eyes: EOM are normal. Pupils are equal, round, and reactive to light.  Neck: Normal range of motion. Neck supple. No thyromegaly present.  Cardiovascular: Normal rate, regular rhythm and normal heart sounds.  Exam reveals no gallop and no friction rub.   No murmur heard. Respiratory: Effort normal and breath sounds normal. No respiratory distress. She has no wheezes.  GI: Soft. Bowel sounds are  normal. She exhibits no distension.  Musculoskeletal:  Examination of her bilateral knees reveals valgus thrust.  Range of motion is 0 to 125 degrees. Negative log roll.  Positive straight leg raise bilaterally.  She does have some tenderness to palpation on the lateral joint line of the left knee.  Ligaments are stable.  Minimal patellofemoral crepitus noted.  She is neurovascularly intact distally.     Neurological: She is alert and oriented to person, place, and time.  Skin: Skin is warm and dry.  Psychiatric: She has a normal mood and affect. Her behavior is normal. Judgment and thought content normal.    Vital signs in last 24 hours: @VSRANGES @  Labs:   Estimated body mass index is 31.05 kg/(m^2) as calculated from the following:   Height as of 04/19/14: 5\' 4"  (1.626 m).   Weight as of 04/19/14: 82.101 kg (181 lb).   Imaging Review Plain radiographs demonstrate severe degenerative joint disease of the left knee(s). The overall alignment ismild varus. The bone quality appears to be fair for age and reported activity level.  Assessment/Plan:  End stage arthritis, left knee   The patient history, physical examination, clinical judgment of the provider and imaging studies are consistent with end stage degenerative joint disease of the left knee(s) and total knee arthroplasty is deemed medically necessary. The treatment options including medical management, injection therapy arthroscopy and arthroplasty were discussed at length. The risks and benefits of total knee arthroplasty were presented and reviewed. The risks due to aseptic loosening, infection, stiffness, patella tracking problems, thromboembolic complications and other imponderables were discussed. The patient acknowledged the explanation, agreed to proceed with the plan and consent was signed. Patient is being admitted for inpatient treatment for surgery, pain control, PT, OT, prophylactic antibiotics, VTE prophylaxis, progressive  ambulation and ADL's and discharge planning. The patient is planning to be discharged to skilled nursing facility

## 2014-06-05 ENCOUNTER — Encounter (HOSPITAL_COMMUNITY)
Admission: RE | Admit: 2014-06-05 | Discharge: 2014-06-05 | Disposition: A | Discharge status: Home / Self Care | Payer: Medicare PPO | Source: Ambulatory Visit | Attending: Orthopedic Surgery | Admitting: Orthopedic Surgery

## 2014-06-05 ENCOUNTER — Encounter (HOSPITAL_COMMUNITY): Payer: Self-pay

## 2014-06-05 ENCOUNTER — Encounter (HOSPITAL_COMMUNITY)
Admission: RE | Admit: 2014-06-05 | Discharge: 2014-06-05 | Disposition: A | Discharge status: Home / Self Care | Payer: Medicare PPO | Source: Ambulatory Visit | Attending: Physician Assistant | Admitting: Physician Assistant

## 2014-06-05 DIAGNOSIS — Z01818 Encounter for other preprocedural examination: Secondary | ICD-10-CM | POA: Diagnosis present

## 2014-06-05 DIAGNOSIS — M1710 Unilateral primary osteoarthritis, unspecified knee: Secondary | ICD-10-CM | POA: Insufficient documentation

## 2014-06-05 HISTORY — DX: Other complications of anesthesia, initial encounter: T88.59XA

## 2014-06-05 HISTORY — DX: Adverse effect of unspecified anesthetic, initial encounter: T41.45XA

## 2014-06-05 LAB — CBC WITH DIFFERENTIAL/PLATELET
BASOS ABS: 0 10*3/uL (ref 0.0–0.1)
Basophils Relative: 0 % (ref 0–1)
EOS PCT: 1 % (ref 0–5)
Eosinophils Absolute: 0.1 10*3/uL (ref 0.0–0.7)
HCT: 37.5 % (ref 36.0–46.0)
Hemoglobin: 12.8 g/dL (ref 12.0–15.0)
LYMPHS PCT: 20 % (ref 12–46)
Lymphs Abs: 1.9 10*3/uL (ref 0.7–4.0)
MCH: 32.3 pg (ref 26.0–34.0)
MCHC: 34.1 g/dL (ref 30.0–36.0)
MCV: 94.7 fL (ref 78.0–100.0)
Monocytes Absolute: 0.5 10*3/uL (ref 0.1–1.0)
Monocytes Relative: 5 % (ref 3–12)
NEUTROS PCT: 74 % (ref 43–77)
Neutro Abs: 7.2 10*3/uL (ref 1.7–7.7)
PLATELETS: 214 10*3/uL (ref 150–400)
RBC: 3.96 MIL/uL (ref 3.87–5.11)
RDW: 15.1 % (ref 11.5–15.5)
WBC: 9.7 10*3/uL (ref 4.0–10.5)

## 2014-06-05 LAB — URINALYSIS, ROUTINE W REFLEX MICROSCOPIC
Bilirubin Urine: NEGATIVE
Glucose, UA: NEGATIVE mg/dL
Hgb urine dipstick: NEGATIVE
KETONES UR: NEGATIVE mg/dL
NITRITE: NEGATIVE
PROTEIN: NEGATIVE mg/dL
Specific Gravity, Urine: 1.023 (ref 1.005–1.030)
Urobilinogen, UA: 0.2 mg/dL (ref 0.0–1.0)
pH: 6 (ref 5.0–8.0)

## 2014-06-05 LAB — URINE MICROSCOPIC-ADD ON

## 2014-06-05 LAB — COMPREHENSIVE METABOLIC PANEL
ALK PHOS: 104 U/L (ref 39–117)
ALT: 20 U/L (ref 0–35)
AST: 20 U/L (ref 0–37)
Albumin: 3.9 g/dL (ref 3.5–5.2)
Anion gap: 14 (ref 5–15)
BUN: 17 mg/dL (ref 6–23)
CALCIUM: 9.4 mg/dL (ref 8.4–10.5)
CO2: 24 mEq/L (ref 19–32)
Chloride: 103 mEq/L (ref 96–112)
Creatinine, Ser: 0.71 mg/dL (ref 0.50–1.10)
GFR calc Af Amer: >90 mL/min (ref >90)
GFR calc non Af Amer: 79 mL/min — ABNORMAL LOW (ref >90)
Glucose, Bld: 115 mg/dL — ABNORMAL HIGH (ref 70–99)
POTASSIUM: 4.1 meq/L (ref 3.7–5.3)
Sodium: 141 mEq/L (ref 137–147)
TOTAL PROTEIN: 7.5 g/dL (ref 6.0–8.3)
Total Bilirubin: 0.5 mg/dL (ref 0.3–1.2)

## 2014-06-05 LAB — SURGICAL PCR SCREEN
MRSA, PCR: NEGATIVE
STAPHYLOCOCCUS AUREUS: NEGATIVE

## 2014-06-05 LAB — TYPE AND SCREEN
ABO/RH(D): O POS
Antibody Screen: NEGATIVE

## 2014-06-05 LAB — PROTIME-INR
INR: 0.92 (ref 0.00–1.49)
Prothrombin Time: 12.4 seconds (ref 11.6–15.2)

## 2014-06-05 LAB — APTT: aPTT: 27 seconds (ref 24–37)

## 2014-06-05 NOTE — Pre-Procedure Instructions (Signed)
Briana [name]  06/05/2014   Your procedure is scheduled on:  06-13-2014   Wednesday   Report to [facility] Admitting at 6:30  AM.  Call this number if you have problems the morning of surgery: [phone]   Remember:   Do not eat food or drink liquids after midnight  Take these medicines the morning of surgery with A SIP OF WATER:amlodipine(Norvasc).fexofenadine(allegra),     Do not wear jewelry, make-up or nail polish.  Do not wear lotions, powders, or perfumes. You may not wear deodorant.  Do not shave 48 hours prior to surgery. Men may shave face and neck.  Do not bring valuables to the hospital.  [facility] is not responsible for any belongings or valuables.               Contacts, dentures or bridgework may not be worn into surgery .  Leave suitcase in the car. After surgery it may be brought to your room.  For patients admitted to the hospital, discharge time is determined by your treatment team.                   Special Instructions: See attached sheet for instructions on CHG shower/bath   Please read over the following fact sheets that you were given: Pain Booklet, Coughing and Deep Breathing, Blood Transfusion Information and Surgical Site Infection Prevention

## 2014-06-06 LAB — URINE CULTURE: Colony Count: 45000

## 2014-06-12 MED ORDER — CEFAZOLIN SODIUM-DEXTROSE 2-3 GM-% IV SOLR
2.0000 g | INTRAVENOUS | Status: AC
  Administered 2014-06-13: 2 g via INTRAVENOUS
  Filled 2014-06-12: qty 50

## 2014-06-13 ENCOUNTER — Inpatient Hospital Stay (HOSPITAL_COMMUNITY): Payer: Medicare PPO | Admitting: Anesthesiology

## 2014-06-13 ENCOUNTER — Encounter (HOSPITAL_COMMUNITY): Payer: Medicare PPO | Admitting: Anesthesiology

## 2014-06-13 ENCOUNTER — Encounter (HOSPITAL_COMMUNITY)
Admission: RE | Disposition: A | Discharge status: Skilled Nursing Facility | Payer: Self-pay | Source: Ambulatory Visit | Attending: Orthopedic Surgery

## 2014-06-13 ENCOUNTER — Inpatient Hospital Stay (HOSPITAL_COMMUNITY)
Admission: RE | Admit: 2014-06-13 | Discharge: 2014-06-14 | DRG: 470 | Disposition: A | Discharge status: Skilled Nursing Facility | Payer: Medicare PPO | Source: Ambulatory Visit | Attending: Orthopedic Surgery | Admitting: Orthopedic Surgery

## 2014-06-13 ENCOUNTER — Encounter (HOSPITAL_COMMUNITY): Payer: Self-pay | Admitting: *Deleted

## 2014-06-13 ENCOUNTER — Inpatient Hospital Stay (HOSPITAL_COMMUNITY): Payer: Medicare PPO

## 2014-06-13 DIAGNOSIS — M25569 Pain in unspecified knee: Secondary | ICD-10-CM | POA: Diagnosis present

## 2014-06-13 DIAGNOSIS — Z79899 Other long term (current) drug therapy: Secondary | ICD-10-CM

## 2014-06-13 DIAGNOSIS — M179 Osteoarthritis of knee, unspecified: Secondary | ICD-10-CM | POA: Diagnosis present

## 2014-06-13 DIAGNOSIS — D62 Acute posthemorrhagic anemia: Secondary | ICD-10-CM | POA: Diagnosis not present

## 2014-06-13 DIAGNOSIS — Z7901 Long term (current) use of anticoagulants: Secondary | ICD-10-CM | POA: Diagnosis not present

## 2014-06-13 DIAGNOSIS — G4700 Insomnia, unspecified: Secondary | ICD-10-CM | POA: Diagnosis present

## 2014-06-13 DIAGNOSIS — Z96659 Presence of unspecified artificial knee joint: Secondary | ICD-10-CM | POA: Diagnosis not present

## 2014-06-13 DIAGNOSIS — Z886 Allergy status to analgesic agent status: Secondary | ICD-10-CM

## 2014-06-13 DIAGNOSIS — I10 Essential (primary) hypertension: Secondary | ICD-10-CM | POA: Diagnosis present

## 2014-06-13 DIAGNOSIS — Z888 Allergy status to other drugs, medicaments and biological substances status: Secondary | ICD-10-CM | POA: Diagnosis not present

## 2014-06-13 DIAGNOSIS — E785 Hyperlipidemia, unspecified: Secondary | ICD-10-CM | POA: Diagnosis present

## 2014-06-13 DIAGNOSIS — R112 Nausea with vomiting, unspecified: Secondary | ICD-10-CM | POA: Diagnosis not present

## 2014-06-13 DIAGNOSIS — M1710 Unilateral primary osteoarthritis, unspecified knee: Secondary | ICD-10-CM | POA: Diagnosis present

## 2014-06-13 DIAGNOSIS — M1712 Unilateral primary osteoarthritis, left knee: Secondary | ICD-10-CM

## 2014-06-13 DIAGNOSIS — Z8249 Family history of ischemic heart disease and other diseases of the circulatory system: Secondary | ICD-10-CM

## 2014-06-13 HISTORY — PX: TOTAL KNEE ARTHROPLASTY: SHX125

## 2014-06-13 HISTORY — DX: Family history of other specified conditions: Z84.89

## 2014-06-13 LAB — CBC
HEMATOCRIT: 33.6 % — AB (ref 36.0–46.0)
Hemoglobin: 11.5 g/dL — ABNORMAL LOW (ref 12.0–15.0)
MCH: 31.9 pg (ref 26.0–34.0)
MCHC: 34.2 g/dL (ref 30.0–36.0)
MCV: 93.3 fL (ref 78.0–100.0)
Platelets: 182 10*3/uL (ref 150–400)
RBC: 3.6 MIL/uL — ABNORMAL LOW (ref 3.87–5.11)
RDW: 14.9 % (ref 11.5–15.5)
WBC: 12.3 10*3/uL — ABNORMAL HIGH (ref 4.0–10.5)

## 2014-06-13 LAB — CREATININE, SERUM
Creatinine, Ser: 0.76 mg/dL (ref 0.50–1.10)
GFR calc Af Amer: 90 mL/min — ABNORMAL LOW (ref >90)
GFR calc non Af Amer: 78 mL/min — ABNORMAL LOW (ref >90)

## 2014-06-13 SURGERY — ARTHROPLASTY, KNEE, TOTAL
Anesthesia: General | Laterality: Left

## 2014-06-13 MED ORDER — METOCLOPRAMIDE HCL 5 MG/ML IJ SOLN
5.0000 mg | Freq: Three times a day (TID) | INTRAMUSCULAR | Status: DC | PRN
Start: 2014-06-13 — End: 2014-06-14
  Administered 2014-06-13: 10 mg via INTRAVENOUS

## 2014-06-13 MED ORDER — ENOXAPARIN SODIUM 30 MG/0.3ML ~~LOC~~ SOLN
30.0000 mg | Freq: Two times a day (BID) | SUBCUTANEOUS | Status: DC
  Administered 2014-06-14: 30 mg via SUBCUTANEOUS
  Filled 2014-06-13 (×3): qty 0.3

## 2014-06-13 MED ORDER — ACETAMINOPHEN 650 MG RE SUPP: 650.0000 mg | Freq: Four times a day (QID) | RECTAL | Status: DC | PRN

## 2014-06-13 MED ORDER — FENTANYL CITRATE 0.05 MG/ML IJ SOLN
INTRAMUSCULAR | Status: DC | PRN
  Administered 2014-06-13 (×5): 50 ug via INTRAVENOUS

## 2014-06-13 MED ORDER — ALUM & MAG HYDROXIDE-SIMETH 200-200-20 MG/5ML PO SUSP: 30.0000 mL | ORAL | Status: DC | PRN

## 2014-06-13 MED ORDER — METOCLOPRAMIDE HCL 5 MG/ML IJ SOLN
INTRAMUSCULAR | Status: AC
Start: 2014-06-13 — End: 2014-06-13
  Administered 2014-06-13: 12:00:00
  Filled 2014-06-13: qty 2

## 2014-06-13 MED ORDER — LIDOCAINE HCL (CARDIAC) 20 MG/ML IV SOLN
INTRAVENOUS | Status: DC | PRN
  Administered 2014-06-13: 80 mg via INTRAVENOUS

## 2014-06-13 MED ORDER — ONDANSETRON HCL 4 MG/2ML IJ SOLN
INTRAMUSCULAR | Status: DC | PRN
  Administered 2014-06-13: 4 mg via INTRAVENOUS

## 2014-06-13 MED ORDER — POTASSIUM CHLORIDE IN NACL 20-0.9 MEQ/L-% IV SOLN
INTRAVENOUS | Status: DC
  Administered 2014-06-13: 18:00:00 via INTRAVENOUS
  Filled 2014-06-13 (×3): qty 1000

## 2014-06-13 MED ORDER — PROMETHAZINE HCL 25 MG/ML IJ SOLN
6.2500 mg | Freq: Four times a day (QID) | INTRAMUSCULAR | Status: DC | PRN
  Administered 2014-06-13: 6.25 mg via INTRAVENOUS
  Filled 2014-06-13: qty 1

## 2014-06-13 MED ORDER — TEMAZEPAM 15 MG PO CAPS: 15.0000 mg | ORAL_CAPSULE | Freq: Every evening | ORAL | Status: DC | PRN

## 2014-06-13 MED ORDER — BISACODYL 5 MG PO TBEC: 5.0000 mg | DELAYED_RELEASE_TABLET | Freq: Every day | ORAL | Status: DC | PRN

## 2014-06-13 MED ORDER — ONDANSETRON HCL 4 MG PO TABS: 4.0000 mg | ORAL_TABLET | Freq: Four times a day (QID) | ORAL | Status: DC | PRN

## 2014-06-13 MED ORDER — METHOCARBAMOL 500 MG PO TABS: 500.0000 mg | ORAL_TABLET | Freq: Four times a day (QID) | ORAL | Status: DC

## 2014-06-13 MED ORDER — HYDROCODONE-ACETAMINOPHEN 5-325 MG PO TABS: 1.0000 | ORAL_TABLET | ORAL | Status: DC | PRN

## 2014-06-13 MED ORDER — METOCLOPRAMIDE HCL 5 MG PO TABS
5.0000 mg | ORAL_TABLET | Freq: Three times a day (TID) | ORAL | Status: DC | PRN
  Filled 2014-06-13: qty 2

## 2014-06-13 MED ORDER — HYDROMORPHONE HCL PF 1 MG/ML IJ SOLN
0.5000 mg | INTRAMUSCULAR | Status: DC | PRN
  Administered 2014-06-13: 1 mg via INTRAVENOUS
  Filled 2014-06-13: qty 1

## 2014-06-13 MED ORDER — BUPIVACAINE HCL (PF) 0.25 % IJ SOLN
INTRAMUSCULAR | Status: AC
  Filled 2014-06-13: qty 30

## 2014-06-13 MED ORDER — MIDAZOLAM HCL 2 MG/2ML IJ SOLN
INTRAMUSCULAR | Status: AC
  Filled 2014-06-13: qty 2

## 2014-06-13 MED ORDER — ONDANSETRON HCL 4 MG/2ML IJ SOLN
4.0000 mg | Freq: Four times a day (QID) | INTRAMUSCULAR | Status: DC | PRN
  Administered 2014-06-13: 4 mg via INTRAVENOUS
  Filled 2014-06-13: qty 2

## 2014-06-13 MED ORDER — CEFAZOLIN SODIUM 1-5 GM-% IV SOLN
1.0000 g | Freq: Four times a day (QID) | INTRAVENOUS | Status: AC
  Administered 2014-06-13 – 2014-06-14 (×2): 1 g via INTRAVENOUS
  Filled 2014-06-13 (×3): qty 50

## 2014-06-13 MED ORDER — BUPIVACAINE LIPOSOME 1.3 % IJ SUSP
INTRAMUSCULAR | Status: DC | PRN
  Administered 2014-06-13: 20 mL

## 2014-06-13 MED ORDER — DEXAMETHASONE SODIUM PHOSPHATE 10 MG/ML IJ SOLN
10.0000 mg | Freq: Three times a day (TID) | INTRAMUSCULAR | Status: AC
  Administered 2014-06-13: 10 mg via INTRAVENOUS
  Filled 2014-06-13 (×3): qty 1

## 2014-06-13 MED ORDER — LACTATED RINGERS IV SOLN: INTRAVENOUS | Status: DC

## 2014-06-13 MED ORDER — ONDANSETRON HCL 4 MG PO TABS: 4.0000 mg | ORAL_TABLET | Freq: Three times a day (TID) | ORAL | Status: DC | PRN

## 2014-06-13 MED ORDER — ONDANSETRON HCL 4 MG/2ML IJ SOLN
INTRAMUSCULAR | Status: AC
Start: 2014-06-13 — End: 2014-06-13
  Filled 2014-06-13: qty 2

## 2014-06-13 MED ORDER — OXYCODONE HCL 5 MG PO TABS
5.0000 mg | ORAL_TABLET | ORAL | Status: DC | PRN
  Administered 2014-06-13: 10 mg via ORAL

## 2014-06-13 MED ORDER — OXYCODONE HCL 5 MG PO TABS
ORAL_TABLET | ORAL | Status: AC
  Administered 2014-06-13: 10 mg via ORAL
  Filled 2014-06-13: qty 2

## 2014-06-13 MED ORDER — ENOXAPARIN SODIUM 30 MG/0.3ML ~~LOC~~ SOLN: 30.0000 mg | Freq: Two times a day (BID) | SUBCUTANEOUS | Status: DC

## 2014-06-13 MED ORDER — CHLORHEXIDINE GLUCONATE 4 % EX LIQD
60.0000 mL | Freq: Once | CUTANEOUS | Status: DC
  Filled 2014-06-13: qty 60

## 2014-06-13 MED ORDER — BUPIVACAINE HCL (PF) 0.25 % IJ SOLN
INTRAMUSCULAR | Status: DC | PRN
  Administered 2014-06-13: 30 mL

## 2014-06-13 MED ORDER — METHOCARBAMOL 500 MG PO TABS
500.0000 mg | ORAL_TABLET | Freq: Four times a day (QID) | ORAL | Status: DC | PRN
  Administered 2014-06-13 – 2014-06-14 (×3): 500 mg via ORAL
  Filled 2014-06-13 (×2): qty 1

## 2014-06-13 MED ORDER — SIMVASTATIN 40 MG PO TABS
40.0000 mg | ORAL_TABLET | Freq: Every evening | ORAL | Status: DC
  Administered 2014-06-13: 40 mg via ORAL
  Filled 2014-06-13 (×2): qty 1

## 2014-06-13 MED ORDER — SODIUM CHLORIDE 0.9 % IR SOLN
Status: DC | PRN
  Administered 2014-06-13: 1000 mL

## 2014-06-13 MED ORDER — FENTANYL CITRATE 0.05 MG/ML IJ SOLN
INTRAMUSCULAR | Status: AC
  Filled 2014-06-13: qty 5

## 2014-06-13 MED ORDER — ONDANSETRON HCL 4 MG/2ML IJ SOLN: 4.0000 mg | Freq: Once | INTRAMUSCULAR | Status: DC | PRN

## 2014-06-13 MED ORDER — HYDROMORPHONE HCL PF 1 MG/ML IJ SOLN
0.2500 mg | INTRAMUSCULAR | Status: DC | PRN
  Administered 2014-06-13 (×4): 0.5 mg via INTRAVENOUS

## 2014-06-13 MED ORDER — HYDROMORPHONE HCL PF 1 MG/ML IJ SOLN
INTRAMUSCULAR | Status: AC
  Administered 2014-06-13: 0.5 mg via INTRAVENOUS
  Filled 2014-06-13: qty 1

## 2014-06-13 MED ORDER — METHOCARBAMOL 1000 MG/10ML IJ SOLN
500.0000 mg | Freq: Four times a day (QID) | INTRAMUSCULAR | Status: DC | PRN
  Filled 2014-06-13: qty 5

## 2014-06-13 MED ORDER — HYDROCODONE-ACETAMINOPHEN 5-325 MG PO TABS
1.0000 | ORAL_TABLET | ORAL | Status: DC | PRN
  Administered 2014-06-13: 1 via ORAL
  Administered 2014-06-13: 2 via ORAL
  Administered 2014-06-13: 1 via ORAL
  Administered 2014-06-14 (×3): 2 via ORAL
  Filled 2014-06-13: qty 2
  Filled 2014-06-13 (×2): qty 1
  Filled 2014-06-13 (×3): qty 2

## 2014-06-13 MED ORDER — MENTHOL 3 MG MT LOZG: 1.0000 | LOZENGE | OROMUCOSAL | Status: DC | PRN

## 2014-06-13 MED ORDER — SODIUM CHLORIDE 0.9 % IJ SOLN
INTRAMUSCULAR | Status: DC | PRN
  Administered 2014-06-13: 10 mL

## 2014-06-13 MED ORDER — AMLODIPINE BESYLATE 10 MG PO TABS
10.0000 mg | ORAL_TABLET | Freq: Every day | ORAL | Status: DC
Start: 2014-06-14 — End: 2014-06-14
  Administered 2014-06-14: 10 mg via ORAL
  Filled 2014-06-13: qty 1

## 2014-06-13 MED ORDER — LACTATED RINGERS IV SOLN
INTRAVENOUS | Status: DC | PRN
  Administered 2014-06-13 (×2): via INTRAVENOUS

## 2014-06-13 MED ORDER — PHENOL 1.4 % MT LIQD: 1.0000 | OROMUCOSAL | Status: DC | PRN

## 2014-06-13 MED ORDER — DOCUSATE SODIUM 100 MG PO CAPS
100.0000 mg | ORAL_CAPSULE | Freq: Two times a day (BID) | ORAL | Status: DC
  Administered 2014-06-13 – 2014-06-14 (×2): 100 mg via ORAL
  Filled 2014-06-13 (×2): qty 1

## 2014-06-13 MED ORDER — DEXAMETHASONE 4 MG PO TABS
10.0000 mg | ORAL_TABLET | Freq: Three times a day (TID) | ORAL | Status: AC
  Administered 2014-06-13 – 2014-06-14 (×2): 10 mg via ORAL
  Filled 2014-06-13 (×3): qty 1

## 2014-06-13 MED ORDER — BUPIVACAINE LIPOSOME 1.3 % IJ SUSP
20.0000 mL | INTRAMUSCULAR | Status: DC
  Filled 2014-06-13: qty 20

## 2014-06-13 MED ORDER — METHOTREXATE 2.5 MG PO TABS: 20.0000 mg | ORAL_TABLET | ORAL | Status: DC

## 2014-06-13 MED ORDER — ATENOLOL 100 MG PO TABS
100.0000 mg | ORAL_TABLET | Freq: Every day | ORAL | Status: DC
  Administered 2014-06-14: 100 mg via ORAL
  Filled 2014-06-13: qty 1

## 2014-06-13 MED ORDER — METHOCARBAMOL 500 MG PO TABS
ORAL_TABLET | ORAL | Status: AC
  Administered 2014-06-13: 500 mg via ORAL
  Filled 2014-06-13: qty 1

## 2014-06-13 MED ORDER — DIPHENHYDRAMINE HCL 12.5 MG/5ML PO ELIX: 12.5000 mg | ORAL_SOLUTION | ORAL | Status: DC | PRN

## 2014-06-13 MED ORDER — PROPOFOL 10 MG/ML IV BOLUS
INTRAVENOUS | Status: DC | PRN
  Administered 2014-06-13: 100 mg via INTRAVENOUS

## 2014-06-13 MED ORDER — PROPOFOL 10 MG/ML IV BOLUS
INTRAVENOUS | Status: AC
  Filled 2014-06-13: qty 20

## 2014-06-13 MED ORDER — ACETAMINOPHEN 325 MG PO TABS: 650.0000 mg | ORAL_TABLET | Freq: Four times a day (QID) | ORAL | Status: DC | PRN

## 2014-06-13 SURGICAL SUPPLY — 67 items
BANDAGE ELASTIC 4 VELCRO ST LF (GAUZE/BANDAGES/DRESSINGS) ×2 IMPLANT
BANDAGE ELASTIC 6 VELCRO ST LF (GAUZE/BANDAGES/DRESSINGS) ×2 IMPLANT
BANDAGE ESMARK 6X9 LF (GAUZE/BANDAGES/DRESSINGS) ×1 IMPLANT
BENZOIN TINCTURE PRP APPL 2/3 (GAUZE/BANDAGES/DRESSINGS) ×2 IMPLANT
BLADE SAG 18X100X1.27 (BLADE) ×4 IMPLANT
BNDG COHESIVE 4X5 TAN STRL (GAUZE/BANDAGES/DRESSINGS) ×2 IMPLANT
BNDG COHESIVE 6X5 TAN STRL LF (GAUZE/BANDAGES/DRESSINGS) ×2 IMPLANT
BNDG ESMARK 6X9 LF (GAUZE/BANDAGES/DRESSINGS) ×2
BONE CEMENT PALACOSE (Orthopedic Implant) ×4 IMPLANT
BOWL SMART MIX CTS (DISPOSABLE) ×2 IMPLANT
CEMENT BONE PALACOSE (Orthopedic Implant) ×2 IMPLANT
CLSR STERI-STRIP ANTIMIC 1/2X4 (GAUZE/BANDAGES/DRESSINGS) ×2 IMPLANT
COVER SURGICAL LIGHT HANDLE (MISCELLANEOUS) ×2 IMPLANT
CUFF TOURNIQUET SINGLE 34IN LL (TOURNIQUET CUFF) ×2 IMPLANT
DRAPE EXTREMITY T 121X128X90 (DRAPE) ×2 IMPLANT
DRAPE PROXIMA HALF (DRAPES) ×2 IMPLANT
DRAPE U-SHAPE 47X51 STRL (DRAPES) ×2 IMPLANT
DRSG PAD ABDOMINAL 8X10 ST (GAUZE/BANDAGES/DRESSINGS) ×2 IMPLANT
DURAPREP 26ML APPLICATOR (WOUND CARE) ×4 IMPLANT
ELECT CAUTERY BLADE 6.4 (BLADE) ×2 IMPLANT
ELECT REM PT RETURN 9FT ADLT (ELECTROSURGICAL) ×2
ELECTRODE REM PT RTRN 9FT ADLT (ELECTROSURGICAL) ×1 IMPLANT
EVACUATOR 1/8 PVC DRAIN (DRAIN) ×2 IMPLANT
FACESHIELD WRAPAROUND (MASK) ×4 IMPLANT
GAUZE SPONGE 4X4 12PLY STRL (GAUZE/BANDAGES/DRESSINGS) ×2 IMPLANT
GLOVE BIOGEL PI IND STRL 7.0 (GLOVE) ×2 IMPLANT
GLOVE BIOGEL PI INDICATOR 7.0 (GLOVE) ×2
GLOVE ECLIPSE 6.5 STRL STRAW (GLOVE) ×4 IMPLANT
GLOVE ORTHO TXT STRL SZ7.5 (GLOVE) ×2 IMPLANT
GOWN STRL REUS W/ TWL LRG LVL3 (GOWN DISPOSABLE) ×1 IMPLANT
GOWN STRL REUS W/ TWL XL LVL3 (GOWN DISPOSABLE) ×1 IMPLANT
GOWN STRL REUS W/TWL LRG LVL3 (GOWN DISPOSABLE) ×1
GOWN STRL REUS W/TWL XL LVL3 (GOWN DISPOSABLE) ×1
HANDPIECE INTERPULSE COAX TIP (DISPOSABLE) ×1
IMMOBILIZER KNEE 22 UNIV (SOFTGOODS) ×2 IMPLANT
IMMOBILIZER KNEE 24 THIGH 36 (MISCELLANEOUS) IMPLANT
IMMOBILIZER KNEE 24 UNIV (MISCELLANEOUS)
KIT BASIN OR (CUSTOM PROCEDURE TRAY) ×2 IMPLANT
KIT ROOM TURNOVER OR (KITS) ×2 IMPLANT
KNEE/VIT E POLY LINER LEVEL 1B ×2 IMPLANT
MANIFOLD NEPTUNE II (INSTRUMENTS) ×2 IMPLANT
NEEDLE 18GX1X1/2 (RX/OR ONLY) (NEEDLE) ×2 IMPLANT
NEEDLE 25GX 5/8IN NON SAFETY (NEEDLE) ×2 IMPLANT
NS IRRIG 1000ML POUR BTL (IV SOLUTION) ×2 IMPLANT
PACK TOTAL JOINT (CUSTOM PROCEDURE TRAY) ×2 IMPLANT
PAD ARMBOARD 7.5X6 YLW CONV (MISCELLANEOUS) ×4 IMPLANT
PAD CAST 4YDX4 CTTN HI CHSV (CAST SUPPLIES) ×1 IMPLANT
PADDING CAST ABS 6INX4YD NS (CAST SUPPLIES) ×1
PADDING CAST ABS COTTON 6X4 NS (CAST SUPPLIES) ×1 IMPLANT
PADDING CAST COTTON 4X4 STRL (CAST SUPPLIES) ×1
PADDING CAST COTTON 6X4 STRL (CAST SUPPLIES) ×2 IMPLANT
SET HNDPC FAN SPRY TIP SCT (DISPOSABLE) ×1 IMPLANT
SPONGE GAUZE 4X4 12PLY STER LF (GAUZE/BANDAGES/DRESSINGS) ×2 IMPLANT
STRIP CLOSURE SKIN 1/2X4 (GAUZE/BANDAGES/DRESSINGS) ×4 IMPLANT
SUCTION FRAZIER TIP 10 FR DISP (SUCTIONS) ×2 IMPLANT
SUT MNCRL AB 4-0 PS2 18 (SUTURE) ×2 IMPLANT
SUT VIC AB 0 CT1 27 (SUTURE)
SUT VIC AB 0 CT1 27XBRD ANBCTR (SUTURE) IMPLANT
SUT VIC AB 1 CT1 27 (SUTURE) ×2
SUT VIC AB 1 CT1 27XBRD ANBCTR (SUTURE) ×2 IMPLANT
SUT VIC AB 2-0 CT1 27 (SUTURE) ×3
SUT VIC AB 2-0 CT1 TAPERPNT 27 (SUTURE) ×3 IMPLANT
SYR 50ML LL SCALE MARK (SYRINGE) ×2 IMPLANT
SYR CONTROL 10ML LL (SYRINGE) ×2 IMPLANT
TOWEL OR 17X24 6PK STRL BLUE (TOWEL DISPOSABLE) ×2 IMPLANT
TOWEL OR 17X26 10 PK STRL BLUE (TOWEL DISPOSABLE) ×2 IMPLANT
WATER STERILE IRR 1000ML POUR (IV SOLUTION) ×4 IMPLANT

## 2014-06-13 NOTE — Anesthesia Postprocedure Evaluation (Signed)
  Anesthesia Post-op Note  Patient: Briana [name]  Procedure(s) Performed: Procedure(s): TOTAL KNEE ARTHROPLASTY (Left)  Patient Location: PACU  Anesthesia Type:General  Level of Consciousness: awake, oriented, sedated and patient cooperative  Airway and Oxygen Therapy: Patient Spontanous Breathing  Post-op Pain: mild  Post-op Assessment: Post-op Vital signs reviewed, Patient's Cardiovascular Status Stable, Respiratory Function Stable, Patent Airway, No signs of Nausea or vomiting and Pain level controlled  Post-op Vital Signs: stable  Last Vitals:  Filed Vitals:   06/13/14 1100  BP:   Pulse: 55  Temp:   Resp: 16    Complications: No apparent anesthesia complications

## 2014-06-13 NOTE — Progress Notes (Signed)
Orthopedic Tech Progress Note Patient Details:  Briana [name] 07-Nov-1930 [id number]  CPM Left Knee CPM Left Knee: On Left Knee Flexion (Degrees): 90 Left Knee Extension (Degrees): 0 Additional Comments: Trapeze bar   [name] 06/13/2014, 12:51 PM

## 2014-06-13 NOTE — Interval H&P Note (Signed)
History and Physical Interval Note:  06/13/2014 8:26 AM  Briana [name]  has presented today for surgery, with the diagnosis of djd left knee  The various methods of treatment have been discussed with the patient and family. After consideration of risks, benefits and other options for treatment, the patient has consented to  Procedure(s): TOTAL KNEE ARTHROPLASTY (Left) as a surgical intervention .  The patient's history has been reviewed, patient examined, no change in status, stable for surgery.  I have reviewed the patient's chart and labs.  Questions were answered to the patient's satisfaction.     Colby Reels F

## 2014-06-13 NOTE — Discharge Summary (Addendum)
Patient ID: Briana [name] MRN: [id number] DOB/AGE: [birth year] 80 y.o.  Admit date: 06/13/2014 Discharge date: 06/14/2014  Admission Diagnoses:  Active Problems:   DJD (degenerative joint disease) of knee   Discharge Diagnoses:  Same  Past Medical History  Diagnosis Date  . ACUTE URIS OF UNSPECIFIED SITE 11/26/2008  . ALLERGIC RHINITIS 10/28/2007  . ARTHRITIS, KNEES, BILATERAL 10/28/2007  . BACK PAIN 04/12/2009  . BUNIONECTOMY, HX OF 10/28/2007  . CELLULITIS&ABSCESS OF HAND EXCEPT FINGERS&THUMB 10/23/2010  . HYPERLIPIDEMIA 10/28/2007  . HYPERTENSION 10/28/2007  . INSOMNIA UNSPECIFIED 10/31/2007  . MYOSITIS 10/22/2010  . NECK PAIN 12/03/2008  . Pain in joint, site unspecified 10/07/2010  . WRIST PAIN, LEFT 10/22/2010  . ALLERGIC ARTHRITIS OTHER SPECIFIED SITES 10/22/2010    OA + RA  . Complication of anesthesia     itching and makes me crazy  . Family history of anesthesia complication     DAUGHTER HAD HIVES AND " WENT WILD "    Surgeries: Procedure(s): TOTAL KNEE ARTHROPLASTY on 06/13/2014   Consultants:    Discharged Condition: Improved  Hospital Course: Briana [name] is an 80 y.o. female who was admitted 06/13/2014 for operative treatment of left knee osteoarthritis. Patient has severe unremitting pain that affects sleep, daily activities, and work/hobbies. After pre-op clearance the patient was taken to the operating room on 06/13/2014 and underwent  Procedure(s): TOTAL KNEE ARTHROPLASTY.  Patient developed ABLA pod#1.  She is asymptomatic but we will continue to follow.  Patient was given perioperative antibiotics:     Anti-infectives   Start     Dose/Rate Route Frequency Ordered Stop   06/13/14 1400  ceFAZolin (ANCEF) IVPB 1 g/50 mL premix     1 g 100 mL/hr over 30 Minutes Intravenous Every 6 hours 06/13/14 1224 06/14/14 0434   06/13/14 0600  ceFAZolin (ANCEF) IVPB 2 g/50 mL premix     2 g 100 mL/hr over 30 Minutes Intravenous On call to O.R. 06/12/14 1409 06/13/14 0846        Patient was given sequential compression devices, early ambulation, and chemoprophylaxis to prevent DVT.  Patient benefited maximally from hospital stay and there were no complications.    Recent vital signs:  Patient Vitals for the past 24 hrs:  BP Temp Temp src Pulse Resp SpO2  06/14/14 0900 156/68 mmHg 97.6 F (36.4 C) Oral 86 16 100 %  06/14/14 0600 137/50 mmHg 98 F (36.7 C) - 71 16 100 %  06/14/14 0200 146/65 mmHg 97.9 F (36.6 C) - 71 16 100 %  06/13/14 2154 134/50 mmHg 97.3 F (36.3 C) - 65 16 97 %  06/13/14 1319 136/51 mmHg - - 58 16 98 %  06/13/14 1219 134/52 mmHg 97.4 F (36.3 C) - 54 15 100 %  06/13/14 1145 - - - 57 13 98 %     Recent laboratory studies:   Recent Labs  06/13/14 1300 06/14/14 0700  WBC 12.3* 9.1  HGB 11.5* 10.1*  HCT 33.6* 29.0*  PLT 182 146*  NA  --  139  K  --  4.5  CL  --  102  CO2  --  20  BUN  --  16  CREATININE 0.76 0.78  GLUCOSE  --  206*  CALCIUM  --  8.3*     Discharge Medications:     Medication List    STOP taking these medications       acetaminophen 650 MG CR tablet  Commonly known as:  TYLENOL  TAKE these medications       amLODipine 10 MG tablet  Commonly known as:  NORVASC  Take 1 tablet (10 mg total) by mouth daily.     atenolol 100 MG tablet  Commonly known as:  TENORMIN  Take 1 tablet (100 mg total) by mouth daily.     bisacodyl 5 MG EC tablet  Commonly known as:  DULCOLAX  Take 1 tablet (5 mg total) by mouth daily as needed for moderate constipation.     enoxaparin 30 MG/0.3ML injection  Commonly known as:  LOVENOX  Inject 0.3 mLs (30 mg total) into the skin every 12 (twelve) hours.     fexofenadine 180 MG tablet  Commonly known as:  ALLEGRA  Take 180 mg by mouth daily.     folic acid 1 MG tablet  Commonly known as:  FOLVITE  Take 1 mg by mouth daily.     HYDROcodone-acetaminophen 5-325 MG per tablet  Commonly known as:  NORCO  Take 1 tablet by mouth every 4 (four) hours as needed  for moderate pain.     methocarbamol 500 MG tablet  Commonly known as:  ROBAXIN  Take 1 tablet (500 mg total) by mouth 4 (four) times daily.     methotrexate 2.5 MG tablet  Commonly known as:  RHEUMATREX  Take 20 mg by mouth once a week. Caution:Chemotherapy. Protect from light.  Taken on Fridays.     ondansetron 4 MG tablet  Commonly known as:  ZOFRAN  Take 1 tablet (4 mg total) by mouth every 8 (eight) hours as needed for nausea or vomiting.     simvastatin 40 MG tablet  Commonly known as:  ZOCOR  Take 1 tablet (40 mg total) by mouth every evening.     temazepam 15 MG capsule  Commonly known as:  RESTORIL  Take 1 capsule (15 mg total) by mouth at bedtime as needed for sleep.     Vitamin D 2000 UNITS tablet  Take 2,000 Units by mouth daily.        Diagnostic Studies: Dg Chest 2 View  06/05/2014   CLINICAL DATA:  Preop total knee replacement.  Hypertension.  EXAM: CHEST  2 VIEW  COMPARISON:  11/11/2012  FINDINGS: The heart size and mediastinal contours are within normal limits. Both lungs are clear. The visualized skeletal structures are unremarkable.  IMPRESSION: No active cardiopulmonary disease.   Electronically Signed   By: Rolm Baptise M.D.   On: 06/05/2014 13:03   Dg Knee Left Port  06/13/2014   CLINICAL DATA:  Postop  EXAM: PORTABLE LEFT KNEE - 1-2 VIEW  COMPARISON:  01/20/2007  FINDINGS: Two views of the left knee submitted. There is left knee prosthesis in anatomic alignment. Postsurgical changes with surgical drain in place and small amount of periarticular soft tissue air.  IMPRESSION: Left knee prosthesis in anatomic alignment.   Electronically Signed   By: Lahoma Crocker M.D.   On: 06/13/2014 11:10    Disposition: 01-Home or Self Care  Discharge Instructions   CPM    Complete by:  As directed   Continuous passive motion machine (CPM):      Use the CPM from 0- to 60 for 6 hours per day.      You may increase by 10 per day.  You may break it up into 2 or 3 sessions per  day.      Use CPM for 2-3 weeks or until you are told to stop.  Call MD / Call 911    Complete by:  As directed   If you experience chest pain or shortness of breath, CALL 911 and be transported to the hospital emergency room.  If you develope a fever above 101 F, pus (white drainage) or increased drainage or redness at the wound, or calf pain, call your surgeon's office.     Change dressing    Complete by:  As directed   Change dressing on Saturday, then change the dressing daily with sterile 4 x 4 inch gauze dressing and apply TED hose.  You may clean the incision with alcohol prior to redressing.     Constipation Prevention    Complete by:  As directed   Drink plenty of fluids.  Prune juice may be helpful.  You may use a stool softener, such as Colace (over the counter) 100 mg twice a day.  Use MiraLax (over the counter) for constipation as needed.     Diet - low sodium heart healthy    Complete by:  As directed      Discharge instructions    Complete by:  As directed   Weight bearing as tolerated.  Inject Lovenox twice daily as directed for a total of 7 days after surgery to prevent blood clots.  Change dressing daily.  May shower on Monday, but do not soak incision.  May apply ice for up to 20 minutes at a time for pain and swelling.  Follow up appointment two weeks after surgery.     Do not put a pillow under the knee. Place it under the heel.    Complete by:  As directed   Place gray foam under operative heel when in bed or in a chair to work on extension     Increase activity slowly as tolerated    Complete by:  As directed      TED hose    Complete by:  As directed   Use stockings (TED hose) for 2 weeks on both leg(s).  You may remove them at night for sleeping.           Follow-up Information   Schedule an appointment as soon as possible for a visit in 2 weeks to follow up.       SignedLarae Grooms 06/14/2014, 11:40 AM

## 2014-06-13 NOTE — H&P (View-Only) (Signed)
TOTAL KNEE ADMISSION H&P  Patient is being admitted for left total knee arthroplasty.  Subjective:  Chief Complaint:left knee pain.  HPI: Briana [name], 80 y.o. female, has a history of pain and functional disability in the left knee due to trauma and has failed non-surgical conservative treatments for greater than 12 weeks to includeNSAID's and/or analgesics, corticosteriod injections, viscosupplementation injections and activity modification.  Onset of symptoms was gradual, starting 6 years ago with gradually worsening course since that time. The patient noted no past surgery on the left knee(s).  Patient currently rates pain in the left knee(s) at 9 out of 10 with activity. Patient has night pain, worsening of pain with activity and weight bearing, pain that interferes with activities of daily living, crepitus and joint swelling.  Patient has evidence of subchondral sclerosis and joint space narrowing by imaging studies. There is no active infection.  Patient Active Problem List   Diagnosis Date Noted  . Other abnormal glucose 04/19/2014  . Routine health maintenance 05/30/2012  . INSOMNIA UNSPECIFIED 10/31/2007  . Hyperlipidemia with target LDL less than 130 10/28/2007  . HYPERTENSION 10/28/2007  . Seasonal allergic rhinitis 10/28/2007  . DJD (degenerative joint disease) of knee 10/28/2007   Past Medical History  Diagnosis Date  . ACUTE URIS OF UNSPECIFIED SITE 11/26/2008  . ALLERGIC RHINITIS 10/28/2007  . ARTHRITIS, KNEES, BILATERAL 10/28/2007  . BACK PAIN 04/12/2009  . BUNIONECTOMY, HX OF 10/28/2007  . CELLULITIS&ABSCESS OF HAND EXCEPT FINGERS&THUMB 10/23/2010  . HYPERLIPIDEMIA 10/28/2007  . HYPERTENSION 10/28/2007  . INSOMNIA UNSPECIFIED 10/31/2007  . MYOSITIS 10/22/2010  . NECK PAIN 12/03/2008  . Pain in joint, site unspecified 10/07/2010  . WRIST PAIN, LEFT 10/22/2010  . ALLERGIC ARTHRITIS OTHER SPECIFIED SITES 10/22/2010    OA + RA    Past Surgical History  Procedure Laterality  Date  . Arthroscopic surgery and rotator cuff repair left shoulder    . Arthroscopy right knee hx of surgery    . Hand surgery for broken finger, remote    . Bunionectomy      hx of  . Foot surgery      RIGHT  . Total knee arthroplasty  07/01/2012    Procedure: TOTAL KNEE ARTHROPLASTY;  Surgeon: Yvette Rack., MD;  Location: [facility];  Service: Orthopedics;  Laterality: Right;  right total knee arthroplasty     (Not in a hospital admission) Allergies  Allergen Reactions  . Ibuprofen Anaphylaxis  . Diltiazem Hcl     REACTION: ANGIO EDEMA  . Hydrochlorothiazide     REACTION: SWELLING  . Lisinopril     REACTION: SWELLING  . Oxycodone Nausea And Vomiting  . Verapamil     REACTION: SWELLING    History  Substance Use Topics  . Smoking status: Never Smoker   . Smokeless tobacco: Never Used  . Alcohol Use: No    Family History  Problem Relation Age of Onset  . Cancer Brother     lung cancer  . Coronary artery disease Other   . Heart attack Other      Review of Systems  Constitutional: Negative.   HENT: Negative.   Eyes: Negative.   Respiratory: Negative.   Cardiovascular: Negative.   Gastrointestinal: Negative.   Genitourinary: Negative.   Musculoskeletal: Positive for joint pain.  Skin: Negative.   Neurological: Negative.   Endo/Heme/Allergies: Negative.   Psychiatric/Behavioral: Negative.     Objective:  Physical Exam  Constitutional: She is oriented to person, place, and time. She appears  well-developed and well-nourished.  HENT:  Head: Normocephalic and atraumatic.  Eyes: EOM are normal. Pupils are equal, round, and reactive to light.  Neck: Normal range of motion. Neck supple. No thyromegaly present.  Cardiovascular: Normal rate, regular rhythm and normal heart sounds.  Exam reveals no gallop and no friction rub.   No murmur heard. Respiratory: Effort normal and breath sounds normal. No respiratory distress. She has no wheezes.  GI: Soft. Bowel sounds are  normal. She exhibits no distension.  Musculoskeletal:  Examination of her bilateral knees reveals valgus thrust.  Range of motion is 0 to 125 degrees. Negative log roll.  Positive straight leg raise bilaterally.  She does have some tenderness to palpation on the lateral joint line of the left knee.  Ligaments are stable.  Minimal patellofemoral crepitus noted.  She is neurovascularly intact distally.     Neurological: She is alert and oriented to person, place, and time.  Skin: Skin is warm and dry.  Psychiatric: She has a normal mood and affect. Her behavior is normal. Judgment and thought content normal.    Vital signs in last 24 hours: @VSRANGES @  Labs:   Estimated body mass index is 31.05 kg/(m^2) as calculated from the following:   Height as of 04/19/14: 5\' 4"  (1.626 m).   Weight as of 04/19/14: 82.101 kg (181 lb).   Imaging Review Plain radiographs demonstrate severe degenerative joint disease of the left knee(s). The overall alignment ismild varus. The bone quality appears to be fair for age and reported activity level.  Assessment/Plan:  End stage arthritis, left knee   The patient history, physical examination, clinical judgment of the provider and imaging studies are consistent with end stage degenerative joint disease of the left knee(s) and total knee arthroplasty is deemed medically necessary. The treatment options including medical management, injection therapy arthroscopy and arthroplasty were discussed at length. The risks and benefits of total knee arthroplasty were presented and reviewed. The risks due to aseptic loosening, infection, stiffness, patella tracking problems, thromboembolic complications and other imponderables were discussed. The patient acknowledged the explanation, agreed to proceed with the plan and consent was signed. Patient is being admitted for inpatient treatment for surgery, pain control, PT, OT, prophylactic antibiotics, VTE prophylaxis, progressive  ambulation and ADL's and discharge planning. The patient is planning to be discharged to skilled nursing facility

## 2014-06-13 NOTE — Anesthesia Preprocedure Evaluation (Addendum)
Anesthesia Evaluation  Patient identified by MRN, date of birth, ID band Patient awake    Reviewed: Allergy & Precautions, H&P , NPO status , Patient's Chart, lab work & pertinent test results  Airway Mallampati: II TM Distance: >3 FB Neck ROM: Full    Dental  (+) Edentulous Upper, Edentulous Lower, Dental Advisory Given   Pulmonary          Cardiovascular hypertension, Pt. on medications     Neuro/Psych    GI/Hepatic   Endo/Other    Renal/GU      Musculoskeletal   Abdominal   Peds  Hematology   Anesthesia Other Findings   Reproductive/Obstetrics                         Anesthesia Physical Anesthesia Plan  ASA: II  Anesthesia Plan: General   Post-op Pain Management:    Induction: Intravenous  Airway Management Planned: LMA  Additional Equipment:   Intra-op Plan:   Post-operative Plan: Extubation in OR  Informed Consent: I have reviewed the patients History and Physical, chart, labs and discussed the procedure including the risks, benefits and alternatives for the proposed anesthesia with the patient or authorized representative who has indicated his/her understanding and acceptance.   Dental advisory given  Plan Discussed with: CRNA, Anesthesiologist and Surgeon  Anesthesia Plan Comments:         Anesthesia Quick Evaluation

## 2014-06-13 NOTE — Transfer of Care (Signed)
Immediate Anesthesia Transfer of Care Note  Patient: Briana [name]  Procedure(s) Performed: Procedure(s): TOTAL KNEE ARTHROPLASTY (Left)  Patient Location: PACU  Anesthesia Type:GA combined with regional for post-op pain  Level of Consciousness: awake  Airway & Oxygen Therapy: Patient Spontanous Breathing and Patient connected to face mask oxygen  Post-op Assessment: Report given to PACU RN and Post -op Vital signs reviewed and stable  Post vital signs: Reviewed and stable  Complications: No apparent anesthesia complications

## 2014-06-13 NOTE — Discharge Instructions (Signed)
Total Knee Replacement, Care After Refer to this sheet in the next few weeks. These instructions provide you with information on caring for yourself after your procedure. Your health care provider also may give you specific instructions. Your treatment has been planned according to the most current medical practices, but problems sometimes occur. Call your health care provider if you have any problems or questions after your procedure. HOME CARE INSTRUCTIONS   Weight bearing as tolerated.  Inject Lovenox twice daily for a total of 7 days following surgery to prevent blood clots.  Change bandage daily starting on Saturday.  May shower on Monday, but do not soak incision.  May apply ice for up to 20 minutes at a time for pain and swelling.  Follow up appointment in two weeks.    See a physical therapist as directed by your health care provider.  Take medicines only as directed by your health care provider.  Avoid lifting or driving until you are instructed otherwise.  If you have been sent home with a continuous passive motion machine, use it as directed by your health care provider. SEEK MEDICAL CARE IF:  You have difficulty breathing.  You have drainage, redness, swelling, or pain at your incision site.  You have a bad smell coming from your incision site.  You have persistent bleeding from your incision site.  Your incision breaks open after sutures (stitches) or staples have been removed.  You have a fever. SEEK IMMEDIATE MEDICAL CARE IF:   You have a rash.  You have pain or swelling in your calf or thigh.  You have shortness of breath or chest pain.  Your range of motion in your knee is decreasing rather than increasing. MAKE SURE YOU:   Understand these instructions.  Will watch your condition.  Will get help right away if you are not doing well or get worse. Document Released: 04/17/2005 Document Revised: 02/12/2014 Document Reviewed: 11/17/2011 [facility] Patient  Information 2015 [place], [place]. This information is not intended to replace advice given to you by your health care provider. Make sure you discuss any questions you have with your health care provider.

## 2014-06-13 NOTE — Evaluation (Signed)
Physical Therapy Evaluation Patient Details Name: Briana [name] MRN: [id number] DOB: 05-07-1931 Today's Date: 06/13/2014   History of Present Illness  80 y.o. female s/p Left total knee arthroplasty. Hx of HTN, myosits and allergic arthritis.  Clinical Impression  Pt is s/p left TKA resulting in the deficits listed below (see PT Problem List). Rather lethargic this afternoon but willing to participate in transfer training. Min assist to stand and ambulate very short distance. Will continue to progress patient while admitted, focusing on increasing safety and independence with all functional mobility to allow discharge to the venue listed below.     Follow Up Recommendations SNF;Supervision for mobility/OOB    Equipment Recommendations  3in1 (PT)    Recommendations for Other Services       Precautions / Restrictions Precautions Precautions: Knee Precaution Comments: Reviewed knee precautions Required Braces or Orthoses: Knee Immobilizer - Left Restrictions Weight Bearing Restrictions: Yes LLE Weight Bearing: Weight bearing as tolerated      Mobility  Bed Mobility Overal bed mobility: Needs Assistance Bed Mobility: Supine to Sit     Supine to sit: Min assist     General bed mobility comments: Good trunk and UE strength. Min assist for LLE support to out of bed. VC for technique.  Transfers Overall transfer level: Needs assistance Equipment used: Rolling walker (2 wheeled) Transfers: Sit to/from Stand Sit to Stand: Min assist         General transfer comment: Min assist for boost from lowest bed setting. Took two attempts. VC for hand placement and and to increase use of RLE.  Ambulation/Gait Ambulation/Gait assistance: Min assist Ambulation Distance (Feet): 2 Feet Assistive device: Rolling walker (2 wheeled) Gait Pattern/deviations: Step-to pattern;Decreased step length - right;Decreased stance time - left;Antalgic   Gait velocity interpretation: Below normal  speed for age/gender General Gait Details: able to take several small steps before turning towards chair to sit. Educated on safe DME use with walker. VC for sequencing. Min assist for walker control. Tactile cues for left knee extension in stance phase. Mild instability but able to support herself using rolling walker.  Stairs            Wheelchair Mobility    Modified Rankin (Stroke Patients Only)       Balance Overall balance assessment: Needs assistance Sitting-balance support: No upper extremity supported;Feet supported Sitting balance-Leahy Scale: Good     Standing balance support: Single extremity supported Standing balance-Leahy Scale: Poor                               Pertinent Vitals/Pain Pain Assessment: 0-10 Pain Score:  ("hurts some") Pain Location: left knee Pain Intervention(s): Limited activity within patient's tolerance;Monitored during session;Repositioned    Home Living Family/patient expects to be discharged to:: Skilled nursing facility Living Arrangements: Children Available Help at Discharge: [place] Type of Home: House Home Access: Stairs to enter Entrance Stairs-Rails: Psychiatric nurse of Steps: 4 Home Layout: One level Home Equipment: Environmental consultant - 2 wheels;Tub bench;Grab bars - tub/shower      Prior Function Level of Independence: Independent with assistive device(s)         Comments: used walker intermittently. Independent with bath and dress     Hand Dominance   Dominant Hand: Right    Extremity/Trunk Assessment   Upper Extremity Assessment: Defer to OT evaluation           Lower Extremity Assessment: LLE deficits/detail  LLE Deficits / Details: decreased strength and ROM as expected post op     Communication   Communication: No difficulties  Cognition Arousal/Alertness: Lethargic;Suspect due to medications Behavior During Therapy: [facility] for tasks  assessed/performed Overall Cognitive Status: Within Functional Limits for tasks assessed                      General Comments General comments (skin integrity, edema, etc.): Pt son and other family memebers present during therapy session. Answered family's questions. Discussed d/c planning and role of PT in acute care.    Exercises Total Joint Exercises Ankle Circles/Pumps: AAROM;Both;10 reps;Seated Quad Sets: AROM;Left;10 reps;Seated      Assessment/Plan    PT Assessment Patient needs continued PT services  PT Diagnosis Difficulty walking;Abnormality of gait;Acute pain   PT Problem List Decreased strength;Decreased range of motion;Decreased activity tolerance;Decreased balance;Decreased mobility;Decreased knowledge of use of DME;Decreased knowledge of precautions;Impaired sensation;Pain  PT Treatment Interventions DME instruction;Gait training;Functional mobility training;Therapeutic activities;Therapeutic exercise;Balance training;Neuromuscular re-education;Patient/family education;Modalities   PT Goals (Current goals can be found in the Care Plan section) Acute Rehab PT Goals Patient Stated Goal: None stated PT Goal Formulation: With patient/family Time For Goal Achievement: 06/20/14 Potential to Achieve Goals: Good    Frequency 7X/week   Barriers to discharge        Co-evaluation               End of Session   Activity Tolerance: Patient tolerated treatment well Patient left: in chair;with call bell/phone within reach;with family/visitor present Nurse Communication: Mobility status         Time: 1440-1509 PT Time Calculation (min): 29 min   Charges:   PT Evaluation $Initial PT Evaluation Tier I: 1 Procedure PT Treatments $Therapeutic Activity: 8-22 mins   PT G Codes:        Elayne Snare, [place]   Ellouise Newer 06/13/2014, 3:30 PM

## 2014-06-13 NOTE — Anesthesia Procedure Notes (Signed)
Anesthesia Regional Block:  Femoral nerve block  Pre-Anesthetic Checklist: ,, timeout performed, Correct Patient, Correct Site, Correct Laterality, Correct Procedure, Correct Position, site marked, Risks and benefits discussed,  Surgical consent,  Pre-op evaluation,  At surgeon's request and post-op pain management  Laterality: Left  Prep: chloraprep and alcohol swabs       Needles:  Injection technique: Single-shot  Needle Type: Stimulator Needle - 80        Needle insertion depth: 5 cm   Additional Needles:  Procedures: nerve stimulator Femoral nerve block  Nerve Stimulator or Paresthesia:  Response: 0.5 mA, 0.1 ms, 5 cm  Additional Responses:   Narrative:  Start time: 06/13/2014 7:05 AM End time: 06/13/2014 7:10 AM Injection made incrementally with aspirations every 5 mL.  Performed by: Personally  Anesthesiologist: Sharolyn Douglas MD  Additional Notes: Pt accepts procedure w/ risks. 20cc 0.5% Marcaine w/ epi w/o difficulty or discomfort. GES

## 2014-06-14 ENCOUNTER — Encounter (HOSPITAL_COMMUNITY): Payer: Self-pay | Admitting: Orthopedic Surgery

## 2014-06-14 LAB — CBC
HEMATOCRIT: 29 % — AB (ref 36.0–46.0)
HEMOGLOBIN: 10.1 g/dL — AB (ref 12.0–15.0)
MCH: 32.8 pg (ref 26.0–34.0)
MCHC: 34.8 g/dL (ref 30.0–36.0)
MCV: 94.2 fL (ref 78.0–100.0)
Platelets: 146 10*3/uL — ABNORMAL LOW (ref 150–400)
RBC: 3.08 MIL/uL — ABNORMAL LOW (ref 3.87–5.11)
RDW: 15.1 % (ref 11.5–15.5)
WBC: 9.1 10*3/uL (ref 4.0–10.5)

## 2014-06-14 LAB — BASIC METABOLIC PANEL
Anion gap: 17 — ABNORMAL HIGH (ref 5–15)
BUN: 16 mg/dL (ref 6–23)
CALCIUM: 8.3 mg/dL — AB (ref 8.4–10.5)
CO2: 20 mEq/L (ref 19–32)
Chloride: 102 mEq/L (ref 96–112)
Creatinine, Ser: 0.78 mg/dL (ref 0.50–1.10)
GFR calc Af Amer: 89 mL/min — ABNORMAL LOW (ref >90)
GFR, EST NON AFRICAN AMERICAN: 77 mL/min — AB (ref >90)
GLUCOSE: 206 mg/dL — AB (ref 70–99)
Potassium: 4.5 mEq/L (ref 3.7–5.3)
Sodium: 139 mEq/L (ref 137–147)

## 2014-06-14 NOTE — Clinical Social Work Placement (Signed)
Clinical Social Work Department CLINICAL SOCIAL WORK PLACEMENT NOTE 06/14/2014  Patient:  Briana [name], Briana [name]  Account Number:  [id number] Admit date:  06/13/2014  Clinical Social Worker:  Charlene Brooke, LCSW  Date/time:  06/14/2014 10:54 AM  Clinical Social Work is seeking post-discharge placement for this patient at the following level of care:   SKILLED NURSING   (*CSW will update this form in Epic as items are completed)   06/14/2014  Patient/family provided with [place] Department of Clinical Social Work's list of facilities offering this level of care within the geographic area requested by the patient (or if unable, by the patient's family).  06/14/2014  Patient/family informed of their freedom to choose among providers that offer the needed level of care, that participate in Medicare, Medicaid or managed care program needed by the patient, have an available bed and are willing to accept the patient.  06/14/2014  Patient/family informed of [facility]' ownership interest in [facility], as well as of the fact that they are under no obligation to receive care at this facility.  PASARR submitted to EDS on  PASARR number received on   FL2 transmitted to all facilities in geographic area requested by pt/family on   FL2 transmitted to all facilities within larger geographic area on   Patient informed that his/her managed care company has contracts with or will negotiate with  certain facilities, including the following:     Patient/family informed of bed offers received:  06/14/2014 Patient chooses bed at [place] Physician recommends and patient chooses bed at    Patient to be transferred to  on   Patient to be transferred to facility by  Patient and family notified of transfer on  Name of family member notified:    The following physician request were entered in Epic:   Additional Comments:   Charlene Brooke, MSW Clinical Social Worker [phone]

## 2014-06-14 NOTE — Op Note (Signed)
NAMEJAENA, BROCATO NO.:  [id number]  MEDICAL RECORD NO.:  [id number]  LOCATION:  5N21C                        FACILITY:  [facility]  PHYSICIAN:  Ninetta Lights, M.D. DATE OF BIRTH:  06-02-1931  DATE OF PROCEDURE:  06/13/2014 DATE OF DISCHARGE:                              OPERATIVE REPORT   PREOPERATIVE DIAGNOSES:  Left knee end-stage degenerative arthritis, valgus alignment.  POSTOPERATIVE DIAGNOSES:  Left knee end-stage degenerative arthritis, valgus alignment with some bone loss, lateral femoral condyle.  PROCEDURE:  Left knee modified minimally invasive total knee replacement Stryker triathlon prosthesis.  Soft tissue balancing.  Cemented pegged cruciate retaining #3 femoral component.  Cemented #3 tibial component, 9 mm CS insert.  Cemented resurfacing 35-mm patellar component.  Soft tissue balancing.  SURGEON:  Ninetta Lights, M.D.  ASSISTANT:  Eula Listen, PA present throughout the entire case and necessary for timely completion of procedure.  ANESTHESIA:  General.  BLOOD LOSS:  Minimal.  SPECIMENS:  None.  CULTURES:  None.  COMPLICATIONS:  None.  DRESSINGS:  Soft compressive knee immobilizer.  DRAINS:  Hemovac x1.  TOURNIQUET TIME:  45 minutes.  PROCEDURE:  The patient was brought to the operating room, placed on the operating table in supine position.  After adequate anesthesia had been obtained, knee examined.  A 5-degree flexion contracture.  Excessive valgus more than 10 degrees partially correctable.  Tourniquet applied. Prepped and draped in usual sterile fashion.  Exsanguinated with elevation of Esmarch, tourniquet inflated to 350 mmHg.  Straight incision above the patella down to tibial tubercle.  Medial arthrotomy, vastus splitting, preserving quad tendon.  Medial capsule release. Grade 4 changes throughout.  Intramedullary guide, distal femur. Initially 8 and then eventually 10 mm distal resection.  Using epicondylar  axis, the femur was sized, cut, and fitted for a #3 cruciate retaining prosthesis.  Proximal tibial resection, extramedullary guide. Size #3 component.  Patella exposed.  Very worn almost concave.  Brought to a good bed, drilled, sized, and fitted for a 35 mm component.  With trials in place, I was very pleased with full extension, good balancing, good alignment, good patellar tracking.  This was with a 9-mm insert. Tibia was marked for rotation and reamed.  All trials were removed. Copious irrigation with a pulse irrigating device.  Cement prepared, placed on all components, firmly seated.  Polyethylene attached to tibia, knee reduced.  Patella held with a clamp.  Once cement hardened, the knee was irrigated again.  Soft tissues were injected with Exparel. Hemovac placed.  Arthrotomy closed with #1 Vicryl.  Skin and subcutaneous tissue with subcutaneous and subcuticular closure.  Margins were injected with Marcaine.  Sterile compressive dressing applied. Tourniquet was deflated and removed.  Knee immobilizer applied. Anesthesia reversed.  Brought to the recovery room.  Tolerated the surgery well.  No complications.     Ninetta Lights, M.D.     DFM/MEDQ  D:  06/13/2014  T:  06/14/2014  Job:  [id number]

## 2014-06-14 NOTE — Progress Notes (Signed)
Physical Therapy Treatment Patient Details Name: Briana [name] MRN: [id number] DOB: 03-18-1931 Today's Date: 06/14/2014    History of Present Illness 80 y.o. female s/p Left total knee arthroplasty. Hx of HTN, myosits and allergic arthritis.    PT Comments    Patient is progressing towards physical therapy goals, ambulating up to 50 feet with min assist for stability while using a rolling walker. Reviewed education and precautions following TKA. Motivated to improve function with therapy. Patient will continue to benefit from skilled physical therapy services to further improve independence with functional mobility.    Follow Up Recommendations  SNF;Supervision for mobility/OOB     Equipment Recommendations  3in1 (PT)    Recommendations for Other Services       Precautions / Restrictions Precautions Precautions: Knee Precaution Comments: Reviewed knee precautions Required Braces or Orthoses: Knee Immobilizer - Left Restrictions Weight Bearing Restrictions: Yes LLE Weight Bearing: Weight bearing as tolerated    Mobility  Bed Mobility Overal bed mobility: Needs Assistance Bed Mobility: Sit to Supine     Supine to sit: Supervision;HOB elevated Sit to supine: Min assist   General bed mobility comments: Min assist for LLE into bed. VC for technique.  Transfers Overall transfer level: Needs assistance Equipment used: Rolling walker (2 wheeled) Transfers: Sit to/from Stand Sit to Stand: Min assist Stand pivot transfers: Min assist       General transfer comment: Min assist for boost to stand from reclining chair. VC for hand placement and safe positioning prior to standing. Min guard for safety from [facility] with cues for hand and walker placement.  Ambulation/Gait Ambulation/Gait assistance: Min assist Ambulation Distance (Feet): 50 Feet Assistive device: Rolling walker (2 wheeled) Gait Pattern/deviations: Step-to pattern;Decreased step length - right;Decreased  stance time - left;Antalgic;Trunk flexed;Narrow base of support   Gait velocity interpretation: Below normal speed for age/gender General Gait Details: Min assist for stability. VC for sequencing of gait with rolling walker. VC for upright posture and forward gaze. Very slow and guarded gait, relies heavily on RW for UE support.   Stairs            Wheelchair Mobility    Modified Rankin (Stroke Patients Only)       Balance Overall balance assessment: Needs assistance Sitting-balance support: Feet supported;No upper extremity supported Sitting balance-Leahy Scale: Good     Standing balance support: Bilateral upper extremity supported;During functional activity Standing balance-Leahy Scale: Poor Standing balance comment: relies heavily on RW                    Cognition Arousal/Alertness: Awake/alert Behavior During Therapy: WFL for tasks assessed/performed Overall Cognitive Status: Within Functional Limits for tasks assessed                      Exercises Total Joint Exercises Goniometric ROM: 70 degrees left knee flexion in sitting position    General Comments General comments (skin integrity, edema, etc.): family presetn      Pertinent Vitals/Pain Pain Assessment: 0-10 Pain Score: 10-Worst pain ever Pain Location: Lt knee Pain Descriptors / Indicators: Aching Pain Intervention(s): Limited activity within patient's tolerance;Monitored during session;Premedicated before session;Repositioned (denies nurse to be notified at this time)    Home Living Family/patient expects to be discharged to:: Skilled nursing facility                    Prior Function Level of Independence: Independent with assistive device(s)      Comments:  cares for son with disability   PT Goals (current goals can now be found in the care plan section) Acute Rehab PT Goals Patient Stated Goal: None stated PT Goal Formulation: With patient/family Time For Goal  Achievement: 06/20/14 Potential to Achieve Goals: Good Progress towards PT goals: Progressing toward goals    Frequency  7X/week    PT Plan Current plan remains appropriate    Co-evaluation             End of Session   Activity Tolerance: Patient tolerated treatment well Patient left: with family/visitor present;in bed;in CPM     Time: 6286-3817 PT Time Calculation (min): 25 min  Charges:  $Gait Training: 8-22 mins $Therapeutic Activity: 8-22 mins                    G Codes:      [employer], [place]  Ellouise Newer 06/14/2014, 2:01 PM

## 2014-06-14 NOTE — Progress Notes (Signed)
Occupational Therapy Evaluation Patient Details Name: Briana [name] MRN: [id number] DOB: [birth year] Today's Date: 06/14/2014    History of Present Illness 80 y.o. female s/p Left total knee arthroplasty. Hx of HTN, myosits and allergic arthritis.   Clinical Impression   PTA, pt independent with ADL and cared for disabled son. Pt presents with decreased independence with mobility and ADL and will benefit from rehab at SNF to return to PLOF. Pt in agreement to SNF and is very motivated to return to being independent again. Pt was asking questions regarding her D/C plan to [facility] - deferred these questions to [place] aware.     Follow Up Recommendations  SNF;Supervision/Assistance - 24 hour    Equipment Recommendations  None recommended by OT    Recommendations for Other Services       Precautions / Restrictions Precautions Precautions: Knee Restrictions Weight Bearing Restrictions: Yes LLE Weight Bearing: Weight bearing as tolerated      Mobility Bed Mobility Overal bed mobility: Needs Assistance Bed Mobility: Supine to Sit     Supine to sit: Supervision;HOB elevated     General bed mobility comments: Pt encouraged to move her LLE independetly to help with pain control  Transfers Overall transfer level: Needs assistance Equipment used: Rolling walker (2 wheeled) Transfers: Sit to/from [facility] Sit to Stand: Min assist Stand pivot transfers: Min assist       General transfer comment: increased performance with trnasfers today as compared to yesterday    Balance Overall balance assessment: Needs assistance Sitting-balance support: Feet supported;No upper extremity supported Sitting balance-Leahy Scale: Good     Standing balance support: Bilateral upper extremity supported;During functional activity Standing balance-Leahy Scale: Poor Standing balance comment: relies heavily on RW                            ADL Overall  ADL's : Needs assistance/impaired     Grooming: Set up   Upper Body Bathing: Set up;Sitting   Lower Body Bathing: Moderate assistance;Sit to/from stand   Upper Body Dressing : Set up;Sitting   Lower Body Dressing: Moderate assistance;Sit to/from stand   Toilet Transfer: Minimal assistance;Stand-pivot;BSC   Toileting- Clothing Manipulation and Hygiene: Minimal assistance;Sit to/from stand       Functional mobility during ADLs: Minimal assistance;Rolling walker;Cueing for safety;Cueing for sequencing General ADL Comments: limited primarily by pain and LLE weakness     Vision                     Perception     Praxis      Pertinent Vitals/Pain Pain Assessment: 0-10 Pain Score: 8  Pain Location: L knee Pain Descriptors / Indicators: Aching Pain Intervention(s): Limited activity within patient's tolerance;Monitored during session;Repositioned;Ice applied (pain ranged from 0-8 depending on movement)     Hand Dominance Right   Extremity/Trunk Assessment Upper Extremity Assessment Upper Extremity Assessment: Overall WFL for tasks assessed   Lower Extremity Assessment Lower Extremity Assessment: Defer to PT evaluation   Cervical / Trunk Assessment Cervical / Trunk Assessment: Normal   Communication Communication Communication: No difficulties   Cognition Arousal/Alertness: Awake/alert Behavior During Therapy: WFL for tasks assessed/performed Overall Cognitive Status: Within Functional Limits for tasks assessed                     General Comments       Exercises       Shoulder Instructions  Home Living Family/patient expects to be discharged to:: Skilled nursing facility                                        Prior Functioning/Environment Level of Independence: Independent with assistive device(s)        Comments: cares for son with disability    OT Diagnosis: Generalized weakness;Acute pain   OT Problem List:  Decreased strength;Decreased range of motion;Decreased activity tolerance;Impaired balance (sitting and/or standing);Decreased safety awareness;Decreased knowledge of use of DME or AE;Decreased knowledge of precautions;Obesity;Pain   OT Treatment/Interventions:      OT Goals(Current goals can be found in the care plan section) Acute Rehab OT Goals Patient Stated Goal: None stated OT Goal Formulation:  (eval only)  OT Frequency:     Barriers to D/C:            Co-evaluation              End of Session Equipment Utilized During Treatment: Gait belt;Rolling walker CPM Left Knee CPM Left Knee: Off Nurse Communication: Mobility status;Other (comment) (encourage use of BSC)  Activity Tolerance: Patient tolerated treatment well Patient left: in chair;with call bell/phone within reach;with family/visitor present   Time: 6720-9470 OT Time Calculation (min): 27 min Charges:  OT General Charges $OT Visit: 1 Procedure OT Evaluation $Initial OT Evaluation Tier I: 1 Procedure OT Treatments $Self Care/Home Management : 8-22 mins G-Codes:    Salvadore Valvano,HILLARY 2014-07-08, 11:45 AM   Maurie Boettcher, OTR/L  [phone] 2014-07-08

## 2014-06-14 NOTE — Progress Notes (Signed)
SW received authorization from [facility] # [id number].  Charlene Brooke, MSW Clinical Social Worker [phone]

## 2014-06-14 NOTE — Care Management Note (Signed)
CARE MANAGEMENT NOTE 06/14/2014  Patient:  Briana [name], Briana [name]   Account Number:  [id number]  Date Initiated:  06/14/2014  Documentation initiated by:  Ricki Miller  Subjective/Objective Assessment:   80 yr old female admitted with DJD of left knee, s/p left total knee arthroplasty.     Action/Plan:   Patient is for shortterm rehab at SNF, will go to [facility] today. Social worker is aware.   Anticipated DC Date:  06/14/2014   Anticipated DC Plan:  SKILLED NURSING FACILITY  In-house referral  Clinical Social Worker      DC Planning Services  CM consult      [facility] Choice  NA   Choice offered to / List presented to:     DME arranged  NA        [place] arranged  NA      Status of service:  Completed, signed off Medicare Important Message given?  NA - LOS <3 / Initial given by admissions (If response is "NO", the following Medicare IM given date fields will be blank) Date Medicare IM given:   Medicare IM given by:   Date Additional Medicare IM given:   Additional Medicare IM given by:    Discharge Disposition:  [place]  Per UR Regulation:  Reviewed for med. necessity/level of care/duration of stay

## 2014-06-14 NOTE — Progress Notes (Signed)
Subjective: 1 Day Post-Op Procedure(s) (LRB): TOTAL KNEE ARTHROPLASTY (Left) Patient reports pain as 2 on 0-10 scale.  Patient with moderate amount of nausea/vomiting yesterday.  Switched pain meds and antiemetic with relief of symptoms.  No nausea/vomiting reported this am.  No lightheadedness/dizziness.  Positive flatus but no bm.  Tolerating diet.  Objective: Vital signs in last 24 hours: Temp:  [97.3 F (36.3 C)-98 F (36.7 C)] 98 F (36.7 C) (09/03 0600) Pulse Rate:  [54-71] 71 (09/03 0600) Resp:  [13-19] 16 (09/03 0600) BP: (131-156)/(50-65) 137/50 mmHg (09/03 0600) SpO2:  [96 %-100 %] 100 % (09/03 0600)  Intake/Output from previous day: 09/02 0701 - 09/03 0700 In: 2520 [P.O.:120; I.V.:2400] Out: 1600 [Urine:900; Drains:650; Blood:50] Intake/Output this shift:     Recent Labs  06/13/14 1300  HGB 11.5*    Recent Labs  06/13/14 1300  WBC 12.3*  RBC 3.60*  HCT 33.6*  PLT 182    Recent Labs  06/13/14 1300  CREATININE 0.76   No results found for this basename: LABPT, INR,  in the last 72 hours  Neurologically intact Neurovascular intact Sensation intact distally Intact pulses distally Dorsiflexion/Plantar flexion intact Compartment soft Negative homans bilaterally hemovac drain pulled by me today  Assessment/Plan: 1 Day Post-Op Procedure(s) (LRB): TOTAL KNEE ARTHROPLASTY (Left) Advance diet Up with therapy D/C IV fluids Discharge to SNF on Saturday wbat LLE ABLA-asymptomatic but will continue to follow  [place], M. LINDSEY 06/14/2014, 7:51 AM

## 2014-06-15 ENCOUNTER — Other Ambulatory Visit: Payer: Self-pay | Admitting: *Deleted

## 2014-06-15 ENCOUNTER — Non-Acute Institutional Stay (SKILLED_NURSING_FACILITY): Payer: Commercial Managed Care - HMO | Admitting: Adult Health

## 2014-06-15 DIAGNOSIS — K5900 Constipation, unspecified: Secondary | ICD-10-CM

## 2014-06-15 DIAGNOSIS — M1710 Unilateral primary osteoarthritis, unspecified knee: Secondary | ICD-10-CM

## 2014-06-15 DIAGNOSIS — M1712 Unilateral primary osteoarthritis, left knee: Secondary | ICD-10-CM

## 2014-06-15 DIAGNOSIS — J309 Allergic rhinitis, unspecified: Secondary | ICD-10-CM

## 2014-06-15 DIAGNOSIS — Z96659 Presence of unspecified artificial knee joint: Secondary | ICD-10-CM

## 2014-06-15 DIAGNOSIS — J302 Other seasonal allergic rhinitis: Secondary | ICD-10-CM

## 2014-06-15 DIAGNOSIS — M069 Rheumatoid arthritis, unspecified: Secondary | ICD-10-CM

## 2014-06-15 DIAGNOSIS — E785 Hyperlipidemia, unspecified: Secondary | ICD-10-CM

## 2014-06-15 DIAGNOSIS — Z96652 Presence of left artificial knee joint: Secondary | ICD-10-CM

## 2014-06-15 DIAGNOSIS — I10 Essential (primary) hypertension: Secondary | ICD-10-CM

## 2014-06-15 MED ORDER — HYDROCODONE-ACETAMINOPHEN 5-325 MG PO TABS: ORAL_TABLET | ORAL | Status: DC

## 2014-06-15 NOTE — Progress Notes (Signed)
Patient for discharge to SNF bed at [facility] today. Plans confirmed with patient and family who are in agreement. SNF is prepared for patient and we will  plan transfer via  PTAR.   Charlene Brooke, MSW Clinical Social Worker [phone]

## 2014-06-15 NOTE — Telephone Encounter (Signed)
[facility] 

## 2014-06-19 ENCOUNTER — Non-Acute Institutional Stay (SKILLED_NURSING_FACILITY): Payer: Commercial Managed Care - HMO | Admitting: Adult Health

## 2014-06-19 ENCOUNTER — Encounter: Payer: Self-pay | Admitting: Adult Health

## 2014-06-19 DIAGNOSIS — M1710 Unilateral primary osteoarthritis, unspecified knee: Secondary | ICD-10-CM

## 2014-06-19 DIAGNOSIS — T402X5A Adverse effect of other opioids, initial encounter: Secondary | ICD-10-CM | POA: Insufficient documentation

## 2014-06-19 DIAGNOSIS — K5903 Drug induced constipation: Secondary | ICD-10-CM | POA: Insufficient documentation

## 2014-06-19 DIAGNOSIS — Z96652 Presence of left artificial knee joint: Secondary | ICD-10-CM | POA: Insufficient documentation

## 2014-06-19 DIAGNOSIS — I10 Essential (primary) hypertension: Secondary | ICD-10-CM | POA: Insufficient documentation

## 2014-06-19 DIAGNOSIS — Z96659 Presence of unspecified artificial knee joint: Secondary | ICD-10-CM

## 2014-06-19 DIAGNOSIS — M069 Rheumatoid arthritis, unspecified: Secondary | ICD-10-CM | POA: Insufficient documentation

## 2014-06-19 DIAGNOSIS — M1712 Unilateral primary osteoarthritis, left knee: Secondary | ICD-10-CM

## 2014-06-19 MED ORDER — DOCUSATE SODIUM 100 MG PO CAPS: 100.0000 mg | ORAL_CAPSULE | Freq: Every day | ORAL | Status: DC

## 2014-06-19 MED ORDER — HYDROCODONE-ACETAMINOPHEN 5-325 MG PO TABS: 1.0000 | ORAL_TABLET | Freq: Four times a day (QID) | ORAL | Status: DC

## 2014-06-19 NOTE — Progress Notes (Signed)
Patient ID: Briana [name], female   DOB: 27-Mar-1931, 80 y.o.   MRN: [id number]     [facility]  Allergies  Allergen Reactions  . Ibuprofen Anaphylaxis  . Diltiazem Hcl     REACTION: ANGIO EDEMA  . Hydrochlorothiazide     REACTION: SWELLING  . Lisinopril     REACTION: SWELLING  . Oxycodone Nausea And Vomiting  . Verapamil     REACTION: SWELLING     Chief Complaint  Patient presents with  . Hospitalization Follow-up    HPI:  She has been hospitalized for a left knee replacement. She is here for short term rehab with her goal to return home as soon as she is able to do so. She is having left knee pain. We did discuss her medication regimen she is on routine robaxin; and she would like her vicodin to be scheduled as well with an option for breakthrough medications.     Past Medical History  Diagnosis Date  . ACUTE URIS OF UNSPECIFIED SITE 11/26/2008  . ALLERGIC RHINITIS 10/28/2007  . ARTHRITIS, KNEES, BILATERAL 10/28/2007  . BACK PAIN 04/12/2009  . BUNIONECTOMY, HX OF 10/28/2007  . CELLULITIS&ABSCESS OF HAND EXCEPT FINGERS&THUMB 10/23/2010  . HYPERLIPIDEMIA 10/28/2007  . HYPERTENSION 10/28/2007  . INSOMNIA UNSPECIFIED 10/31/2007  . MYOSITIS 10/22/2010  . NECK PAIN 12/03/2008  . Pain in joint, site unspecified 10/07/2010  . WRIST PAIN, LEFT 10/22/2010  . ALLERGIC ARTHRITIS OTHER SPECIFIED SITES 10/22/2010    OA + RA  . Complication of anesthesia     itching and makes me crazy  . Family history of anesthesia complication     DAUGHTER HAD HIVES AND " WENT WILD "    Past Surgical History  Procedure Laterality Date  . Arthroscopic surgery and rotator cuff repair left shoulder    . Arthroscopy right knee hx of surgery    . Hand surgery for broken finger, remote    . Bunionectomy      hx of  . Foot surgery      RIGHT  . Total knee arthroplasty  07/01/2012    Procedure: TOTAL KNEE ARTHROPLASTY;  Surgeon: Yvette Rack., MD;  Location: [place];  Service: Orthopedics;   Laterality: Right;  right total knee arthroplasty  . Joint replacement Right     knee  . Total knee arthroplasty Left 06/13/2014    dr Percell Miller  . Total knee arthroplasty Left 06/13/2014    Procedure: TOTAL KNEE ARTHROPLASTY;  Surgeon: Ninetta Lights, MD;  Location: [place];  Service: Orthopedics;  Laterality: Left;    VITAL SIGNS BP 148/80  Pulse 70  Ht 5\' 4"  (1.626 m)  Wt 179 lb (81.194 kg)  BMI 30.71 kg/m2   Patient's Medications  New Prescriptions   No medications on file  Previous Medications   AMLODIPINE (NORVASC) 10 MG TABLET    Take 1 tablet (10 mg total) by mouth daily.   ATENOLOL (TENORMIN) 100 MG TABLET    Take 1 tablet (100 mg total) by mouth daily.   BISACODYL (DULCOLAX) 5 MG EC TABLET    Take 1 tablet (5 mg total) by mouth daily as needed for moderate constipation.   CHOLECALCIFEROL (VITAMIN D) 2000 UNITS TABLET    Take 2,000 Units by mouth daily.   ENOXAPARIN (LOVENOX) 30 MG/0.3ML INJECTION    Inject 0.3 mLs (30 mg total) into the skin every 12 (twelve) hours.   FEXOFENADINE (ALLEGRA) 180 MG TABLET    Take 180 mg by  mouth daily.   FOLIC ACID (FOLVITE) 1 MG TABLET    Take 1 mg by mouth daily.   HYDROCODONE-ACETAMINOPHEN (NORCO) 5-325 MG PER TABLET    Take one tablet by mouth every six hours for pain; Take one tablet by mouth every 3 hours as needed for pain   METHOCARBAMOL (ROBAXIN) 500 MG TABLET    Take 1 tablet (500 mg total) by mouth 4 (four) times daily.   METHOTREXATE (RHEUMATREX) 2.5 MG TABLET    Take 20 mg by mouth once a week. Caution:Chemotherapy. Protect from light.  Taken on Fridays.   ONDANSETRON (ZOFRAN) 4 MG TABLET    Take 1 tablet (4 mg total) by mouth every 8 (eight) hours as needed for nausea or vomiting.   SIMVASTATIN (ZOCOR) 40 MG TABLET    Take 1 tablet (40 mg total) by mouth every evening.   TEMAZEPAM (RESTORIL) 15 MG CAPSULE    Take 1 capsule (15 mg total) by mouth at bedtime as needed for sleep.  Modified Medications   No medications on file    Discontinued Medications   No medications on file    SIGNIFICANT DIAGNOSTIC EXAMS  06-05-14: chest x-ray: No active cardiopulmonary disease.  06-13-14: left knee x-ray: Left knee prosthesis in anatomic alignment.    LABS REVIEWED:   04-19-14: tsh 1.28; hgb a1c 6.1; chol 184; ldl 38; trig 181 06-05-14: wbc 9.7; hgb 12.8; hct 37.5; mcv 94.7; plt 214; glucose 115; bun 17; creat 0.71; k+4.1; na++141; liver normal albumin 3.9 06-14-14: wbc 9.1; hgb 10.1; hct 29.0; mcv 94.2; lt 146; glucose 206; bun 16; creat 0.78; k+4.5; na++139      Review of Systems  Constitutional: Negative for malaise/fatigue.  Respiratory: Negative for cough and shortness of breath.   Cardiovascular: Negative for chest pain, palpitations and leg swelling.  Gastrointestinal: Positive for constipation. Negative for heartburn and abdominal pain.  Musculoskeletal: Positive for joint pain. Negative for myalgias.       Left knee replacement  Skin: Negative.   Neurological: Negative for headaches.  Psychiatric/Behavioral: Negative for depression. The patient is not nervous/anxious.      Physical Exam  Constitutional: She is oriented to person, place, and time. She appears well-developed and well-nourished. No distress.  overweight  Neck: Neck supple. No JVD present. No thyromegaly present.  Cardiovascular: Normal rate, regular rhythm and intact distal pulses.   Respiratory: Effort normal and breath sounds normal. No respiratory distress. She has no wheezes.  GI: Soft. Bowel sounds are normal. She exhibits no distension. There is no tenderness.  Musculoskeletal: She exhibits no edema.  Is able to move all extremities; is status post left knee replacement   Neurological: She is alert and oriented to person, place, and time.  Skin: Skin is warm and dry. She is not diaphoretic.  Left knee incision line without signs of infection present      ASSESSMENT/ PLAN:  1. Osteoarthritis status post left knee replacement:  will continue therapy as directed and will follow up with orthopedic as indicted. Will continue robaxin 500 mg four times daily. Will change her vicodin 5/325 mg every 6 hours and every 3 hours as needed; lovenox 30 mg twice daily for 8 doses will monitor   2. Hypertension: is stable will continue norvasc 10 mg daily and tenormin 100 mg daily;   3. Dyslipidemia: will continue zocor 40 mg daily  4. RA: is stable will continue methotrexate 20 mg weekly and folic acid daily   5. Allergic rhinitis: will continue  allegra 180 mg daily   6. Constipation: will begin colace daily    Time spent with patient 50 minutes.

## 2014-06-20 ENCOUNTER — Encounter: Payer: Self-pay | Admitting: Adult Health

## 2014-06-20 MED ORDER — OXYCODONE HCL 5 MG PO TABA: 5.0000 mg | ORAL_TABLET | Freq: Four times a day (QID) | ORAL | Status: DC

## 2014-06-20 MED ORDER — PROMETHAZINE HCL 12.5 MG PO TABS: 12.5000 mg | ORAL_TABLET | Freq: Four times a day (QID) | ORAL | Status: DC

## 2014-06-20 NOTE — Progress Notes (Signed)
Patient ID: Briana [name], female   DOB: October 13, 1930, 80 y.o.   MRN: [id number]     [facility]  Allergies  Allergen Reactions  . Ibuprofen Anaphylaxis  . Diltiazem Hcl     REACTION: ANGIO EDEMA  . Hydrochlorothiazide     REACTION: SWELLING  . Lisinopril     REACTION: SWELLING  . Verapamil     REACTION: SWELLING     Chief Complaint  Patient presents with  . Acute Visit    pain management     HPI:  She is status post left knee replacement. Her current pain medication regimen is ineffective in managing her pain. She would like to take oxycodone. When informed that oxycodone is on her allergy list;she told me that this medication in the past has made her "sick" on her stomach and she has tolerated in the past with phenergan. She states she has responded well to this medication.    Past Medical History  Diagnosis Date  . ACUTE URIS OF UNSPECIFIED SITE 11/26/2008  . ALLERGIC RHINITIS 10/28/2007  . ARTHRITIS, KNEES, BILATERAL 10/28/2007  . BACK PAIN 04/12/2009  . BUNIONECTOMY, HX OF 10/28/2007  . CELLULITIS&ABSCESS OF HAND EXCEPT FINGERS&THUMB 10/23/2010  . HYPERLIPIDEMIA 10/28/2007  . HYPERTENSION 10/28/2007  . INSOMNIA UNSPECIFIED 10/31/2007  . MYOSITIS 10/22/2010  . NECK PAIN 12/03/2008  . Pain in joint, site unspecified 10/07/2010  . WRIST PAIN, LEFT 10/22/2010  . ALLERGIC ARTHRITIS OTHER SPECIFIED SITES 10/22/2010    OA + RA  . Complication of anesthesia     itching and makes me crazy  . Family history of anesthesia complication     DAUGHTER HAD HIVES AND " WENT WILD "    Past Surgical History  Procedure Laterality Date  . Arthroscopic surgery and rotator cuff repair left shoulder    . Arthroscopy right knee hx of surgery    . Hand surgery for broken finger, remote    . Bunionectomy      hx of  . Foot surgery      RIGHT  . Total knee arthroplasty  07/01/2012    Procedure: TOTAL KNEE ARTHROPLASTY;  Surgeon: Yvette Rack., MD;  Location: [place];  Service: Orthopedics;   Laterality: Right;  right total knee arthroplasty  . Joint replacement Right     knee  . Total knee arthroplasty Left 06/13/2014    dr Percell Miller  . Total knee arthroplasty Left 06/13/2014    Procedure: TOTAL KNEE ARTHROPLASTY;  Surgeon: Ninetta Lights, MD;  Location: [place];  Service: Orthopedics;  Laterality: Left;    VITAL SIGNS BP 143/66  Pulse 77  Ht 5\' 4"  (1.626 m)  Wt 179 lb (81.194 kg)  BMI 30.71 kg/m2   Patient's Medications  New Prescriptions   No medications on file  Previous Medications   AMLODIPINE (NORVASC) 10 MG TABLET    Take 1 tablet (10 mg total) by mouth daily.   ATENOLOL (TENORMIN) 100 MG TABLET    Take 1 tablet (100 mg total) by mouth daily.   BISACODYL (DULCOLAX) 5 MG EC TABLET    Take 1 tablet (5 mg total) by mouth daily as needed for moderate constipation.   CHOLECALCIFEROL (VITAMIN D) 2000 UNITS TABLET    Take 2,000 Units by mouth daily.   DOCUSATE SODIUM (COLACE) 100 MG CAPSULE    Take 1 capsule (100 mg total) by mouth daily.   ENOXAPARIN (LOVENOX) 30 MG/0.3ML INJECTION    Inject 0.3 mLs (30 mg total) into  the skin every 12 (twelve) hours.   FEXOFENADINE (ALLEGRA) 180 MG TABLET    Take 180 mg by mouth daily.   FOLIC ACID (FOLVITE) 1 MG TABLET    Take 1 mg by mouth daily.   HYDROCODONE-ACETAMINOPHEN (NORCO/VICODIN) 5-325 MG PER TABLET    Take 1 tablet by mouth every 6 (six) hours. And every 3 hours as needed   METHOCARBAMOL (ROBAXIN) 500 MG TABLET    Take 1 tablet (500 mg total) by mouth 4 (four) times daily.   METHOTREXATE (RHEUMATREX) 2.5 MG TABLET    Take 20 mg by mouth once a week. Caution:Chemotherapy. Protect from light.  Taken on Fridays.   ONDANSETRON (ZOFRAN) 4 MG TABLET    Take 1 tablet (4 mg total) by mouth every 8 (eight) hours as needed for nausea or vomiting.   SIMVASTATIN (ZOCOR) 40 MG TABLET    Take 1 tablet (40 mg total) by mouth every evening.   TEMAZEPAM (RESTORIL) 15 MG CAPSULE    Take 1 capsule (15 mg total) by mouth at bedtime as needed for  sleep.  Modified Medications   No medications on file  Discontinued Medications   No medications on file    SIGNIFICANT DIAGNOSTIC EXAMS   06-05-14: chest x-ray: No active cardiopulmonary disease.  06-13-14: left knee x-ray: Left knee prosthesis in anatomic alignment.    LABS REVIEWED:   04-19-14: tsh 1.28; hgb a1c 6.1; chol 184; ldl 38; trig 181 06-05-14: wbc 9.7; hgb 12.8; hct 37.5; mcv 94.7; plt 214; glucose 115; bun 17; creat 0.71; k+4.1; na++141; liver normal albumin 3.9 06-14-14: wbc 9.1; hgb 10.1; hct 29.0; mcv 94.2; lt 146; glucose 206; bun 16; creat 0.78; k+4.5; na++139      Review of Systems  Constitutional: Negative for malaise/fatigue.  Respiratory: Negative for cough and shortness of breath.   Cardiovascular: Negative for chest pain, palpitations and leg swelling.  Gastrointestinal: Positive for constipation. Negative for heartburn and abdominal pain.  Musculoskeletal: Positive for joint pain. Negative for myalgias.       Left knee replacement  Skin: Negative.   Neurological: Negative for headaches.  Psychiatric/Behavioral: Negative for depression. The patient is not nervous/anxious.      Physical Exam  Constitutional: She is oriented to person, place, and time. She appears well-developed and well-nourished. No distress.  overweight  Neck: Neck supple. No JVD present. No thyromegaly present.  Cardiovascular: Normal rate, regular rhythm and intact distal pulses.   Respiratory: Effort normal and breath sounds normal. No respiratory distress. She has no wheezes.  GI: Soft. Bowel sounds are normal. She exhibits no distension. There is no tenderness.  Musculoskeletal: She exhibits no edema.  Is able to move all extremities; is status post left knee replacement   Neurological: She is alert and oriented to person, place, and time.  Skin: Skin is warm and dry. She is not diaphoretic.  Left knee incision line without signs of infection present      ASSESSMENT/  PLAN:  1. Osteoarthritis status post left knee replacement: will continue therapy as directed and will follow up with orthopedic as indicted. Will continue robaxin 500 mg four times daily. Will stop the vicodin as this medication is ineffective. I have removed her oxycodone allergy. Will begin oxycodone 5 mg every 6 hours routinely and every 3 hours as needed for breakthrough pain. Will being phenergan 12.5 mg every 6 hours for nausea and will monitor her status.

## 2014-06-21 ENCOUNTER — Encounter: Payer: Self-pay | Admitting: Internal Medicine

## 2014-06-21 ENCOUNTER — Other Ambulatory Visit: Payer: Self-pay | Admitting: *Deleted

## 2014-06-21 ENCOUNTER — Non-Acute Institutional Stay (SKILLED_NURSING_FACILITY): Payer: Commercial Managed Care - HMO | Admitting: Internal Medicine

## 2014-06-21 DIAGNOSIS — Z96659 Presence of unspecified artificial knee joint: Secondary | ICD-10-CM

## 2014-06-21 DIAGNOSIS — M25562 Pain in left knee: Secondary | ICD-10-CM

## 2014-06-21 DIAGNOSIS — M1712 Unilateral primary osteoarthritis, left knee: Secondary | ICD-10-CM

## 2014-06-21 DIAGNOSIS — Z96652 Presence of left artificial knee joint: Secondary | ICD-10-CM

## 2014-06-21 DIAGNOSIS — I10 Essential (primary) hypertension: Secondary | ICD-10-CM

## 2014-06-21 DIAGNOSIS — K5900 Constipation, unspecified: Secondary | ICD-10-CM

## 2014-06-21 DIAGNOSIS — D62 Acute posthemorrhagic anemia: Secondary | ICD-10-CM

## 2014-06-21 DIAGNOSIS — M1710 Unilateral primary osteoarthritis, unspecified knee: Secondary | ICD-10-CM

## 2014-06-21 DIAGNOSIS — M069 Rheumatoid arthritis, unspecified: Secondary | ICD-10-CM

## 2014-06-21 DIAGNOSIS — M25569 Pain in unspecified knee: Secondary | ICD-10-CM

## 2014-06-21 MED ORDER — OXYCODONE HCL 10 MG PO TABS: ORAL_TABLET | ORAL | Status: DC

## 2014-06-21 MED ORDER — OXYCODONE-ACETAMINOPHEN 5-325 MG PO TABS: ORAL_TABLET | ORAL | Status: DC

## 2014-06-21 NOTE — Progress Notes (Signed)
Patient ID: Briana [name], female   DOB: 01-10-1931, 80 y.o.   MRN: [id number]     Facility: [facility]    PCP: Scarlette Calico, MD  Code Status: full code  Allergies  Allergen Reactions  . Ibuprofen Anaphylaxis  . Diltiazem Hcl     REACTION: ANGIO EDEMA  . Hydrochlorothiazide     REACTION: SWELLING  . Lisinopril     REACTION: SWELLING  . Verapamil     REACTION: SWELLING    Chief Complaint: new admission  HPI:  80 y/o female patient is here for STR after hospital admission from 06/13/14-06/14/14 with severe degenerative OA in left knee and undergoing left knee Arthroplasty. She is seen in her room today. Her pain is not under control, she mentions having intermittent sharp discomfort in and around the surgical site. Has regular bowel movement. The pain is preventing her from working with therapy. No other concerns She has hx of HTN, Constipation, RA, hyperlipidemia  Review of Systems:  Constitutional: Negative for fever, chills, diaphoresis.  HENT: Negative for congestion, hearing loss and sore throat.   Eyes: Negative for eye pain, blurred vision, double vision and discharge.  Respiratory: Negative for cough, sputum production, shortness of breath and wheezing.   Cardiovascular: Negative for chest pain, palpitations, orthopnea. Has noticed some leg swelling.  Gastrointestinal: Negative for heartburn, nausea, vomiting, abdominal pain, diarrhea.  Genitourinary: Negative for dysuria, urgency, frequency, hematuria and flank pain.  Musculoskeletal: Negative for back pain, falls Skin: Negative for itching and rash.  Neurological: Negative for dizziness, tingling, focal weakness and headaches.  Psychiatric/Behavioral: Negative for depression.     Past Medical History  Diagnosis Date  . ACUTE URIS OF UNSPECIFIED SITE 11/26/2008  . ALLERGIC RHINITIS 10/28/2007  . ARTHRITIS, KNEES, BILATERAL 10/28/2007  . BACK PAIN 04/12/2009  . BUNIONECTOMY, HX OF 10/28/2007   . CELLULITIS&ABSCESS OF HAND EXCEPT FINGERS&THUMB 10/23/2010  . HYPERLIPIDEMIA 10/28/2007  . HYPERTENSION 10/28/2007  . INSOMNIA UNSPECIFIED 10/31/2007  . MYOSITIS 10/22/2010  . NECK PAIN 12/03/2008  . Pain in joint, site unspecified 10/07/2010  . WRIST PAIN, LEFT 10/22/2010  . ALLERGIC ARTHRITIS OTHER SPECIFIED SITES 10/22/2010    OA + RA  . Complication of anesthesia     itching and makes me crazy  . Family history of anesthesia complication     DAUGHTER HAD HIVES AND " WENT WILD "   Past Surgical History  Procedure Laterality Date  . Arthroscopic surgery and rotator cuff repair left shoulder    . Arthroscopy right knee hx of surgery    . Hand surgery for broken finger, remote    . Bunionectomy      hx of  . Foot surgery      RIGHT  . Total knee arthroplasty  07/01/2012    Procedure: TOTAL KNEE ARTHROPLASTY;  Surgeon: Yvette Rack., MD;  Location: [place];  Service: Orthopedics;  Laterality: Right;  right total knee arthroplasty  . Joint replacement Right     knee  . Total knee arthroplasty Left 06/13/2014    dr Percell Miller  . Total knee arthroplasty Left 06/13/2014    Procedure: TOTAL KNEE ARTHROPLASTY;  Surgeon: Ninetta Lights, MD;  Location: [place];  Service: Orthopedics;  Laterality: Left;   Social History:   reports that she has never smoked. She has never used smokeless tobacco. She reports that she does not drink alcohol or use illicit drugs.  Family History  Problem Relation Age of Onset  .  Cancer Brother     lung cancer  . Coronary artery disease Other   . Heart attack Other     Medications: Patient's Medications  New Prescriptions   No medications on file  Previous Medications   AMLODIPINE (NORVASC) 10 MG TABLET    Take 1 tablet (10 mg total) by mouth daily.   ATENOLOL (TENORMIN) 100 MG TABLET    Take 1 tablet (100 mg total) by mouth daily.   BISACODYL (DULCOLAX) 5 MG EC TABLET    Take 1 tablet (5 mg total) by mouth daily as needed for moderate constipation.    CHOLECALCIFEROL (VITAMIN D) 2000 UNITS TABLET    Take 2,000 Units by mouth daily.   DOCUSATE SODIUM (COLACE) 100 MG CAPSULE    Take 1 capsule (100 mg total) by mouth daily.   ENOXAPARIN (LOVENOX) 30 MG/0.3ML INJECTION    Inject 0.3 mLs (30 mg total) into the skin every 12 (twelve) hours.   FEXOFENADINE (ALLEGRA) 180 MG TABLET    Take 180 mg by mouth daily.   FOLIC ACID (FOLVITE) 1 MG TABLET    Take 1 mg by mouth daily.   METHOCARBAMOL (ROBAXIN) 500 MG TABLET    Take 1 tablet (500 mg total) by mouth 4 (four) times daily.   METHOTREXATE (RHEUMATREX) 2.5 MG TABLET    Take 20 mg by mouth once a week. Caution:Chemotherapy. Protect from light.  Taken on Fridays.   OXYCODONE HCL, ABUSE DETER, 5 MG TABA    Take 5 mg by mouth every 6 (six) hours. And every 3 hours as needed   PROMETHAZINE (PHENERGAN) 12.5 MG TABLET    Take 1 tablet (12.5 mg total) by mouth every 6 (six) hours.   SIMVASTATIN (ZOCOR) 40 MG TABLET    Take 1 tablet (40 mg total) by mouth every evening.   TEMAZEPAM (RESTORIL) 15 MG CAPSULE    Take 1 capsule (15 mg total) by mouth at bedtime as needed for sleep.  Modified Medications   No medications on file  Discontinued Medications   No medications on file     Physical Exam: Filed Vitals:   06/21/14 1327  BP: 122/60  Pulse: 64  Temp: 97.3 F (36.3 C)  Resp: 16  SpO2: 95%    General- elderly female in no acute distress Head- atraumatic, normocephalic Eyes- PERRLA, no pallor, no icterus, no discharge Neck- no lymphadenopathy Cardiovascular- normal s1,s2, no murmurs Respiratory- bilateral clear to auscultation, no wheeze, no rhonchi, no crackles, no use of accessory muscles Abdomen- bowel sounds present, soft, non tender Musculoskeletal- able to move all 4 extremities, limited movement in left leg at the knee joint, trace left leg edema Neurological- no focal deficit Skin- warm and dry, incision site dressing in place and clean Psychiatry- alert and oriented     Labs  reviewed: Basic Metabolic Panel:  Recent Labs  04/19/14 1434 06/05/14 1040 06/13/14 1300 06/14/14 0700  NA 137 141  --  139  K 3.7 4.1  --  4.5  CL 103 103  --  102  CO2 26 24  --  20  GLUCOSE 113* 115*  --  206*  BUN 17 17  --  16  CREATININE 0.7 0.71 0.76 0.78  CALCIUM 9.6 9.4  --  8.3*   Liver Function Tests:  Recent Labs  04/19/14 1434 06/05/14 1040  AST 21 20  ALT 16 20  ALKPHOS 93 104  BILITOT 0.5 0.5  PROT 7.6 7.5  ALBUMIN 4.1 3.9   No  results found for this basename: LIPASE, AMYLASE,  in the last 8760 hours No results found for this basename: AMMONIA,  in the last 8760 hours CBC:  Recent Labs  04/19/14 1434 06/05/14 1040 06/13/14 1300 06/14/14 0700  WBC 8.0 9.7 12.3* 9.1  NEUTROABS 5.6 7.2  --   --   HGB 12.3 12.8 11.5* 10.1*  HCT 36.5 37.5 33.6* 29.0*  MCV 95.9 94.7 93.3 94.2  PLT 231.0 214 182 146*   Cardiac Enzymes:  Recent Labs  04/19/14 1434  CKTOTAL 155    Radiological Exams: Dg Chest 2 View  06/05/2014   CLINICAL DATA:  Preop total knee replacement.  Hypertension.  EXAM: CHEST  2 VIEW  COMPARISON:  11/11/2012  FINDINGS: The heart size and mediastinal contours are within normal limits. Both lungs are clear. The visualized skeletal structures are unremarkable.  IMPRESSION: No active cardiopulmonary disease.   Electronically Signed   By: Rolm Baptise M.D.   On: 06/05/2014 13:03   Dg Knee Left Port  06/13/2014   CLINICAL DATA:  Postop  EXAM: PORTABLE LEFT KNEE - 1-2 VIEW  COMPARISON:  01/20/2007  FINDINGS: Two views of the left knee submitted. There is left knee prosthesis in anatomic alignment. Postsurgical changes with surgical drain in place and small amount of periarticular soft tissue air.  IMPRESSION: Left knee prosthesis in anatomic alignment.   Electronically Signed   By: Lahoma Crocker M.D.   On: 06/13/2014 11:10    Assessment/Plan  OA S/p left knee arthroplasty. Will have her work with physical therapy and occupational therapy team to  help with gait training and muscle strengthening exercises.fall precautions. Skin care. Encourage to be out of bed. Continue lovenox 30 mg bid for dvt prophylaxis. Continue robaxin 500 mg qid  Left knee pain Post recent surgery. On oxycodone 5mg  q6h and q 3h prn. Change to oxycodone 10 mg tid and give oxycodone-apap 5-325 q4h prn pain. Continue her scheduled muscle relaxant  Anemia Likely post procedure with blood loss. Monitor h&h  HTN Stable, continue norvasc and tenormin  RA No flare, continue weekly course of methotrexate and daily folic acid  Constipation Continue colace for now   Goals of care: short term rehabilitation    Labs/tests ordered: cbc, cmp    Blanchie Serve, MD  [facility] [phone] (Monday-Friday 8 am - 5 pm) [phone] (afterhours)

## 2014-06-21 NOTE — Telephone Encounter (Signed)
[facility] 

## 2014-06-26 ENCOUNTER — Non-Acute Institutional Stay (SKILLED_NURSING_FACILITY): Payer: Commercial Managed Care - HMO | Admitting: Adult Health

## 2014-06-26 ENCOUNTER — Encounter: Payer: Self-pay | Admitting: Adult Health

## 2014-06-26 DIAGNOSIS — M1712 Unilateral primary osteoarthritis, left knee: Secondary | ICD-10-CM

## 2014-06-26 DIAGNOSIS — Z96659 Presence of unspecified artificial knee joint: Secondary | ICD-10-CM

## 2014-06-26 DIAGNOSIS — Z96652 Presence of left artificial knee joint: Secondary | ICD-10-CM

## 2014-06-26 DIAGNOSIS — M1710 Unilateral primary osteoarthritis, unspecified knee: Secondary | ICD-10-CM

## 2014-06-26 DIAGNOSIS — M069 Rheumatoid arthritis, unspecified: Secondary | ICD-10-CM

## 2014-06-26 DIAGNOSIS — I10 Essential (primary) hypertension: Secondary | ICD-10-CM

## 2014-06-26 NOTE — Progress Notes (Signed)
Patient ID: Briana [name], female   DOB: 1931-09-10, 80 y.o.   MRN: [id number]     [facility]  Allergies  Allergen Reactions  . Ibuprofen Anaphylaxis  . Diltiazem Hcl     REACTION: ANGIO EDEMA  . Hydrochlorothiazide     REACTION: SWELLING  . Lisinopril     REACTION: SWELLING  . Verapamil     REACTION: SWELLING     Chief Complaint  Patient presents with  . Discharge Note    HPI:  She is being discharged to home with home health for pt/ot/aid. She will not need dme. She will need her prescriptions to be written and will need a follow up with her pcp. She had been hospitalized for a left knee replacement.    Past Medical History  Diagnosis Date  . ACUTE URIS OF UNSPECIFIED SITE 11/26/2008  . ALLERGIC RHINITIS 10/28/2007  . ARTHRITIS, KNEES, BILATERAL 10/28/2007  . BACK PAIN 04/12/2009  . BUNIONECTOMY, HX OF 10/28/2007  . CELLULITIS&ABSCESS OF HAND EXCEPT FINGERS&THUMB 10/23/2010  . HYPERLIPIDEMIA 10/28/2007  . HYPERTENSION 10/28/2007  . INSOMNIA UNSPECIFIED 10/31/2007  . MYOSITIS 10/22/2010  . NECK PAIN 12/03/2008  . Pain in joint, site unspecified 10/07/2010  . WRIST PAIN, LEFT 10/22/2010  . ALLERGIC ARTHRITIS OTHER SPECIFIED SITES 10/22/2010    OA + RA  . Complication of anesthesia     itching and makes me crazy  . Family history of anesthesia complication     DAUGHTER HAD HIVES AND " WENT WILD "    Past Surgical History  Procedure Laterality Date  . Arthroscopic surgery and rotator cuff repair left shoulder    . Arthroscopy right knee hx of surgery    . Hand surgery for broken finger, remote    . Bunionectomy      hx of  . Foot surgery      RIGHT  . Total knee arthroplasty  07/01/2012    Procedure: TOTAL KNEE ARTHROPLASTY;  Surgeon: Yvette Rack., MD;  Location: [place];  Service: Orthopedics;  Laterality: Right;  right total knee arthroplasty  . Joint replacement Right     knee  . Total knee arthroplasty Left 06/13/2014    dr Percell Miller  . Total knee arthroplasty  Left 06/13/2014    Procedure: TOTAL KNEE ARTHROPLASTY;  Surgeon: Ninetta Lights, MD;  Location: [place];  Service: Orthopedics;  Laterality: Left;    VITAL SIGNS BP 137/80  Pulse 76  Ht 5\' 4"  (1.626 m)  Wt 179 lb (81.194 kg)  BMI 30.71 kg/m2   Patient's Medications  New Prescriptions   No medications on file  Previous Medications   AMLODIPINE (NORVASC) 10 MG TABLET    Take 1 tablet (10 mg total) by mouth daily.   ATENOLOL (TENORMIN) 100 MG TABLET    Take 1 tablet (100 mg total) by mouth daily.   BISACODYL (DULCOLAX) 5 MG EC TABLET    Take 1 tablet (5 mg total) by mouth daily as needed for moderate constipation.   CHOLECALCIFEROL (VITAMIN D) 2000 UNITS TABLET    Take 2,000 Units by mouth daily.   DOCUSATE SODIUM (COLACE) 100 MG CAPSULE    Take 1 capsule (100 mg total) by mouth daily.   FEXOFENADINE (ALLEGRA) 180 MG TABLET    Take 180 mg by mouth daily.   FOLIC ACID (FOLVITE) 1 MG TABLET    Take 1 mg by mouth daily.   METHOCARBAMOL (ROBAXIN) 500 MG TABLET    Take 1 tablet (500  mg total) by mouth 4 (four) times daily.   METHOTREXATE (RHEUMATREX) 2.5 MG TABLET    Take 20 mg by mouth once a week. Caution:Chemotherapy. Protect from light.  Taken on Fridays.   OXYCODONE HCL 10 MG TABS    Take one tablet by mouth three times daily for pain   OXYCODONE-ACETAMINOPHEN (PERCOCET/ROXICET) 5-325 MG PER TABLET    Take one tablet by mouth every 4 hours as needed for pain   OXYCODONE-ACETAMINOPHEN (PERCOCET/ROXICET) 5-325 MG PER TABLET    Take 1 tablet by mouth every 4 (four) hours as needed for severe pain.   PROMETHAZINE (PHENERGAN) 12.5 MG TABLET    Take 1 tablet (12.5 mg total) by mouth every 6 (six) hours.   SIMVASTATIN (ZOCOR) 40 MG TABLET    Take 1 tablet (40 mg total) by mouth every evening.   TEMAZEPAM (RESTORIL) 15 MG CAPSULE    Take 1 capsule (15 mg total) by mouth at bedtime as needed for sleep.  Modified Medications   Modified Medication Previous Medication   OXYCODONE HCL, ABUSE DETER, 5  MG TABA OxyCODONE HCl, Abuse Deter, 5 MG TABA      Take 10 mg by mouth 3 (three) times daily. And every 3 hours as needed    Take 5 mg by mouth every 6 (six) hours. And every 3 hours as needed  Discontinued Medications   ENOXAPARIN (LOVENOX) 30 MG/0.3ML INJECTION    Inject 0.3 mLs (30 mg total) into the skin every 12 (twelve) hours.    SIGNIFICANT DIAGNOSTIC EXAMS  06-05-14: chest x-ray: No active cardiopulmonary disease.  06-13-14: left knee x-ray: Left knee prosthesis in anatomic alignment.    LABS REVIEWED:   04-19-14: tsh 1.28; hgb a1c 6.1; chol 184; ldl 38; trig 181 06-05-14: wbc 9.7; hgb 12.8; hct 37.5; mcv 94.7; plt 214; glucose 115; bun 17; creat 0.71; k+4.1; na++141; liver normal albumin 3.9 06-14-14: wbc 9.1; hgb 10.1; hct 29.0; mcv 94.2; lt 146; glucose 206; bun 16; creat 0.78; k+4.5; na++139      Review of Systems  Constitutional: Negative for malaise/fatigue.  Respiratory: Negative for cough and shortness of breath.   Cardiovascular: Negative for chest pain, palpitations and leg swelling.  Gastrointestinal: . Negative for heartburn, constipation and abdominal pain.  Musculoskeletal: negative for joint pain. Negative for myalgias.       Left knee replacement  Skin: Negative.   Neurological: Negative for headaches.  Psychiatric/Behavioral: Negative for depression. The patient is not nervous/anxious.      Physical Exam  Constitutional: She is oriented to person, place, and time. She appears well-developed and well-nourished. No distress.  overweight  Neck: Neck supple. No JVD present. No thyromegaly present.  Cardiovascular: Normal rate, regular rhythm and intact distal pulses.   Respiratory: Effort normal and breath sounds normal. No respiratory distress. She has no wheezes.  GI: Soft. Bowel sounds are normal. She exhibits no distension. There is no tenderness.  Musculoskeletal: She exhibits no edema.  Is able to move all extremities; is status post left knee  replacement   Neurological: She is alert and oriented to person, place, and time.  Skin: Skin is warm and dry. She is not diaphoretic.  Left knee incision line without signs of infection present      ASSESSMENT/ PLAN:  Will discharge her to home with home health for pt/ot/aid; to improve upon her gait; mobility and independence with adl's and adl care. She will not need dme. Her prescriptions have been written for a 3 day  supply of her medication with #60 oxycodone 10 mg tabs and # 30 percocet 5/325 mg tabs and #30 restoril 15 mg tabs. She has a follow up appointment with Dr. Scarlette Calico on 07-06-14 at 9:30 am.    Time spent with patient 40 minutes.    Ok Edwards NP [facility]  Contact [phone] Monday through Friday 8am- 5pm  After hours call [phone]

## 2014-07-06 ENCOUNTER — Encounter: Payer: Self-pay | Admitting: Internal Medicine

## 2014-07-06 ENCOUNTER — Ambulatory Visit (INDEPENDENT_AMBULATORY_CARE_PROVIDER_SITE_OTHER): Payer: Commercial Managed Care - HMO | Admitting: Internal Medicine

## 2014-07-06 ENCOUNTER — Other Ambulatory Visit (INDEPENDENT_AMBULATORY_CARE_PROVIDER_SITE_OTHER): Payer: Commercial Managed Care - HMO

## 2014-07-06 VITALS — BP 108/62 | HR 67 | Temp 97.8°F | Resp 16 | Ht 64.0 in | Wt 179.0 lb

## 2014-07-06 DIAGNOSIS — R7309 Other abnormal glucose: Secondary | ICD-10-CM

## 2014-07-06 DIAGNOSIS — T40605A Adverse effect of unspecified narcotics, initial encounter: Secondary | ICD-10-CM

## 2014-07-06 DIAGNOSIS — D510 Vitamin B12 deficiency anemia due to intrinsic factor deficiency: Secondary | ICD-10-CM

## 2014-07-06 DIAGNOSIS — K5903 Drug induced constipation: Secondary | ICD-10-CM

## 2014-07-06 DIAGNOSIS — I10 Essential (primary) hypertension: Secondary | ICD-10-CM

## 2014-07-06 DIAGNOSIS — M1712 Unilateral primary osteoarthritis, left knee: Secondary | ICD-10-CM

## 2014-07-06 DIAGNOSIS — T402X5A Adverse effect of other opioids, initial encounter: Secondary | ICD-10-CM

## 2014-07-06 DIAGNOSIS — K5909 Other constipation: Secondary | ICD-10-CM

## 2014-07-06 DIAGNOSIS — M1710 Unilateral primary osteoarthritis, unspecified knee: Secondary | ICD-10-CM

## 2014-07-06 LAB — BASIC METABOLIC PANEL
BUN: 13 mg/dL (ref 6–23)
CO2: 26 meq/L (ref 19–32)
CREATININE: 0.9 mg/dL (ref 0.4–1.2)
Calcium: 9.6 mg/dL (ref 8.4–10.5)
Chloride: 101 mEq/L (ref 96–112)
GFR: 78.31 mL/min (ref >60.00)
GLUCOSE: 117 mg/dL — AB (ref 70–99)
POTASSIUM: 4.2 meq/L (ref 3.5–5.1)
Sodium: 136 mEq/L (ref 135–145)

## 2014-07-06 LAB — IBC PANEL
IRON: 55 ug/dL (ref 42–145)
Saturation Ratios: 14.9 % — ABNORMAL LOW (ref 20.0–50.0)
Transferrin: 262.8 mg/dL (ref 212.0–360.0)

## 2014-07-06 LAB — HEMOGLOBIN A1C: Hgb A1c MFr Bld: 5.5 % (ref 4.6–6.5)

## 2014-07-06 LAB — RETICULOCYTES
ABS RETIC: 142.7 10*3/uL (ref 19.0–186.0)
RBC.: 3.48 MIL/uL — AB (ref 3.87–5.11)
RETIC CT PCT: 4.1 % — AB (ref 0.4–2.3)

## 2014-07-06 LAB — CBC WITH DIFFERENTIAL/PLATELET
BASOS PCT: 0.4 % (ref 0.0–3.0)
Basophils Absolute: 0 10*3/uL (ref 0.0–0.1)
EOS PCT: 0.7 % (ref 0.0–5.0)
Eosinophils Absolute: 0 10*3/uL (ref 0.0–0.7)
HCT: 33.6 % — ABNORMAL LOW (ref 36.0–46.0)
HEMOGLOBIN: 11.3 g/dL — AB (ref 12.0–15.0)
LYMPHS PCT: 17 % (ref 12.0–46.0)
Lymphs Abs: 1.2 10*3/uL (ref 0.7–4.0)
MCHC: 33.7 g/dL (ref 30.0–36.0)
MCV: 97.3 fl (ref 78.0–100.0)
Monocytes Absolute: 0.5 10*3/uL (ref 0.1–1.0)
Monocytes Relative: 7.7 % (ref 3.0–12.0)
NEUTROS ABS: 5.1 10*3/uL (ref 1.4–7.7)
NEUTROS PCT: 74.2 % (ref 43.0–77.0)
Platelets: 301 10*3/uL (ref 150.0–400.0)
RBC: 3.46 Mil/uL — ABNORMAL LOW (ref 3.87–5.11)
RDW: 17.5 % — ABNORMAL HIGH (ref 11.5–15.5)
WBC: 6.9 10*3/uL (ref 4.0–10.5)

## 2014-07-06 LAB — FERRITIN: Ferritin: 124.4 ng/mL (ref 10.0–291.0)

## 2014-07-06 LAB — FOLATE: Folate: >24.8 ng/mL (ref >5.9)

## 2014-07-06 LAB — VITAMIN B12: VITAMIN B 12: 577 pg/mL (ref 211–911)

## 2014-07-06 MED ORDER — FENTANYL 25 MCG/HR TD PT72: 25.0000 ug | MEDICATED_PATCH | TRANSDERMAL | Status: DC

## 2014-07-06 MED ORDER — NALOXEGOL OXALATE 12.5 MG PO TABS: 1.0000 | ORAL_TABLET | Freq: Every day | ORAL | Status: DC

## 2014-07-06 NOTE — Progress Notes (Signed)
Pre visit review using our clinic review tool, if applicable. No additional management support is needed unless otherwise documented below in the visit note. 

## 2014-07-06 NOTE — Patient Instructions (Signed)

## 2014-07-06 NOTE — Progress Notes (Signed)
Subjective:    Patient ID: Briana [name], female    DOB: February 05, 1931, 80 y.o.   MRN: [id number]  Arthritis Presents for follow-up visit. She complains of pain and stiffness. She reports no joint swelling or joint warmth. The symptoms have been stable. Her pain is at a severity of 6/10. Associated symptoms include fatigue, pain at night and pain while resting. Pertinent negatives include no diarrhea, dry eyes, dry mouth, dysuria, fever, rash, Raynaud's syndrome, uveitis or weight loss. Her past medical history is significant for osteoarthritis. Past treatments include acetaminophen and an opioid. The treatment provided significant relief. Factors aggravating her arthritis include activity. Compliance with prior treatments has been variable.      Review of Systems  Constitutional: Positive for fatigue. Negative for fever, chills, weight loss, diaphoresis, activity change, appetite change and unexpected weight change.  HENT: Negative.   Eyes: Negative.   Respiratory: Negative.  Negative for cough, choking, chest tightness, shortness of breath and stridor.   Cardiovascular: Negative.  Negative for chest pain, palpitations and leg swelling.  Gastrointestinal: Positive for constipation. Negative for nausea, vomiting, abdominal pain, diarrhea, blood in stool and anal bleeding.  Endocrine: Negative.   Genitourinary: Negative.  Negative for dysuria.  Musculoskeletal: Positive for arthralgias, arthritis and stiffness. Negative for back pain, gait problem, joint swelling, myalgias, neck pain and neck stiffness.  Skin: Negative.  Negative for rash.  Allergic/Immunologic: Negative.   Neurological: Negative.   Hematological: Negative.  Negative for adenopathy. Does not bruise/bleed easily.  Psychiatric/Behavioral: Negative.        Objective:   Physical Exam  Vitals reviewed. Constitutional: She is oriented to person, place, and time. She appears well-developed and well-nourished. No distress.    HENT:  Head: Normocephalic and atraumatic.  Mouth/Throat: Oropharynx is clear and moist. No oropharyngeal exudate.  Eyes: Conjunctivae are normal. Right eye exhibits no discharge. Left eye exhibits no discharge. No scleral icterus.  Neck: Normal range of motion. Neck supple. No JVD present. No tracheal deviation present. No thyromegaly present.  Cardiovascular: Normal rate, regular rhythm, normal heart sounds and intact distal pulses.  Exam reveals no gallop and no friction rub.   No murmur heard. Pulmonary/Chest: Effort normal and breath sounds normal. No stridor. No respiratory distress. She has no wheezes. She has no rales. She exhibits no tenderness.  Abdominal: Soft. Bowel sounds are normal. She exhibits no distension and no mass. There is no tenderness. There is no rebound and no guarding.  Musculoskeletal: Normal range of motion. She exhibits no edema and no tenderness.       Right knee: She exhibits deformity (DJD). She exhibits normal range of motion, no swelling, no effusion, no ecchymosis, no laceration, no erythema, normal alignment, no LCL laxity, normal patellar mobility and no bony tenderness. No tenderness found.       Left knee: She exhibits deformity (DJD). She exhibits normal range of motion, no swelling, no effusion, no ecchymosis, no laceration, no erythema, normal alignment, no LCL laxity, normal patellar mobility and no bony tenderness. No tenderness found.  Lymphadenopathy:    She has no cervical adenopathy.  Neurological: She is oriented to person, place, and time.  Skin: Skin is warm and dry. No rash noted. She is not diaphoretic. No erythema. No pallor.  Psychiatric: She has a normal mood and affect. Her behavior is normal. Judgment and thought content normal.    Lab Results  Component Value Date   WBC 9.1 06/14/2014   HGB 10.1* 06/14/2014   HCT  29.0* 06/14/2014   PLT 146* 06/14/2014   GLUCOSE 206* 06/14/2014   CHOL 184 04/19/2014   TRIG 181.0* 04/19/2014   HDL 109.40  04/19/2014   LDLDIRECT 104.5 04/25/2010   LDLCALC 38 04/19/2014   ALT 20 06/05/2014   AST 20 06/05/2014   NA 139 06/14/2014   K 4.5 06/14/2014   CL 102 06/14/2014   CREATININE 0.78 06/14/2014   BUN 16 06/14/2014   CO2 20 06/14/2014   TSH 1.28 04/19/2014   INR 0.92 06/05/2014   HGBA1C 6.1 04/19/2014        Assessment & Plan:

## 2014-07-08 NOTE — Assessment & Plan Note (Signed)
She reports nausea and vomiting from percocet, will stop that Will control her pain with fenatnyl

## 2014-07-08 NOTE — Assessment & Plan Note (Signed)
Her CBC shows improvement Vitamin levels are all normal This appears to be anemia of chronic disease

## 2014-07-08 NOTE — Assessment & Plan Note (Signed)
Will try movantik for this

## 2014-07-08 NOTE — Assessment & Plan Note (Signed)
Her BP is well controlled 

## 2014-07-23 ENCOUNTER — Telehealth: Payer: Self-pay | Admitting: Internal Medicine

## 2014-07-23 MED ORDER — PROMETHAZINE HCL 12.5 MG PO TABS: 12.5000 mg | ORAL_TABLET | Freq: Four times a day (QID) | ORAL | Status: DC | PRN

## 2014-07-23 NOTE — Telephone Encounter (Signed)
Notified pt with md response. MD sent to [address].../lmb

## 2014-07-23 NOTE — Telephone Encounter (Signed)
Patient is having a lot of nausea and is feeling really sick could something be call into her pharmacy that her insurance will cover.  Please advise

## 2014-07-23 NOTE — Telephone Encounter (Signed)
Try phenergan

## 2014-07-31 ENCOUNTER — Telehealth: Payer: Self-pay | Admitting: *Deleted

## 2014-07-31 NOTE — Telephone Encounter (Signed)
Left msg on triage stating saw md 2 weeks ago had labs done never received results....Johny Chess

## 2014-07-31 NOTE — Telephone Encounter (Signed)
Her labs looked good

## 2014-08-01 NOTE — Telephone Encounter (Signed)
Notified pt with md response.../lmb 

## 2014-08-27 ENCOUNTER — Telehealth: Payer: Self-pay | Admitting: Internal Medicine

## 2014-08-27 ENCOUNTER — Telehealth: Payer: Self-pay | Admitting: *Deleted

## 2014-08-27 NOTE — Telephone Encounter (Signed)
Patient called back in regards.  States she was getting ready to leave so she didn't know if she would be in for the phone call.  I offered to make an appointment at another office.  She did not want to do that.  I did tell her to try urgent care.  She stated if she got worse and was not in for a return phone call she would do that.

## 2014-08-27 NOTE — Telephone Encounter (Signed)
[facility] Triage Call Report Triage Record Num: [id number] Operator: Nancie Neas Patient Name: Briana [name] Call Date & Time: 08/27/2014 8:43:22AM Patient Phone: [phone] PCP: Scarlette Calico Patient Gender: Female PCP Fax : Patient DOB: 05/05/1931 Practice Name: [facility] Reason for Call: Caller: Kenia/Patient; PCP: Scarlette Calico (Adults only); CB#: [phone]; Today, 08/27/2014, Pt calling with c/o left earache and neck pain since 08/25/2014. She has been taking Tylenol Arthritis with some relief but rating neck pain 7-10 during call. Also with pain with moving neck. No known injury . RN reached See in 4 hours for severe pain per VEL:[id number] protocol along with neck pain and symptoms of viral illness that are not improved or getting worse with 24 hours of home care per Neck Pain protocol. Care advice given and RN advised Tylenol per package instructions. RN attempted to schedule OV but no appts available. RN offered to schedule OV at [place] office , but pt commenting she isnt sure she could get there with her disabled son who is driving. OFFICE FOLLOW UP REQUEST -- DISPOSTION OF CALL IS SEE IN 4 HOURS ----PT WOULD LIKE TO BE SEEN IN OFFICE--PLEASE CALL BACK. ---EPIC NOTE SENT Protocol(s) Used: Ear: Symptoms Recommended Outcome per Protocol: See Provider within 4 hours Reason for Outcome: Severe pain (sharp, stabbing, throbbing or excruciating aching) unresponsive to 24 hours of home care Care Advice: Resting or sleeping with head elevated, such as semi-reclining in a recliner, may help reduce inner ear pressure and discomfort. ~ Keep ear canal as dry as possible. Take a bath instead of showering. Try to keep water out of ear when washing hair by placing cotton balls in ear opening. Avoid swimming or water sports until [place] by provider. ~ 11/

## 2014-08-27 NOTE — Telephone Encounter (Signed)
Caller: Briana [name]/Patient; PCP: Scarlette Calico (Adults only); CB#: [phone];  Today, 08/27/2014, Pt calling with c/o left  earache and neck pain  since 08/25/2014. She has been taking Tylenol Arthritis  with some relief but rating neck pain 7-10 during call. Also with pain with moving neck. No known injury .   RN reached See in 4 hours for severe pain per [id number] protocol along with neck pain and symptoms of viral illness that are not improved  or getting worse with 24 hours of home care per Neck Pain protocol. RN attempted to schedule OV but no appts available. RN offered to schedule OV at [place] office , but pt commenting she isnt sure she could get there with her disabled son who is driving. OFFICE FOLLOW UP REQUEST --  DISPOSTION OF CALL IS SEE IN 4 HOURS ----PT WOULD LIKE TO BE SEEN IN OFFICE--PLEASE CALL BACK.

## 2014-09-03 ENCOUNTER — Encounter: Payer: Self-pay | Admitting: Internal Medicine

## 2014-09-03 ENCOUNTER — Ambulatory Visit (INDEPENDENT_AMBULATORY_CARE_PROVIDER_SITE_OTHER): Payer: Commercial Managed Care - HMO | Admitting: Internal Medicine

## 2014-09-03 ENCOUNTER — Ambulatory Visit (INDEPENDENT_AMBULATORY_CARE_PROVIDER_SITE_OTHER)
Admission: RE | Admit: 2014-09-03 | Discharge: 2014-09-03 | Disposition: A | Discharge status: Home / Self Care | Payer: Commercial Managed Care - HMO | Source: Ambulatory Visit | Attending: Internal Medicine | Admitting: Internal Medicine

## 2014-09-03 VITALS — BP 132/60 | HR 70 | Temp 97.7°F | Resp 16 | Ht 64.0 in | Wt 181.0 lb

## 2014-09-03 DIAGNOSIS — M170 Bilateral primary osteoarthritis of knee: Secondary | ICD-10-CM

## 2014-09-03 DIAGNOSIS — M542 Cervicalgia: Secondary | ICD-10-CM

## 2014-09-03 MED ORDER — HYDROCODONE-ACETAMINOPHEN 5-325 MG PO TABS: 1.0000 | ORAL_TABLET | Freq: Four times a day (QID) | ORAL | Status: DC | PRN

## 2014-09-03 NOTE — Progress Notes (Signed)
Subjective:    Patient ID: Briana [name], female    DOB: 09/16/1931, 80 y.o.   MRN: [id number]  Neck Pain  This is a recurrent problem. The current episode started more than 1 month ago. The problem occurs intermittently. The problem has been unchanged. The pain is associated with nothing. The pain is present in the left side and right side. The quality of the pain is described as aching. The pain is at a severity of 3/10. The pain is mild. Nothing aggravates the symptoms. The pain is same all the time. Pertinent negatives include no chest pain, fever, headaches, leg pain, numbness, pain with swallowing, paresis, photophobia, syncope, tingling, trouble swallowing, visual change, weakness or weight loss. She has tried oral narcotics for the symptoms. The treatment provided mild relief.      Review of Systems  Constitutional: Negative.  Negative for fever, chills, weight loss, diaphoresis, appetite change and fatigue.  HENT: Negative.  Negative for trouble swallowing.   Eyes: Negative.  Negative for photophobia, pain, redness and itching.  Respiratory: Negative.  Negative for cough, choking, chest tightness, shortness of breath and stridor.   Cardiovascular: Negative.  Negative for chest pain, palpitations, leg swelling and syncope.  Gastrointestinal: Negative.  Negative for nausea, vomiting, abdominal pain, diarrhea, constipation and blood in stool.  Endocrine: Negative.   Genitourinary: Negative.   Musculoskeletal: Positive for neck pain and neck stiffness. Negative for myalgias, back pain, joint swelling, arthralgias and gait problem.  Skin: Negative.  Negative for rash.  Allergic/Immunologic: Negative.   Neurological: Negative.  Negative for tingling, weakness, numbness and headaches.  Hematological: Negative.  Negative for adenopathy. Does not bruise/bleed easily.  Psychiatric/Behavioral: Negative.        Objective:   Physical Exam  Constitutional: She is oriented to person,  place, and time. She appears well-developed and well-nourished. No distress.  HENT:  Head: Normocephalic and atraumatic.  Mouth/Throat: Oropharynx is clear and moist. No oropharyngeal exudate.  Eyes: Conjunctivae are normal. Right eye exhibits no discharge. Left eye exhibits no discharge. No scleral icterus.  Neck: Trachea normal and normal range of motion. Neck supple. No JVD present. No tracheal tenderness present. Carotid bruit is not present. No tracheal deviation and no edema present. No thyroid mass and no thyromegaly present.  Cardiovascular: Normal rate, regular rhythm, normal heart sounds and intact distal pulses.  Exam reveals no gallop and no friction rub.   No murmur heard. Pulmonary/Chest: Effort normal and breath sounds normal. No stridor. No respiratory distress. She has no wheezes. She has no rales. She exhibits no tenderness.  Abdominal: Soft. Bowel sounds are normal. She exhibits no distension and no mass. There is no tenderness. There is no rebound and no guarding.  Musculoskeletal:       Cervical back: She exhibits decreased range of motion. She exhibits no tenderness, no bony tenderness, no swelling, no edema, no deformity, no pain and no spasm.  Lymphadenopathy:    She has no cervical adenopathy.  Neurological: She is alert and oriented to person, place, and time. She has normal strength. She displays no atrophy and no tremor. No cranial nerve deficit or sensory deficit. She exhibits normal muscle tone. She displays a negative Romberg sign. She displays no seizure activity. Coordination and gait normal.  Reflex Scores:      Tricep reflexes are 1+ on the right side and 1+ on the left side.      Bicep reflexes are 1+ on the right side and 1+ on the  left side.      Brachioradialis reflexes are 1+ on the right side and 1+ on the left side.      Patellar reflexes are 1+ on the right side and 1+ on the left side.      Achilles reflexes are 1+ on the right side and 1+ on the left  side. Skin: Skin is warm and dry. No rash noted. She is not diaphoretic. No erythema. No pallor.  Vitals reviewed.     Lab Results  Component Value Date   WBC 6.9 07/06/2014   HGB 11.3* 07/06/2014   HCT 33.6* 07/06/2014   PLT 301.0 07/06/2014   GLUCOSE 117* 07/06/2014   CHOL 184 04/19/2014   TRIG 181.0* 04/19/2014   HDL 109.40 04/19/2014   LDLDIRECT 104.5 04/25/2010   LDLCALC 38 04/19/2014   ALT 20 06/05/2014   AST 20 06/05/2014   NA 136 07/06/2014   K 4.2 07/06/2014   CL 101 07/06/2014   CREATININE 0.9 07/06/2014   BUN 13 07/06/2014   CO2 26 07/06/2014   TSH 1.28 04/19/2014   INR 0.92 06/05/2014   HGBA1C 5.5 07/06/2014      Assessment & Plan:

## 2014-09-03 NOTE — Patient Instructions (Signed)

## 2014-09-03 NOTE — Progress Notes (Signed)
Pre visit review using our clinic review tool, if applicable. No additional management support is needed unless otherwise documented below in the visit note. 

## 2014-09-04 NOTE — Assessment & Plan Note (Addendum)
She has worsening cervical spondylosis and does not feel that the fentanyl patch is helping very much Will offer norco as needed for pain, if that does not help then will send to pain management

## 2014-09-04 NOTE — Assessment & Plan Note (Signed)
She will try norco for this

## 2014-12-18 DIAGNOSIS — Z79899 Other long term (current) drug therapy: Secondary | ICD-10-CM | POA: Diagnosis not present

## 2014-12-18 DIAGNOSIS — M150 Primary generalized (osteo)arthritis: Secondary | ICD-10-CM | POA: Diagnosis not present

## 2014-12-18 DIAGNOSIS — M0609 Rheumatoid arthritis without rheumatoid factor, multiple sites: Secondary | ICD-10-CM | POA: Diagnosis not present

## 2014-12-18 DIAGNOSIS — M2550 Pain in unspecified joint: Secondary | ICD-10-CM | POA: Diagnosis not present

## 2015-01-08 DIAGNOSIS — Z96652 Presence of left artificial knee joint: Secondary | ICD-10-CM | POA: Diagnosis not present

## 2015-02-04 ENCOUNTER — Telehealth: Payer: Self-pay | Admitting: Internal Medicine

## 2015-02-04 DIAGNOSIS — I10 Essential (primary) hypertension: Secondary | ICD-10-CM

## 2015-02-04 DIAGNOSIS — E785 Hyperlipidemia, unspecified: Secondary | ICD-10-CM

## 2015-02-04 MED ORDER — ATENOLOL 100 MG PO TABS: 100.0000 mg | ORAL_TABLET | Freq: Every day | ORAL | Status: DC

## 2015-02-04 MED ORDER — SIMVASTATIN 40 MG PO TABS: 40.0000 mg | ORAL_TABLET | Freq: Every evening | ORAL | Status: DC

## 2015-02-04 MED ORDER — AMLODIPINE BESYLATE 10 MG PO TABS: 10.0000 mg | ORAL_TABLET | Freq: Every day | ORAL | Status: DC

## 2015-02-04 NOTE — Telephone Encounter (Signed)
Rx approved and sent to pharmacy via e-script.

## 2015-02-04 NOTE — Telephone Encounter (Signed)
Patient is requesting a refill of these scripts to [facility] mail order pharmacy   atenolol (TENORMIN) 100 MG tablet [[id number]]  amLODipine (NORVASC) 10 MG tablet [[id number]]  simvastatin (ZOCOR) 40 MG tablet [[id number]]

## 2015-02-06 ENCOUNTER — Telehealth: Payer: Self-pay | Admitting: Internal Medicine

## 2015-02-06 DIAGNOSIS — E785 Hyperlipidemia, unspecified: Secondary | ICD-10-CM

## 2015-02-06 DIAGNOSIS — I10 Essential (primary) hypertension: Secondary | ICD-10-CM

## 2015-02-06 NOTE — Telephone Encounter (Signed)
Pt called in and she said that all 3 meds that was sent in on the 4/25 should have been sent to [facility] instead of [facility].  She wanted to know if they can be resent to her mail order?

## 2015-02-07 MED ORDER — ATENOLOL 100 MG PO TABS: 100.0000 mg | ORAL_TABLET | Freq: Every day | ORAL | Status: DC

## 2015-02-07 MED ORDER — SIMVASTATIN 40 MG PO TABS: 40.0000 mg | ORAL_TABLET | Freq: Every evening | ORAL | Status: DC

## 2015-02-07 MED ORDER — AMLODIPINE BESYLATE 10 MG PO TABS: 10.0000 mg | ORAL_TABLET | Freq: Every day | ORAL | Status: DC

## 2015-02-07 NOTE — Telephone Encounter (Signed)
Notified pt resent to [facility]...Briana [name]

## 2015-03-21 DIAGNOSIS — R300 Dysuria: Secondary | ICD-10-CM | POA: Diagnosis not present

## 2015-03-21 DIAGNOSIS — M2550 Pain in unspecified joint: Secondary | ICD-10-CM | POA: Diagnosis not present

## 2015-03-21 DIAGNOSIS — M0609 Rheumatoid arthritis without rheumatoid factor, multiple sites: Secondary | ICD-10-CM | POA: Diagnosis not present

## 2015-03-21 DIAGNOSIS — M150 Primary generalized (osteo)arthritis: Secondary | ICD-10-CM | POA: Diagnosis not present

## 2015-03-21 DIAGNOSIS — N390 Urinary tract infection, site not specified: Secondary | ICD-10-CM | POA: Diagnosis not present

## 2015-03-21 DIAGNOSIS — Z79899 Other long term (current) drug therapy: Secondary | ICD-10-CM | POA: Diagnosis not present

## 2015-04-01 DIAGNOSIS — M059 Rheumatoid arthritis with rheumatoid factor, unspecified: Secondary | ICD-10-CM | POA: Diagnosis not present

## 2015-04-01 DIAGNOSIS — N390 Urinary tract infection, site not specified: Secondary | ICD-10-CM | POA: Diagnosis not present

## 2015-04-02 ENCOUNTER — Ambulatory Visit (INDEPENDENT_AMBULATORY_CARE_PROVIDER_SITE_OTHER): Payer: Commercial Managed Care - HMO | Admitting: Internal Medicine

## 2015-04-02 ENCOUNTER — Ambulatory Visit (INDEPENDENT_AMBULATORY_CARE_PROVIDER_SITE_OTHER)
Admission: RE | Admit: 2015-04-02 | Discharge: 2015-04-02 | Disposition: A | Discharge status: Home / Self Care | Payer: Commercial Managed Care - HMO | Source: Ambulatory Visit | Attending: Internal Medicine | Admitting: Internal Medicine

## 2015-04-02 ENCOUNTER — Encounter: Payer: Self-pay | Admitting: Internal Medicine

## 2015-04-02 VITALS — BP 160/78 | HR 62 | Temp 98.0°F | Resp 18 | Wt 173.0 lb

## 2015-04-02 DIAGNOSIS — M25532 Pain in left wrist: Secondary | ICD-10-CM

## 2015-04-02 DIAGNOSIS — M7989 Other specified soft tissue disorders: Secondary | ICD-10-CM | POA: Diagnosis not present

## 2015-04-02 DIAGNOSIS — W07XXXA Fall from chair, initial encounter: Secondary | ICD-10-CM

## 2015-04-02 DIAGNOSIS — S6992XA Unspecified injury of left wrist, hand and finger(s), initial encounter: Secondary | ICD-10-CM | POA: Diagnosis not present

## 2015-04-02 DIAGNOSIS — M19032 Primary osteoarthritis, left wrist: Secondary | ICD-10-CM | POA: Diagnosis not present

## 2015-04-02 MED ORDER — HYDROCODONE-ACETAMINOPHEN 5-325 MG PO TABS: 1.0000 | ORAL_TABLET | Freq: Four times a day (QID) | ORAL | Status: DC | PRN

## 2015-04-02 NOTE — Progress Notes (Signed)
Pre visit review using our clinic review tool, if applicable. No additional management support is needed unless otherwise documented below in the visit note. 

## 2015-04-02 NOTE — Progress Notes (Signed)
   Subjective:    Patient ID: Briana [name], female    DOB: 04-Dec-1930, 81 y.o.   MRN: [id number]  HPI She was sitting in a chair 03/29/15 when the chair leg broke. She had mild pain in the left wrist after the fall but no obvious edema. Over the last 4 days there has been progressive swelling and pain in the hand. She took a leftover hydrocodone last night for the pain because she could not sleep. She also has been icing the wrist each night.  She has had knee surgery bilaterally by Dr Murphy[facility].  In July 2015 a bone density was ordered but never completed.   Review of Systems  Denied were any change in heart rhythm or rate prior to the event. There was no associated chest pain or shortness of breath .  Also specifically denied prior to the episode were headache, limb weakness, tingling, or numbness. No seizure activity noted.      Objective:   Physical Exam  Pertinent or positive findings include:There is visible swelling of the left wrist medially with decreased range of motion. There is marked tenderness to even light palpation. Radial artery pulses are intact. There is no associated evidence of cellulitis. No epitrochlear or axillary lymphadenopathy are present.  General appearance :adequately nourished; in no distress.  Eyes: No conjunctival inflammation or scleral icterus is present.  Heart:  Normal rate and regular rhythm. S1 and S2 normal without gallop, murmur, click, rub or other extra sounds    Lungs:Chest clear to auscultation; no wheezes, rhonchi,rales ,or rubs present.No increased work of breathing.   Abdomen: bowel sounds normal, soft and non-tender without masses, organomegaly or hernias noted.  No guarding or rebound.   Vascular : all pulses equal ; no bruits present.  Skin:Warm & dry.  Intact without suspicious lesions or rashes ; no tenting   Neuro: Strength, tone decreased         Assessment & Plan:  #1 wrist pain and swelling sustained  in a mechanical fall  #2 bone integrity, questionable status. Bone mineral density study will be requested.  See orders/ recommendations

## 2015-04-02 NOTE — Patient Instructions (Addendum)
  Your next office appointment will be determined based upon review of your pending  xrays  Those written interpretation of the results and instructions will be transmitted to you by mail for your records.  Critical results will be called.   Followup as needed for any active or acute issue. Please report any significant change in your symptoms.  [phone]

## 2015-04-03 ENCOUNTER — Other Ambulatory Visit: Payer: Self-pay | Admitting: Internal Medicine

## 2015-04-03 DIAGNOSIS — M25532 Pain in left wrist: Secondary | ICD-10-CM

## 2015-04-04 DIAGNOSIS — H10413 Chronic giant papillary conjunctivitis, bilateral: Secondary | ICD-10-CM | POA: Diagnosis not present

## 2015-04-04 DIAGNOSIS — H43813 Vitreous degeneration, bilateral: Secondary | ICD-10-CM | POA: Diagnosis not present

## 2015-04-04 DIAGNOSIS — H25813 Combined forms of age-related cataract, bilateral: Secondary | ICD-10-CM | POA: Diagnosis not present

## 2015-04-16 DIAGNOSIS — N390 Urinary tract infection, site not specified: Secondary | ICD-10-CM | POA: Diagnosis not present

## 2015-04-30 DIAGNOSIS — M25562 Pain in left knee: Secondary | ICD-10-CM | POA: Diagnosis not present

## 2015-04-30 DIAGNOSIS — Z96652 Presence of left artificial knee joint: Secondary | ICD-10-CM | POA: Diagnosis not present

## 2015-04-30 DIAGNOSIS — M25512 Pain in left shoulder: Secondary | ICD-10-CM | POA: Diagnosis not present

## 2015-05-20 DIAGNOSIS — H25811 Combined forms of age-related cataract, right eye: Secondary | ICD-10-CM | POA: Diagnosis not present

## 2015-05-20 DIAGNOSIS — H2511 Age-related nuclear cataract, right eye: Secondary | ICD-10-CM | POA: Diagnosis not present

## 2015-05-27 DIAGNOSIS — H2512 Age-related nuclear cataract, left eye: Secondary | ICD-10-CM | POA: Diagnosis not present

## 2015-06-03 DIAGNOSIS — H2512 Age-related nuclear cataract, left eye: Secondary | ICD-10-CM | POA: Diagnosis not present

## 2015-06-03 DIAGNOSIS — H25812 Combined forms of age-related cataract, left eye: Secondary | ICD-10-CM | POA: Diagnosis not present

## 2015-07-08 ENCOUNTER — Telehealth: Payer: Self-pay | Admitting: Internal Medicine

## 2015-07-08 NOTE — Telephone Encounter (Signed)
Is requesting [employer] referral to Dr. Gavin Pound.  Patient appointment is Oct 12th.  Patient is going for a follow up.

## 2015-07-11 NOTE — Telephone Encounter (Signed)
Patient's previous referral is still valid. Josem Kaufmann #[id number] valid 03/19/2015 - 09/15/2015 for 6 visits.

## 2015-07-16 DIAGNOSIS — H524 Presbyopia: Secondary | ICD-10-CM | POA: Diagnosis not present

## 2015-07-16 DIAGNOSIS — Z961 Presence of intraocular lens: Secondary | ICD-10-CM | POA: Diagnosis not present

## 2015-07-24 DIAGNOSIS — M2550 Pain in unspecified joint: Secondary | ICD-10-CM | POA: Diagnosis not present

## 2015-07-24 DIAGNOSIS — Z79899 Other long term (current) drug therapy: Secondary | ICD-10-CM | POA: Diagnosis not present

## 2015-07-24 DIAGNOSIS — M150 Primary generalized (osteo)arthritis: Secondary | ICD-10-CM | POA: Diagnosis not present

## 2015-07-24 DIAGNOSIS — M0609 Rheumatoid arthritis without rheumatoid factor, multiple sites: Secondary | ICD-10-CM | POA: Diagnosis not present

## 2015-08-14 ENCOUNTER — Telehealth: Payer: Self-pay | Admitting: Internal Medicine

## 2015-08-14 NOTE — Telephone Encounter (Signed)
Josem Kaufmann # [id number]  Start date 08/14/15 exp 02/10/16 good for 6 visits with Dr Kathryne Hitch

## 2015-08-14 NOTE — Telephone Encounter (Signed)
Patient is requesting [employer] referral to Percell Miller and Noemi Chapel to follow up on leg pain after surgery.  States office will not schedule appointment until they get referral.  Patient is trying to get in on Friday.

## 2015-08-16 DIAGNOSIS — M545 Low back pain: Secondary | ICD-10-CM | POA: Diagnosis not present

## 2015-08-16 DIAGNOSIS — M25552 Pain in left hip: Secondary | ICD-10-CM | POA: Diagnosis not present

## 2015-08-29 DIAGNOSIS — Z1231 Encounter for screening mammogram for malignant neoplasm of breast: Secondary | ICD-10-CM | POA: Diagnosis not present

## 2015-08-29 LAB — HM MAMMOGRAPHY

## 2015-08-30 ENCOUNTER — Encounter: Payer: Self-pay | Admitting: Internal Medicine

## 2015-09-13 DIAGNOSIS — M545 Low back pain: Secondary | ICD-10-CM | POA: Diagnosis not present

## 2015-09-13 DIAGNOSIS — M25552 Pain in left hip: Secondary | ICD-10-CM | POA: Diagnosis not present

## 2015-09-19 DIAGNOSIS — M545 Low back pain: Secondary | ICD-10-CM | POA: Diagnosis not present

## 2015-09-23 ENCOUNTER — Telehealth: Payer: Self-pay | Admitting: Internal Medicine

## 2015-09-23 NOTE — Telephone Encounter (Signed)
Needs [facility] referral.

## 2015-09-25 DIAGNOSIS — M545 Low back pain: Secondary | ICD-10-CM | POA: Diagnosis not present

## 2015-09-25 NOTE — Telephone Encounter (Signed)
Referral [id number]  start date 09/24/15, exp 03/22/16 good for 6 visits.

## 2015-10-15 ENCOUNTER — Other Ambulatory Visit: Payer: Self-pay | Admitting: Internal Medicine

## 2015-10-23 ENCOUNTER — Encounter: Payer: Self-pay | Admitting: Internal Medicine

## 2015-10-23 ENCOUNTER — Ambulatory Visit (INDEPENDENT_AMBULATORY_CARE_PROVIDER_SITE_OTHER): Payer: Commercial Managed Care - HMO | Admitting: Internal Medicine

## 2015-10-23 ENCOUNTER — Other Ambulatory Visit (INDEPENDENT_AMBULATORY_CARE_PROVIDER_SITE_OTHER): Payer: Commercial Managed Care - HMO

## 2015-10-23 VITALS — BP 118/60 | HR 53 | Temp 97.8°F | Resp 16 | Ht 64.0 in | Wt 176.0 lb

## 2015-10-23 DIAGNOSIS — E785 Hyperlipidemia, unspecified: Secondary | ICD-10-CM

## 2015-10-23 DIAGNOSIS — D510 Vitamin B12 deficiency anemia due to intrinsic factor deficiency: Secondary | ICD-10-CM | POA: Diagnosis not present

## 2015-10-23 DIAGNOSIS — I10 Essential (primary) hypertension: Secondary | ICD-10-CM

## 2015-10-23 DIAGNOSIS — M05712 Rheumatoid arthritis with rheumatoid factor of left shoulder without organ or systems involvement: Secondary | ICD-10-CM

## 2015-10-23 DIAGNOSIS — Z0001 Encounter for general adult medical examination with abnormal findings: Secondary | ICD-10-CM

## 2015-10-23 DIAGNOSIS — Z23 Encounter for immunization: Secondary | ICD-10-CM

## 2015-10-23 DIAGNOSIS — M05711 Rheumatoid arthritis with rheumatoid factor of right shoulder without organ or systems involvement: Secondary | ICD-10-CM

## 2015-10-23 DIAGNOSIS — M170 Bilateral primary osteoarthritis of knee: Secondary | ICD-10-CM | POA: Diagnosis not present

## 2015-10-23 DIAGNOSIS — Z0000 Encounter for general adult medical examination without abnormal findings: Secondary | ICD-10-CM

## 2015-10-23 LAB — COMPREHENSIVE METABOLIC PANEL
ALK PHOS: 92 U/L (ref 39–117)
ALT: 20 U/L (ref 0–35)
AST: 22 U/L (ref 0–37)
Albumin: 4.3 g/dL (ref 3.5–5.2)
BUN: 15 mg/dL (ref 6–23)
CO2: 30 mEq/L (ref 19–32)
Calcium: 10 mg/dL (ref 8.4–10.5)
Chloride: 102 mEq/L (ref 96–112)
Creatinine, Ser: 0.87 mg/dL (ref 0.40–1.20)
GFR: 80.14 mL/min (ref >60.00)
GLUCOSE: 130 mg/dL — AB (ref 70–99)
POTASSIUM: 4.3 meq/L (ref 3.5–5.1)
SODIUM: 141 meq/L (ref 135–145)
TOTAL PROTEIN: 7.3 g/dL (ref 6.0–8.3)
Total Bilirubin: 0.7 mg/dL (ref 0.2–1.2)

## 2015-10-23 LAB — CBC WITH DIFFERENTIAL/PLATELET
Basophils Absolute: 0 10*3/uL (ref 0.0–0.1)
Basophils Relative: 0.4 % (ref 0.0–3.0)
Eosinophils Absolute: 0.2 10*3/uL (ref 0.0–0.7)
Eosinophils Relative: 1.8 % (ref 0.0–5.0)
HEMATOCRIT: 41 % (ref 36.0–46.0)
HEMOGLOBIN: 13.6 g/dL (ref 12.0–15.0)
LYMPHS ABS: 1.6 10*3/uL (ref 0.7–4.0)
Lymphocytes Relative: 15.8 % (ref 12.0–46.0)
MCHC: 33.2 g/dL (ref 30.0–36.0)
MCV: 94.8 fl (ref 78.0–100.0)
MONO ABS: 0.6 10*3/uL (ref 0.1–1.0)
Monocytes Relative: 5.9 % (ref 3.0–12.0)
Neutro Abs: 7.5 10*3/uL (ref 1.4–7.7)
Neutrophils Relative %: 76.1 % (ref 43.0–77.0)
Platelets: 221 10*3/uL (ref 150.0–400.0)
RBC: 4.32 Mil/uL (ref 3.87–5.11)
RDW: 15.3 % (ref 11.5–15.5)
WBC: 9.9 10*3/uL (ref 4.0–10.5)

## 2015-10-23 LAB — LIPID PANEL
Cholesterol: 218 mg/dL — ABNORMAL HIGH (ref 0–200)
HDL: 111.5 mg/dL (ref >39.00)
LDL Cholesterol: 86 mg/dL (ref 0–99)
NONHDL: 106
Total CHOL/HDL Ratio: 2
Triglycerides: 101 mg/dL (ref 0.0–149.0)
VLDL: 20.2 mg/dL (ref 0.0–40.0)

## 2015-10-23 LAB — TSH: TSH: 2 u[IU]/mL (ref 0.35–4.50)

## 2015-10-23 MED ORDER — TRAMADOL HCL 50 MG PO TABS
50.0000 mg | ORAL_TABLET | Freq: Two times a day (BID) | ORAL | Status: DC | PRN
Start: 2015-10-23 — End: 2017-12-02

## 2015-10-23 NOTE — Progress Notes (Addendum)
Subjective:  Patient ID: Briana [name], female    DOB: [birth year]  Age: 82 y.o. MRN: [id number]  CC: Annual Exam; Hyperlipidemia; Anemia; and Osteoarthritis   HPI Briana [name] presents for a CPX. She complains of fatigue and arthritis pain in her shoulders and knees.  Outpatient Prescriptions Prior to Visit  Medication Sig Dispense Refill  . amLODipine (NORVASC) 10 MG tablet TAKE 1 TABLET EVERY DAY 90 tablet 2  . atenolol (TENORMIN) 100 MG tablet TAKE 1 TABLET EVERY DAY 90 tablet 2  . Cholecalciferol (VITAMIN D) 2000 UNITS tablet Take 2,000 Units by mouth daily.    . fexofenadine (ALLEGRA) 180 MG tablet Take 180 mg by mouth daily.    . folic acid (FOLVITE) 1 MG tablet Take 1 mg by mouth daily.    . methotrexate (RHEUMATREX) 2.5 MG tablet Take 20 mg by mouth once a week. Caution:Chemotherapy. Protect from light.  Taken on Fridays.    . simvastatin (ZOCOR) 40 MG tablet Take 1 tablet (40 mg total) by mouth every evening. 90 tablet 2  . HYDROcodone-acetaminophen (NORCO/VICODIN) 5-325 MG per tablet Take 1 tablet by mouth every 6 (six) hours as needed for moderate pain. 30 tablet 0  . temazepam (RESTORIL) 15 MG capsule Take 1 capsule (15 mg total) by mouth at bedtime as needed for sleep. (Patient not taking: Reported on 10/23/2015) 30 capsule 5   No facility-administered medications prior to visit.    ROS Review of Systems  Constitutional: Positive for fatigue. Negative for fever, chills, diaphoresis, appetite change and unexpected weight change.  HENT: Negative.  Negative for congestion and sore throat.   Eyes: Negative.   Respiratory: Negative.  Negative for cough, choking, chest tightness, shortness of breath and stridor.   Cardiovascular: Negative.  Negative for chest pain, palpitations and leg swelling.  Gastrointestinal: Negative.  Negative for nausea, vomiting, abdominal pain, diarrhea, constipation and blood in stool.  Endocrine: Negative.   Genitourinary: Negative.   Negative for difficulty urinating.  Musculoskeletal: Positive for arthralgias. Negative for myalgias, back pain, joint swelling, gait problem and neck pain.  Skin: Negative.  Negative for color change and rash.  Allergic/Immunologic: Negative.   Neurological: Negative.  Negative for dizziness, tremors, weakness and light-headedness.  Hematological: Negative.   Psychiatric/Behavioral: Negative.     Objective:  BP 118/60 mmHg  Pulse 53  Temp(Src) 97.8 F (36.6 C) (Oral)  Resp 16  Ht 5\' 4"  (1.626 m)  Wt 176 lb (79.833 kg)  BMI 30.20 kg/m2  SpO2 97%  BP Readings from Last 3 Encounters:  11/05/15 150/80  10/23/15 118/60  04/02/15 160/78    Wt Readings from Last 3 Encounters:  11/05/15 176 lb (79.833 kg)  10/23/15 176 lb (79.833 kg)  04/02/15 173 lb (78.472 kg)    Physical Exam  Constitutional: She is oriented to person, place, and time. No distress.  HENT:  Mouth/Throat: Oropharynx is clear and moist. No oropharyngeal exudate.  Eyes: Right eye exhibits no discharge. Left eye exhibits no discharge. No scleral icterus.  Neck: Normal range of motion. Neck supple. No JVD present. No tracheal deviation present. No thyromegaly present.  Cardiovascular: Normal rate, regular rhythm, normal heart sounds and intact distal pulses.  Exam reveals no gallop and no friction rub.   No murmur heard. Pulmonary/Chest: Effort normal and breath sounds normal. No stridor. No respiratory distress. She has no wheezes. She has no rales. She exhibits no tenderness.  Abdominal: Soft. Bowel sounds are normal. She exhibits no distension and no  mass. There is no tenderness. There is no rebound and no guarding.  Musculoskeletal: Normal range of motion. She exhibits no edema or tenderness.       Right knee: She exhibits deformity (DJD). She exhibits normal range of motion, no swelling, no effusion and no erythema. No tenderness found.       Left knee: She exhibits deformity (DJD). She exhibits normal range of  motion, no swelling, no effusion and no erythema. No tenderness found.  DJD in both knees  Lymphadenopathy:    She has no cervical adenopathy.  Neurological: She is oriented to person, place, and time.  Skin: Skin is warm and dry. No rash noted. She is not diaphoretic. No erythema. No pallor.  Vitals reviewed.   Lab Results  Component Value Date   WBC 9.9 10/23/2015   HGB 13.6 10/23/2015   HCT 41.0 10/23/2015   PLT 221.0 10/23/2015   GLUCOSE 130* 10/23/2015   CHOL 218* 10/23/2015   TRIG 101.0 10/23/2015   HDL 111.50 10/23/2015   LDLDIRECT 104.5 04/25/2010   LDLCALC 86 10/23/2015   ALT 20 10/23/2015   AST 22 10/23/2015   NA 141 10/23/2015   K 4.3 10/23/2015   CL 102 10/23/2015   CREATININE 0.87 10/23/2015   BUN 15 10/23/2015   CO2 30 10/23/2015   TSH 2.00 10/23/2015   INR 0.92 06/05/2014   HGBA1C 5.5 07/06/2014    Dg Wrist Complete Left  04/02/2015  CLINICAL DATA:  Fall.  Pain and swelling. EXAM: LEFT WRIST - COMPLETE 3+ VIEW COMPARISON:  None. FINDINGS: Diffuse degenerative changes left wrist. Chondrocalcinosis, most likely degenerative. No evidence of fracture or dislocation. IMPRESSION: No acute abnormality.  Diffuse degenerative change. Electronically Signed   By: Marcello Moores  Register   On: 04/02/2015 13:12    Assessment & Plan:   [name] was seen today for annual exam, hyperlipidemia, anemia and osteoarthritis.  Diagnoses and all orders for this visit:  Pernicious anemia- improvement noted -     CBC with Differential/Platelet; Future  Hyperlipidemia with target LDL less than 130- she has achieved her LDL goal -     Lipid panel; Future -     Comprehensive metabolic panel; Future -     TSH; Future  Essential hypertension, benign- her blood pressure is well-controlled, lites and renal function are stable. -     Comprehensive metabolic panel; Future  Need for vaccination with 13-polyvalent pneumococcal conjugate vaccine -     Pneumococcal conjugate vaccine  13-valent  Primary osteoarthritis of both knees -     traMADol (ULTRAM) 50 MG tablet; Take 1 tablet (50 mg total) by mouth every 12 (twelve) hours as needed.  Rheumatoid arthritis involving both shoulders with positive rheumatoid factor (HCC) -     traMADol (ULTRAM) 50 MG tablet; Take 1 tablet (50 mg total) by mouth every 12 (twelve) hours as needed.  Routine health maintenance   I have discontinued Ms. Javed's temazepam and HYDROcodone-acetaminophen. I have also changed her traMADol. Additionally, I am having her maintain her folic acid, methotrexate, Vitamin D, fexofenadine, simvastatin, amLODipine, and atenolol.  Meds ordered this encounter  Medications  . DISCONTD: traMADol (ULTRAM) 50 MG tablet    Sig:   . traMADol (ULTRAM) 50 MG tablet    Sig: Take 1 tablet (50 mg total) by mouth every 12 (twelve) hours as needed.    Dispense:  180 tablet    Refill:  1   See AVS for instructions about healthy living and anticipatory guidance.  Follow-up: Return in about 6 months (around 04/21/2016).  Scarlette Calico, MD

## 2015-10-23 NOTE — Patient Instructions (Signed)
Preventive Care for Adults, Female A healthy lifestyle and preventive care can promote health and wellness. Preventive health guidelines for women include the following key practices.  A routine yearly physical is a good way to check with your health care provider about your health and preventive screening. It is a chance to share any concerns and updates on your health and to receive a thorough exam.  Visit your dentist for a routine exam and preventive care every 6 months. Brush your teeth twice a day and floss once a day. Good oral hygiene prevents tooth decay and gum disease.  The frequency of eye exams is based on your age, health, family medical history, use of contact lenses, and other factors. Follow your health care provider's recommendations for frequency of eye exams.  Eat a healthy diet. Foods like vegetables, fruits, whole grains, low-fat dairy products, and lean protein foods contain the nutrients you need without too many calories. Decrease your intake of foods high in solid fats, added sugars, and salt. Eat the right amount of calories for you.Get information about a proper diet from your health care provider, if necessary.  Regular physical exercise is one of the most important things you can do for your health. Most adults should get at least 150 minutes of moderate-intensity exercise (any activity that increases your heart rate and causes you to sweat) each week. In addition, most adults need muscle-strengthening exercises on 2 or more days a week.  Maintain a healthy weight. The body mass index (BMI) is a screening tool to identify possible weight problems. It provides an estimate of body fat based on height and weight. Your health care provider can find your BMI and can help you achieve or maintain a healthy weight.For adults 20 years and older:  A BMI below 18.5 is considered underweight.  A BMI of 18.5 to 24.9 is normal.  A BMI of 25 to 29.9 is considered overweight.  A  BMI of 30 and above is considered obese.  Maintain normal blood lipids and cholesterol levels by exercising and minimizing your intake of saturated fat. Eat a balanced diet with plenty of fruit and vegetables. Blood tests for lipids and cholesterol should begin at age 45 and be repeated every 5 years. If your lipid or cholesterol levels are high, you are over 50, or you are at high risk for heart disease, you may need your cholesterol levels checked more frequently.Ongoing high lipid and cholesterol levels should be treated with medicines if diet and exercise are not working.  If you smoke, find out from your health care provider how to quit. If you do not use tobacco, do not start.  Lung cancer screening is recommended for adults aged 45-80 years who are at high risk for developing lung cancer because of a history of smoking. A yearly low-dose CT scan of the lungs is recommended for people who have at least a 30-pack-year history of smoking and are a current smoker or have quit within the past 15 years. A pack year of smoking is smoking an average of 1 pack of cigarettes a day for 1 year (for example: 1 pack a day for 30 years or 2 packs a day for 15 years). Yearly screening should continue until the smoker has stopped smoking for at least 15 years. Yearly screening should be stopped for people who develop a health problem that would prevent them from having lung cancer treatment.  If you are pregnant, do not drink alcohol. If you are  breastfeeding, be very cautious about drinking alcohol. If you are not pregnant and choose to drink alcohol, do not have more than 1 drink per day. One drink is considered to be 12 ounces (355 mL) of beer, 5 ounces (148 mL) of wine, or 1.5 ounces (44 mL) of liquor.  Avoid use of street drugs. Do not share needles with anyone. Ask for help if you need support or instructions about stopping the use of drugs.  High blood pressure causes heart disease and increases the risk  of stroke. Your blood pressure should be checked at least every 1 to 2 years. Ongoing high blood pressure should be treated with medicines if weight loss and exercise do not work.  If you are 55-79 years old, ask your health care provider if you should take aspirin to prevent strokes.  Diabetes screening is done by taking a blood sample to check your blood glucose level after you have not eaten for a certain period of time (fasting). If you are not overweight and you do not have risk factors for diabetes, you should be screened once every 3 years starting at age 45. If you are overweight or obese and you are 40-70 years of age, you should be screened for diabetes every year as part of your cardiovascular risk assessment.  Breast cancer screening is essential preventive care for women. You should practice "breast self-awareness." This means understanding the normal appearance and feel of your breasts and may include breast self-examination. Any changes detected, no matter how small, should be reported to a health care provider. Women in their 20s and 30s should have a clinical breast exam (CBE) by a health care provider as part of a regular health exam every 1 to 3 years. After age 40, women should have a CBE every year. Starting at age 40, women should consider having a mammogram (breast X-ray test) every year. Women who have a family history of breast cancer should talk to their health care provider about genetic screening. Women at a high risk of breast cancer should talk to their health care providers about having an MRI and a mammogram every year.  Breast cancer gene (BRCA)-related cancer risk assessment is recommended for women who have family members with BRCA-related cancers. BRCA-related cancers include breast, ovarian, tubal, and peritoneal cancers. Having family members with these cancers may be associated with an increased risk for harmful changes (mutations) in the breast cancer genes BRCA1 and  BRCA2. Results of the assessment will determine the need for genetic counseling and BRCA1 and BRCA2 testing.  Your health care provider may recommend that you be screened regularly for cancer of the pelvic organs (ovaries, uterus, and vagina). This screening involves a pelvic examination, including checking for microscopic changes to the surface of your cervix (Pap test). You may be encouraged to have this screening done every 3 years, beginning at age 21.  For women ages 30-65, health care providers may recommend pelvic exams and Pap testing every 3 years, or they may recommend the Pap and pelvic exam, combined with testing for human papilloma virus (HPV), every 5 years. Some types of HPV increase your risk of cervical cancer. Testing for HPV may also be done on women of any age with unclear Pap test results.  Other health care providers may not recommend any screening for nonpregnant women who are considered low risk for pelvic cancer and who do not have symptoms. Ask your health care provider if a screening pelvic exam is right for   you.  If you have had past treatment for cervical cancer or a condition that could lead to cancer, you need Pap tests and screening for cancer for at least 20 years after your treatment. If Pap tests have been discontinued, your risk factors (such as having a new sexual partner) need to be reassessed to determine if screening should resume. Some women have medical problems that increase the chance of getting cervical cancer. In these cases, your health care provider may recommend more frequent screening and Pap tests.  Colorectal cancer can be detected and often prevented. Most routine colorectal cancer screening begins at the age of 50 years and continues through age 75 years. However, your health care provider may recommend screening at an earlier age if you have risk factors for colon cancer. On a yearly basis, your health care provider may provide home test kits to check  for hidden blood in the stool. Use of a small camera at the end of a tube, to directly examine the colon (sigmoidoscopy or colonoscopy), can detect the earliest forms of colorectal cancer. Talk to your health care provider about this at age 50, when routine screening begins. Direct exam of the colon should be repeated every 5-10 years through age 75 years, unless early forms of precancerous polyps or small growths are found.  People who are at an increased risk for hepatitis B should be screened for this virus. You are considered at high risk for hepatitis B if:  You were born in a country where hepatitis B occurs often. Talk with your health care provider about which countries are considered high risk.  Your parents were born in a high-risk country and you have not received a shot to protect against hepatitis B (hepatitis B vaccine).  You have HIV or AIDS.  You use needles to inject street drugs.  You live with, or have sex with, someone who has hepatitis B.  You get hemodialysis treatment.  You take certain medicines for conditions like cancer, organ transplantation, and autoimmune conditions.  Hepatitis C blood testing is recommended for all people born from 1945 through 1965 and any individual with known risks for hepatitis C.  Practice safe sex. Use condoms and avoid high-risk sexual practices to reduce the spread of sexually transmitted infections (STIs). STIs include gonorrhea, chlamydia, syphilis, trichomonas, herpes, HPV, and human immunodeficiency virus (HIV). Herpes, HIV, and HPV are viral illnesses that have no cure. They can result in disability, cancer, and death.  You should be screened for sexually transmitted illnesses (STIs) including gonorrhea and chlamydia if:  You are sexually active and are younger than 24 years.  You are older than 24 years and your health care provider tells you that you are at risk for this type of infection.  Your sexual activity has changed  since you were last screened and you are at an increased risk for chlamydia or gonorrhea. Ask your health care provider if you are at risk.  If you are at risk of being infected with HIV, it is recommended that you take a prescription medicine daily to prevent HIV infection. This is called preexposure prophylaxis (PrEP). You are considered at risk if:  You are sexually active and do not regularly use condoms or know the HIV status of your partner(s).  You take drugs by injection.  You are sexually active with a partner who has HIV.  Talk with your health care provider about whether you are at high risk of being infected with HIV. If   you choose to begin PrEP, you should first be tested for HIV. You should then be tested every 3 months for as long as you are taking PrEP.  Osteoporosis is a disease in which the bones lose minerals and strength with aging. This can result in serious bone fractures or breaks. The risk of osteoporosis can be identified using a bone density scan. Women ages 67 years and over and women at risk for fractures or osteoporosis should discuss screening with their health care providers. Ask your health care provider whether you should take a calcium supplement or vitamin D to reduce the rate of osteoporosis.  Menopause can be associated with physical symptoms and risks. Hormone replacement therapy is available to decrease symptoms and risks. You should talk to your health care provider about whether hormone replacement therapy is right for you.  Use sunscreen. Apply sunscreen liberally and repeatedly throughout the day. You should seek shade when your shadow is shorter than you. Protect yourself by wearing long sleeves, pants, a wide-brimmed hat, and sunglasses year round, whenever you are outdoors.  Once a month, do a whole body skin exam, using a mirror to look at the skin on your back. Tell your health care provider of new moles, moles that have irregular borders, moles that  are larger than a pencil eraser, or moles that have changed in shape or color.  Stay current with required vaccines (immunizations).  Influenza vaccine. All adults should be immunized every year.  Tetanus, diphtheria, and acellular pertussis (Td, Tdap) vaccine. Pregnant women should receive 1 dose of Tdap vaccine during each pregnancy. The dose should be obtained regardless of the length of time since the last dose. Immunization is preferred during the 27th-36th week of gestation. An adult who has not previously received Tdap or who does not know her vaccine status should receive 1 dose of Tdap. This initial dose should be followed by tetanus and diphtheria toxoids (Td) booster doses every 10 years. Adults with an unknown or incomplete history of completing a 3-dose immunization series with Td-containing vaccines should begin or complete a primary immunization series including a Tdap dose. Adults should receive a Td booster every 10 years.  Varicella vaccine. An adult without evidence of immunity to varicella should receive 2 doses or a second dose if she has previously received 1 dose. Pregnant females who do not have evidence of immunity should receive the first dose after pregnancy. This first dose should be obtained before leaving the health care facility. The second dose should be obtained 4-8 weeks after the first dose.  Human papillomavirus (HPV) vaccine. Females aged 13-26 years who have not received the vaccine previously should obtain the 3-dose series. The vaccine is not recommended for use in pregnant females. However, pregnancy testing is not needed before receiving a dose. If a female is found to be pregnant after receiving a dose, no treatment is needed. In that case, the remaining doses should be delayed until after the pregnancy. Immunization is recommended for any person with an immunocompromised condition through the age of 61 years if she did not get any or all doses earlier. During the  3-dose series, the second dose should be obtained 4-8 weeks after the first dose. The third dose should be obtained 24 weeks after the first dose and 16 weeks after the second dose.  Zoster vaccine. One dose is recommended for adults aged 30 years or older unless certain conditions are present.  Measles, mumps, and rubella (MMR) vaccine. Adults born  before 1957 generally are considered immune to measles and mumps. Adults born in 1957 or later should have 1 or more doses of MMR vaccine unless there is a contraindication to the vaccine or there is laboratory evidence of immunity to each of the three diseases. A routine second dose of MMR vaccine should be obtained at least 28 days after the first dose for students attending postsecondary schools, health care workers, or international travelers. People who received inactivated measles vaccine or an unknown type of measles vaccine during 1963-1967 should receive 2 doses of MMR vaccine. People who received inactivated mumps vaccine or an unknown type of mumps vaccine before 1979 and are at high risk for mumps infection should consider immunization with 2 doses of MMR vaccine. For females of childbearing age, rubella immunity should be determined. If there is no evidence of immunity, females who are not pregnant should be vaccinated. If there is no evidence of immunity, females who are pregnant should delay immunization until after pregnancy. Unvaccinated health care workers born before 1957 who lack laboratory evidence of measles, mumps, or rubella immunity or laboratory confirmation of disease should consider measles and mumps immunization with 2 doses of MMR vaccine or rubella immunization with 1 dose of MMR vaccine.  Pneumococcal 13-valent conjugate (PCV13) vaccine. When indicated, a person who is uncertain of his immunization history and has no record of immunization should receive the PCV13 vaccine. All adults 65 years of age and older should receive this  vaccine. An adult aged 19 years or older who has certain medical conditions and has not been previously immunized should receive 1 dose of PCV13 vaccine. This PCV13 should be followed with a dose of pneumococcal polysaccharide (PPSV23) vaccine. Adults who are at high risk for pneumococcal disease should obtain the PPSV23 vaccine at least 8 weeks after the dose of PCV13 vaccine. Adults older than 80 years of age who have normal immune system function should obtain the PPSV23 vaccine dose at least 1 year after the dose of PCV13 vaccine.  Pneumococcal polysaccharide (PPSV23) vaccine. When PCV13 is also indicated, PCV13 should be obtained first. All adults aged 65 years and older should be immunized. An adult younger than age 65 years who has certain medical conditions should be immunized. Any person who resides in a nursing home or long-term care facility should be immunized. An adult smoker should be immunized. People with an immunocompromised condition and certain other conditions should receive both PCV13 and PPSV23 vaccines. People with human immunodeficiency virus (HIV) infection should be immunized as soon as possible after diagnosis. Immunization during chemotherapy or radiation therapy should be avoided. Routine use of PPSV23 vaccine is not recommended for American Indians, [place] Natives, or people younger than 65 years unless there are medical conditions that require PPSV23 vaccine. When indicated, people who have unknown immunization and have no record of immunization should receive PPSV23 vaccine. One-time revaccination 5 years after the first dose of PPSV23 is recommended for people aged 19-64 years who have chronic kidney failure, nephrotic syndrome, asplenia, or immunocompromised conditions. People who received 1-2 doses of PPSV23 before age 65 years should receive another dose of PPSV23 vaccine at age 65 years or later if at least 5 years have passed since the previous dose. Doses of PPSV23 are not  needed for people immunized with PPSV23 at or after age 65 years.  Meningococcal vaccine. Adults with asplenia or persistent complement component deficiencies should receive 2 doses of quadrivalent meningococcal conjugate (MenACWY-D) vaccine. The doses should be obtained   at least 2 months apart. Microbiologists working with certain meningococcal bacteria, [place] recruits, people at risk during an outbreak, and people who travel to or live in countries with a high rate of meningitis should be immunized. A first-year college student up through age 34 years who is living in a residence hall should receive a dose if she did not receive a dose on or after her 16th birthday. Adults who have certain high-risk conditions should receive one or more doses of vaccine.  Hepatitis A vaccine. Adults who wish to be protected from this disease, have certain high-risk conditions, work with hepatitis A-infected animals, work in hepatitis A research labs, or travel to or work in countries with a high rate of hepatitis A should be immunized. Adults who were previously unvaccinated and who anticipate close contact with an international adoptee during the first 60 days after arrival in the [place] from a country with a high rate of hepatitis A should be immunized.  Hepatitis B vaccine. Adults who wish to be protected from this disease, have certain high-risk conditions, may be exposed to blood or other infectious body fluids, are household contacts or sex partners of hepatitis B positive people, are clients or workers in certain care facilities, or travel to or work in countries with a high rate of hepatitis B should be immunized.  Haemophilus influenzae type b (Hib) vaccine. A previously unvaccinated person with asplenia or sickle cell disease or having a scheduled splenectomy should receive 1 dose of Hib vaccine. Regardless of previous immunization, a recipient of a hematopoietic stem cell transplant should receive a  3-dose series 6-12 months after her successful transplant. Hib vaccine is not recommended for adults with HIV infection. Preventive Services / Frequency Ages 35 to 4 years  Blood pressure check.** / Every 3-5 years.  Lipid and cholesterol check.** / Every 5 years beginning at age 60.  Clinical breast exam.** / Every 3 years for women in their 71s and 10s.  BRCA-related cancer risk assessment.** / For women who have family members with a BRCA-related cancer (breast, ovarian, tubal, or peritoneal cancers).  Pap test.** / Every 2 years from ages 76 through 26. Every 3 years starting at age 61 through age 76 or 93 with a history of 3 consecutive normal Pap tests.  HPV screening.** / Every 3 years from ages 37 through ages 60 to 51 with a history of 3 consecutive normal Pap tests.  Hepatitis C blood test.** / For any individual with known risks for hepatitis C.  Skin self-exam. / Monthly.  Influenza vaccine. / Every year.  Tetanus, diphtheria, and acellular pertussis (Tdap, Td) vaccine.** / Consult your health care provider. Pregnant women should receive 1 dose of Tdap vaccine during each pregnancy. 1 dose of Td every 10 years.  Varicella vaccine.** / Consult your health care provider. Pregnant females who do not have evidence of immunity should receive the first dose after pregnancy.  HPV vaccine. / 3 doses over 6 months, if 93 and younger. The vaccine is not recommended for use in pregnant females. However, pregnancy testing is not needed before receiving a dose.  Measles, mumps, rubella (MMR) vaccine.** / You need at least 1 dose of MMR if you were born in 1957 or later. You may also need a 2nd dose. For females of childbearing age, rubella immunity should be determined. If there is no evidence of immunity, females who are not pregnant should be vaccinated. If there is no evidence of immunity, females who are  pregnant should delay immunization until after pregnancy.  Pneumococcal  13-valent conjugate (PCV13) vaccine.** / Consult your health care provider.  Pneumococcal polysaccharide (PPSV23) vaccine.** / 1 to 2 doses if you smoke cigarettes or if you have certain conditions.  Meningococcal vaccine.** / 1 dose if you are age 68 to 8 years and a Market researcher living in a residence hall, or have one of several medical conditions, you need to get vaccinated against meningococcal disease. You may also need additional booster doses.  Hepatitis A vaccine.** / Consult your health care provider.  Hepatitis B vaccine.** / Consult your health care provider.  Haemophilus influenzae type b (Hib) vaccine.** / Consult your health care provider. Ages 7 to 53 years  Blood pressure check.** / Every year.  Lipid and cholesterol check.** / Every 5 years beginning at age 25 years.  Lung cancer screening. / Every year if you are aged 11-80 years and have a 30-pack-year history of smoking and currently smoke or have quit within the past 15 years. Yearly screening is stopped once you have quit smoking for at least 15 years or develop a health problem that would prevent you from having lung cancer treatment.  Clinical breast exam.** / Every year after age 48 years.  BRCA-related cancer risk assessment.** / For women who have family members with a BRCA-related cancer (breast, ovarian, tubal, or peritoneal cancers).  Mammogram.** / Every year beginning at age 41 years and continuing for as long as you are in good health. Consult with your health care provider.  Pap test.** / Every 3 years starting at age 65 years through age 37 or 70 years with a history of 3 consecutive normal Pap tests.  HPV screening.** / Every 3 years from ages 72 years through ages 60 to 40 years with a history of 3 consecutive normal Pap tests.  Fecal occult blood test (FOBT) of stool. / Every year beginning at age 21 years and continuing until age 5 years. You may not need to do this test if you get  a colonoscopy every 10 years.  Flexible sigmoidoscopy or colonoscopy.** / Every 5 years for a flexible sigmoidoscopy or every 10 years for a colonoscopy beginning at age 35 years and continuing until age 48 years.  Hepatitis C blood test.** / For all people born from [birth year] through 1965 and any individual with known risks for hepatitis C.  Skin self-exam. / Monthly.  Influenza vaccine. / Every year.  Tetanus, diphtheria, and acellular pertussis (Tdap/Td) vaccine.** / Consult your health care provider. Pregnant women should receive 1 dose of Tdap vaccine during each pregnancy. 1 dose of Td every 10 years.  Varicella vaccine.** / Consult your health care provider. Pregnant females who do not have evidence of immunity should receive the first dose after pregnancy.  Zoster vaccine.** / 1 dose for adults aged 30 years or older.  Measles, mumps, rubella (MMR) vaccine.** / You need at least 1 dose of MMR if you were born in 1957 or later. You may also need a second dose. For females of childbearing age, rubella immunity should be determined. If there is no evidence of immunity, females who are not pregnant should be vaccinated. If there is no evidence of immunity, females who are pregnant should delay immunization until after pregnancy.  Pneumococcal 13-valent conjugate (PCV13) vaccine.** / Consult your health care provider.  Pneumococcal polysaccharide (PPSV23) vaccine.** / 1 to 2 doses if you smoke cigarettes or if you have certain conditions.  Meningococcal vaccine.** /  Consult your health care provider.  Hepatitis A vaccine.** / Consult your health care provider.  Hepatitis B vaccine.** / Consult your health care provider.  Haemophilus influenzae type b (Hib) vaccine.** / Consult your health care provider. Ages 64 years and over  Blood pressure check.** / Every year.  Lipid and cholesterol check.** / Every 5 years beginning at age 23 years.  Lung cancer screening. / Every year if you  are aged 16-80 years and have a 30-pack-year history of smoking and currently smoke or have quit within the past 15 years. Yearly screening is stopped once you have quit smoking for at least 15 years or develop a health problem that would prevent you from having lung cancer treatment.  Clinical breast exam.** / Every year after age 74 years.  BRCA-related cancer risk assessment.** / For women who have family members with a BRCA-related cancer (breast, ovarian, tubal, or peritoneal cancers).  Mammogram.** / Every year beginning at age 44 years and continuing for as long as you are in good health. Consult with your health care provider.  Pap test.** / Every 3 years starting at age 58 years through age 22 or 39 years with 3 consecutive normal Pap tests. Testing can be stopped between 65 and 70 years with 3 consecutive normal Pap tests and no abnormal Pap or HPV tests in the past 10 years.  HPV screening.** / Every 3 years from ages 64 years through ages 70 or 61 years with a history of 3 consecutive normal Pap tests. Testing can be stopped between 65 and 70 years with 3 consecutive normal Pap tests and no abnormal Pap or HPV tests in the past 10 years.  Fecal occult blood test (FOBT) of stool. / Every year beginning at age 40 years and continuing until age 27 years. You may not need to do this test if you get a colonoscopy every 10 years.  Flexible sigmoidoscopy or colonoscopy.** / Every 5 years for a flexible sigmoidoscopy or every 10 years for a colonoscopy beginning at age 7 years and continuing until age 32 years.  Hepatitis C blood test.** / For all people born from [birth year] through 1965 and any individual with known risks for hepatitis C.  Osteoporosis screening.** / A one-time screening for women ages 30 years and over and women at risk for fractures or osteoporosis.  Skin self-exam. / Monthly.  Influenza vaccine. / Every year.  Tetanus, diphtheria, and acellular pertussis (Tdap/Td)  vaccine.** / 1 dose of Td every 10 years.  Varicella vaccine.** / Consult your health care provider.  Zoster vaccine.** / 1 dose for adults aged 35 years or older.  Pneumococcal 13-valent conjugate (PCV13) vaccine.** / Consult your health care provider.  Pneumococcal polysaccharide (PPSV23) vaccine.** / 1 dose for all adults aged 46 years and older.  Meningococcal vaccine.** / Consult your health care provider.  Hepatitis A vaccine.** / Consult your health care provider.  Hepatitis B vaccine.** / Consult your health care provider.  Haemophilus influenzae type b (Hib) vaccine.** / Consult your health care provider. ** Family history and personal history of risk and conditions may change your health care provider's recommendations.   This information is not intended to replace advice given to you by your health care provider. Make sure you discuss any questions you have with your health care provider.   Document Released: 11/24/2001 Document Revised: 10/19/2014 Document Reviewed: 02/23/2011 Elsevier Interactive Patient Education [employer].

## 2015-10-23 NOTE — Progress Notes (Signed)
Pre visit review using our clinic review tool, if applicable. No additional management support is needed unless otherwise documented below in the visit note. 

## 2015-10-24 ENCOUNTER — Telehealth: Payer: Self-pay | Admitting: Internal Medicine

## 2015-10-24 DIAGNOSIS — M0609 Rheumatoid arthritis without rheumatoid factor, multiple sites: Secondary | ICD-10-CM | POA: Diagnosis not present

## 2015-10-24 DIAGNOSIS — Z79899 Other long term (current) drug therapy: Secondary | ICD-10-CM | POA: Diagnosis not present

## 2015-10-24 DIAGNOSIS — M2550 Pain in unspecified joint: Secondary | ICD-10-CM | POA: Diagnosis not present

## 2015-10-24 DIAGNOSIS — M150 Primary generalized (osteo)arthritis: Secondary | ICD-10-CM | POA: Diagnosis not present

## 2015-10-24 NOTE — Assessment & Plan Note (Signed)

## 2015-10-24 NOTE — Telephone Encounter (Signed)
Needs most recent labs faxed over to [phone]

## 2015-10-25 NOTE — Telephone Encounter (Signed)
Done

## 2015-10-28 ENCOUNTER — Telehealth: Payer: Self-pay | Admitting: Internal Medicine

## 2015-10-28 NOTE — Telephone Encounter (Signed)
Please call patient back with lab results

## 2015-10-28 NOTE — Telephone Encounter (Signed)
Pt informed. lette sent out as well

## 2015-11-05 ENCOUNTER — Encounter: Payer: Self-pay | Admitting: Internal Medicine

## 2015-11-05 ENCOUNTER — Ambulatory Visit (INDEPENDENT_AMBULATORY_CARE_PROVIDER_SITE_OTHER): Payer: Commercial Managed Care - HMO | Admitting: Internal Medicine

## 2015-11-05 VITALS — BP 150/80 | HR 64 | Temp 98.2°F | Resp 12 | Ht 64.0 in | Wt 176.0 lb

## 2015-11-05 DIAGNOSIS — M542 Cervicalgia: Secondary | ICD-10-CM | POA: Diagnosis not present

## 2015-11-05 MED ORDER — CYCLOBENZAPRINE HCL 10 MG PO TABS: 10.0000 mg | ORAL_TABLET | Freq: Three times a day (TID) | ORAL | Status: DC | PRN

## 2015-11-05 NOTE — Assessment & Plan Note (Signed)
Has had episodes of this in the past. Tramadol not effective at all so will use muscle relaxer since she is sore over the muscles. No injury or tenderness over the cervical spine so no indication for cervical imaging although if no resolution would be warranted as she has concomitant RA.

## 2015-11-05 NOTE — Patient Instructions (Signed)
We have sent in the strong muscle relaxer sent in called flexeril which should help with the pain.   This is muscle pain and should disappear over the next couple of weeks.

## 2015-11-05 NOTE — Progress Notes (Signed)
Pre visit review using our clinic review tool, if applicable. No additional management support is needed unless otherwise documented below in the visit note. 

## 2015-11-05 NOTE — Progress Notes (Signed)
   Subjective:    Patient ID: Briana [name], female    DOB: 1931/09/02, 82 y.o.   MRN: [id number]  HPI The patient is an 82 YO female coming in for neck pain since Saturday. She noticed it when she woke up. She is not sure if she was doing any extra activities that would put strain on her neck but she thinks she might have been. No fevers or chills. No sick and no congestion. She does have rheumatoid arthritis and denies falls or injury to her neck. Has taken the tramadol she has at home which did not help. Heat helps but stops working when she removes the heat. Tried tylenol without relief. Is not able to take NSAIDs. No numbness or radiation of the pain.  Review of Systems  Constitutional: Positive for activity change. Negative for fever, chills, appetite change, fatigue and unexpected weight change.  Respiratory: Negative for cough, chest tightness, shortness of breath and wheezing.   Cardiovascular: Negative for chest pain, palpitations and leg swelling.  Gastrointestinal: Negative.   Musculoskeletal: Positive for neck pain and neck stiffness. Negative for back pain.  Neurological: Negative.       Objective:   Physical Exam  Constitutional: She is oriented to person, place, and time. She appears well-developed and well-nourished.  HENT:  Head: Normocephalic and atraumatic.  Eyes: EOM are normal.  Neck: No JVD present. No thyromegaly present.  ROM limited by pain slightly  Musculoskeletal: She exhibits tenderness.  Tenderness in the lateral muscles in the neck, no tenderness over the cervical spine.   Lymphadenopathy:    She has no cervical adenopathy.  Neurological: She is alert and oriented to person, place, and time. Coordination normal.   Filed Vitals:   11/05/15 0810  BP: 150/80  Pulse: 64  Temp: 98.2 F (36.8 C)  TempSrc: Oral  Resp: 12  Height: 5\' 4"  (1.626 m)  Weight: 176 lb (79.833 kg)  SpO2: 98%      Assessment & Plan:

## 2015-12-09 ENCOUNTER — Telehealth: Payer: Self-pay | Admitting: Internal Medicine

## 2015-12-09 NOTE — Telephone Encounter (Signed)
Patient Name: Briana [name] DOB: Aug 08, 1931 Initial Comment Caller states her neck is bothering her really bad. Nurse Assessment Nurse: Ronnald Ramp, RN, Miranda Date/Time (Eastern Time): 12/09/2015 12:50:56 PM Confirm and document reason for call. If symptomatic, describe symptoms. You must click the next button to save text entered. ---Caller states she is having another episode of neck pain on Saturday. No specific injury. Has the patient traveled out of the country within the last 30 days? ---Not Applicable Does the patient have any new or worsening symptoms? ---Yes Will a triage be completed? ---Yes Related visit to physician within the last 2 weeks? ---No Does the PT have any chronic conditions? (i.e. diabetes, asthma, etc.) ---Yes List chronic conditions. ---RA, HTN, High Cholesterol Is this a behavioral health or substance abuse call? ---No Guidelines Guideline Title Affirmed Question Affirmed Notes Neck Pain or Stiffness [1] MODERATE neck pain (e.g., interferes with normal activities AND [2] present > 3 days Final Disposition User See PCP When Office is Open (within 3 days) Ronnald Ramp, RN, Miranda Comments No appt available at PCP office. Appt scheduled with Dorothyann Peng at [facility] for tomorrow, 2/28, at 10am. Also reminded pt to take her prescribed pain medication and muscle relaxer as directed. Referrals GO TO FACILITY OTHER - SPECIFY Disagree/Comply: Comply

## 2015-12-10 ENCOUNTER — Ambulatory Visit (INDEPENDENT_AMBULATORY_CARE_PROVIDER_SITE_OTHER)
Admission: RE | Admit: 2015-12-10 | Discharge: 2015-12-10 | Disposition: A | Discharge status: Home / Self Care | Payer: Commercial Managed Care - HMO | Source: Ambulatory Visit | Attending: Adult Health | Admitting: Adult Health

## 2015-12-10 ENCOUNTER — Ambulatory Visit (INDEPENDENT_AMBULATORY_CARE_PROVIDER_SITE_OTHER): Payer: Commercial Managed Care - HMO | Admitting: Adult Health

## 2015-12-10 DIAGNOSIS — M542 Cervicalgia: Secondary | ICD-10-CM | POA: Diagnosis not present

## 2015-12-10 DIAGNOSIS — M47812 Spondylosis without myelopathy or radiculopathy, cervical region: Secondary | ICD-10-CM | POA: Diagnosis not present

## 2015-12-10 MED ORDER — TIZANIDINE HCL 4 MG PO TABS: 4.0000 mg | ORAL_TABLET | Freq: Four times a day (QID) | ORAL | Status: DC | PRN

## 2015-12-10 MED ORDER — METHYLPREDNISOLONE ACETATE 80 MG/ML IJ SUSP
80.0000 mg | Freq: Once | INTRAMUSCULAR | Status: AC
  Administered 2015-12-10: 80 mg via INTRAMUSCULAR

## 2015-12-10 NOTE — Progress Notes (Signed)
Pre visit review using our clinic review tool, if applicable. No additional management support is needed unless otherwise documented below in the visit note. 

## 2015-12-10 NOTE — Patient Instructions (Signed)
Discontinue the Flexeril.   I have sent in a prescription for Zanaflex - this is a different muscle relaxer.   I have also put in an order for a x ray of your neck. Go to the [facility] office for this.   Follow up with Dr. Sharlet Salina as needed

## 2015-12-10 NOTE — Progress Notes (Signed)
   Subjective:    Patient ID: Briana [name], female    DOB: [birth year], 82 y.o.   MRN: [id number]  HPI The patient is an 82 year old female coming in for neck pain since Saturday. She noticed it when she woke up.  No fevers or chills. No sick and no congestion. She does have rheumatoid arthritis and denies falls or injury to her neck. Has taken the tramadol she has at home which did not help. Heat helps but stops working when she removes the heat. Tried tylenol without relief. Is not able to take NSAIDs. No numbness or radiation of the pain.  She saw Dr. Sharlet Salina on 11/05/2015 and was prescribed Flexeril. She reports that the Flexeril has not been helping.   Per Dr. Nathanial Millman notes, she did not think imaging was necessary at that time of initial evaluation but felt as though it would be warranted if her symptoms did not resolve. I will order plain films today.    Review of Systems  Constitutional: Negative.   Respiratory: Negative.   Cardiovascular: Negative.   Musculoskeletal: Positive for myalgias, neck pain and neck stiffness. Negative for back pain.  All other systems reviewed and are negative.      Objective:   Physical Exam  Constitutional: She is oriented to person, place, and time. She appears well-developed and well-nourished. No distress.  Neck: Neck supple. Muscular tenderness present. No spinous process tenderness present. No rigidity. Decreased range of motion (unable to bring chin to chest or ear to shoulder without pain) present. No edema and no erythema present.  Tenderness in the lateral muscles in the neck, no tenderness over the cervical spine.   Cardiovascular: Normal rate, regular rhythm, normal heart sounds and intact distal pulses.  Exam reveals no gallop and no friction rub.   No murmur heard. Pulmonary/Chest: Effort normal and breath sounds normal. No respiratory distress. She has no wheezes. She has no rales. She exhibits no tenderness.  Lymphadenopathy:   She has no cervical adenopathy.  Neurological: She is alert and oriented to person, place, and time.  Skin: Skin is warm and dry. No rash noted. She is not diaphoretic. No erythema. No pallor.  Psychiatric: She has a normal mood and affect. Her behavior is normal. Judgment and thought content normal.  Nursing note and vitals reviewed.     Assessment & Plan:  1. Neck pain, bilateral - methylPREDNISolone acetate (DEPO-MEDROL) injection 80 mg; Inject 1 mL (80 mg total) into the muscle once. - tiZANidine (ZANAFLEX) 4 MG tablet; Take 1 tablet (4 mg total) by mouth every 6 (six) hours as needed for muscle spasms.  Dispense: 30 tablet; Refill: 0 - DG Cervical Spine Complete; Future - D/c Flexeril

## 2015-12-20 ENCOUNTER — Ambulatory Visit: Payer: Commercial Managed Care - HMO | Admitting: Family

## 2016-01-16 ENCOUNTER — Ambulatory Visit (INDEPENDENT_AMBULATORY_CARE_PROVIDER_SITE_OTHER): Payer: Commercial Managed Care - HMO | Admitting: Adult Health

## 2016-01-16 ENCOUNTER — Encounter: Payer: Self-pay | Admitting: Adult Health

## 2016-01-16 VITALS — BP 150/62 | Temp 98.1°F | Wt 174.1 lb

## 2016-01-16 DIAGNOSIS — H6123 Impacted cerumen, bilateral: Secondary | ICD-10-CM | POA: Diagnosis not present

## 2016-01-16 DIAGNOSIS — R05 Cough: Secondary | ICD-10-CM | POA: Diagnosis not present

## 2016-01-16 DIAGNOSIS — H6593 Unspecified nonsuppurative otitis media, bilateral: Secondary | ICD-10-CM | POA: Diagnosis not present

## 2016-01-16 DIAGNOSIS — R059 Cough, unspecified: Secondary | ICD-10-CM

## 2016-01-16 MED ORDER — BENZONATATE 200 MG PO CAPS: 200.0000 mg | ORAL_CAPSULE | Freq: Three times a day (TID) | ORAL | Status: DC | PRN

## 2016-01-16 NOTE — Patient Instructions (Signed)
It was great seeing you!   Your exam reveals fluid behind the ear. It does not appear to be an ear infection. Use Flonase ( can buy this over the counter) twice a day and this will help get rid of the fluid behind the ears.   I have also sent in a prescription for Tessalon Pearls, this will help with the cough. You can also use Mucinex Cough instead of the Tessalon Pearls.

## 2016-01-16 NOTE — Progress Notes (Signed)
Subjective:    Patient ID: Briana [name], female    DOB: 1931-03-18, 82 y.o.   MRN: [id number]  HPI  82 year old female who presents to the office today for " head congestion and productive cough" she has had these symptoms for two days. Denies any fever, nausea, vomiting, diarrhea.   Has tried Tylenol and benadryl without relief.   Review of Systems  Constitutional: Negative.   HENT: Negative.   Respiratory: Negative.   Cardiovascular: Negative.   Neurological: Negative.   All other systems reviewed and are negative.  Past Medical History  Diagnosis Date  . ACUTE URIS OF UNSPECIFIED SITE 11/26/2008  . ALLERGIC RHINITIS 10/28/2007  . ARTHRITIS, KNEES, BILATERAL 10/28/2007  . BACK PAIN 04/12/2009  . BUNIONECTOMY, HX OF 10/28/2007  . CELLULITIS&ABSCESS OF HAND EXCEPT FINGERS&THUMB 10/23/2010  . HYPERLIPIDEMIA 10/28/2007  . HYPERTENSION 10/28/2007  . INSOMNIA UNSPECIFIED 10/31/2007  . MYOSITIS 10/22/2010  . NECK PAIN 12/03/2008  . Pain in joint, site unspecified 10/07/2010  . WRIST PAIN, LEFT 10/22/2010  . ALLERGIC ARTHRITIS OTHER SPECIFIED SITES 10/22/2010    OA + RA  . Complication of anesthesia     itching and makes me crazy  . Family history of anesthesia complication     DAUGHTER HAD HIVES AND " WENT WILD "    Social History   Social History  . Marital Status: Widowed    Spouse Name: N/A  . Number of Children: 4  . Years of Education: 12   Occupational History  . 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 Smoking status: Never Smoker   . Smokeless tobacco: Never Used  . Alcohol Use: No  . Drug Use: No  . Sexual Activity: Not Currently   Other Topics Concern  . 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    Past Surgical History  Procedure Laterality Date  . Arthroscopic  surgery and rotator cuff repair left shoulder    . Arthroscopy right knee hx of surgery    . Hand surgery for broken finger, remote    . Bunionectomy      hx of  . Foot surgery      RIGHT  . Total knee arthroplasty  07/01/2012    Procedure: TOTAL KNEE ARTHROPLASTY;  Surgeon: Yvette Rack., MD;  Location: [facility];  Service: Orthopedics;  Laterality: Right;  right total knee arthroplasty  . Joint replacement Right     knee  . Total knee arthroplasty Left 06/13/2014    dr Percell Miller  . Total knee arthroplasty Left 06/13/2014    Procedure: TOTAL KNEE ARTHROPLASTY;  Surgeon: Ninetta Lights, MD;  Location: [facility];  Service: Orthopedics;  Laterality: Left;    Family History  Problem Relation Age of Onset  . Cancer Brother     lung cancer  . Coronary artery disease Other   . Heart attack Other     Allergies  Allergen Reactions  . Ibuprofen Anaphylaxis  . Percocet [Oxycodone-Acetaminophen]     nausea  . Diltiazem Hcl     REACTION: ANGIO EDEMA  . Hydrochlorothiazide     REACTION: SWELLING  . Lisinopril     REACTION: SWELLING  . Verapamil  REACTION: SWELLING    Current Outpatient Prescriptions on File Prior to Visit  Medication Sig Dispense Refill  . amLODipine (NORVASC) 10 MG tablet TAKE 1 TABLET EVERY DAY 90 tablet 2  . atenolol (TENORMIN) 100 MG tablet TAKE 1 TABLET EVERY DAY 90 tablet 2  . Cholecalciferol (VITAMIN D) 2000 UNITS tablet Take 2,000 Units by mouth daily.    . fexofenadine (ALLEGRA) 180 MG tablet Take 180 mg by mouth daily.    . folic acid (FOLVITE) 1 MG tablet Take 1 mg by mouth daily.    . methotrexate (RHEUMATREX) 2.5 MG tablet Take 20 mg by mouth once a week. Caution:Chemotherapy. Protect from light.  Taken on Fridays.    . simvastatin (ZOCOR) 40 MG tablet Take 1 tablet (40 mg total) by mouth every evening. 90 tablet 2  . tiZANidine (ZANAFLEX) 4 MG tablet Take 1 tablet (4 mg total) by mouth every 6 (six) hours as needed for muscle spasms. 30 tablet 0  . traMADol  (ULTRAM) 50 MG tablet Take 1 tablet (50 mg total) by mouth every 12 (twelve) hours as needed. 180 tablet 1   No current facility-administered medications on file prior to visit.    BP 150/62 mmHg  Temp(Src) 98.1 F (36.7 C) (Oral)  Wt 174 lb 1.6 oz (78.971 kg)       Objective:   Physical Exam  Constitutional: She is oriented to person, place, and time. She appears well-developed and well-nourished. No distress.  HENT:  Head: Normocephalic and atraumatic.  Right Ear: Hearing and external ear normal.  Left Ear: Hearing and external ear normal.  Nose: Nose normal. No mucosal edema or rhinorrhea.  Mouth/Throat: Uvula is midline and oropharynx is clear and moist. No oropharyngeal exudate.  Cerumen impaction in bilateral ears  Eyes: Conjunctivae and EOM are normal. Pupils are equal, round, and reactive to light. Right eye exhibits no discharge. Left eye exhibits no discharge. No scleral icterus.  Cardiovascular: Normal rate, regular rhythm, normal heart sounds and intact distal pulses.  Exam reveals no gallop and no friction rub.   No murmur heard. Pulmonary/Chest: Effort normal and breath sounds normal. No respiratory distress. She has no wheezes. She has no rales. She exhibits no tenderness.  Musculoskeletal: Normal range of motion. She exhibits no edema or tenderness.  Neurological: She is alert and oriented to person, place, and time.  Skin: Skin is warm and dry. No rash noted. She is not diaphoretic. No erythema. No pallor.  Psychiatric: She has a normal mood and affect. Her behavior is normal. Judgment and thought content normal.  Nursing note and vitals reviewed.      Assessment & Plan:  1. Cough - Likely from seasonal allergies. No concern for bronchitis or pneumonia.  - benzonatate (TESSALON) 200 MG capsule; Take 1 capsule (200 mg total) by mouth 3 (three) times daily as needed for cough.  Dispense: 20 capsule; Refill: 0 - Can also use Mucinex Cough  2. SOM (secretory  otitis media), bilateral - No signs of infection.  - Flonase twice a day for 7 days  3. Impacted cerumen of both ears - Cerumen was easily disimpacted with water irrigation. There was no signs of infection    Dorothyann Peng, NP

## 2016-01-21 ENCOUNTER — Other Ambulatory Visit: Payer: Self-pay | Admitting: Internal Medicine

## 2016-01-22 DIAGNOSIS — M542 Cervicalgia: Secondary | ICD-10-CM | POA: Diagnosis not present

## 2016-01-22 DIAGNOSIS — M0609 Rheumatoid arthritis without rheumatoid factor, multiple sites: Secondary | ICD-10-CM | POA: Diagnosis not present

## 2016-01-22 DIAGNOSIS — R5382 Chronic fatigue, unspecified: Secondary | ICD-10-CM | POA: Diagnosis not present

## 2016-01-22 DIAGNOSIS — M150 Primary generalized (osteo)arthritis: Secondary | ICD-10-CM | POA: Diagnosis not present

## 2016-01-22 DIAGNOSIS — Z79899 Other long term (current) drug therapy: Secondary | ICD-10-CM | POA: Diagnosis not present

## 2016-01-22 DIAGNOSIS — M2550 Pain in unspecified joint: Secondary | ICD-10-CM | POA: Diagnosis not present

## 2016-03-04 ENCOUNTER — Encounter: Payer: Self-pay | Admitting: Adult Health

## 2016-03-04 ENCOUNTER — Ambulatory Visit (INDEPENDENT_AMBULATORY_CARE_PROVIDER_SITE_OTHER): Payer: Commercial Managed Care - HMO | Admitting: Adult Health

## 2016-03-04 VITALS — BP 140/58 | Temp 98.9°F | Wt 179.6 lb

## 2016-03-04 DIAGNOSIS — J302 Other seasonal allergic rhinitis: Secondary | ICD-10-CM

## 2016-03-04 NOTE — Progress Notes (Signed)
   Subjective:    Patient ID: TANEHA VALENTINI, female    DOB: 1930-12-13, 82 y.o.   MRN: [id number]  HPI  82 year old female who presents to the office today for 24 hours of nasal congestion, clear rhinorrhea, and fatigue.   She denies cough, fevers, chills, nausea, vomiting, diarrhea.   She took some OTC generic allergy medication which helps slightly.   Review of Systems  Constitutional: Positive for activity change and fatigue. Negative for fever, chills and diaphoresis.  HENT: Positive for congestion and rhinorrhea. Negative for postnasal drip, sinus pressure and sore throat.   Respiratory: Negative.   Neurological: Negative.   All other systems reviewed and are negative.      Objective:   Physical Exam  Constitutional: She is oriented to person, place, and time. She appears well-developed and well-nourished. No distress.  HENT:  Head: Normocephalic and atraumatic.  Right Ear: Hearing, tympanic membrane, external ear and ear canal normal.  Left Ear: Hearing, tympanic membrane and external ear normal.  Nose: Rhinorrhea present. No mucosal edema. Right sinus exhibits no maxillary sinus tenderness and no frontal sinus tenderness. Left sinus exhibits no maxillary sinus tenderness and no frontal sinus tenderness.  Mouth/Throat: Uvula is midline. No oropharyngeal exudate, posterior oropharyngeal edema or posterior oropharyngeal erythema.  + PND  Eyes: Conjunctivae and EOM are normal. Pupils are equal, round, and reactive to light. Right eye exhibits no discharge. Left eye exhibits no discharge.  Cardiovascular: Normal rate, regular rhythm, normal heart sounds and intact distal pulses.  Exam reveals no gallop and no friction rub.   No murmur heard. Pulmonary/Chest: Effort normal and breath sounds normal. No respiratory distress. She has no wheezes. She has no rales. She exhibits no tenderness.  Neurological: She is alert and oriented to person, place, and time.  Skin: Skin is warm  and dry. No rash noted. She is not diaphoretic. No erythema. No pallor.  Psychiatric: She has a normal mood and affect. Her behavior is normal. Judgment and thought content normal.  Nursing note and vitals reviewed.     Assessment & Plan:  1. Seasonal allergies - Advised to restart Allegra and Flonase - Does not appear to be bacterial in nature.  - Follow up if no improvement in 2-3 days  Dorothyann Peng, NP

## 2016-03-04 NOTE — Patient Instructions (Addendum)
It was great seeing you again today!  Your exam is consistent with seasonal allergies.   Please restart the Allegra and Flonase. Use as directed every day for the next 2-3 weeks. This will help you get through the allergy season.   Follow up with myself or Dr. Ronnald Ramp if your symptoms do not improve in the next 2-3 days   Allergic Rhinitis Allergic rhinitis is when the mucous membranes in the nose respond to allergens. Allergens are particles in the air that cause your body to have an allergic reaction. This causes you to release allergic antibodies. Through a chain of events, these eventually cause you to release histamine into the blood stream. Although meant to protect the body, it is this release of histamine that causes your discomfort, such as frequent sneezing, congestion, and an itchy, runny nose.  CAUSES Seasonal allergic rhinitis (hay fever) is caused by pollen allergens that may come from grasses, trees, and weeds. Year-round allergic rhinitis (perennial allergic rhinitis) is caused by allergens such as house dust mites, pet dander, and mold spores. SYMPTOMS  Nasal stuffiness (congestion).  Itchy, runny nose with sneezing and tearing of the eyes. DIAGNOSIS Your health care provider can help you determine the allergen or allergens that trigger your symptoms. If you and your health care provider are unable to determine the allergen, skin or blood testing may be used. Your health care provider will diagnose your condition after taking your health history and performing a physical exam. Your health care provider may assess you for other related conditions, such as asthma, pink eye, or an ear infection. TREATMENT Allergic rhinitis does not have a cure, but it can be controlled by:  Medicines that block allergy symptoms. These may include allergy shots, nasal sprays, and oral antihistamines.  Avoiding the allergen. Hay fever may often be treated with antihistamines in pill or nasal spray  forms. Antihistamines block the effects of histamine. There are over-the-counter medicines that may help with nasal congestion and swelling around the eyes. Check with your health care provider before taking or giving this medicine. If avoiding the allergen or the medicine prescribed do not work, there are many new medicines your health care provider can prescribe. Stronger medicine may be used if initial measures are ineffective. Desensitizing injections can be used if medicine and avoidance does not work. Desensitization is when a patient is given ongoing shots until the body becomes less sensitive to the allergen. Make sure you follow up with your health care provider if problems continue. HOME CARE INSTRUCTIONS It is not possible to completely avoid allergens, but you can reduce your symptoms by taking steps to limit your exposure to them. It helps to know exactly what you are allergic to so that you can avoid your specific triggers. SEEK MEDICAL CARE IF:  You have a fever.  You develop a cough that does not stop easily (persistent).  You have shortness of breath.  You start wheezing.  Symptoms interfere with normal daily activities.   This information is not intended to replace advice given to you by your health care provider. Make sure you discuss any questions you have with your health care provider.   Document Released: 06/23/2001 Document Revised: 10/19/2014 Document Reviewed: 06/05/2013 Elsevier Interactive Patient Education [employer].

## 2016-03-10 ENCOUNTER — Encounter: Payer: Self-pay | Admitting: Internal Medicine

## 2016-03-10 ENCOUNTER — Ambulatory Visit (INDEPENDENT_AMBULATORY_CARE_PROVIDER_SITE_OTHER): Payer: Commercial Managed Care - HMO | Admitting: Internal Medicine

## 2016-03-10 ENCOUNTER — Ambulatory Visit (INDEPENDENT_AMBULATORY_CARE_PROVIDER_SITE_OTHER)
Admission: RE | Admit: 2016-03-10 | Discharge: 2016-03-10 | Disposition: A | Discharge status: Home / Self Care | Payer: Commercial Managed Care - HMO | Source: Ambulatory Visit | Attending: Internal Medicine | Admitting: Internal Medicine

## 2016-03-10 ENCOUNTER — Telehealth: Payer: Self-pay | Admitting: Internal Medicine

## 2016-03-10 VITALS — BP 120/72 | HR 97 | Temp 98.9°F | Resp 20 | Wt 177.0 lb

## 2016-03-10 DIAGNOSIS — I10 Essential (primary) hypertension: Secondary | ICD-10-CM

## 2016-03-10 DIAGNOSIS — R05 Cough: Secondary | ICD-10-CM

## 2016-03-10 DIAGNOSIS — R059 Cough, unspecified: Secondary | ICD-10-CM | POA: Insufficient documentation

## 2016-03-10 DIAGNOSIS — R062 Wheezing: Secondary | ICD-10-CM

## 2016-03-10 MED ORDER — ALBUTEROL SULFATE HFA 108 (90 BASE) MCG/ACT IN AERS: 2.0000 | INHALATION_SPRAY | Freq: Four times a day (QID) | RESPIRATORY_TRACT | Status: DC | PRN

## 2016-03-10 MED ORDER — PREDNISONE 10 MG PO TABS: ORAL_TABLET | ORAL | Status: DC

## 2016-03-10 MED ORDER — HYDROCODONE-HOMATROPINE 5-1.5 MG/5ML PO SYRP: 5.0000 mL | ORAL_SOLUTION | Freq: Four times a day (QID) | ORAL | Status: DC | PRN

## 2016-03-10 MED ORDER — AZITHROMYCIN 250 MG PO TABS: ORAL_TABLET | ORAL | Status: DC

## 2016-03-10 NOTE — Progress Notes (Signed)
Pre visit review using our clinic review tool, if applicable. No additional management support is needed unless otherwise documented below in the visit note. 

## 2016-03-10 NOTE — Patient Instructions (Signed)
You had the steroid shot today  Please take all new medication as prescribed - the antibiotic, prednisone, cough medicine and inhaler as needed  Please continue all other medications as before, and refills have been done if requested.  Please have the pharmacy call with any other refills you may need.  Please keep your appointments with your specialists as you may have planned  Please go to the XRAY Department in the Basement (go straight as you get off the elevator) for the x-ray testing  You will be contacted by phone if any changes need to be made immediately.  Otherwise, you will receive a letter about your results with an explanation, but please check with MyChart first.  Please remember to sign up for MyChart if you have not done so, as this will be important to you in the future with finding out test results, communicating by private email, and scheduling acute appointments online when needed.

## 2016-03-10 NOTE — Progress Notes (Signed)
Subjective:    Patient ID: Briana [name], female    DOB: Feb 01, 1931, 82 y.o.   MRN: [id number]  HPI  Here with acute onset mild to mod 2-3 days ST, HA, general weakness and malaise, with prod cough greenish sputum, but Pt denies chest pain, increased sob or doe, wheezing, orthopnea, PND, increased LE swelling, palpitations, dizziness or syncope, except for onset mild wheezing/sob last pm.  Pt denies new neurological symptoms such as new headache, or facial or extremity weakness or numbness   Pt denies polydipsia, polyuria.  Pt denies wt loss, night sweats, loss of appetite, or other constitutional symptoms Past Medical History  Diagnosis Date  . ACUTE URIS OF UNSPECIFIED SITE 11/26/2008  . ALLERGIC RHINITIS 10/28/2007  . ARTHRITIS, KNEES, BILATERAL 10/28/2007  . BACK PAIN 04/12/2009  . BUNIONECTOMY, HX OF 10/28/2007  . CELLULITIS&ABSCESS OF HAND EXCEPT FINGERS&THUMB 10/23/2010  . HYPERLIPIDEMIA 10/28/2007  . HYPERTENSION 10/28/2007  . INSOMNIA UNSPECIFIED 10/31/2007  . MYOSITIS 10/22/2010  . NECK PAIN 12/03/2008  . Pain in joint, site unspecified 10/07/2010  . WRIST PAIN, LEFT 10/22/2010  . ALLERGIC ARTHRITIS OTHER SPECIFIED SITES 10/22/2010    OA + RA  . Complication of anesthesia     itching and makes me crazy  . Family history of anesthesia complication     DAUGHTER HAD HIVES AND " WENT WILD "   Past Surgical History  Procedure Laterality Date  . Arthroscopic surgery and rotator cuff repair left shoulder    . Arthroscopy right knee hx of surgery    . Hand surgery for broken finger, remote    . Bunionectomy      hx of  . Foot surgery      RIGHT  . Total knee arthroplasty  07/01/2012    Procedure: TOTAL KNEE ARTHROPLASTY;  Surgeon: Yvette Rack., MD;  Location: [place];  Service: Orthopedics;  Laterality: Right;  right total knee arthroplasty  . Joint replacement Right     knee  . Total knee arthroplasty Left 06/13/2014    dr Percell Miller  . Total knee arthroplasty Left 06/13/2014   Procedure: TOTAL KNEE ARTHROPLASTY;  Surgeon: Ninetta Lights, MD;  Location: [place];  Service: Orthopedics;  Laterality: Left;    reports that she has never smoked. She has never used smokeless tobacco. She reports that she does not drink alcohol or use illicit drugs. family history includes Cancer in her brother; Coronary artery disease in her other; Heart attack in her other. Allergies  Allergen Reactions  . Ibuprofen Anaphylaxis  . Percocet [Oxycodone-Acetaminophen]     nausea  . Diltiazem Hcl     REACTION: ANGIO EDEMA  . Hydrochlorothiazide     REACTION: SWELLING  . Lisinopril     REACTION: SWELLING  . Verapamil     REACTION: SWELLING   Current Outpatient Prescriptions on File Prior to Visit  Medication Sig Dispense Refill  . amLODipine (NORVASC) 10 MG tablet TAKE 1 TABLET EVERY DAY 90 tablet 2  . atenolol (TENORMIN) 100 MG tablet TAKE 1 TABLET EVERY DAY 90 tablet 2  . Cholecalciferol (VITAMIN D) 2000 UNITS tablet Take 2,000 Units by mouth daily.    . fexofenadine (ALLEGRA) 180 MG tablet Take 180 mg by mouth daily.    . folic acid (FOLVITE) 1 MG tablet Take 1 mg by mouth daily.    . methotrexate (RHEUMATREX) 2.5 MG tablet Take 20 mg by mouth once a week. Caution:Chemotherapy. Protect from light.  Taken on Fridays.    Marland Kitchen  simvastatin (ZOCOR) 40 MG tablet TAKE 1 TABLET EVERY EVENING 90 tablet 2  . traMADol (ULTRAM) 50 MG tablet Take 1 tablet (50 mg total) by mouth every 12 (twelve) hours as needed. 180 tablet 1   No current facility-administered medications on file prior to visit.   Review of Systems  Constitutional: Negative for unusual diaphoresis or night sweats HENT: Negative for ear swelling or discharge Eyes: Negative for worsening visual haziness  Respiratory: Negative for choking and stridor.   Gastrointestinal: Negative for distension or worsening eructation Genitourinary: Negative for retention or change in urine volume.  Musculoskeletal: Negative for other MSK pain or  swelling Skin: Negative for color change and worsening wound Neurological: Negative for tremors and numbness other than noted  Psychiatric/Behavioral: Negative for decreased concentration or agitation other than above       Objective:   Physical Exam BP 120/72 mmHg  Pulse 97  Temp(Src) 98.9 F (37.2 C) (Oral)  Resp 20  Wt 177 lb (80.287 kg)  SpO2 97% VS noted, mild wheezing Constitutional: Pt appears in no apparent distress HENT: Head: NCAT.  Right Ear: External ear normal.  Left Ear: External ear normal.  Eyes: . Pupils are equal, round, and reactive to light. Conjunctivae and EOM are normal Neck: Normal range of motion. Neck supple.  Cardiovascular: Normal rate and regular rhythm.   Pulmonary/Chest: Effort normal and breath sounds decreased without rales but with few scattered bilat wheezing.  Neurological: Pt is alert. Not confused , motor grossly intact Skin: Skin is warm. No rash, no LE edema Psychiatric: Pt behavior is normal. No agitation.     Assessment & Plan:

## 2016-03-10 NOTE — Telephone Encounter (Signed)
Patient Name: ZAYLAH SICOLI DOB: 1930/12/16 Initial Comment having short of breath Nurse Assessment Nurse: Ronnald Ramp, RN, Miranda Date/Time (Eastern Time): 03/10/2016 1:18:31 PM Confirm and document reason for call. If symptomatic, describe symptoms. You must click the next button to save text entered. ---Caller states she has congestion and cough since the weekend. Has the patient traveled out of the country within the last 30 days? ---Not Applicable Does the patient have any new or worsening symptoms? ---Yes Will a triage be completed? ---Yes Related visit to physician within the last 2 weeks? ---Yes Does the PT have any chronic conditions? (i.e. diabetes, asthma, etc.) ---Yes List chronic conditions. ---HTN, Arthritis, Allergies Is this a behavioral health or substance abuse call? ---No Guidelines Guideline Title Affirmed Question Affirmed Notes Cough - Acute Productive Wheezing is present Final Disposition User See Physician within 4 Hours (or PCP triage) Ronnald Ramp, RN, Miranda Comments No appt avialable with PCP. Appt scheduled for 4:15 today with Dr. Jenny Reichmann. Referrals REFERRED TO PCP OFFICE Disagree/Comply: Comply

## 2016-03-11 ENCOUNTER — Encounter: Payer: Self-pay | Admitting: Internal Medicine

## 2016-03-11 NOTE — Assessment & Plan Note (Addendum)
Mild, for depomedrol IM, predpac asd, to f/u any worsening symptoms or concerns

## 2016-03-11 NOTE — Assessment & Plan Note (Addendum)
Mild to mod, c/w bronchitis vs pna, for cxr, for antibx course, cough med prn,  to f/u any worsening symptoms or concerns 

## 2016-03-11 NOTE — Assessment & Plan Note (Signed)
stable overall by history and exam, recent data reviewed with pt, and pt to continue medical treatment as before,  to f/u any worsening symptoms or concerns BP Readings from Last 3 Encounters:  03/10/16 120/72  03/04/16 140/58  01/16/16 150/62

## 2016-04-22 DIAGNOSIS — M5432 Sciatica, left side: Secondary | ICD-10-CM | POA: Diagnosis not present

## 2016-04-22 DIAGNOSIS — M0609 Rheumatoid arthritis without rheumatoid factor, multiple sites: Secondary | ICD-10-CM | POA: Diagnosis not present

## 2016-04-22 DIAGNOSIS — Z79899 Other long term (current) drug therapy: Secondary | ICD-10-CM | POA: Diagnosis not present

## 2016-04-22 DIAGNOSIS — M150 Primary generalized (osteo)arthritis: Secondary | ICD-10-CM | POA: Diagnosis not present

## 2016-04-22 DIAGNOSIS — M2550 Pain in unspecified joint: Secondary | ICD-10-CM | POA: Diagnosis not present

## 2016-05-21 ENCOUNTER — Other Ambulatory Visit: Payer: Self-pay | Admitting: *Deleted

## 2016-05-21 MED ORDER — ATENOLOL 100 MG PO TABS: 100.0000 mg | ORAL_TABLET | Freq: Every day | ORAL | 1 refills | Status: DC

## 2016-05-21 NOTE — Telephone Encounter (Signed)
Rec'd call pt states [employer] called her and told her that they do not have the Atenolol med is on back order. Pt states she called [facility] and they stated they had some she is needing a [facility]. Inform pt will send electronically...Johny Chess

## 2016-05-31 IMAGING — DX DG CERVICAL SPINE COMPLETE 4+V
5 series · 5 of 5 positions shown · non-contrast
Comparison: 09/03/2014.

CLINICAL DATA: Neck pain.

EXAM:
CERVICAL SPINE - COMPLETE 4+ VIEW

[c-spine lat]
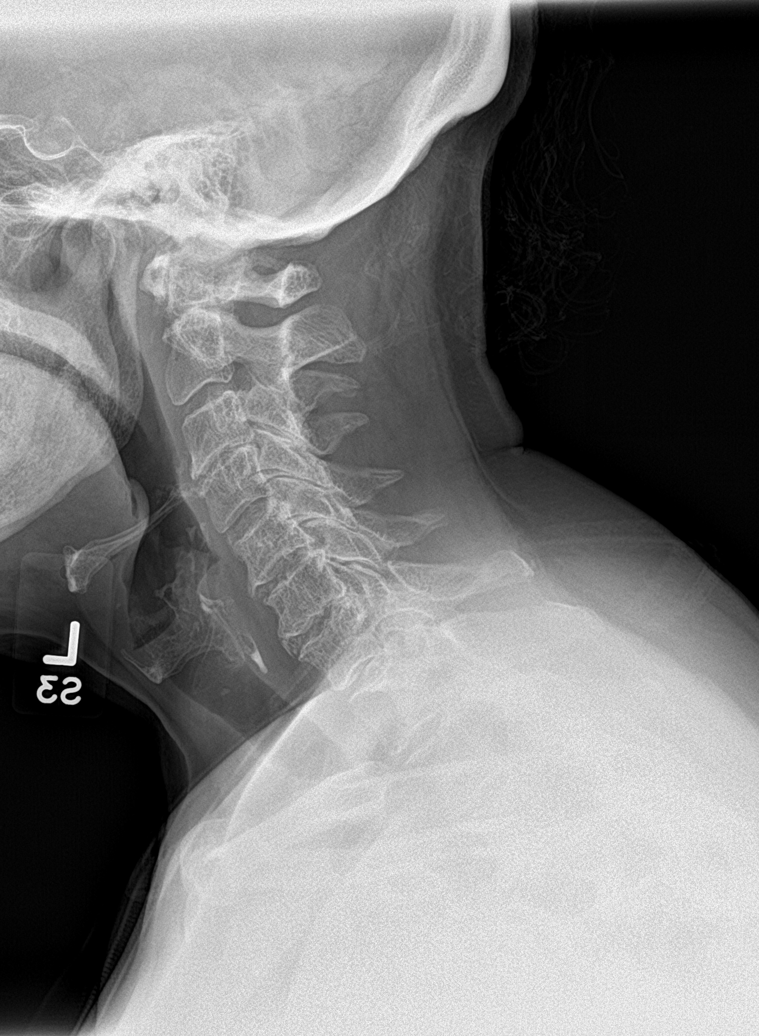

[c-spine obl (1 of 2)]
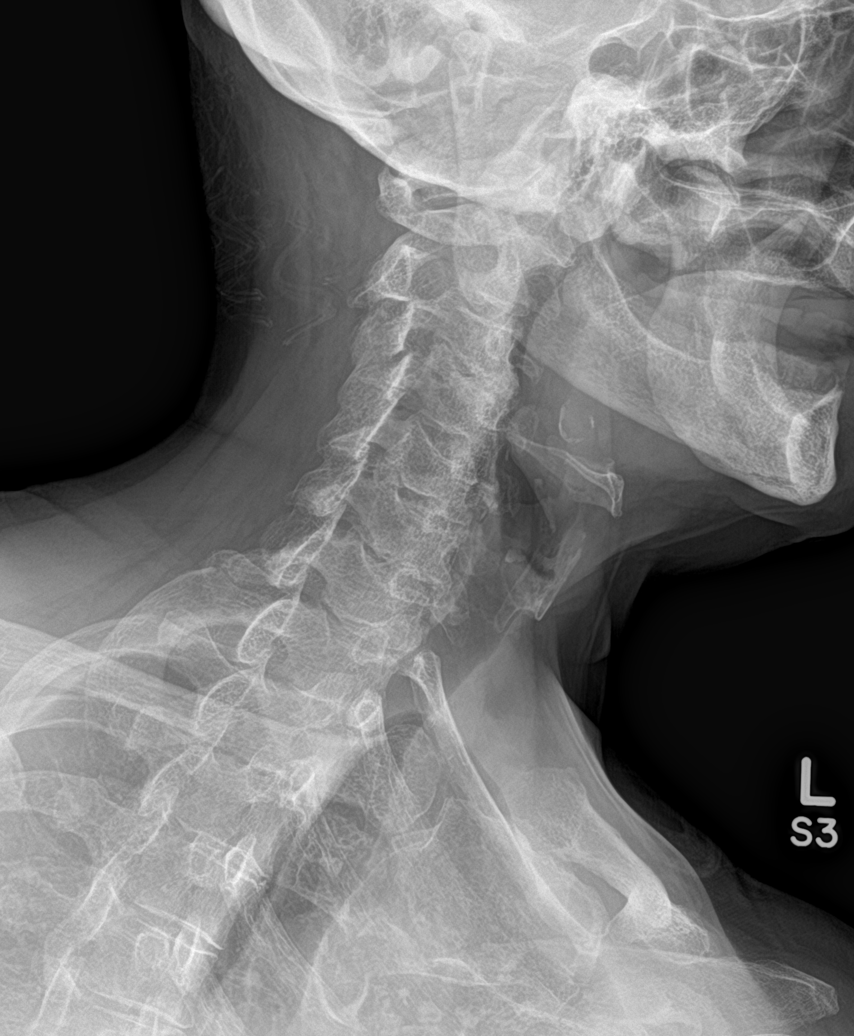

[c-spine obl (2 of 2)]
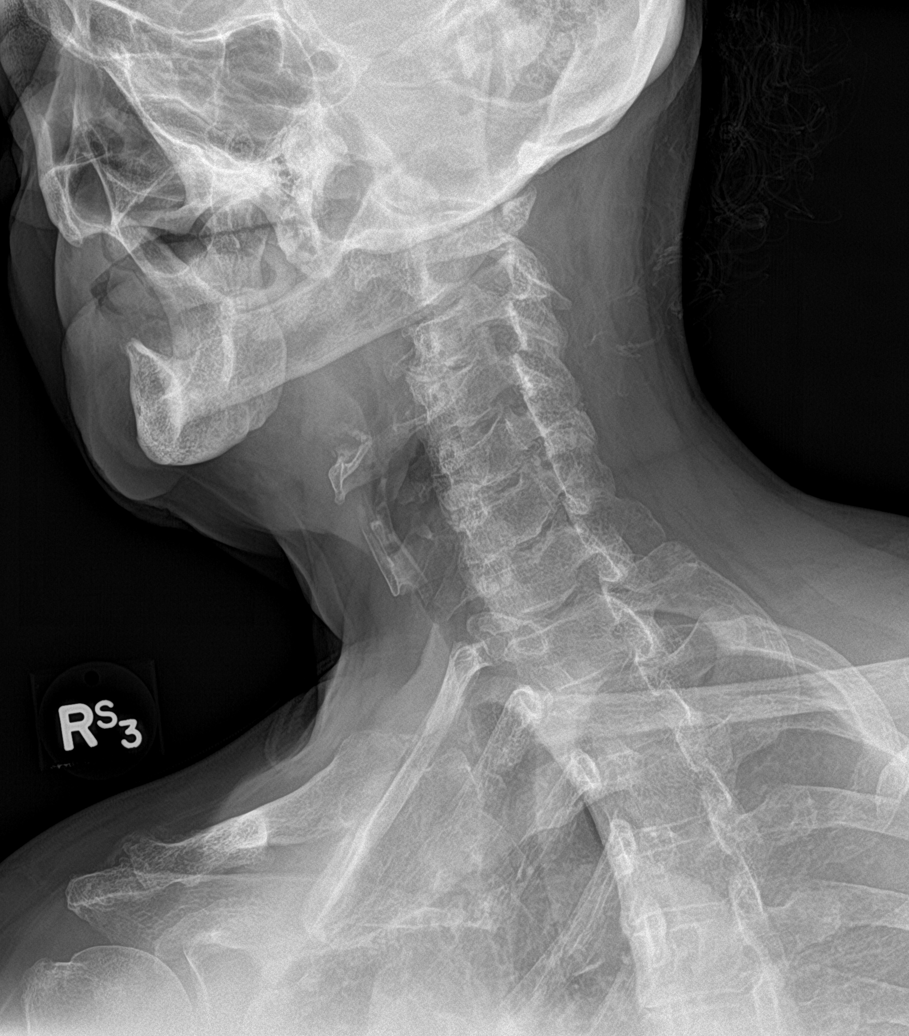

[c-spine ap]
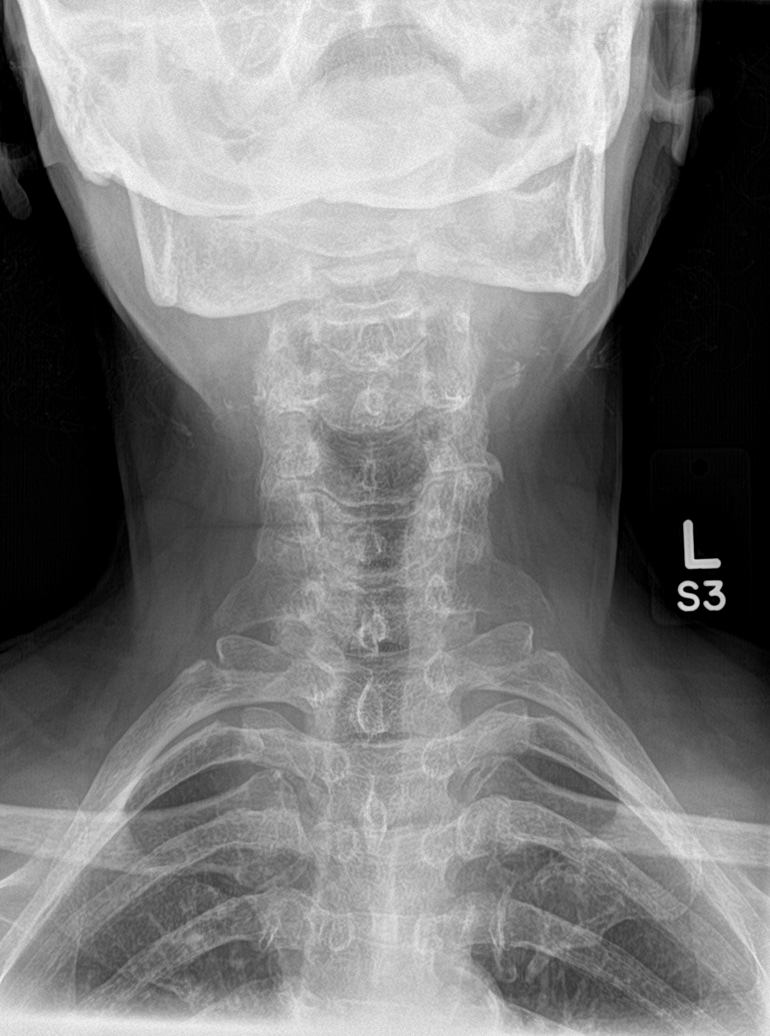

[c-spine open mouth]
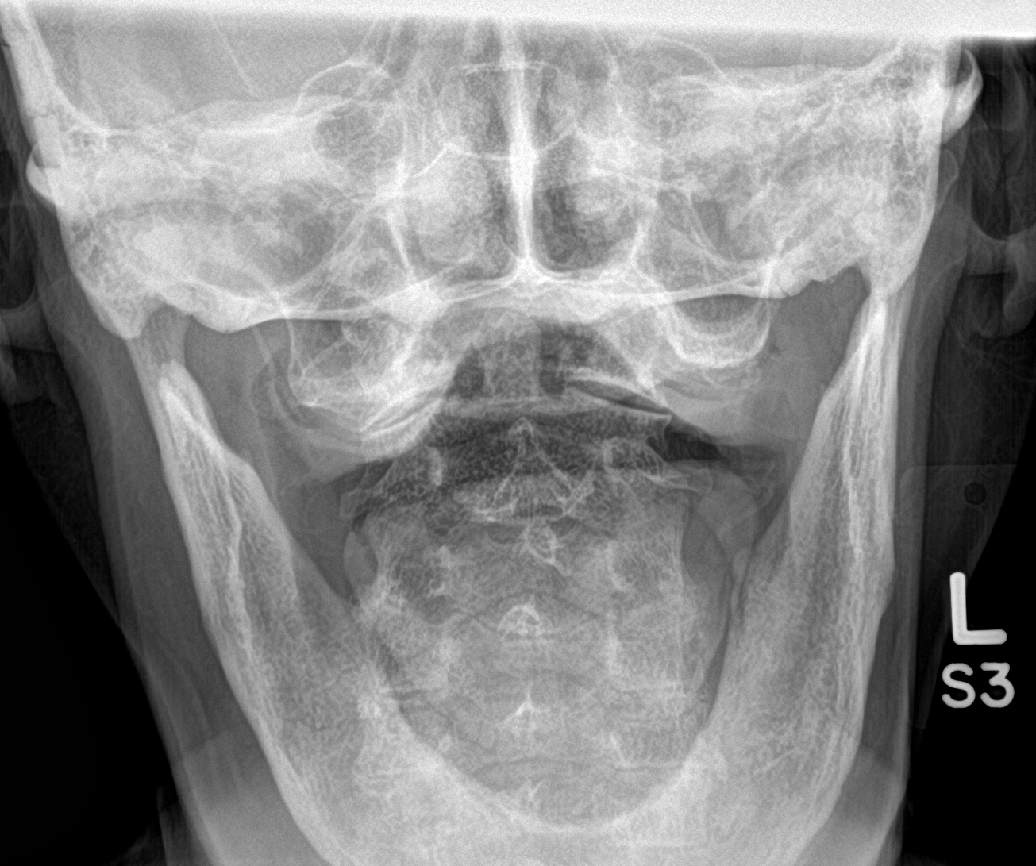

[5 of 5 positions shown; findings below may reference images not displayed]

FINDINGS: Multilevel prominent degenerative change. Findings have progressed
slightly from prior exam. Diffuse osteopenia. No acute bony
abnormality identified. Carotid vascular calcification cannot be
excluded .
IMPRESSION: 1. Diffuse osteopenia and multilevel prominent degenerative change.
Findings have progressed slightly from prior study of 09/03/2014 .
No acute bony abnormality.

2. Carotid atherosclerotic vascular disease cannot be excluded .

## 2016-07-07 ENCOUNTER — Other Ambulatory Visit: Payer: Self-pay | Admitting: Internal Medicine

## 2016-07-23 DIAGNOSIS — M150 Primary generalized (osteo)arthritis: Secondary | ICD-10-CM | POA: Diagnosis not present

## 2016-07-23 DIAGNOSIS — M0609 Rheumatoid arthritis without rheumatoid factor, multiple sites: Secondary | ICD-10-CM | POA: Diagnosis not present

## 2016-07-23 DIAGNOSIS — Z79899 Other long term (current) drug therapy: Secondary | ICD-10-CM | POA: Diagnosis not present

## 2016-07-23 DIAGNOSIS — M5432 Sciatica, left side: Secondary | ICD-10-CM | POA: Diagnosis not present

## 2016-07-23 DIAGNOSIS — M67911 Unspecified disorder of synovium and tendon, right shoulder: Secondary | ICD-10-CM | POA: Diagnosis not present

## 2016-07-23 DIAGNOSIS — M2550 Pain in unspecified joint: Secondary | ICD-10-CM | POA: Diagnosis not present

## 2016-08-31 DIAGNOSIS — Z1231 Encounter for screening mammogram for malignant neoplasm of breast: Secondary | ICD-10-CM | POA: Diagnosis not present

## 2016-08-31 LAB — HM MAMMOGRAPHY

## 2016-09-02 ENCOUNTER — Encounter: Payer: Self-pay | Admitting: Internal Medicine

## 2016-09-08 DIAGNOSIS — H04123 Dry eye syndrome of bilateral lacrimal glands: Secondary | ICD-10-CM | POA: Diagnosis not present

## 2016-09-08 DIAGNOSIS — Z961 Presence of intraocular lens: Secondary | ICD-10-CM | POA: Diagnosis not present

## 2016-09-08 DIAGNOSIS — H10413 Chronic giant papillary conjunctivitis, bilateral: Secondary | ICD-10-CM | POA: Diagnosis not present

## 2016-10-14 ENCOUNTER — Other Ambulatory Visit: Payer: Self-pay | Admitting: Internal Medicine

## 2016-10-23 DIAGNOSIS — Z79899 Other long term (current) drug therapy: Secondary | ICD-10-CM | POA: Diagnosis not present

## 2016-10-23 DIAGNOSIS — E669 Obesity, unspecified: Secondary | ICD-10-CM | POA: Diagnosis not present

## 2016-10-23 DIAGNOSIS — M150 Primary generalized (osteo)arthritis: Secondary | ICD-10-CM | POA: Diagnosis not present

## 2016-10-23 DIAGNOSIS — M0609 Rheumatoid arthritis without rheumatoid factor, multiple sites: Secondary | ICD-10-CM | POA: Diagnosis not present

## 2016-10-23 DIAGNOSIS — R110 Nausea: Secondary | ICD-10-CM | POA: Diagnosis not present

## 2016-10-23 DIAGNOSIS — M67911 Unspecified disorder of synovium and tendon, right shoulder: Secondary | ICD-10-CM | POA: Diagnosis not present

## 2016-10-23 DIAGNOSIS — M5432 Sciatica, left side: Secondary | ICD-10-CM | POA: Diagnosis not present

## 2016-10-23 DIAGNOSIS — Z6830 Body mass index (BMI) 30.0-30.9, adult: Secondary | ICD-10-CM | POA: Diagnosis not present

## 2016-10-23 DIAGNOSIS — M2550 Pain in unspecified joint: Secondary | ICD-10-CM | POA: Diagnosis not present

## 2016-10-23 LAB — CBC AND DIFFERENTIAL
HEMATOCRIT: 38 % (ref 36–46)
HEMOGLOBIN: 12.3 g/dL (ref 12.0–16.0)
NEUTROS ABS: 6 /uL
PLATELETS: 212 10*3/uL (ref 150–399)
WBC: 7.7 10^3/mL

## 2016-10-23 LAB — BASIC METABOLIC PANEL
BUN: 17 mg/dL (ref 4–21)
Creatinine: 0.8 mg/dL (ref 0.5–1.1)
Glucose: 125 mg/dL
Potassium: 3.9 mmol/L (ref 3.4–5.3)
Sodium: 144 mmol/L (ref 137–147)

## 2016-10-23 LAB — HEPATIC FUNCTION PANEL
ALT: 22 U/L (ref 7–35)
AST: 24 U/L (ref 13–35)
Alkaline Phosphatase: 100 U/L (ref 25–125)
BILIRUBIN, TOTAL: 0.5 mg/dL

## 2016-11-02 ENCOUNTER — Other Ambulatory Visit (INDEPENDENT_AMBULATORY_CARE_PROVIDER_SITE_OTHER): Payer: Medicare HMO

## 2016-11-02 ENCOUNTER — Encounter: Payer: Self-pay | Admitting: Internal Medicine

## 2016-11-02 ENCOUNTER — Ambulatory Visit (INDEPENDENT_AMBULATORY_CARE_PROVIDER_SITE_OTHER): Payer: Medicare HMO | Admitting: Internal Medicine

## 2016-11-02 VITALS — BP 120/60 | HR 57 | Temp 98.1°F | Ht 64.0 in | Wt 182.0 lb

## 2016-11-02 DIAGNOSIS — I10 Essential (primary) hypertension: Secondary | ICD-10-CM

## 2016-11-02 DIAGNOSIS — Z0000 Encounter for general adult medical examination without abnormal findings: Secondary | ICD-10-CM | POA: Diagnosis not present

## 2016-11-02 DIAGNOSIS — R7309 Other abnormal glucose: Secondary | ICD-10-CM

## 2016-11-02 DIAGNOSIS — E785 Hyperlipidemia, unspecified: Secondary | ICD-10-CM

## 2016-11-02 LAB — HEMOGLOBIN A1C: HEMOGLOBIN A1C: 6.2 % (ref 4.6–6.5)

## 2016-11-02 LAB — THYROID PANEL WITH TSH
Free Thyroxine Index: 2.4 (ref 1.4–3.8)
T3 UPTAKE: 30 % (ref 22–35)
T4, Total: 7.9 ug/dL (ref 4.5–12.0)
TSH: 1.67 mIU/L

## 2016-11-02 LAB — CK: CK TOTAL: 130 U/L (ref 7–177)

## 2016-11-02 MED ORDER — AMLODIPINE BESYLATE 10 MG PO TABS: 10.0000 mg | ORAL_TABLET | Freq: Every day | ORAL | 1 refills | Status: DC

## 2016-11-02 MED ORDER — SIMVASTATIN 40 MG PO TABS: 40.0000 mg | ORAL_TABLET | Freq: Every evening | ORAL | 1 refills | Status: DC

## 2016-11-02 NOTE — Patient Instructions (Signed)

## 2016-11-02 NOTE — Progress Notes (Signed)
Subjective:  Patient ID: Briana [name], female    DOB: 1931-08-11  Age: 83 y.o. MRN: [id number]  CC: Annual Exam; Hypertension; and Hyperlipidemia   HPI Briana [name] presents for an AWV/CPX.  Her blood pressure is been well controlled on amlodipine and she has had no recent episodes of headache/blurred vision/chest pain/or shortness of breath.  She is taking the statin and tolerating it without any muscle, she does complain of joint pains related to rheumatoid arthritis.   Past Medical History:  Diagnosis Date  . ACUTE URIS OF UNSPECIFIED SITE 11/26/2008  . ALLERGIC ARTHRITIS OTHER SPECIFIED SITES 10/22/2010   OA + RA  . ALLERGIC RHINITIS 10/28/2007  . ARTHRITIS, KNEES, BILATERAL 10/28/2007  . BACK PAIN 04/12/2009  . BUNIONECTOMY, HX OF 10/28/2007  . CELLULITIS&ABSCESS OF HAND EXCEPT FINGERS&THUMB 10/23/2010  . Complication of anesthesia    itching and makes me crazy  . Family history of anesthesia complication    DAUGHTER HAD HIVES AND " WENT WILD "  . HYPERLIPIDEMIA 10/28/2007  . HYPERTENSION 10/28/2007  . INSOMNIA UNSPECIFIED 10/31/2007  . MYOSITIS 10/22/2010  . NECK PAIN 12/03/2008  . Pain in joint, site unspecified 10/07/2010  . WRIST PAIN, LEFT 10/22/2010   Past Surgical History:  Procedure Laterality Date  . arthroscopic surgery and rotator cuff repair left shoulder    . arthroscopy right knee hx of surgery    . BUNIONECTOMY     hx of  . FOOT SURGERY     RIGHT  . hand surgery for broken finger, remote    . JOINT REPLACEMENT Right    knee  . TOTAL KNEE ARTHROPLASTY  07/01/2012   Procedure: TOTAL KNEE ARTHROPLASTY;  Surgeon: Yvette Rack., MD;  Location: [place];  Service: Orthopedics;  Laterality: Right;  right total knee arthroplasty  . TOTAL KNEE ARTHROPLASTY Left 06/13/2014   dr Percell Miller  . TOTAL KNEE ARTHROPLASTY Left 06/13/2014   Procedure: TOTAL KNEE ARTHROPLASTY;  Surgeon: Ninetta Lights, MD;  Location: [place];  Service: Orthopedics;  Laterality: Left;    reports that she has never smoked. She has never used smokeless tobacco. She reports that she does not drink alcohol or use drugs. family history includes Cancer in her brother; Coronary artery disease in her other; Heart attack in her other. Allergies  Allergen Reactions  . Ibuprofen Anaphylaxis  . Percocet [Oxycodone-Acetaminophen]     nausea  . Diltiazem Hcl     REACTION: ANGIO EDEMA  . Hydrochlorothiazide     REACTION: SWELLING  . Lisinopril     REACTION: SWELLING  . Verapamil     REACTION: SWELLING    Outpatient Medications Prior to Visit  Medication Sig Dispense Refill  . Cholecalciferol (VITAMIN D) 2000 UNITS tablet Take 2,000 Units by mouth daily.    . fexofenadine (ALLEGRA) 180 MG tablet Take 180 mg by mouth daily.    . folic acid (FOLVITE) 1 MG tablet Take 1 mg by mouth daily.    . methotrexate (RHEUMATREX) 2.5 MG tablet Take 20 mg by mouth once a week. Caution:Chemotherapy. Protect from light.  Taken on Fridays.    . traMADol (ULTRAM) 50 MG tablet Take 1 tablet (50 mg total) by mouth every 12 (twelve) hours as needed. 180 tablet 1  . amLODipine (NORVASC) 10 MG tablet TAKE 1 TABLET EVERY DAY 90 tablet 0  . atenolol (TENORMIN) 100 MG tablet Take 1 tablet (100 mg total) by mouth daily. 90 tablet 1  . simvastatin (ZOCOR) 40 MG  tablet TAKE 1 TABLET EVERY EVENING 90 tablet 2  . albuterol (PROVENTIL HFA;VENTOLIN HFA) 108 (90 Base) MCG/ACT inhaler Inhale 2 puffs into the lungs every 6 (six) hours as needed for wheezing or shortness of breath. 1 Inhaler 5  . azithromycin (ZITHROMAX Z-PAK) 250 MG tablet 2 tab by mouth on day 1, then 1 tab per day 6 tablet 1  . HYDROcodone-homatropine (HYCODAN) 5-1.5 MG/5ML syrup Take 5 mLs by mouth every 6 (six) hours as needed for cough. 180 mL 0  . predniSONE (DELTASONE) 10 MG tablet 3 tabs by mouth per day for 3 days,2tabs per day for 3 days,1tab per day for 3 days 18 tablet 0   No facility-administered medications prior to visit.      ROS Review of Systems  Constitutional: Negative for appetite change, diaphoresis, fatigue and unexpected weight change.  HENT: Negative.   Eyes: Negative for visual disturbance.  Respiratory: Negative for cough, chest tightness, shortness of breath and wheezing.   Cardiovascular: Negative for chest pain, palpitations and leg swelling.  Gastrointestinal: Negative for abdominal pain, constipation, diarrhea, nausea and vomiting.  Endocrine: Negative.  Negative for polydipsia, polyphagia and polyuria.  Genitourinary: Negative.  Negative for decreased urine volume, dysuria, flank pain, hematuria and urgency.  Musculoskeletal: Negative.  Negative for arthralgias, back pain, myalgias and neck pain.  Skin: Negative.  Negative for color change and rash.  Allergic/Immunologic: Negative.   Neurological: Negative.  Negative for dizziness and weakness.  Hematological: Negative.  Negative for adenopathy. Does not bruise/bleed easily.  Psychiatric/Behavioral: Negative.     Objective:  BP 120/60 (BP Location: Left Arm, Patient Position: Sitting, Cuff Size: Large)   Pulse (!) 57   Temp 98.1 F (36.7 C) (Oral)   Ht 5\' 4"  (1.626 m)   Wt 182 lb (82.6 kg)   SpO2 98%   BMI 31.24 kg/m   BP Readings from Last 3 Encounters:  11/02/16 120/60  03/10/16 120/72  03/04/16 (!) 140/58    Wt Readings from Last 3 Encounters:  11/02/16 182 lb (82.6 kg)  03/10/16 177 lb (80.3 kg)  03/04/16 179 lb 9.6 oz (81.5 kg)    Physical Exam  Constitutional: She is oriented to person, place, and time. No distress.  HENT:  Mouth/Throat: Oropharynx is clear and moist. No oropharyngeal exudate.  Eyes: Conjunctivae are normal. Right eye exhibits no discharge. Left eye exhibits no discharge. No scleral icterus.  Neck: Normal range of motion. Neck supple. No JVD present. No tracheal deviation present. No thyromegaly present.  Cardiovascular: Normal rate, regular rhythm, normal heart sounds and intact distal pulses.   Exam reveals no gallop and no friction rub.   No murmur heard. Pulmonary/Chest: Effort normal and breath sounds normal. No stridor. No respiratory distress. She has no wheezes. She has no rales. She exhibits no tenderness.  Abdominal: Soft. Bowel sounds are normal. She exhibits no distension and no mass. There is no tenderness. There is no rebound and no guarding.  Musculoskeletal: Normal range of motion. She exhibits no edema, tenderness or deformity.  Lymphadenopathy:    She has no cervical adenopathy.  Neurological: She is oriented to person, place, and time.  Skin: Skin is warm and dry. No rash noted. She is not diaphoretic. No erythema. No pallor.  Psychiatric: She has a normal mood and affect. Her behavior is normal. Judgment and thought content normal.  Vitals reviewed.   Lab Results  Component Value Date   WBC 7.7 10/23/2016   HGB 12.3 10/23/2016   HCT  38 10/23/2016   PLT 212 10/23/2016   GLUCOSE 130 (H) 10/23/2015   CHOL 218 (H) 10/23/2015   TRIG 101.0 10/23/2015   HDL 111.50 10/23/2015   LDLDIRECT 104.5 04/25/2010   LDLCALC 86 10/23/2015   ALT 22 10/23/2016   AST 24 10/23/2016   NA 144 10/23/2016   K 3.9 10/23/2016   CL 102 10/23/2015   CREATININE 0.8 10/23/2016   BUN 17 10/23/2016   CO2 30 10/23/2015   TSH 1.67 11/02/2016   INR 0.92 06/05/2014   HGBA1C 6.2 11/02/2016    Dg Chest 2 View  Result Date: 03/11/2016 CLINICAL DATA:  Cough and wheezing. EXAM: CHEST  2 VIEW COMPARISON:  06/05/2014. FINDINGS: Mediastinum and hilar structures are normal. Mild cardiomegaly with normal pulmonary vascularity. No focal infiltrate. No pleural effusion or pneumothorax. IMPRESSION: 1. Mild cardiomegaly.  No CHF. 2. No acute pulmonary disease. Electronically Signed   By: Marcello Moores  Register   On: 03/11/2016 09:02    Assessment & Plan:   Betzy was seen today for annual exam, hypertension and hyperlipidemia.  Diagnoses and all orders for this visit:  Essential hypertension,  benign- her blood pressure is well-controlled, will continue amlodipine at the current dose. -     Thyroid Panel With TSH; Future -     amLODipine (NORVASC) 10 MG tablet; Take 1 tablet (10 mg total) by mouth daily.  Other abnormal glucose- her A1c is up to 6.2%, she is prediabetic, no medications are needed at this time, she agrees to work on her lifestyle modifications. -     Hemoglobin A1c; Future  Hyperlipidemia with target LDL less than 130- she has achieved her LDL goal is doing well on the statin with no evidence of myopathy -     Lipid panel; Future -     Thyroid Panel With TSH; Future -     CK; Future -     simvastatin (ZOCOR) 40 MG tablet; Take 1 tablet (40 mg total) by mouth every evening.  Routine health maintenance   I have discontinued Ms. Coghlan's predniSONE, albuterol, HYDROcodone-homatropine, and azithromycin. I have also changed her simvastatin and amLODipine. Additionally, I am having her maintain her folic acid, methotrexate, Vitamin D, fexofenadine, traMADol, and metoCLOPramide.  Meds ordered this encounter  Medications  . metoCLOPramide (REGLAN) 5 MG tablet  . simvastatin (ZOCOR) 40 MG tablet    Sig: Take 1 tablet (40 mg total) by mouth every evening.    Dispense:  90 tablet    Refill:  1  . amLODipine (NORVASC) 10 MG tablet    Sig: Take 1 tablet (10 mg total) by mouth daily.    Dispense:  90 tablet    Refill:  1   See AVS for instructions about healthy living and anticipatory guidance.  Follow-up: Return in about 6 months (around 05/02/2017).  Scarlette Calico, MD

## 2016-11-02 NOTE — Assessment & Plan Note (Signed)

## 2016-11-02 NOTE — Progress Notes (Signed)
Pre visit review using our clinic review tool, if applicable. No additional management support is needed unless otherwise documented below in the visit note. 

## 2016-11-06 ENCOUNTER — Telehealth: Payer: Self-pay | Admitting: *Deleted

## 2016-11-06 MED ORDER — ATENOLOL 100 MG PO TABS: 100.0000 mg | ORAL_TABLET | Freq: Every day | ORAL | 1 refills | Status: DC

## 2016-11-06 NOTE — Telephone Encounter (Signed)
Pt left msg on triage stating she had labs done week ago never received results, and also needing refill on [phone]. Sent refill pls advise on labs...Johny Chess

## 2016-11-08 NOTE — Telephone Encounter (Signed)
Her labs were all okay I don't see any concerns with her labs

## 2016-11-09 NOTE — Telephone Encounter (Signed)
Notified pt w/MD response.../lmb 

## 2016-11-30 ENCOUNTER — Other Ambulatory Visit: Payer: Self-pay | Admitting: Internal Medicine

## 2017-01-21 DIAGNOSIS — Z6830 Body mass index (BMI) 30.0-30.9, adult: Secondary | ICD-10-CM | POA: Diagnosis not present

## 2017-01-21 DIAGNOSIS — B351 Tinea unguium: Secondary | ICD-10-CM | POA: Diagnosis not present

## 2017-01-21 DIAGNOSIS — M0609 Rheumatoid arthritis without rheumatoid factor, multiple sites: Secondary | ICD-10-CM | POA: Diagnosis not present

## 2017-01-21 DIAGNOSIS — Z79899 Other long term (current) drug therapy: Secondary | ICD-10-CM | POA: Diagnosis not present

## 2017-01-21 DIAGNOSIS — M150 Primary generalized (osteo)arthritis: Secondary | ICD-10-CM | POA: Diagnosis not present

## 2017-01-21 DIAGNOSIS — M2550 Pain in unspecified joint: Secondary | ICD-10-CM | POA: Diagnosis not present

## 2017-01-21 DIAGNOSIS — E669 Obesity, unspecified: Secondary | ICD-10-CM | POA: Diagnosis not present

## 2017-01-26 ENCOUNTER — Other Ambulatory Visit: Payer: Self-pay | Admitting: Internal Medicine

## 2017-01-26 ENCOUNTER — Telehealth: Payer: Self-pay | Admitting: Internal Medicine

## 2017-01-26 DIAGNOSIS — I10 Essential (primary) hypertension: Secondary | ICD-10-CM

## 2017-01-26 MED ORDER — ATENOLOL 100 MG PO TABS: 100.0000 mg | ORAL_TABLET | Freq: Every day | ORAL | 1 refills | Status: DC

## 2017-01-26 NOTE — Telephone Encounter (Signed)
all future refills for atenolol (TENORMIN) 100 MG tablet  Need to be sent to [facility] mail order

## 2017-01-28 NOTE — Telephone Encounter (Signed)
This has been sent as requested. No phones to contact patient.

## 2017-02-27 ENCOUNTER — Emergency Department (HOSPITAL_COMMUNITY): Payer: Medicare HMO

## 2017-02-27 ENCOUNTER — Encounter (HOSPITAL_COMMUNITY): Payer: Self-pay | Admitting: Emergency Medicine

## 2017-02-27 ENCOUNTER — Emergency Department (HOSPITAL_COMMUNITY)
Admission: EM | Admit: 2017-02-27 | Discharge: 2017-02-27 | Disposition: A | Discharge status: Home / Self Care | Payer: Medicare HMO | Attending: Emergency Medicine | Admitting: Emergency Medicine

## 2017-02-27 DIAGNOSIS — Z96653 Presence of artificial knee joint, bilateral: Secondary | ICD-10-CM | POA: Diagnosis not present

## 2017-02-27 DIAGNOSIS — I10 Essential (primary) hypertension: Secondary | ICD-10-CM | POA: Insufficient documentation

## 2017-02-27 DIAGNOSIS — R42 Dizziness and giddiness: Secondary | ICD-10-CM | POA: Insufficient documentation

## 2017-02-27 DIAGNOSIS — R9431 Abnormal electrocardiogram [ECG] [EKG]: Secondary | ICD-10-CM | POA: Diagnosis not present

## 2017-02-27 DIAGNOSIS — Z79899 Other long term (current) drug therapy: Secondary | ICD-10-CM | POA: Diagnosis not present

## 2017-02-27 LAB — CBC
HCT: 39.3 % (ref 36.0–46.0)
Hemoglobin: 13.3 g/dL (ref 12.0–15.0)
MCH: 31.5 pg (ref 26.0–34.0)
MCHC: 33.8 g/dL (ref 30.0–36.0)
MCV: 93.1 fL (ref 78.0–100.0)
PLATELETS: 170 10*3/uL (ref 150–400)
RBC: 4.22 MIL/uL (ref 3.87–5.11)
RDW: 15.2 % (ref 11.5–15.5)
WBC: 11 10*3/uL — AB (ref 4.0–10.5)

## 2017-02-27 LAB — URINALYSIS, ROUTINE W REFLEX MICROSCOPIC
BILIRUBIN URINE: NEGATIVE
Bacteria, UA: NONE SEEN
GLUCOSE, UA: NEGATIVE mg/dL
Hgb urine dipstick: NEGATIVE
Ketones, ur: 5 mg/dL — AB
LEUKOCYTES UA: NEGATIVE
Nitrite: NEGATIVE
Protein, ur: 30 mg/dL — AB
SPECIFIC GRAVITY, URINE: 1.013 (ref 1.005–1.030)
pH: 7 (ref 5.0–8.0)

## 2017-02-27 LAB — COMPREHENSIVE METABOLIC PANEL
ALK PHOS: 92 U/L (ref 38–126)
ALT: 18 U/L (ref 14–54)
AST: 45 U/L — AB (ref 15–41)
Albumin: 4.3 g/dL (ref 3.5–5.0)
Anion gap: 8 (ref 5–15)
BUN: 17 mg/dL (ref 6–20)
CALCIUM: 9.5 mg/dL (ref 8.9–10.3)
CO2: 28 mmol/L (ref 22–32)
CREATININE: 0.91 mg/dL (ref 0.44–1.00)
Chloride: 102 mmol/L (ref 101–111)
GFR, EST NON AFRICAN AMERICAN: 57 mL/min — AB (ref >60)
Glucose, Bld: 193 mg/dL — ABNORMAL HIGH (ref 65–99)
Potassium: 6.1 mmol/L — ABNORMAL HIGH (ref 3.5–5.1)
Sodium: 138 mmol/L (ref 135–145)
TOTAL PROTEIN: 8.3 g/dL — AB (ref 6.5–8.1)
Total Bilirubin: 1.5 mg/dL — ABNORMAL HIGH (ref 0.3–1.2)

## 2017-02-27 LAB — LIPASE, BLOOD: Lipase: 28 U/L (ref 11–51)

## 2017-02-27 LAB — I-STAT TROPONIN, ED: Troponin i, poc: 0.02 ng/mL (ref 0.00–0.08)

## 2017-02-27 MED ORDER — FUROSEMIDE 10 MG/ML IJ SOLN
20.0000 mg | Freq: Once | INTRAMUSCULAR | Status: AC
  Administered 2017-02-27: 20 mg via INTRAVENOUS
  Filled 2017-02-27: qty 4

## 2017-02-27 MED ORDER — SODIUM CHLORIDE 0.9 % IV BOLUS (SEPSIS)
1000.0000 mL | Freq: Once | INTRAVENOUS | Status: AC
Start: 2017-02-27 — End: 2017-02-27
  Administered 2017-02-27: 1000 mL via INTRAVENOUS

## 2017-02-27 MED ORDER — MECLIZINE HCL 25 MG PO TABS: 25.0000 mg | ORAL_TABLET | Freq: Three times a day (TID) | ORAL | 0 refills | Status: DC | PRN

## 2017-02-27 NOTE — ED Provider Notes (Signed)
[facility] DEPT Provider Note   CSN: [id number] Arrival date & time: 02/27/17  1126     History   Chief Complaint Chief Complaint  Patient presents with  . Dizziness  . Emesis    HPI Briana [name] is a 83 y.o. female presenting with a sudden episode of transient dizziness where she felt like the floor in the room was spinning around her. She was subsequently nauseated and vomiting. She denies a history of vertigo, no associated loss of hearing. No fever, chills, recent infection. She denies any headache. She reports that she is no longer dizzy has a little bit of sick feeling to her stomach but otherwise feels fine and wants go home.  HPI  Past Medical History:  Diagnosis Date  . ACUTE URIS OF UNSPECIFIED SITE 11/26/2008  . ALLERGIC ARTHRITIS OTHER SPECIFIED SITES 10/22/2010   OA + RA  . ALLERGIC RHINITIS 10/28/2007  . ARTHRITIS, KNEES, BILATERAL 10/28/2007  . BACK PAIN 04/12/2009  . BUNIONECTOMY, HX OF 10/28/2007  . CELLULITIS&ABSCESS OF HAND EXCEPT FINGERS&THUMB 10/23/2010  . Complication of anesthesia    itching and makes me crazy  . Family history of anesthesia complication    DAUGHTER HAD HIVES AND " WENT WILD "  . HYPERLIPIDEMIA 10/28/2007  . HYPERTENSION 10/28/2007  . INSOMNIA UNSPECIFIED 10/31/2007  . MYOSITIS 10/22/2010  . NECK PAIN 12/03/2008  . Pain in joint, site unspecified 10/07/2010  . WRIST PAIN, LEFT 10/22/2010    Patient Active Problem List   Diagnosis Date Noted  . Neck pain, bilateral 09/03/2014  . Essential hypertension, benign 06/19/2014  . RA (rheumatoid arthritis) ([place]) 06/19/2014  . Constipation due to opioid therapy 06/19/2014  . Other abnormal glucose 04/19/2014  . Routine health maintenance 05/30/2012  . INSOMNIA UNSPECIFIED 10/31/2007  . Hyperlipidemia with target LDL less than 130 10/28/2007  . Seasonal allergic rhinitis 10/28/2007  . DJD (degenerative joint disease) of knee 10/28/2007    Past Surgical History:  Procedure Laterality  Date  . arthroscopic surgery and rotator cuff repair left shoulder    . arthroscopy right knee hx of surgery    . BUNIONECTOMY     hx of  . FOOT SURGERY     RIGHT  . hand surgery for broken finger, remote    . JOINT REPLACEMENT Right    knee  . TOTAL KNEE ARTHROPLASTY  07/01/2012   Procedure: TOTAL KNEE ARTHROPLASTY;  Surgeon: Yvette Rack., MD;  Location: [facility];  Service: Orthopedics;  Laterality: Right;  right total knee arthroplasty  . TOTAL KNEE ARTHROPLASTY Left 06/13/2014   dr Percell Miller  . TOTAL KNEE ARTHROPLASTY Left 06/13/2014   Procedure: TOTAL KNEE ARTHROPLASTY;  Surgeon: Ninetta Lights, MD;  Location: [facility];  Service: Orthopedics;  Laterality: Left;    OB History    No data available       Home Medications    Prior to Admission medications   Medication Sig Start Date End Date Taking? Authorizing Provider  amLODipine (NORVASC) 10 MG tablet Take 1 tablet (10 mg total) by mouth daily. 11/02/16  Yes Janith Lima, MD  atenolol (TENORMIN) 100 MG tablet Take 1 tablet (100 mg total) by mouth daily. 01/26/17  Yes Janith Lima, MD  Cholecalciferol (VITAMIN D) 2000 UNITS tablet Take 2,000 Units by mouth daily.   Yes [provider]  fexofenadine (ALLEGRA) 180 MG tablet Take 180 mg by mouth daily.   Yes [provider]  folic acid (FOLVITE) 1 MG tablet Take  1 mg by mouth daily.   Yes [provider]  methotrexate (RHEUMATREX) 2.5 MG tablet Take 20 mg by mouth once a week. Caution:Chemotherapy. Protect from light.  Taken on Fridays.   Yes [provider]  simvastatin (ZOCOR) 40 MG tablet Take 1 tablet (40 mg total) by mouth every evening. 11/02/16  Yes Janith Lima, MD  traMADol (ULTRAM) 50 MG tablet Take 1 tablet (50 mg total) by mouth every 12 (twelve) hours as needed. Patient taking differently: Take 50 mg by mouth every 12 (twelve) hours as needed for moderate pain or severe pain.  10/23/15  Yes Janith Lima, MD  meclizine  (MEDI-MECLIZINE) 25 MG tablet Take 1 tablet (25 mg total) by mouth 3 (three) times daily as needed for dizziness. 02/27/17   Emeline General, PA-C    Family History Family History  Problem Relation Age of Onset  . Cancer Brother        lung cancer  . Coronary artery disease Other   . Heart attack Other     Social History Social History  Substance Use Topics  . Smoking status: Never Smoker  . Smokeless tobacco: Never Used  . Alcohol use No     Allergies   Ibuprofen; Percocet [oxycodone-acetaminophen]; Diltiazem hcl; Hydrochlorothiazide; Lisinopril; and Verapamil   Review of Systems Review of Systems  Constitutional: Negative for chills and fever.  HENT: Negative for ear pain, sore throat and trouble swallowing.   Eyes: Negative for photophobia, pain, redness and visual disturbance.  Respiratory: Negative for cough, shortness of breath, wheezing and stridor.   Cardiovascular: Negative for chest pain, palpitations and leg swelling.  Gastrointestinal: Positive for nausea and vomiting. Negative for abdominal pain and diarrhea.  Genitourinary: Negative for difficulty urinating, dysuria and hematuria.  Musculoskeletal: Negative for gait problem, myalgias, neck pain and neck stiffness.  Skin: Negative for color change, pallor and rash.  Neurological: Positive for dizziness. Negative for tremors, seizures, syncope, facial asymmetry, speech difficulty, weakness, light-headedness, numbness and headaches.     Physical Exam Updated Vital Signs BP (!) 154/59 (BP Location: Left Arm)   Pulse 65   Temp 97.5 F (36.4 C) (Oral)   Resp 15   SpO2 97%   Physical Exam  Constitutional: She appears well-developed and well-nourished. No distress.  Patient is afebrile, nontoxic-appearing, sitting comfortably in bed in no acute distress. She is very animated and talkative.  HENT:  Head: Normocephalic and atraumatic.  Mouth/Throat: Oropharynx is clear and moist. No oropharyngeal exudate.    Eyes: Conjunctivae and EOM are normal. Pupils are equal, round, and reactive to light. Right eye exhibits no discharge. Left eye exhibits no discharge. No scleral icterus.  Neck: Normal range of motion. Neck supple.  Cardiovascular: Normal rate, regular rhythm and normal heart sounds.   No murmur heard. Pulmonary/Chest: Effort normal and breath sounds normal. No respiratory distress. She has no wheezes. She has no rales. She exhibits no tenderness.  Abdominal: She exhibits no distension.  Musculoskeletal: Normal range of motion. She exhibits no edema or deformity.  Neurological: She is alert.  Neurologic Exam:  - Mental status: Patient is alert and cooperative. Fluent speech and words are clear. Coherent thought processes and insight is good. Patient is oriented x 4 to person, place, time and event.  - Cranial nerves:  CN III, IV, VI: pupils equally round, reactive to light both direct and conscensual and normal accommodation. Full extra-ocular movement. CN V: motor temporalis and masseter strength intact. CN VII : muscles  of facial expression intact. CN X :  midline uvula. XI strength of sternocleidomastoid and trapezius muscles 5/5, XII: tongue is midline when protruded. - Motor: No involuntary movements. Muscle tone and bulk normal throughout. Muscle strength is 5/5 in bilateral shoulder abduction, elbow flexion and extension, grip, hip extension, flexion, leg flexion and extension, ankle dorsiflexion and plantar flexion.  - Sensory: Proprioception, light tough sensation intact in all extremities.  - Cerebellar: rapid alternating movements and point to point movement intact in upper and lower extremities. Normal stance and gait. Patient walked to the bathroom and was asymptomatic.  Skin: Skin is warm. She is not diaphoretic. No erythema. No pallor.  Psychiatric: She has a normal mood and affect.  Nursing note and vitals reviewed.    ED Treatments / Results  Labs (all labs ordered are  listed, but only abnormal results are displayed) Labs Reviewed  COMPREHENSIVE METABOLIC PANEL - Abnormal; Notable for the following:       Result Value   Potassium 6.1 (*)    Glucose, Bld 193 (*)    Total Protein 8.3 (*)    AST 45 (*)    Total Bilirubin 1.5 (*)    GFR calc non Af Amer 57 (*)    All other components within normal limits  CBC - Abnormal; Notable for the following:    WBC 11.0 (*)    All other components within normal limits  URINALYSIS, ROUTINE W REFLEX MICROSCOPIC - Abnormal; Notable for the following:    Ketones, ur 5 (*)    Protein, ur 30 (*)    Squamous Epithelial / LPF 0-5 (*)    All other components within normal limits  LIPASE, BLOOD  I-STAT TROPOININ, ED    EKG  EKG Interpretation None       Radiology Dg Chest 2 View  Result Date: 02/27/2017 CLINICAL DATA:  Pt reports she felt dizziness (room spinning) 45 minutes ago and then began to have emesis. Pt reports she is no longer dizzy. But does have dry heaves. No CP or SOB.; HTN; non smoker EXAM: CHEST  2 VIEW COMPARISON:  03/10/2016 FINDINGS: The heart size and mediastinal contours are within normal limits. Both lungs are clear. No pleural effusion or pneumothorax. The visualized skeletal structures are unremarkable. IMPRESSION: No active cardiopulmonary disease. Electronically Signed   By: Lajean Manes M.D.   On: 02/27/2017 14:11   Ct Head Wo Contrast  Result Date: 02/27/2017 CLINICAL DATA:  Dizziness beginning today with vomiting. EXAM: CT HEAD WITHOUT CONTRAST TECHNIQUE: Contiguous axial images were obtained from the base of the skull through the vertex without intravenous contrast. COMPARISON:  None. FINDINGS: Brain: Ventricles, cisterns and other CSF spaces are within normal. Minimal age related atrophic change. Mild chronic ischemic microvascular disease. No evidence of mass, mass effect, shift of midline structures or acute hemorrhage. No evidence to suggest acute infarction. Vascular: No hyperdense  vessel or unexpected calcification. Skull: Within normal. Sinuses/Orbits: Within normal. Other: None. IMPRESSION: No acute intracranial findings. Mild chronic ischemic microvascular disease. Subtle age related atrophic change. Electronically Signed   By: Marin Olp M.D.   On: 02/27/2017 14:05    Procedures Procedures (including critical care time)  Medications Ordered in ED Medications  sodium chloride 0.9 % bolus 1,000 mL (1,000 mLs Intravenous New Bag/Given 02/27/17 1536)  furosemide (LASIX) injection 20 mg (20 mg Intravenous Given 02/27/17 1536)     Initial Impression / Assessment and Plan / ED Course  I have reviewed the triage vital signs  and the nursing notes.  Pertinent labs & imaging results that were available during my care of the patient were reviewed by me and considered in my medical decision making (see chart for details).    Patient presented with transient episodes of dizziness in which she felt like the room was spinning around her and the floor was unstable. No hearing loss associated with this episode and no recent illness, fever, or other symptoms. No history of head trauma following injury.  Her exam was very reassuring normal neuro no focal deficits. Her symptoms had resolved prior to arrival and she was comfortable, no dizziness, no nausea vomiting while in the ED.   CT negative for acute abnormalities. Incidental finding of hyperkalemia 6.1. EKG without abnormality and reviewed with Dr. Vanita Panda. Will give patient some fluids and Lasix to help lower her potassium.  Patient is otherwise asymptomatic and ready to go home. Discharged with meclizine and close follow-up with PCP for potassium recheck. Patient was discussed with Dr. Vanita Panda who agrees with assessment and plan. Discussed strict return precautions and advised to return to the emergency department if experiencing any new or worsening symptoms. Instructions were understood and patient agreed with discharge  plan.  Transferred patient care at end of shift to [place], PA-C pending completion of fluids and discharge with previously said plan unless new symptoms. Final Clinical Impressions(s) / ED Diagnoses   Final diagnoses:  Dizziness    New Prescriptions New Prescriptions   MECLIZINE (MEDI-MECLIZINE) 25 MG TABLET    Take 1 tablet (25 mg total) by mouth 3 (three) times daily as needed for dizziness.     Emeline General, PA-C 02/27/17 1656    Carmin Muskrat, MD 02/28/17 [phone]

## 2017-02-27 NOTE — ED Notes (Signed)
Patient transported to X-ray unable to complete xray

## 2017-02-27 NOTE — ED Notes (Signed)
Attempt once to collect labs unsuccessful  

## 2017-02-27 NOTE — ED Notes (Signed)
Took patient a bedside commode

## 2017-02-27 NOTE — ED Triage Notes (Addendum)
Pt reports she felt dizziness (room spinning) 45 minutes ago and then began to have emesis. Pt reports she is no longer dizzy. But does have dry heaves. No CP or SOB. Tried nausea medication at home, but threw it up.

## 2017-02-27 NOTE — Discharge Instructions (Signed)
As discussed, follow up with your primary care provider for potassium recheck in 2 days. Stay well-hydrated and keep your urine clear.  Take your dizziness medication only if needed. Return to the emergency department if symptoms worsen, you experience confusion, chest pain, shortness of breath, bad headache, nausea, vomiting or any new concerning symptoms in the meantime.

## 2017-03-04 ENCOUNTER — Ambulatory Visit (INDEPENDENT_AMBULATORY_CARE_PROVIDER_SITE_OTHER): Payer: Medicare HMO | Admitting: Internal Medicine

## 2017-03-04 ENCOUNTER — Encounter: Payer: Self-pay | Admitting: Internal Medicine

## 2017-03-04 ENCOUNTER — Other Ambulatory Visit (INDEPENDENT_AMBULATORY_CARE_PROVIDER_SITE_OTHER): Payer: Medicare HMO

## 2017-03-04 VITALS — BP 128/60 | HR 57 | Temp 98.4°F | Resp 16 | Ht 64.0 in | Wt 174.8 lb

## 2017-03-04 DIAGNOSIS — M5416 Radiculopathy, lumbar region: Secondary | ICD-10-CM | POA: Diagnosis not present

## 2017-03-04 DIAGNOSIS — I10 Essential (primary) hypertension: Secondary | ICD-10-CM | POA: Diagnosis not present

## 2017-03-04 LAB — BASIC METABOLIC PANEL
BUN: 20 mg/dL (ref 6–23)
CALCIUM: 9.5 mg/dL (ref 8.4–10.5)
CO2: 28 meq/L (ref 19–32)
Chloride: 106 mEq/L (ref 96–112)
Creatinine, Ser: 0.96 mg/dL (ref 0.40–1.20)
GFR: 71.29 mL/min (ref >60.00)
Glucose, Bld: 94 mg/dL (ref 70–99)
Potassium: 3.8 mEq/L (ref 3.5–5.1)
SODIUM: 140 meq/L (ref 135–145)

## 2017-03-04 NOTE — Progress Notes (Signed)
Subjective:  Patient ID: Briana [name], female    DOB: 1931-09-05  Age: 83 y.o. MRN: [id number]  CC: Hypertension   HPI Briana [name] presents for f/up after a recent visit to the emergency department where she complained of dizziness and left lower extremity numbness and tingling. She underwent a CT scan of her brain which was normal. Her potassium level was elevated up to 6.1.  Regarding the abnormal sensations in her left lower extremity she tells me that she has seen an orthopedist about this before and was told that she has some kind of an abnormality in her lower back that causes this. At that time she was told that there was no surgical intervention needed. She's had no recent episodes of low back pain or weakness in the left lower extremity. She tells me the dizzy spells are getting much better.  Outpatient Medications Prior to Visit  Medication Sig Dispense Refill  . amLODipine (NORVASC) 10 MG tablet Take 1 tablet (10 mg total) by mouth daily. 90 tablet 1  . atenolol (TENORMIN) 100 MG tablet Take 1 tablet (100 mg total) by mouth daily. 90 tablet 1  . Cholecalciferol (VITAMIN D) 2000 UNITS tablet Take 2,000 Units by mouth daily.    . fexofenadine (ALLEGRA) 180 MG tablet Take 180 mg by mouth daily.    . folic acid (FOLVITE) 1 MG tablet Take 1 mg by mouth daily.    . meclizine (MEDI-MECLIZINE) 25 MG tablet Take 1 tablet (25 mg total) by mouth 3 (three) times daily as needed for dizziness. 30 tablet 0  . methotrexate (RHEUMATREX) 2.5 MG tablet Take 20 mg by mouth once a week. Caution:Chemotherapy. Protect from light.  Taken on Fridays.    . simvastatin (ZOCOR) 40 MG tablet Take 1 tablet (40 mg total) by mouth every evening. 90 tablet 1  . traMADol (ULTRAM) 50 MG tablet Take 1 tablet (50 mg total) by mouth every 12 (twelve) hours as needed. (Patient taking differently: Take 50 mg by mouth every 12 (twelve) hours as needed for moderate pain or severe pain. ) 180 tablet 1   No  facility-administered medications prior to visit.     ROS Review of Systems  Constitutional: Negative for appetite change, chills, fatigue and unexpected weight change.  HENT: Negative.   Eyes: Negative for visual disturbance.  Respiratory: Negative for cough, chest tightness, shortness of breath and wheezing.   Cardiovascular: Negative for chest pain, palpitations and leg swelling.  Gastrointestinal: Negative for abdominal pain, constipation, diarrhea, nausea and vomiting.  Genitourinary: Negative.  Negative for difficulty urinating.  Musculoskeletal: Negative.  Negative for back pain and myalgias.  Skin: Negative.   Neurological: Positive for dizziness and numbness. Negative for tremors, weakness, light-headedness and headaches.  Hematological: Negative for adenopathy. Does not bruise/bleed easily.  Psychiatric/Behavioral: Negative.     Objective:  BP 128/60 (BP Location: Left Arm, Patient Position: Sitting, Cuff Size: Large)   Pulse (!) 57   Temp 98.4 F (36.9 C) (Oral)   Resp 16   Ht 5\' 4"  (1.626 m)   Wt 174 lb 12 oz (79.3 kg)   SpO2 98%   BMI 30.00 kg/m   BP Readings from Last 3 Encounters:  03/04/17 128/60  02/27/17 (!) 147/53  11/02/16 120/60    Wt Readings from Last 3 Encounters:  03/04/17 174 lb 12 oz (79.3 kg)  11/02/16 182 lb (82.6 kg)  03/10/16 177 lb (80.3 kg)    Physical Exam  Constitutional: She is oriented  to person, place, and time. No distress.  HENT:  Mouth/Throat: Oropharynx is clear and moist. No oropharyngeal exudate.  Eyes: Conjunctivae are normal. Right eye exhibits no discharge. Left eye exhibits no discharge. No scleral icterus.  Neck: Normal range of motion. Neck supple. No JVD present. No tracheal deviation present. No thyromegaly present.  Cardiovascular: Normal rate, regular rhythm, normal heart sounds and intact distal pulses.  Exam reveals no gallop and no friction rub.   No murmur heard. Pulmonary/Chest: Effort normal and breath  sounds normal. No stridor. No respiratory distress. She has no wheezes. She has no rales. She exhibits no tenderness.  Abdominal: Soft. Bowel sounds are normal. She exhibits no distension and no mass. There is no tenderness. There is no rebound and no guarding.  Musculoskeletal: Normal range of motion. She exhibits no edema, tenderness or deformity.  Lymphadenopathy:    She has no cervical adenopathy.  Neurological: She is alert and oriented to person, place, and time. She displays no atrophy, no tremor and normal reflexes. No cranial nerve deficit or sensory deficit. She exhibits normal muscle tone. She displays no seizure activity. Coordination and gait normal.  Reflex Scores:      Tricep reflexes are 0 on the right side and 0 on the left side.      Bicep reflexes are 0 on the right side and 0 on the left side.      Brachioradialis reflexes are 0 on the right side and 0 on the left side.      Patellar reflexes are 0 on the right side and 0 on the left side.      Achilles reflexes are 0 on the right side and 0 on the left side. Neg SLR in BLE  Skin: Skin is warm and dry. No rash noted. She is not diaphoretic. No erythema. No pallor.    Lab Results  Component Value Date   WBC 11.0 (H) 02/27/2017   HGB 13.3 02/27/2017   HCT 39.3 02/27/2017   PLT 170 02/27/2017   GLUCOSE 94 03/04/2017   CHOL 218 (H) 10/23/2015   TRIG 101.0 10/23/2015   HDL 111.50 10/23/2015   LDLDIRECT 104.5 04/25/2010   LDLCALC 86 10/23/2015   ALT 18 02/27/2017   AST 45 (H) 02/27/2017   NA 140 03/04/2017   K 3.8 03/04/2017   CL 106 03/04/2017   CREATININE 0.96 03/04/2017   BUN 20 03/04/2017   CO2 28 03/04/2017   TSH 1.67 11/02/2016   INR 0.92 06/05/2014   HGBA1C 6.2 11/02/2016    Dg Chest 2 View  Result Date: 02/27/2017 CLINICAL DATA:  Pt reports she felt dizziness (room spinning) 45 minutes ago and then began to have emesis. Pt reports she is no longer dizzy. But does have dry heaves. No CP or SOB.; HTN;  non smoker EXAM: CHEST  2 VIEW COMPARISON:  03/10/2016 FINDINGS: The heart size and mediastinal contours are within normal limits. Both lungs are clear. No pleural effusion or pneumothorax. The visualized skeletal structures are unremarkable. IMPRESSION: No active cardiopulmonary disease. Electronically Signed   By: Lajean Manes M.D.   On: 02/27/2017 14:11   Ct Head Wo Contrast  Result Date: 02/27/2017 CLINICAL DATA:  Dizziness beginning today with vomiting. EXAM: CT HEAD WITHOUT CONTRAST TECHNIQUE: Contiguous axial images were obtained from the base of the skull through the vertex without intravenous contrast. COMPARISON:  None. FINDINGS: Brain: Ventricles, cisterns and other CSF spaces are within normal. Minimal age related atrophic change. Mild chronic ischemic  microvascular disease. No evidence of mass, mass effect, shift of midline structures or acute hemorrhage. No evidence to suggest acute infarction. Vascular: No hyperdense vessel or unexpected calcification. Skull: Within normal. Sinuses/Orbits: Within normal. Other: None. IMPRESSION: No acute intracranial findings. Mild chronic ischemic microvascular disease. Subtle age related atrophic change. Electronically Signed   By: Marin Olp M.D.   On: 02/27/2017 14:05    Assessment & Plan:   Yomaira was seen today for hypertension.  Diagnoses and all orders for this visit:  Essential hypertension, benign- her blood pressure is well-controlled, her potassium level is normal now. -     Basic metabolic panel; Future  Left lumbar radiculitis- she wants to follow-up with the orthopedic surgeon about this. She most likely has degenerative changes in her lower back that has caused this but her exam today is negative for any focal abnormalities.   I am having Briana [name] maintain her folic acid, methotrexate, Vitamin D, fexofenadine, traMADol, simvastatin, amLODipine, atenolol, and meclizine.  No orders of the defined types were placed in this  encounter.    Follow-up: Return in about 6 months (around 09/04/2017).  Scarlette Calico, MD

## 2017-03-04 NOTE — Patient Instructions (Signed)

## 2017-03-07 DIAGNOSIS — M5416 Radiculopathy, lumbar region: Secondary | ICD-10-CM | POA: Insufficient documentation

## 2017-03-10 DIAGNOSIS — M545 Low back pain: Secondary | ICD-10-CM | POA: Diagnosis not present

## 2017-03-23 DIAGNOSIS — M5416 Radiculopathy, lumbar region: Secondary | ICD-10-CM | POA: Diagnosis not present

## 2017-03-23 DIAGNOSIS — M48061 Spinal stenosis, lumbar region without neurogenic claudication: Secondary | ICD-10-CM | POA: Diagnosis not present

## 2017-03-23 DIAGNOSIS — M545 Low back pain: Secondary | ICD-10-CM | POA: Diagnosis not present

## 2017-04-09 DIAGNOSIS — M48061 Spinal stenosis, lumbar region without neurogenic claudication: Secondary | ICD-10-CM | POA: Diagnosis not present

## 2017-04-09 DIAGNOSIS — M545 Low back pain: Secondary | ICD-10-CM | POA: Diagnosis not present

## 2017-04-09 DIAGNOSIS — M5416 Radiculopathy, lumbar region: Secondary | ICD-10-CM | POA: Diagnosis not present

## 2017-04-16 ENCOUNTER — Telehealth: Payer: Self-pay

## 2017-04-16 DIAGNOSIS — E785 Hyperlipidemia, unspecified: Secondary | ICD-10-CM

## 2017-04-16 DIAGNOSIS — I10 Essential (primary) hypertension: Secondary | ICD-10-CM

## 2017-04-16 MED ORDER — AMLODIPINE BESYLATE 10 MG PO TABS: 10.0000 mg | ORAL_TABLET | Freq: Every day | ORAL | 1 refills | Status: DC

## 2017-04-16 MED ORDER — SIMVASTATIN 40 MG PO TABS: 40.0000 mg | ORAL_TABLET | Freq: Every evening | ORAL | 1 refills | Status: DC

## 2017-04-16 NOTE — Telephone Encounter (Signed)
erx rq for simvastatin and amlodipine.   erx sent to [facility].

## 2017-04-22 DIAGNOSIS — E669 Obesity, unspecified: Secondary | ICD-10-CM | POA: Diagnosis not present

## 2017-04-22 DIAGNOSIS — M2550 Pain in unspecified joint: Secondary | ICD-10-CM | POA: Diagnosis not present

## 2017-04-22 DIAGNOSIS — M150 Primary generalized (osteo)arthritis: Secondary | ICD-10-CM | POA: Diagnosis not present

## 2017-04-22 DIAGNOSIS — M0609 Rheumatoid arthritis without rheumatoid factor, multiple sites: Secondary | ICD-10-CM | POA: Diagnosis not present

## 2017-04-22 DIAGNOSIS — Z6831 Body mass index (BMI) 31.0-31.9, adult: Secondary | ICD-10-CM | POA: Diagnosis not present

## 2017-04-22 DIAGNOSIS — M5416 Radiculopathy, lumbar region: Secondary | ICD-10-CM | POA: Diagnosis not present

## 2017-04-22 DIAGNOSIS — Z79899 Other long term (current) drug therapy: Secondary | ICD-10-CM | POA: Diagnosis not present

## 2017-04-22 LAB — BASIC METABOLIC PANEL
BUN: 20 (ref 4–21)
Creatinine: 0.9 (ref 0.5–1.1)
Glucose: 116
POTASSIUM: 4 (ref 3.4–5.3)
SODIUM: 144 (ref 137–147)

## 2017-04-22 LAB — CBC AND DIFFERENTIAL
HCT: 36 (ref 36–46)
Hemoglobin: 11.9 — AB (ref 12.0–16.0)
Neutrophils Absolute: 5
Platelets: 202 (ref 150–399)
WBC: 6.9

## 2017-04-22 LAB — HEPATIC FUNCTION PANEL
ALT: 30 (ref 7–35)
AST: 29 (ref 13–35)
Alkaline Phosphatase: 104 (ref 25–125)
BILIRUBIN, TOTAL: 0.5

## 2017-04-27 ENCOUNTER — Encounter: Payer: Self-pay | Admitting: Internal Medicine

## 2017-04-27 DIAGNOSIS — M5416 Radiculopathy, lumbar region: Secondary | ICD-10-CM | POA: Diagnosis not present

## 2017-04-27 DIAGNOSIS — M48061 Spinal stenosis, lumbar region without neurogenic claudication: Secondary | ICD-10-CM | POA: Diagnosis not present

## 2017-04-27 DIAGNOSIS — M545 Low back pain: Secondary | ICD-10-CM | POA: Diagnosis not present

## 2017-05-07 DIAGNOSIS — M48061 Spinal stenosis, lumbar region without neurogenic claudication: Secondary | ICD-10-CM | POA: Diagnosis not present

## 2017-05-07 DIAGNOSIS — M5416 Radiculopathy, lumbar region: Secondary | ICD-10-CM | POA: Diagnosis not present

## 2017-06-08 DIAGNOSIS — M545 Low back pain: Secondary | ICD-10-CM | POA: Diagnosis not present

## 2017-06-08 DIAGNOSIS — M5416 Radiculopathy, lumbar region: Secondary | ICD-10-CM | POA: Diagnosis not present

## 2017-06-08 DIAGNOSIS — M48061 Spinal stenosis, lumbar region without neurogenic claudication: Secondary | ICD-10-CM | POA: Diagnosis not present

## 2017-07-21 ENCOUNTER — Other Ambulatory Visit: Payer: Self-pay | Admitting: Internal Medicine

## 2017-07-21 DIAGNOSIS — I10 Essential (primary) hypertension: Secondary | ICD-10-CM

## 2017-07-22 DIAGNOSIS — M0609 Rheumatoid arthritis without rheumatoid factor, multiple sites: Secondary | ICD-10-CM | POA: Diagnosis not present

## 2017-07-22 DIAGNOSIS — Z6830 Body mass index (BMI) 30.0-30.9, adult: Secondary | ICD-10-CM | POA: Diagnosis not present

## 2017-07-22 DIAGNOSIS — M2550 Pain in unspecified joint: Secondary | ICD-10-CM | POA: Diagnosis not present

## 2017-07-22 DIAGNOSIS — M150 Primary generalized (osteo)arthritis: Secondary | ICD-10-CM | POA: Diagnosis not present

## 2017-07-22 DIAGNOSIS — Z79899 Other long term (current) drug therapy: Secondary | ICD-10-CM | POA: Diagnosis not present

## 2017-07-22 DIAGNOSIS — M5416 Radiculopathy, lumbar region: Secondary | ICD-10-CM | POA: Diagnosis not present

## 2017-07-22 DIAGNOSIS — E669 Obesity, unspecified: Secondary | ICD-10-CM | POA: Diagnosis not present

## 2017-07-27 ENCOUNTER — Telehealth: Payer: Self-pay | Admitting: Internal Medicine

## 2017-07-27 NOTE — Telephone Encounter (Signed)
amLODipine (NORVASC) 10 MG tablet   Patient states that she saw her medication has a recall on it. She would like to know what she needs to do about this. Please follow up with patient. Thank you.

## 2017-07-27 NOTE — Telephone Encounter (Signed)
Can you check with the pharmacy about this? I have not heard of a recall.

## 2017-07-30 NOTE — Telephone Encounter (Signed)
Spoke with the pharmacy. The medication has not be recalled. The RX was just sent out to the patient yesterday for the refill.   Called patient and informed her of this information. She understood. States it must of been a misunderstanding.

## 2017-09-08 DIAGNOSIS — H10413 Chronic giant papillary conjunctivitis, bilateral: Secondary | ICD-10-CM | POA: Diagnosis not present

## 2017-09-08 DIAGNOSIS — H04123 Dry eye syndrome of bilateral lacrimal glands: Secondary | ICD-10-CM | POA: Diagnosis not present

## 2017-09-08 DIAGNOSIS — Z961 Presence of intraocular lens: Secondary | ICD-10-CM | POA: Diagnosis not present

## 2017-09-08 DIAGNOSIS — H43813 Vitreous degeneration, bilateral: Secondary | ICD-10-CM | POA: Diagnosis not present

## 2017-09-08 DIAGNOSIS — H26493 Other secondary cataract, bilateral: Secondary | ICD-10-CM | POA: Diagnosis not present

## 2017-09-22 ENCOUNTER — Other Ambulatory Visit: Payer: Self-pay | Admitting: Internal Medicine

## 2017-09-22 DIAGNOSIS — I10 Essential (primary) hypertension: Secondary | ICD-10-CM

## 2017-11-04 DIAGNOSIS — Z6830 Body mass index (BMI) 30.0-30.9, adult: Secondary | ICD-10-CM | POA: Diagnosis not present

## 2017-11-04 DIAGNOSIS — Z79899 Other long term (current) drug therapy: Secondary | ICD-10-CM | POA: Diagnosis not present

## 2017-11-04 DIAGNOSIS — M2550 Pain in unspecified joint: Secondary | ICD-10-CM | POA: Diagnosis not present

## 2017-11-04 DIAGNOSIS — M5416 Radiculopathy, lumbar region: Secondary | ICD-10-CM | POA: Diagnosis not present

## 2017-11-04 DIAGNOSIS — M0609 Rheumatoid arthritis without rheumatoid factor, multiple sites: Secondary | ICD-10-CM | POA: Diagnosis not present

## 2017-11-04 DIAGNOSIS — M150 Primary generalized (osteo)arthritis: Secondary | ICD-10-CM | POA: Diagnosis not present

## 2017-11-04 DIAGNOSIS — M65332 Trigger finger, left middle finger: Secondary | ICD-10-CM | POA: Diagnosis not present

## 2017-11-04 DIAGNOSIS — E669 Obesity, unspecified: Secondary | ICD-10-CM | POA: Diagnosis not present

## 2017-11-25 ENCOUNTER — Encounter: Payer: Medicare HMO | Admitting: Internal Medicine

## 2017-12-02 ENCOUNTER — Ambulatory Visit (INDEPENDENT_AMBULATORY_CARE_PROVIDER_SITE_OTHER): Payer: Medicare HMO | Admitting: Internal Medicine

## 2017-12-02 ENCOUNTER — Encounter: Payer: Self-pay | Admitting: Internal Medicine

## 2017-12-02 ENCOUNTER — Other Ambulatory Visit (INDEPENDENT_AMBULATORY_CARE_PROVIDER_SITE_OTHER): Payer: Medicare HMO

## 2017-12-02 VITALS — BP 150/80 | HR 59 | Temp 97.9°F | Ht 64.0 in | Wt 175.0 lb

## 2017-12-02 DIAGNOSIS — E785 Hyperlipidemia, unspecified: Secondary | ICD-10-CM

## 2017-12-02 DIAGNOSIS — R7309 Other abnormal glucose: Secondary | ICD-10-CM

## 2017-12-02 DIAGNOSIS — M170 Bilateral primary osteoarthritis of knee: Secondary | ICD-10-CM | POA: Diagnosis not present

## 2017-12-02 DIAGNOSIS — M05712 Rheumatoid arthritis with rheumatoid factor of left shoulder without organ or systems involvement: Secondary | ICD-10-CM

## 2017-12-02 DIAGNOSIS — I10 Essential (primary) hypertension: Secondary | ICD-10-CM

## 2017-12-02 DIAGNOSIS — M05711 Rheumatoid arthritis with rheumatoid factor of right shoulder without organ or systems involvement: Secondary | ICD-10-CM | POA: Diagnosis not present

## 2017-12-02 LAB — THYROID PANEL WITH TSH
Free Thyroxine Index: 2.1 (ref 1.4–3.8)
T3 UPTAKE: 27 % (ref 22–35)
T4 TOTAL: 7.9 ug/dL (ref 5.1–11.9)
TSH: 1.38 mIU/L (ref 0.40–4.50)

## 2017-12-02 LAB — CBC WITH DIFFERENTIAL/PLATELET
Basophils Absolute: 0.1 10*3/uL (ref 0.0–0.1)
Basophils Relative: 0.8 % (ref 0.0–3.0)
EOS PCT: 1.5 % (ref 0.0–5.0)
Eosinophils Absolute: 0.1 10*3/uL (ref 0.0–0.7)
HEMATOCRIT: 40 % (ref 36.0–46.0)
HEMOGLOBIN: 13.5 g/dL (ref 12.0–15.0)
LYMPHS PCT: 17 % (ref 12.0–46.0)
Lymphs Abs: 1.4 10*3/uL (ref 0.7–4.0)
MCHC: 33.7 g/dL (ref 30.0–36.0)
MCV: 95.6 fl (ref 78.0–100.0)
MONO ABS: 0.5 10*3/uL (ref 0.1–1.0)
Monocytes Relative: 6.2 % (ref 3.0–12.0)
Neutro Abs: 6.1 10*3/uL (ref 1.4–7.7)
Neutrophils Relative %: 74.5 % (ref 43.0–77.0)
Platelets: 193 10*3/uL (ref 150.0–400.0)
RBC: 4.18 Mil/uL (ref 3.87–5.11)
RDW: 15 % (ref 11.5–15.5)
WBC: 8.2 10*3/uL (ref 4.0–10.5)

## 2017-12-02 LAB — LIPID PANEL
Cholesterol: 193 mg/dL (ref 0–200)
HDL: 100.9 mg/dL (ref >39.00)
LDL CALC: 77 mg/dL (ref 0–99)
NonHDL: 92.47
Total CHOL/HDL Ratio: 2
Triglycerides: 76 mg/dL (ref 0.0–149.0)
VLDL: 15.2 mg/dL (ref 0.0–40.0)

## 2017-12-02 LAB — COMPREHENSIVE METABOLIC PANEL
ALK PHOS: 88 U/L (ref 39–117)
ALT: 19 U/L (ref 0–35)
AST: 20 U/L (ref 0–37)
Albumin: 4.1 g/dL (ref 3.5–5.2)
BUN: 20 mg/dL (ref 6–23)
CALCIUM: 10 mg/dL (ref 8.4–10.5)
CO2: 31 mEq/L (ref 19–32)
Chloride: 103 mEq/L (ref 96–112)
Creatinine, Ser: 0.97 mg/dL (ref 0.40–1.20)
GFR: 70.32 mL/min (ref >60.00)
Glucose, Bld: 128 mg/dL — ABNORMAL HIGH (ref 70–99)
POTASSIUM: 4.3 meq/L (ref 3.5–5.1)
Sodium: 140 mEq/L (ref 135–145)
TOTAL PROTEIN: 7.5 g/dL (ref 6.0–8.3)
Total Bilirubin: 0.5 mg/dL (ref 0.2–1.2)

## 2017-12-02 LAB — HEMOGLOBIN A1C: HEMOGLOBIN A1C: 6.2 % (ref 4.6–6.5)

## 2017-12-02 MED ORDER — AMLODIPINE BESYLATE 10 MG PO TABS: 10.0000 mg | ORAL_TABLET | Freq: Every day | ORAL | 1 refills | Status: DC

## 2017-12-02 MED ORDER — SIMVASTATIN 40 MG PO TABS: 40.0000 mg | ORAL_TABLET | Freq: Every evening | ORAL | 1 refills | Status: DC

## 2017-12-02 MED ORDER — ATENOLOL 100 MG PO TABS: 100.0000 mg | ORAL_TABLET | Freq: Every day | ORAL | 1 refills | Status: DC

## 2017-12-02 MED ORDER — TRAMADOL HCL 50 MG PO TABS: 50.0000 mg | ORAL_TABLET | Freq: Two times a day (BID) | ORAL | 1 refills | Status: DC | PRN

## 2017-12-02 NOTE — Patient Instructions (Signed)

## 2017-12-02 NOTE — Progress Notes (Signed)
Subjective:  Patient ID: Briana [name], female    DOB: [birth year]  Age: 84 y.o. MRN: [id number]  CC: Hypertension and Hyperlipidemia   HPI Briana [name] presents for f/up - She tells me she is doing well.  She is sad that her sister recently died from metastatic ovarian cancer.  The patient tells me her blood pressure has been well controlled.  She denies any recent episodes of headache/blurred vision/chest pain/shortness of breath/palpitations/edema/fatigue.  She does complain of chronic joint pain and wants a refill of tramadol.  Outpatient Medications Prior to Visit  Medication Sig Dispense Refill  . Cholecalciferol (VITAMIN D) 2000 UNITS tablet Take 2,000 Units by mouth daily.    . fexofenadine (ALLEGRA) 180 MG tablet Take 180 mg by mouth daily.    . folic acid (FOLVITE) 1 MG tablet Take 1 mg by mouth daily.    . methotrexate (RHEUMATREX) 2.5 MG tablet Take 20 mg by mouth once a week. Caution:Chemotherapy. Protect from light.  Taken on Fridays.    Marland Kitchen amLODipine (NORVASC) 10 MG tablet TAKE 1 TABLET (10 MG TOTAL) BY MOUTH DAILY. 90 tablet 1  . atenolol (TENORMIN) 100 MG tablet TAKE 1 TABLET EVERY DAY 90 tablet 1  . simvastatin (ZOCOR) 40 MG tablet Take 1 tablet (40 mg total) by mouth every evening. 90 tablet 1  . traMADol (ULTRAM) 50 MG tablet Take 1 tablet (50 mg total) by mouth every 12 (twelve) hours as needed. (Patient taking differently: Take 50 mg by mouth every 12 (twelve) hours as needed for moderate pain or severe pain. ) 180 tablet 1  . meclizine (MEDI-MECLIZINE) 25 MG tablet Take 1 tablet (25 mg total) by mouth 3 (three) times daily as needed for dizziness. (Patient not taking: Reported on 12/02/2017) 30 tablet 0   No facility-administered medications prior to visit.     ROS Review of Systems  Constitutional: Negative for appetite change, diaphoresis, fatigue and unexpected weight change.  HENT: Negative.  Negative for trouble swallowing.   Eyes: Negative for visual  disturbance.  Respiratory: Negative for cough, chest tightness, shortness of breath and wheezing.   Cardiovascular: Negative for chest pain, palpitations and leg swelling.  Gastrointestinal: Negative for abdominal pain, constipation, diarrhea, nausea and vomiting.  Endocrine: Negative.   Genitourinary: Negative.  Negative for difficulty urinating.  Musculoskeletal: Positive for arthralgias. Negative for back pain, joint swelling, myalgias and neck pain.  Skin: Negative.   Allergic/Immunologic: Negative.   Neurological: Negative.  Negative for dizziness, weakness, light-headedness, numbness and headaches.  Hematological: Negative for adenopathy. Does not bruise/bleed easily.  Psychiatric/Behavioral: Negative.  Negative for decreased concentration, dysphoric mood and suicidal ideas.    Objective:  BP (!) 150/80 (BP Location: Left Arm, Patient Position: Sitting, Cuff Size: Large)   Pulse (!) 59   Temp 97.9 F (36.6 C) (Oral)   Ht 5\' 4"  (1.626 m)   Wt 175 lb (79.4 kg)   SpO2 97%   BMI 30.04 kg/m   BP Readings from Last 3 Encounters:  12/02/17 (!) 150/80  03/04/17 128/60  02/27/17 (!) 147/53    Wt Readings from Last 3 Encounters:  12/02/17 175 lb (79.4 kg)  03/04/17 174 lb 12 oz (79.3 kg)  11/02/16 182 lb (82.6 kg)    Physical Exam  Constitutional: She is oriented to person, place, and time. No distress.  HENT:  Mouth/Throat: Oropharynx is clear and moist. No oropharyngeal exudate.  Eyes: Conjunctivae are normal. Left eye exhibits no discharge. No scleral icterus.  Neck:  Normal range of motion. Neck supple. No JVD present. No thyromegaly present.  Cardiovascular: Normal rate, regular rhythm and normal heart sounds. Exam reveals no gallop.  No murmur heard. Pulmonary/Chest: Effort normal and breath sounds normal. No respiratory distress. She has no wheezes. She has no rales.  Abdominal: Soft. Bowel sounds are normal. She exhibits no distension and no mass. There is no  tenderness. There is no guarding.  Musculoskeletal: Normal range of motion. She exhibits no edema, tenderness or deformity.  Lymphadenopathy:    She has no cervical adenopathy.  Neurological: She is alert and oriented to person, place, and time.  Skin: Skin is warm and dry. No rash noted. She is not diaphoretic. No erythema. No pallor.  Vitals reviewed.   Lab Results  Component Value Date   WBC 8.2 12/02/2017   HGB 13.5 12/02/2017   HCT 40.0 12/02/2017   PLT 193.0 12/02/2017   GLUCOSE 128 (H) 12/02/2017   CHOL 193 12/02/2017   TRIG 76.0 12/02/2017   HDL 100.90 12/02/2017   LDLDIRECT 104.5 04/25/2010   LDLCALC 77 12/02/2017   ALT 19 12/02/2017   AST 20 12/02/2017   NA 140 12/02/2017   K 4.3 12/02/2017   CL 103 12/02/2017   CREATININE 0.97 12/02/2017   BUN 20 12/02/2017   CO2 31 12/02/2017   TSH 1.38 12/02/2017   INR 0.92 06/05/2014   HGBA1C 6.2 12/02/2017    Dg Chest 2 View  Result Date: 02/27/2017 CLINICAL DATA:  Pt reports she felt dizziness (room spinning) 45 minutes ago and then began to have emesis. Pt reports she is no longer dizzy. But does have dry heaves. No CP or SOB.; HTN; non smoker EXAM: CHEST  2 VIEW COMPARISON:  03/10/2016 FINDINGS: The heart size and mediastinal contours are within normal limits. Both lungs are clear. No pleural effusion or pneumothorax. The visualized skeletal structures are unremarkable. IMPRESSION: No active cardiopulmonary disease. Electronically Signed   By: Lajean Manes M.D.   On: 02/27/2017 14:11   Ct Head Wo Contrast  Result Date: 02/27/2017 CLINICAL DATA:  Dizziness beginning today with vomiting. EXAM: CT HEAD WITHOUT CONTRAST TECHNIQUE: Contiguous axial images were obtained from the base of the skull through the vertex without intravenous contrast. COMPARISON:  None. FINDINGS: Brain: Ventricles, cisterns and other CSF spaces are within normal. Minimal age related atrophic change. Mild chronic ischemic microvascular disease. No  evidence of mass, mass effect, shift of midline structures or acute hemorrhage. No evidence to suggest acute infarction. Vascular: No hyperdense vessel or unexpected calcification. Skull: Within normal. Sinuses/Orbits: Within normal. Other: None. IMPRESSION: No acute intracranial findings. Mild chronic ischemic microvascular disease. Subtle age related atrophic change. Electronically Signed   By: Marin Olp M.D.   On: 02/27/2017 14:05    Assessment & Plan:   Briana [name] was seen today for hypertension and hyperlipidemia.  Diagnoses and all orders for this visit:  Other abnormal glucose- Her A1c is up to 6.2%.  She is prediabetic.  Medical therapy is not indicated. -     Comprehensive metabolic panel; Future -     Hemoglobin A1c; Future  Hyperlipidemia with target LDL less than 130- She has achieved her LDL goal and is doing well on the statin. -     simvastatin (ZOCOR) 40 MG tablet; Take 1 tablet (40 mg total) by mouth every evening. -     Lipid panel; Future -     Thyroid Panel With TSH; Future  Essential hypertension, benign- Her blood pressure is  well controlled.  Electrolytes and renal function are normal.  She will continue the combination of amlodipine and atenolol. -     amLODipine (NORVASC) 10 MG tablet; Take 1 tablet (10 mg total) by mouth daily. -     atenolol (TENORMIN) 100 MG tablet; Take 1 tablet (100 mg total) by mouth daily. -     CBC with Differential/Platelet; Future -     Comprehensive metabolic panel; Future  Primary osteoarthritis of both knees -     traMADol (ULTRAM) 50 MG tablet; Take 1 tablet (50 mg total) by mouth every 12 (twelve) hours as needed for moderate pain or severe pain.  Rheumatoid arthritis involving both shoulders with positive rheumatoid factor (HCC) -     traMADol (ULTRAM) 50 MG tablet; Take 1 tablet (50 mg total) by mouth every 12 (twelve) hours as needed for moderate pain or severe pain.   I have discontinued Briana [name]'s meclizine. I have  also changed her atenolol and traMADol. Additionally, I am having her maintain her folic acid, methotrexate, Vitamin D, fexofenadine, simvastatin, and amLODipine.  Meds ordered this encounter  Medications  . simvastatin (ZOCOR) 40 MG tablet    Sig: Take 1 tablet (40 mg total) by mouth every evening.    Dispense:  90 tablet    Refill:  1  . amLODipine (NORVASC) 10 MG tablet    Sig: Take 1 tablet (10 mg total) by mouth daily.    Dispense:  90 tablet    Refill:  1  . atenolol (TENORMIN) 100 MG tablet    Sig: Take 1 tablet (100 mg total) by mouth daily.    Dispense:  90 tablet    Refill:  1  . traMADol (ULTRAM) 50 MG tablet    Sig: Take 1 tablet (50 mg total) by mouth every 12 (twelve) hours as needed for moderate pain or severe pain.    Dispense:  180 tablet    Refill:  1     Follow-up: Return in about 6 months (around 06/01/2018).  Scarlette Calico, MD

## 2017-12-09 ENCOUNTER — Telehealth: Payer: Self-pay | Admitting: Internal Medicine

## 2017-12-09 NOTE — Telephone Encounter (Signed)
Copied from [place] [id number]. Topic: Quick Communication - See Telephone Encounter >> Dec 09, 2017  3:59 PM Percell Belt A wrote: CRM for notification. See Telephone encounter for:  pt called in and would like someone to call her about her labs that she did on 12/02/2017   Best number  [phone]  12/09/17.

## 2017-12-10 NOTE — Telephone Encounter (Signed)
Routing to dr Ronnald Ramp, I dont see any advisement on labs---please advise, I will call patient back, thanks

## 2017-12-10 NOTE — Telephone Encounter (Signed)
Lab Results  Component Value Date   WBC 8.2 12/02/2017   HGB 13.5 12/02/2017   HCT 40.0 12/02/2017   PLT 193.0 12/02/2017   GLUCOSE 128 (H) 12/02/2017   CHOL 193 12/02/2017   TRIG 76.0 12/02/2017   HDL 100.90 12/02/2017   LDLDIRECT 104.5 04/25/2010   LDLCALC 77 12/02/2017   ALT 19 12/02/2017   AST 20 12/02/2017   NA 140 12/02/2017   K 4.3 12/02/2017   CL 103 12/02/2017   CREATININE 0.97 12/02/2017   BUN 20 12/02/2017   CO2 31 12/02/2017   TSH 1.38 12/02/2017   INR 0.92 06/05/2014   HGBA1C 6.2 12/02/2017    Her blood sugar was mildly elevated consistent with prediabetes. This does not need to be treated with a medication. The other labs are okay. I do not see any other concerns here.

## 2017-12-10 NOTE — Telephone Encounter (Signed)
Advised patient of dr Ronnald Ramp note/instructions--patient will work on better diet

## 2018-01-13 DIAGNOSIS — M0609 Rheumatoid arthritis without rheumatoid factor, multiple sites: Secondary | ICD-10-CM | POA: Diagnosis not present

## 2018-01-13 DIAGNOSIS — E663 Overweight: Secondary | ICD-10-CM | POA: Diagnosis not present

## 2018-01-13 DIAGNOSIS — Z79899 Other long term (current) drug therapy: Secondary | ICD-10-CM | POA: Diagnosis not present

## 2018-01-13 DIAGNOSIS — M150 Primary generalized (osteo)arthritis: Secondary | ICD-10-CM | POA: Diagnosis not present

## 2018-01-13 DIAGNOSIS — M2550 Pain in unspecified joint: Secondary | ICD-10-CM | POA: Diagnosis not present

## 2018-01-13 DIAGNOSIS — Z6829 Body mass index (BMI) 29.0-29.9, adult: Secondary | ICD-10-CM | POA: Diagnosis not present

## 2018-02-24 ENCOUNTER — Ambulatory Visit: Payer: Self-pay

## 2018-02-24 NOTE — Telephone Encounter (Signed)
Pt. C/o vertigo - states she has had this "a long time ago." States she had an episode Sunday and feels " a little dizzy today. I don't want it to get too bad." No availability today per FC. Appointment made for tomorrow morning. Reason for Disposition . [1] MODERATE dizziness (e.g., vertigo; feels very unsteady, interferes with normal activities) AND [2] has NOT been evaluated by physician for this  Answer Assessment - Initial Assessment Questions 1. DESCRIPTION: "Describe your dizziness."     Vertigo 2. VERTIGO: "Do you feel like either you or the room is spinning or tilting?"      Yes 3. LIGHTHEADED: "Do you feel lightheaded?" (e.g., somewhat faint, woozy, weak upon standing)     Dizziness 4. SEVERITY: "How bad is it?"  "Can you walk?"   - MILD - Feels unsteady but walking normally.   - MODERATE - Feels very unsteady when walking, but not falling; interferes with normal activities (e.g., school, work) .   - SEVERE - Unable to walk without falling (requires assistance).     Mild 5. ONSET:  "When did the dizziness begin?"     Sunday 6. AGGRAVATING FACTORS: "Does anything make it worse?" (e.g., standing, change in head position)Moving to much     7. CAUSE: "What do you think is causing the dizziness?"     Vertigo 8. RECURRENT SYMPTOM: "Have you had dizziness before?" If so, ask: "When was the last time?" "What happened that time?"     Yes 9. OTHER SYMPTOMS: "Do you have any other symptoms?" (e.g., headache, weakness, numbness, vomiting, earache)     No 10. PREGNANCY: "Is there any chance you are pregnant?" "When was your last menstrual period?"       No  Protocols used: DIZZINESS - VERTIGO-A-AH

## 2018-02-24 NOTE — Telephone Encounter (Signed)
(  FYI)  She has appointment for tomorrow.  Carla~

## 2018-02-25 ENCOUNTER — Encounter: Payer: Self-pay | Admitting: Family

## 2018-02-25 ENCOUNTER — Ambulatory Visit (INDEPENDENT_AMBULATORY_CARE_PROVIDER_SITE_OTHER): Payer: Medicare HMO | Admitting: Family

## 2018-02-25 VITALS — BP 140/60 | HR 68 | Temp 98.1°F | Ht 64.0 in | Wt 175.0 lb

## 2018-02-25 DIAGNOSIS — H6123 Impacted cerumen, bilateral: Secondary | ICD-10-CM

## 2018-02-25 DIAGNOSIS — R42 Dizziness and giddiness: Secondary | ICD-10-CM

## 2018-02-25 MED ORDER — ONDANSETRON 4 MG PO TBDP: 4.0000 mg | ORAL_TABLET | Freq: Three times a day (TID) | ORAL | 0 refills | Status: DC | PRN

## 2018-02-25 MED ORDER — MECLIZINE HCL 25 MG PO TABS: 25.0000 mg | ORAL_TABLET | Freq: Three times a day (TID) | ORAL | 1 refills | Status: DC | PRN

## 2018-02-25 NOTE — Progress Notes (Signed)
Briana [name] is a 84 y.o. female with the following history as recorded in [employer]:  Patient Active Problem List   Diagnosis Date Noted  . Left lumbar radiculitis 03/07/2017  . Neck pain, bilateral 09/03/2014  . Essential hypertension, benign 06/19/2014  . RA (rheumatoid arthritis) ([facility]) 06/19/2014  . Constipation due to opioid therapy 06/19/2014  . Other abnormal glucose 04/19/2014  . Routine health maintenance 05/30/2012  . INSOMNIA UNSPECIFIED 10/31/2007  . Hyperlipidemia with target LDL less than 130 10/28/2007  . Seasonal allergic rhinitis 10/28/2007  . DJD (degenerative joint disease) of knee 10/28/2007    Current Outpatient Medications  Medication Sig Dispense Refill  . amLODipine (NORVASC) 10 MG tablet Take 1 tablet (10 mg total) by mouth daily. 90 tablet 1  . atenolol (TENORMIN) 100 MG tablet Take 1 tablet (100 mg total) by mouth daily. 90 tablet 1  . Cholecalciferol (VITAMIN D) 2000 UNITS tablet Take 2,000 Units by mouth daily.    . fexofenadine (ALLEGRA) 180 MG tablet Take 180 mg by mouth daily.    . folic acid (FOLVITE) 1 MG tablet Take 1 mg by mouth daily.    . methotrexate (RHEUMATREX) 2.5 MG tablet Take 20 mg by mouth once a week. Caution:Chemotherapy. Protect from light.  Taken on Fridays.    . simvastatin (ZOCOR) 40 MG tablet Take 1 tablet (40 mg total) by mouth every evening. 90 tablet 1  . traMADol (ULTRAM) 50 MG tablet Take 1 tablet (50 mg total) by mouth every 12 (twelve) hours as needed for moderate pain or severe pain. 180 tablet 1  . meclizine (MEDI-MECLIZINE) 25 MG tablet Take 1 tablet (25 mg total) by mouth 3 (three) times daily as needed for dizziness. 30 tablet 1  . ondansetron (ZOFRAN ODT) 4 MG disintegrating tablet Take 1 tablet (4 mg total) by mouth every 8 (eight) hours as needed for nausea or vomiting. 20 tablet 0   No current facility-administered medications for this visit.     Allergies: Ibuprofen; Percocet [oxycodone-acetaminophen]; Diltiazem  hcl; Hydrochlorothiazide; Lisinopril; and Verapamil  Past Medical History:  Diagnosis Date  . ACUTE URIS OF UNSPECIFIED SITE 11/26/2008  . ALLERGIC ARTHRITIS OTHER SPECIFIED SITES 10/22/2010   OA + RA  . ALLERGIC RHINITIS 10/28/2007  . ARTHRITIS, KNEES, BILATERAL 10/28/2007  . BACK PAIN 04/12/2009  . BUNIONECTOMY, HX OF 10/28/2007  . CELLULITIS&ABSCESS OF HAND EXCEPT FINGERS&THUMB 10/23/2010  . Complication of anesthesia    itching and makes me crazy  . Family history of anesthesia complication    DAUGHTER HAD HIVES AND " WENT WILD "  . HYPERLIPIDEMIA 10/28/2007  . HYPERTENSION 10/28/2007  . INSOMNIA UNSPECIFIED 10/31/2007  . MYOSITIS 10/22/2010  . NECK PAIN 12/03/2008  . Pain in joint, site unspecified 10/07/2010  . WRIST PAIN, LEFT 10/22/2010    Past Surgical History:  Procedure Laterality Date  . arthroscopic surgery and rotator cuff repair left shoulder    . arthroscopy right knee hx of surgery    . BUNIONECTOMY     hx of  . FOOT SURGERY     RIGHT  . hand surgery for broken finger, remote    . JOINT REPLACEMENT Right    knee  . TOTAL KNEE ARTHROPLASTY  07/01/2012   Procedure: TOTAL KNEE ARTHROPLASTY;  Surgeon: Yvette Rack., MD;  Location: [facility];  Service: Orthopedics;  Laterality: Right;  right total knee arthroplasty  . TOTAL KNEE ARTHROPLASTY Left 06/13/2014   dr Percell Miller  . TOTAL KNEE ARTHROPLASTY Left 06/13/2014  Procedure: TOTAL KNEE ARTHROPLASTY;  Surgeon: Ninetta Lights, MD;  Location: [facility];  Service: Orthopedics;  Laterality: Left;    Family History  Problem Relation Age of Onset  . Cancer Brother        lung cancer  . Coronary artery disease Other   . Heart attack Other   . Cancer Sister        Lung, stomach    Social History   Tobacco Use  . Smoking status: Never Smoker  . Smokeless tobacco: Never Used  Substance Use Topics  . Alcohol use: No    Subjective:  Patient presents with concerns for episode of vertigo; describes the sensation as the "room spinning  around her." Started suddenly Sunday afternoon; admits she was "jumping up and down a lot" celebrating her great-grandson/ family party for Mother's Day; has history of vertigo; episode occurred on Sunday; still feeling "a little dizzy" today; no chest pain, shortness of breath or blurred vision. Went to ER last year with similar symptoms- normal CT done at that time; labs done 2 months ago- normal;; she has left over Meclizine but was hesitant to use it;  Has been able to eat this week- no vomiting; no blurred vision;   Objective:  Vitals:   02/25/18 0909  BP: 140/60  Pulse: 68  Temp: 98.1 F (36.7 C)  TempSrc: Oral  SpO2: 96%  Weight: 175 lb (79.4 kg)  Height: 5\' 4"  (1.626 m)    General: Well developed, well nourished, in no acute distress  Skin : Warm and dry.  Head: Normocephalic and atraumatic  Eyes: Sclera and conjunctiva clear; pupils round and reactive to light; extraocular movements intact  Ears: External normal; canals clear; bilateral cerumen impaction Oropharynx: Pink, supple. No suspicious lesions  Neck: Supple without thyromegaly, adenopathy  Lungs: Respirations unlabored; clear to auscultation bilaterally without wheeze, rales, rhonchi  CVS exam: normal rate and regular rhythm.  Neurologic: Alert and oriented; speech intact; face symmetrical; moves all extremities well; CNII-XII intact without focal deficit   Assessment:  1. Vertigo   2. Bilateral impacted cerumen     Plan:  Most likely BPPV; Rx for Meclizine and Zofran; increase fluids, salt restriction; follow-up if symptoms persist; Discussed that ear wax can also cause dizziness- she is hesitant to attempt ear lavage today; asked her to consider using OTC Debrox and return for lavage in the next 1-2 weeks; she agrees.   No follow-ups on file.  No orders of the defined types were placed in this encounter.   Requested Prescriptions   Signed Prescriptions Disp Refills  . meclizine (MEDI-MECLIZINE) 25 MG tablet  30 tablet 1    Sig: Take 1 tablet (25 mg total) by mouth 3 (three) times daily as needed for dizziness.  . ondansetron (ZOFRAN ODT) 4 MG disintegrating tablet 20 tablet 0    Sig: Take 1 tablet (4 mg total) by mouth every 8 (eight) hours as needed for nausea or vomiting.

## 2018-02-25 NOTE — Patient Instructions (Signed)
For the ear wax, please use OTC Debrox; use this 2-3 days prior to coming back in;      Benign Positional Vertigo Vertigo is the feeling that you or your surroundings are moving when they are not. Benign positional vertigo is the most common form of vertigo. The cause of this condition is not serious (is benign). This condition is triggered by certain movements and positions (is positional). This condition can be dangerous if it occurs while you are doing something that could endanger you or others, such as driving. What are the causes? In many cases, the cause of this condition is not known. It may be caused by a disturbance in an area of the inner ear that helps your brain to sense movement and balance. This disturbance can be caused by a viral infection (labyrinthitis), head injury, or repetitive motion. What increases the risk? This condition is more likely to develop in:  Women.  People who are 21 years of age or older.  What are the signs or symptoms? Symptoms of this condition usually happen when you move your head or your eyes in different directions. Symptoms may start suddenly, and they usually last for less than a minute. Symptoms may include:  Loss of balance and falling.  Feeling like you are spinning or moving.  Feeling like your surroundings are spinning or moving.  Nausea and vomiting.  Blurred vision.  Dizziness.  Involuntary eye movement (nystagmus).  Symptoms can be mild and cause only slight annoyance, or they can be severe and interfere with daily life. Episodes of benign positional vertigo may return (recur) over time, and they may be triggered by certain movements. Symptoms may improve over time. How is this diagnosed? This condition is usually diagnosed by medical history and a physical exam of the head, neck, and ears. You may be referred to a health care provider who specializes in ear, nose, and throat (ENT) problems (otolaryngologist) or a provider who  specializes in disorders of the nervous system (neurologist). You may have additional testing, including:  MRI.  A CT scan.  Eye movement tests. Your health care provider may ask you to change positions quickly while he or she watches you for symptoms of benign positional vertigo, such as nystagmus. Eye movement may be tested with an electronystagmogram (ENG), caloric stimulation, the Dix-Hallpike test, or the roll test.  An electroencephalogram (EEG). This records electrical activity in your brain.  Hearing tests.  How is this treated? Usually, your health care provider will treat this by moving your head in specific positions to adjust your inner ear back to normal. Surgery may be needed in severe cases, but this is rare. In some cases, benign positional vertigo may resolve on its own in 2-4 weeks. Follow these instructions at home: Safety  Move slowly.Avoid sudden body or head movements.  Avoid driving.  Avoid operating heavy machinery.  Avoid doing any tasks that would be dangerous to you or others if a vertigo episode would occur.  If you have trouble walking or keeping your balance, try using a cane for stability. If you feel dizzy or unstable, sit down right away.  Return to your normal activities as told by your health care provider. Ask your health care provider what activities are safe for you. General instructions  Take over-the-counter and prescription medicines only as told by your health care provider.  Avoid certain positions or movements as told by your health care provider.  Drink enough fluid to keep your urine clear  or pale yellow.  Keep all follow-up visits as told by your health care provider. This is important. Contact a health care provider if:  You have a fever.  Your condition gets worse or you develop new symptoms.  Your family or friends notice any behavioral changes.  Your nausea or vomiting gets worse.  You have numbness or a "pins and  needles" sensation. Get help right away if:  You have difficulty speaking or moving.  You are always dizzy.  You faint.  You develop severe headaches.  You have weakness in your legs or arms.  You have changes in your hearing or vision.  You develop a stiff neck.  You develop sensitivity to light. This information is not intended to replace advice given to you by your health care provider. Make sure you discuss any questions you have with your health care provider. Document Released: 07/06/2006 Document Revised: 03/05/2016 Document Reviewed: 01/21/2015 Elsevier Interactive Patient Education  [employer].

## 2018-03-03 ENCOUNTER — Ambulatory Visit (INDEPENDENT_AMBULATORY_CARE_PROVIDER_SITE_OTHER): Payer: Medicare HMO | Admitting: Internal Medicine

## 2018-03-03 ENCOUNTER — Encounter: Payer: Self-pay | Admitting: Internal Medicine

## 2018-03-03 VITALS — BP 116/76 | HR 69 | Temp 98.9°F | Ht 64.0 in | Wt 173.0 lb

## 2018-03-03 DIAGNOSIS — R062 Wheezing: Secondary | ICD-10-CM

## 2018-03-03 DIAGNOSIS — J019 Acute sinusitis, unspecified: Secondary | ICD-10-CM | POA: Diagnosis not present

## 2018-03-03 DIAGNOSIS — R7309 Other abnormal glucose: Secondary | ICD-10-CM

## 2018-03-03 DIAGNOSIS — I10 Essential (primary) hypertension: Secondary | ICD-10-CM

## 2018-03-03 MED ORDER — AMOXICILLIN 500 MG PO CAPS: 1000.0000 mg | ORAL_CAPSULE | Freq: Two times a day (BID) | ORAL | 0 refills | Status: DC

## 2018-03-03 MED ORDER — PREDNISONE 10 MG PO TABS: ORAL_TABLET | ORAL | 0 refills | Status: DC

## 2018-03-03 MED ORDER — HYDROCODONE-HOMATROPINE 5-1.5 MG/5ML PO SYRP: 5.0000 mL | ORAL_SOLUTION | Freq: Four times a day (QID) | ORAL | 0 refills | Status: AC | PRN

## 2018-03-03 NOTE — Assessment & Plan Note (Signed)
stable overall by history and exam, recent data reviewed with pt, and pt to continue medical treatment as before,  to f/u any worsening symptoms or concerns Lab Results  Component Value Date   HGBA1C 6.2 12/02/2017

## 2018-03-03 NOTE — Patient Instructions (Addendum)
Please take all new medication as prescribed - the antibiotic, cough medicine, and prednisone as directed  Please continue all other medications as before, and refills have been done if requested.  Please have the pharmacy call with any other refills you may need.  Please keep your appointments with your specialists as you may have planned    

## 2018-03-03 NOTE — Assessment & Plan Note (Signed)
Mild to mod, for antibx course,  to f/u any worsening symptoms or concerns 

## 2018-03-03 NOTE — Assessment & Plan Note (Signed)
BP Readings from Last 3 Encounters:  03/03/18 116/76  02/25/18 140/60  12/02/17 (!) 150/80  stable overall by history and exam, recent data reviewed with pt, and pt to continue medical treatment as before,  to f/u any worsening symptoms or concerns

## 2018-03-03 NOTE — Progress Notes (Signed)
Subjective:    Patient ID: Briana [name], female    DOB: October 02, 1931, 84 y.o.   MRN: [id number]  HPI   Here with 2-3 days acute onset fever, facial pain, pressure, headache, general weakness and malaise, and greenish d/c, with mild ST and cough, but pt denies chest pain, wheezing, increased sob or doe, orthopnea, PND, increased LE swelling, palpitations, dizziness or syncope, except for mild onset wheeziness and sob since last PM.   Pt denies polydipsia, polyuria  Pt denies new neurological symptoms such as new headache, or facial or extremity weakness or numbness Past Medical History:  Diagnosis Date  . ACUTE URIS OF UNSPECIFIED SITE 11/26/2008  . ALLERGIC ARTHRITIS OTHER SPECIFIED SITES 10/22/2010   OA + RA  . ALLERGIC RHINITIS 10/28/2007  . ARTHRITIS, KNEES, BILATERAL 10/28/2007  . BACK PAIN 04/12/2009  . BUNIONECTOMY, HX OF 10/28/2007  . CELLULITIS&ABSCESS OF HAND EXCEPT FINGERS&THUMB 10/23/2010  . Complication of anesthesia    itching and makes me crazy  . Family history of anesthesia complication    DAUGHTER HAD HIVES AND " WENT WILD "  . HYPERLIPIDEMIA 10/28/2007  . HYPERTENSION 10/28/2007  . INSOMNIA UNSPECIFIED 10/31/2007  . MYOSITIS 10/22/2010  . NECK PAIN 12/03/2008  . Pain in joint, site unspecified 10/07/2010  . WRIST PAIN, LEFT 10/22/2010   Past Surgical History:  Procedure Laterality Date  . arthroscopic surgery and rotator cuff repair left shoulder    . arthroscopy right knee hx of surgery    . BUNIONECTOMY     hx of  . FOOT SURGERY     RIGHT  . hand surgery for broken finger, remote    . JOINT REPLACEMENT Right    knee  . TOTAL KNEE ARTHROPLASTY  07/01/2012   Procedure: TOTAL KNEE ARTHROPLASTY;  Surgeon: Yvette Rack., MD;  Location: [place];  Service: Orthopedics;  Laterality: Right;  right total knee arthroplasty  . TOTAL KNEE ARTHROPLASTY Left 06/13/2014   dr Percell Miller  . TOTAL KNEE ARTHROPLASTY Left 06/13/2014   Procedure: TOTAL KNEE ARTHROPLASTY;  Surgeon: Ninetta Lights, MD;  Location: [place];  Service: Orthopedics;  Laterality: Left;    reports that she has never smoked. She has never used smokeless tobacco. She reports that she does not drink alcohol or use drugs. family history includes Cancer in her brother and sister; Coronary artery disease in her other; Heart attack in her other. Allergies  Allergen Reactions  . Ibuprofen Anaphylaxis  . Percocet [Oxycodone-Acetaminophen]     nausea  . Diltiazem Hcl     REACTION: ANGIO EDEMA  . Hydrochlorothiazide     REACTION: SWELLING  . Lisinopril     REACTION: SWELLING  . Verapamil     REACTION: SWELLING   Current Outpatient Medications on File Prior to Visit  Medication Sig Dispense Refill  . amLODipine (NORVASC) 10 MG tablet Take 1 tablet (10 mg total) by mouth daily. 90 tablet 1  . atenolol (TENORMIN) 100 MG tablet Take 1 tablet (100 mg total) by mouth daily. 90 tablet 1  . Cholecalciferol (VITAMIN D) 2000 UNITS tablet Take 2,000 Units by mouth daily.    . fexofenadine (ALLEGRA) 180 MG tablet Take 180 mg by mouth daily.    . folic acid (FOLVITE) 1 MG tablet Take 1 mg by mouth daily.    . meclizine (MEDI-MECLIZINE) 25 MG tablet Take 1 tablet (25 mg total) by mouth 3 (three) times daily as needed for dizziness. 30 tablet 1  . methotrexate ([place])  2.5 MG tablet Take 20 mg by mouth once a week. Caution:Chemotherapy. Protect from light.  Taken on Fridays.    . ondansetron (ZOFRAN ODT) 4 MG disintegrating tablet Take 1 tablet (4 mg total) by mouth every 8 (eight) hours as needed for nausea or vomiting. 20 tablet 0  . simvastatin (ZOCOR) 40 MG tablet Take 1 tablet (40 mg total) by mouth every evening. 90 tablet 1  . traMADol (ULTRAM) 50 MG tablet Take 1 tablet (50 mg total) by mouth every 12 (twelve) hours as needed for moderate pain or severe pain. 180 tablet 1   No current facility-administered medications on file prior to visit.     Review of Systems  Constitutional: Negative for other unusual  diaphoresis or sweats HENT: Negative for ear discharge or swelling Eyes: Negative for other worsening visual disturbances Respiratory: Negative for stridor or other swelling  Gastrointestinal: Negative for worsening distension or other blood Genitourinary: Negative for retention or other urinary change Musculoskeletal: Negative for other MSK pain or swelling Skin: Negative for color change or other new lesions Neurological: Negative for worsening tremors and other numbness  Psychiatric/Behavioral: Negative for worsening agitation or other fatigue All other system neg per pt    Objective:   Physical Exam BP 116/76   Pulse 69   Temp 98.9 F (37.2 C) (Oral)   Ht 5\' 4"  (1.626 m)   Wt 173 lb (78.5 kg)   SpO2 95%   BMI 29.70 kg/m  VS noted, mild ill, warm to touch Constitutional: Pt appears in NAD HENT: Head: NCAT.  Right Ear: External ear normal.  Left Ear: External ear normal.  Bilat tm's with mild erythema.  Max sinus areas mild tender.  Pharynx with mild erythema, no exudate  Eyes: . Pupils are equal, round, and reactive to light. Conjunctivae and EOM are normal Nose: without d/c or deformity Neck: Neck supple. Gross normal ROM Cardiovascular: Normal rate and regular rhythm.   Pulmonary/Chest: Effort normal and breath sounds decresaed without rales but with mild scattered few wheezing.  Neurological: Pt is alert. At baseline orientation, motor grossly intact Skin: Skin is warm. No rashes, other new lesions, no LE edema Psychiatric: Pt behavior is normal without agitation  No other exam findings     Assessment & Plan:

## 2018-03-03 NOTE — Assessment & Plan Note (Signed)
Very mild, for short course predpac asd, declines inhaler use, also for cough med prn

## 2018-04-21 DIAGNOSIS — M0609 Rheumatoid arthritis without rheumatoid factor, multiple sites: Secondary | ICD-10-CM | POA: Diagnosis not present

## 2018-04-21 DIAGNOSIS — Z79899 Other long term (current) drug therapy: Secondary | ICD-10-CM | POA: Diagnosis not present

## 2018-04-21 DIAGNOSIS — Z6830 Body mass index (BMI) 30.0-30.9, adult: Secondary | ICD-10-CM | POA: Diagnosis not present

## 2018-04-21 DIAGNOSIS — E669 Obesity, unspecified: Secondary | ICD-10-CM | POA: Diagnosis not present

## 2018-04-21 DIAGNOSIS — M150 Primary generalized (osteo)arthritis: Secondary | ICD-10-CM | POA: Diagnosis not present

## 2018-04-21 DIAGNOSIS — M2550 Pain in unspecified joint: Secondary | ICD-10-CM | POA: Diagnosis not present

## 2018-05-04 ENCOUNTER — Other Ambulatory Visit: Payer: Self-pay | Admitting: Internal Medicine

## 2018-05-04 DIAGNOSIS — I10 Essential (primary) hypertension: Secondary | ICD-10-CM

## 2018-05-04 DIAGNOSIS — E785 Hyperlipidemia, unspecified: Secondary | ICD-10-CM

## 2018-05-06 DIAGNOSIS — Z1231 Encounter for screening mammogram for malignant neoplasm of breast: Secondary | ICD-10-CM | POA: Diagnosis not present

## 2018-05-06 LAB — HM MAMMOGRAPHY

## 2018-05-12 ENCOUNTER — Encounter: Payer: Self-pay | Admitting: Internal Medicine

## 2018-06-01 NOTE — Progress Notes (Deleted)
Subjective:   Briana [name] is a 84 y.o. female who presents for Medicare Annual (Subsequent) preventive examination.  Review of Systems:  No ROS.  Medicare Wellness Visit. Additional risk factors are reflected in the social history.    Sleep patterns: {SX; SLEEP PATTERNS:18802::"feels rested on waking","does not get up to void","gets up *** times nightly to void","sleeps *** hours nightly"}.    Home Safety/Smoke Alarms: Feels safe in home. Smoke alarms in place.  Living environment; residence and Firearm Safety: {Rehab home environment / accessibility:30080::"no firearms","firearms stored safely"}. Seat Belt Safety/Bike Helmet: Wears seat belt.      Objective:     Vitals: There were no vitals taken for this visit.  There is no height or weight on file to calculate BMI.  Advanced Directives 02/27/2017 11/10/2016 10/24/2015 10/23/2015 06/13/2014 06/05/2014 07/01/2012  Does Patient Have a Medical Advance Directive? No Yes Yes Yes No Yes Patient does not have advance directive  Type of Advance Directive - [place];Living will [place];Living will Living will;Healthcare Power of [place] -  Does patient want to make changes to medical advance directive? - - No - Patient declined No - Patient declined - No - Patient declined -  Copy of [place] in Chart? - No - copy requested Yes Yes - No - copy requested -  Would patient like information on creating a medical advance directive? - - - - Yes - Educational materials given - -  Pre-existing out of facility DNR order (yellow form or pink MOST form) - - - - - - No    Tobacco Social History   Tobacco Use  Smoking Status Never Smoker  Smokeless Tobacco Never Used     Counseling given: Not Answered  Past Medical History:  Diagnosis Date  . ACUTE URIS OF UNSPECIFIED SITE 11/26/2008  . ALLERGIC ARTHRITIS OTHER SPECIFIED SITES 10/22/2010   OA + RA  .  ALLERGIC RHINITIS 10/28/2007  . ARTHRITIS, KNEES, BILATERAL 10/28/2007  . BACK PAIN 04/12/2009  . BUNIONECTOMY, HX OF 10/28/2007  . CELLULITIS&ABSCESS OF HAND EXCEPT FINGERS&THUMB 10/23/2010  . Complication of anesthesia    itching and makes me crazy  . Family history of anesthesia complication    DAUGHTER HAD HIVES AND " WENT WILD "  . HYPERLIPIDEMIA 10/28/2007  . HYPERTENSION 10/28/2007  . INSOMNIA UNSPECIFIED 10/31/2007  . MYOSITIS 10/22/2010  . NECK PAIN 12/03/2008  . Pain in joint, site unspecified 10/07/2010  . WRIST PAIN, LEFT 10/22/2010   Past Surgical History:  Procedure Laterality Date  . arthroscopic surgery and rotator cuff repair left shoulder    . arthroscopy right knee hx of surgery    . BUNIONECTOMY     hx of  . FOOT SURGERY     RIGHT  . hand surgery for broken finger, remote    . JOINT REPLACEMENT Right    knee  . TOTAL KNEE ARTHROPLASTY  07/01/2012   Procedure: TOTAL KNEE ARTHROPLASTY;  Surgeon: Yvette Rack., MD;  Location: [place];  Service: Orthopedics;  Laterality: Right;  right total knee arthroplasty  . TOTAL KNEE ARTHROPLASTY Left 06/13/2014   dr Percell Miller  . TOTAL KNEE ARTHROPLASTY Left 06/13/2014   Procedure: TOTAL KNEE ARTHROPLASTY;  Surgeon: Ninetta Lights, MD;  Location: [place] [facility] [place];  Service: Orthopedics;  Laterality: Left;   Family History  Problem Relation Age of Onset  . Cancer Brother        lung cancer  .  Coronary artery disease Other   . Heart attack Other   . Cancer Sister        Lung, stomach   Social History   Socioeconomic History  . Marital status: Widowed    Spouse name: Not on file  . Number of children: 4  . Years of education: 68  . Highest education level: Not on file  Occupational History  . Occupation: ASSN II 1    Employer: [employer]  Social Needs  . Financial resource strain: Not on file  . Food insecurity:    Worry: Not on file    Inability: Not on file  . Transportation needs:    Medical: Not on file    Non-medical:  Not on file  Tobacco Use  . Smoking status: Never Smoker  . Smokeless tobacco: Never Used  Substance and Sexual Activity  . Alcohol use: No  . Drug use: No  . Sexual activity: Not Currently  Lifestyle  . Physical activity:    Days per week: Not on file    Minutes per session: Not on file  . Stress: Not on file  Relationships  . Social connections:    Talks on phone: Not on file    Gets together: Not on file    Attends religious service: Not on file    Active member of club or organization: Not on file    Attends meetings of clubs or organizations: Not on file    Relationship status: Not on file  Other Topics Concern  . 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    Outpatient Encounter Medications as of 06/02/2018  Medication Sig  . amLODipine (NORVASC) 10 MG tablet Take 1 tablet (10 mg total) by mouth daily.  Marland Kitchen amoxicillin (AMOXIL) 500 MG capsule Take 2 capsules (1,000 mg total) by mouth 2 (two) times daily.  Marland Kitchen atenolol (TENORMIN) 100 MG tablet TAKE 1 TABLET (100 MG TOTAL) BY MOUTH DAILY.  Marland Kitchen Cholecalciferol (VITAMIN D) 2000 UNITS tablet Take 2,000 Units by mouth daily.  . fexofenadine (ALLEGRA) 180 MG tablet Take 180 mg by mouth daily.  . folic acid (FOLVITE) 1 MG tablet Take 1 mg by mouth daily.  . meclizine (MEDI-MECLIZINE) 25 MG tablet Take 1 tablet (25 mg total) by mouth 3 (three) times daily as needed for dizziness.  . methotrexate (RHEUMATREX) 2.5 MG tablet Take 20 mg by mouth once a week. Caution:Chemotherapy. Protect from light.  Taken on Fridays.  . ondansetron (ZOFRAN ODT) 4 MG disintegrating tablet Take 1 tablet (4 mg total) by mouth every 8 (eight) hours as needed for nausea or vomiting.  . predniSONE (DELTASONE) 10 MG tablet 2 tabs by mouth per day for 5 days  . simvastatin (ZOCOR)  40 MG tablet TAKE 1 TABLET (40 MG TOTAL) BY MOUTH EVERY EVENING.  . traMADol (ULTRAM) 50 MG tablet Take 1 tablet (50 mg total) by mouth every 12 (twelve) hours as needed for moderate pain or severe pain.   No facility-administered encounter medications on file as of 06/02/2018.     Activities of Daily Living No flowsheet data found.  Patient Care Team: Janith Lima, MD as PCP - General (Internal Medicine)    Assessment:   This is a routine wellness examination  for [place]. Physical assessment deferred to PCP.   Exercise Activities and Dietary recommendations   Diet (meal preparation, eat out, water intake, caffeinated beverages, dairy products, fruits and vegetables): {Desc; diets:16563}  Goals   None     Fall Risk Fall Risk  12/02/2017 11/10/2016 10/24/2015 10/23/2015 10/23/2015  Falls in the past year? No No No No No    Depression Screen PHQ 2/9 Scores 12/02/2017 11/10/2016 10/24/2015 10/23/2015  PHQ - 2 Score 0 0 0 0     Cognitive Function        Immunization History  Administered Date(s) Administered  . Influenza Split 07/03/2012  . Influenza, High Dose Seasonal PF 07/24/2013  . Influenza,inj,Quad PF,6+ Mos 08/06/2014  . Influenza-Unspecified 07/16/2015, 07/28/2016, 07/12/2017  . Pneumococcal Conjugate-13 10/23/2015  . Pneumococcal Polysaccharide-23 04/19/2014  . Td 05/30/2012  . Zoster 07/13/2015   Screening Tests Health Maintenance  Topic Date Due  . INFLUENZA VACCINE  05/12/2018  . TETANUS/TDAP  05/30/2022  . DEXA SCAN  Completed  . PNA vac Low Risk Adult  Completed      Plan:     I have personally reviewed and noted the following in the patient's chart:   . Medical and social history . Use of alcohol, tobacco or illicit drugs  . Current medications and supplements . Functional ability and status . Nutritional status . Physical activity . Advanced directives . List of other physicians . Vitals . Screenings to include cognitive, depression, and  falls . Referrals and appointments  In addition, I have reviewed and discussed with patient certain preventive protocols, quality metrics, and best practice recommendations. A written personalized care plan for preventive services as well as general preventive health recommendations were provided to patient.     Michiel Cowboy, RN  06/01/2018

## 2018-06-02 ENCOUNTER — Ambulatory Visit: Payer: Medicare HMO | Admitting: Internal Medicine

## 2018-06-02 ENCOUNTER — Ambulatory Visit (INDEPENDENT_AMBULATORY_CARE_PROVIDER_SITE_OTHER): Payer: Medicare HMO | Admitting: Internal Medicine

## 2018-06-02 ENCOUNTER — Encounter: Payer: Self-pay | Admitting: Internal Medicine

## 2018-06-02 ENCOUNTER — Ambulatory Visit (INDEPENDENT_AMBULATORY_CARE_PROVIDER_SITE_OTHER): Payer: Medicare HMO | Admitting: *Deleted

## 2018-06-02 VITALS — BP 141/60 | HR 61 | Temp 98.0°F | Resp 19 | Ht 64.0 in | Wt 176.0 lb

## 2018-06-02 VITALS — BP 141/60 | HR 61 | Temp 98.0°F | Ht 64.0 in | Wt 176.0 lb

## 2018-06-02 DIAGNOSIS — I10 Essential (primary) hypertension: Secondary | ICD-10-CM | POA: Diagnosis not present

## 2018-06-02 DIAGNOSIS — M05712 Rheumatoid arthritis with rheumatoid factor of left shoulder without organ or systems involvement: Secondary | ICD-10-CM

## 2018-06-02 DIAGNOSIS — F5101 Primary insomnia: Secondary | ICD-10-CM | POA: Diagnosis not present

## 2018-06-02 DIAGNOSIS — M05711 Rheumatoid arthritis with rheumatoid factor of right shoulder without organ or systems involvement: Secondary | ICD-10-CM | POA: Diagnosis not present

## 2018-06-02 DIAGNOSIS — Z0000 Encounter for general adult medical examination without abnormal findings: Secondary | ICD-10-CM | POA: Diagnosis not present

## 2018-06-02 DIAGNOSIS — E785 Hyperlipidemia, unspecified: Secondary | ICD-10-CM

## 2018-06-02 DIAGNOSIS — M170 Bilateral primary osteoarthritis of knee: Secondary | ICD-10-CM | POA: Diagnosis not present

## 2018-06-02 MED ORDER — TRAMADOL HCL 50 MG PO TABS: 50.0000 mg | ORAL_TABLET | Freq: Two times a day (BID) | ORAL | 1 refills | Status: DC | PRN

## 2018-06-02 MED ORDER — ESZOPICLONE 1 MG PO TABS: 1.0000 mg | ORAL_TABLET | Freq: Every evening | ORAL | 1 refills | Status: DC | PRN

## 2018-06-02 NOTE — Patient Instructions (Signed)

## 2018-06-02 NOTE — Progress Notes (Addendum)
Subjective:   Briana [name] is a 84 y.o. female who presents for Medicare Annual (Subsequent) preventive examination.  Review of Systems:  No ROS.  Medicare Wellness Visit. Additional risk factors are reflected in the social history.  Cardiac Risk Factors include: advanced age (>79men, >82 women);dyslipidemia;hypertension Sleep patterns: has frequent nighttime awakenings, gets up 1-2 times nightly to void and sleeps 5-6 hours nightly. Patient reports insomnia issues, discussed recommended sleep tips.   Home Safety/Smoke Alarms: Feels safe in home. Smoke alarms in place.  Living environment; residence and Firearm Safety: 1-story house/ trailer, equipment: Radio producer, Type: Single Electrical engineer, Type: Tub Surveyor, quantity, no firearms. Lives with son, no needs for DME, good support system Seat Belt Safety/Bike Helmet: Wears seat belt.      Objective:     Vitals: BP (!) 141/60   Pulse 61   Temp 98 F (36.7 C)   Resp 19   Ht 5\' 4"  (1.626 m)   Wt 176 lb (79.8 kg)   SpO2 100%   BMI 30.21 kg/m   Body mass index is 30.21 kg/m.  Advanced Directives 06/02/2018 02/27/2017 11/10/2016 10/24/2015 10/23/2015 06/13/2014 06/05/2014  Does Patient Have a Medical Advance Directive? No No Yes Yes Yes No Yes  Type of Advance Directive - Public librarian;Living will [place];Living will Living will;Healthcare Power of [place]  Does patient want to make changes to medical advance directive? - - - No - Patient declined No - Patient declined - No - Patient declined  Copy of [place] in Chart? - - No - copy requested Yes Yes - No - copy requested  Would patient like information on creating a medical advance directive? Yes (ED - Information included in AVS) - - - - Yes - Educational materials given -  Pre-existing out of facility DNR order (yellow form or pink MOST form) - - - - - - -    Tobacco Social History    Tobacco Use  Smoking Status Never Smoker  Smokeless Tobacco Never Used     Counseling given: Not Answered  Past Medical History:  Diagnosis Date  . ACUTE URIS OF UNSPECIFIED SITE 11/26/2008  . ALLERGIC ARTHRITIS OTHER SPECIFIED SITES 10/22/2010   OA + RA  . ALLERGIC RHINITIS 10/28/2007  . ARTHRITIS, KNEES, BILATERAL 10/28/2007  . BACK PAIN 04/12/2009  . BUNIONECTOMY, HX OF 10/28/2007  . CELLULITIS&ABSCESS OF HAND EXCEPT FINGERS&THUMB 10/23/2010  . Complication of anesthesia    itching and makes me crazy  . Family history of anesthesia complication    DAUGHTER HAD HIVES AND " WENT WILD "  . HYPERLIPIDEMIA 10/28/2007  . HYPERTENSION 10/28/2007  . INSOMNIA UNSPECIFIED 10/31/2007  . MYOSITIS 10/22/2010  . NECK PAIN 12/03/2008  . Pain in joint, site unspecified 10/07/2010  . WRIST PAIN, LEFT 10/22/2010   Past Surgical History:  Procedure Laterality Date  . arthroscopic surgery and rotator cuff repair left shoulder    . arthroscopy right knee hx of surgery    . BUNIONECTOMY     hx of  . FOOT SURGERY     RIGHT  . hand surgery for broken finger, remote    . JOINT REPLACEMENT Right    knee  . TOTAL KNEE ARTHROPLASTY  07/01/2012   Procedure: TOTAL KNEE ARTHROPLASTY;  Surgeon: Yvette Rack., MD;  Location: [facility];  Service: Orthopedics;  Laterality: Right;  right total knee arthroplasty  . TOTAL KNEE  ARTHROPLASTY Left 06/13/2014   dr Percell Miller  . TOTAL KNEE ARTHROPLASTY Left 06/13/2014   Procedure: TOTAL KNEE ARTHROPLASTY;  Surgeon: Ninetta Lights, MD;  Location: [facility];  Service: Orthopedics;  Laterality: Left;   Family History  Problem Relation Age of Onset  . Cancer Brother        lung cancer  . Coronary artery disease Other   . Heart attack Other   . Cancer Sister        Lung, stomach   Social History   Socioeconomic History  . Marital status: Widowed    Spouse name: Not on file  . Number of children: 4  . Years of education: 26  . Highest education level: Not on file   Occupational History  . Occupation: ASSN II 1    Employer: [employer]  Social Needs  . Financial resource strain: Not hard at all  . Food insecurity:    Worry: Never true    Inability: Never true  . Transportation needs:    Medical: No    Non-medical: No  Tobacco Use  . Smoking status: Never Smoker  . Smokeless tobacco: Never Used  Substance and Sexual Activity  . Alcohol use: No  . Drug use: No  . Sexual activity: Not Currently  Lifestyle  . Physical activity:    Days per week: 2 days    Minutes per session: 50 min  . Stress: Rather much  Relationships  . Social connections:    Talks on phone: More than three times a week    Gets together: Never    Attends religious service: More than 4 times per year    Active member of club or organization: Yes    Attends meetings of clubs or organizations: More than 4 times per year    Relationship status: Widowed  Other Topics Concern  . 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    Outpatient Encounter Medications as of 06/02/2018  Medication Sig  . amLODipine (NORVASC) 10 MG tablet Take 1 tablet (10 mg total) by mouth daily.  Marland Kitchen atenolol (TENORMIN) 100 MG tablet TAKE 1 TABLET (100 MG TOTAL) BY MOUTH DAILY.  Marland Kitchen Cholecalciferol (VITAMIN D) 2000 UNITS tablet Take 2,000 Units by mouth daily.  . fexofenadine (ALLEGRA) 180 MG tablet Take 180 mg by mouth daily.  . folic acid (FOLVITE) 1 MG tablet Take 1 mg by mouth daily.  . meclizine (MEDI-MECLIZINE) 25 MG tablet Take 1 tablet (25 mg total) by mouth 3 (three) times daily as needed for dizziness.  . methotrexate (RHEUMATREX) 2.5 MG tablet Take 20 mg by mouth once a week. Caution:Chemotherapy. Protect from light.  Taken on Fridays.  . ondansetron (ZOFRAN ODT) 4 MG disintegrating tablet Take 1  tablet (4 mg total) by mouth every 8 (eight) hours as needed for nausea or vomiting.  . simvastatin (ZOCOR) 40 MG tablet TAKE 1 TABLET (40 MG TOTAL) BY MOUTH EVERY EVENING.  . traMADol (ULTRAM) 50 MG tablet Take 1 tablet (50 mg total) by mouth every 12 (twelve) hours as needed for moderate pain or severe pain.  . [DISCONTINUED] amoxicillin (AMOXIL) 500 MG capsule Take 2 capsules (1,000 mg total) by mouth 2 (two) times daily. (Patient not taking: Reported on 06/02/2018)  . [  DISCONTINUED] predniSONE (DELTASONE) 10 MG tablet 2 tabs by mouth per day for 5 days (Patient not taking: Reported on 06/02/2018)   No facility-administered encounter medications on file as of 06/02/2018.     Activities of Daily Living In your present state of health, do you have any difficulty performing the following activities: 06/02/2018  Hearing? N  Vision? N  Difficulty concentrating or making decisions? N  Walking or climbing stairs? N  Dressing or bathing? N  Doing errands, shopping? N  Preparing Food and eating ? N  Using the Toilet? N  In the past six months, have you accidently leaked urine? N  Do you have problems with loss of bowel control? N  Managing your Medications? N  Managing your Finances? N  Housekeeping or managing your Housekeeping? N  Some recent data might be hidden    Patient Care Team: Janith Lima, MD as PCP - General (Internal Medicine)    Assessment:   This is a routine wellness examination for Briana [name]. Physical assessment deferred to PCP.   Exercise Activities and Dietary recommendations Current Exercise Habits: Structured exercise class, Time (Minutes): 45, Frequency (Times/Week): 2, Weekly Exercise (Minutes/Week): 90, Intensity: Mild, Exercise limited by: orthopedic condition(s)  Diet (meal preparation, eat out, water intake, caffeinated beverages, dairy products, fruits and vegetables): in general, a "healthy" diet  , well balanced   Reviewed heart healthy and diabetic diet due  to HGB A1c 6.2. Encouraged patient to increase daily water and healthy fluid intake. Discussed supplementing with ensure to increase energy level, samples and coupons provided.     Goals    . Patient Stated     Stay active physically and socially active. Enjoy life, church and love family.       Fall Risk Fall Risk  06/02/2018 12/02/2017 11/10/2016 10/24/2015 10/23/2015  Falls in the past year? No No No No No  Risk for fall due to : Impaired balance/gait;Impaired mobility;History of fall(s) - - - -  Risk for fall due to: Comment discussed fall precautions - - - -   Depression Screen PHQ 2/9 Scores 06/02/2018 12/02/2017 11/10/2016 10/24/2015  PHQ - 2 Score 1 0 0 0  PHQ- 9 Score 6 - - -     Cognitive Function MMSE - Mini Mental State Exam 06/02/2018  Orientation to time 5  Orientation to Place 5  Registration 3  Attention/ Calculation 3  Recall 3  Language- name 2 objects 2  Language- repeat 1  Language- follow 3 step command 3  Language- read & follow direction 1  Write a sentence 1  Copy design 1  Total score 28        Immunization History  Administered Date(s) Administered  . Influenza Split 07/03/2012  . Influenza, High Dose Seasonal PF 07/24/2013  . Influenza,inj,Quad PF,6+ Mos 08/06/2014  . Influenza-Unspecified 07/16/2015, 07/28/2016, 07/12/2017  . Pneumococcal Conjugate-13 10/23/2015  . Pneumococcal Polysaccharide-23 04/19/2014  . Td 05/30/2012  . Zoster 07/13/2015    Screening Tests Health Maintenance  Topic Date Due  . INFLUENZA VACCINE  01/20/2019 (Originally 05/12/2018)  . TETANUS/TDAP  05/30/2022  . DEXA SCAN  Completed  . PNA vac Low Risk Adult  Completed      Plan:     Continue doing brain stimulating activities (puzzles, reading, adult coloring books, staying active) to keep memory sharp.   Continue to eat heart healthy diet (full of fruits, vegetables, whole grains, lean protein, water--limit salt, fat, and sugar intake) and increase physical  activity as  tolerated.  I have personally reviewed and noted the following in the patient's chart:   . Medical and social history . Use of alcohol, tobacco or illicit drugs  . Current medications and supplements . Functional ability and status . Nutritional status . Physical activity . Advanced directives . List of other physicians . Vitals . Screenings to include cognitive, depression, and falls . Referrals and appointments  In addition, I have reviewed and discussed with patient certain preventive protocols, quality metrics, and best practice recommendations. A written personalized care plan for preventive services as well as general preventive health recommendations were provided to patient.     Michiel Cowboy, RN  06/02/2018  Medical screening examination/treatment/procedure(s) were performed by non-physician practitioner and as supervising physician I was immediately available for consultation/collaboration. I agree with above. Scarlette Calico, MD

## 2018-06-02 NOTE — Progress Notes (Signed)
Subjective:  Patient ID: Briana [name], female    DOB: 1931-02-03  Age: 84 y.o. MRN: [id number]  CC: Hypertension and Osteoarthritis   HPI Waneta Fitting Latka presents for f/up - She complains of insomnia with difficulty falling asleep, joint pain, and muscle aches.  Outpatient Medications Prior to Visit  Medication Sig Dispense Refill  . amLODipine (NORVASC) 10 MG tablet Take 1 tablet (10 mg total) by mouth daily. 90 tablet 1  . atenolol (TENORMIN) 100 MG tablet TAKE 1 TABLET (100 MG TOTAL) BY MOUTH DAILY. 90 tablet 1  . Cholecalciferol (VITAMIN D) 2000 UNITS tablet Take 2,000 Units by mouth daily.    . fexofenadine (ALLEGRA) 180 MG tablet Take 180 mg by mouth daily.    . folic acid (FOLVITE) 1 MG tablet Take 1 mg by mouth daily.    . methotrexate (RHEUMATREX) 2.5 MG tablet Take 20 mg by mouth once a week. Caution:Chemotherapy. Protect from light.  Taken on Fridays.    . meclizine (MEDI-MECLIZINE) 25 MG tablet Take 1 tablet (25 mg total) by mouth 3 (three) times daily as needed for dizziness. 30 tablet 1  . ondansetron (ZOFRAN ODT) 4 MG disintegrating tablet Take 1 tablet (4 mg total) by mouth every 8 (eight) hours as needed for nausea or vomiting. 20 tablet 0  . simvastatin (ZOCOR) 40 MG tablet TAKE 1 TABLET (40 MG TOTAL) BY MOUTH EVERY EVENING. 90 tablet 1  . traMADol (ULTRAM) 50 MG tablet Take 1 tablet (50 mg total) by mouth every 12 (twelve) hours as needed for moderate pain or severe pain. 180 tablet 1   No facility-administered medications prior to visit.     ROS Review of Systems  Constitutional: Negative.  Negative for appetite change, diaphoresis, fatigue and unexpected weight change.  HENT: Negative.  Negative for trouble swallowing.   Eyes: Negative for visual disturbance.  Respiratory: Negative.  Negative for cough, chest tightness, shortness of breath and wheezing.   Cardiovascular: Negative for chest pain, palpitations and leg swelling.  Gastrointestinal: Negative for  abdominal pain, constipation, diarrhea, nausea and vomiting.  Genitourinary: Negative.  Negative for difficulty urinating.  Musculoskeletal: Positive for arthralgias and myalgias. Negative for back pain and neck stiffness.  Skin: Negative.  Negative for color change, pallor and rash.  Neurological: Negative.  Negative for dizziness, weakness and light-headedness.  Hematological: Negative for adenopathy. Does not bruise/bleed easily.  Psychiatric/Behavioral: Negative for behavioral problems. The patient is hyperactive.     Objective:  BP (!) 141/60 (BP Location: Left Arm, Patient Position: Sitting, Cuff Size: Normal)   Pulse 61   Temp 98 F (36.7 C) (Oral)   Ht 5\' 4"  (1.626 m)   Wt 176 lb (79.8 kg)   SpO2 100%   BMI 30.21 kg/m   BP Readings from Last 3 Encounters:  06/02/18 (!) 141/60  06/02/18 (!) 141/60  03/03/18 116/76    Wt Readings from Last 3 Encounters:  06/02/18 176 lb (79.8 kg)  06/02/18 176 lb (79.8 kg)  03/03/18 173 lb (78.5 kg)    Physical Exam  Constitutional: She is oriented to person, place, and time. No distress.  HENT:  Mouth/Throat: Oropharynx is clear and moist. No oropharyngeal exudate.  Eyes: Conjunctivae are normal. No scleral icterus.  Neck: Normal range of motion. Neck supple. No JVD present. No thyromegaly present.  Cardiovascular: Normal rate and regular rhythm. Exam reveals no gallop.  No murmur heard. Pulmonary/Chest: Effort normal and breath sounds normal. No respiratory distress. She has no wheezes. She  has no rales.  Abdominal: Soft. Bowel sounds are normal. She exhibits no mass. There is no hepatosplenomegaly. There is no tenderness.  Musculoskeletal: Normal range of motion. She exhibits no edema or tenderness.       Right knee: She exhibits deformity (DJD).       Left knee: She exhibits deformity (DJD).  Lymphadenopathy:    She has no cervical adenopathy.  Neurological: She is alert and oriented to person, place, and time.  Skin: Skin is  warm and dry. She is not diaphoretic. No pallor.    Lab Results  Component Value Date   WBC 8.2 12/02/2017   HGB 13.5 12/02/2017   HCT 40.0 12/02/2017   PLT 193.0 12/02/2017   GLUCOSE 128 (H) 12/02/2017   CHOL 193 12/02/2017   TRIG 76.0 12/02/2017   HDL 100.90 12/02/2017   LDLDIRECT 104.5 04/25/2010   LDLCALC 77 12/02/2017   ALT 19 12/02/2017   AST 20 12/02/2017   NA 140 12/02/2017   K 4.3 12/02/2017   CL 103 12/02/2017   CREATININE 0.97 12/02/2017   BUN 20 12/02/2017   CO2 31 12/02/2017   TSH 1.38 12/02/2017   INR 0.92 06/05/2014   HGBA1C 6.2 12/02/2017    Dg Chest 2 View  Result Date: 02/27/2017 CLINICAL DATA:  Pt reports she felt dizziness (room spinning) 45 minutes ago and then began to have emesis. Pt reports she is no longer dizzy. But does have dry heaves. No CP or SOB.; HTN; non smoker EXAM: CHEST  2 VIEW COMPARISON:  03/10/2016 FINDINGS: The heart size and mediastinal contours are within normal limits. Both lungs are clear. No pleural effusion or pneumothorax. The visualized skeletal structures are unremarkable. IMPRESSION: No active cardiopulmonary disease. Electronically Signed   By: Lajean Manes M.D.   On: 02/27/2017 14:11   Ct Head Wo Contrast  Result Date: 02/27/2017 CLINICAL DATA:  Dizziness beginning today with vomiting. EXAM: CT HEAD WITHOUT CONTRAST TECHNIQUE: Contiguous axial images were obtained from the base of the skull through the vertex without intravenous contrast. COMPARISON:  None. FINDINGS: Brain: Ventricles, cisterns and other CSF spaces are within normal. Minimal age related atrophic change. Mild chronic ischemic microvascular disease. No evidence of mass, mass effect, shift of midline structures or acute hemorrhage. No evidence to suggest acute infarction. Vascular: No hyperdense vessel or unexpected calcification. Skull: Within normal. Sinuses/Orbits: Within normal. Other: None. IMPRESSION: No acute intracranial findings. Mild chronic ischemic  microvascular disease. Subtle age related atrophic change. Electronically Signed   By: Marin Olp M.D.   On: 02/27/2017 14:05    Assessment & Plan:   Champagne was seen today for hypertension and osteoarthritis.  Diagnoses and all orders for this visit:  Primary osteoarthritis of both knees -     traMADol (ULTRAM) 50 MG tablet; Take 1 tablet (50 mg total) by mouth every 12 (twelve) hours as needed for moderate pain or severe pain.  Essential hypertension, benign- Her blood pressure is well controlled.  Primary insomnia -     eszopiclone (LUNESTA) 1 MG TABS tablet; Take 1 tablet (1 mg total) by mouth at bedtime as needed for sleep. Take immediately before bedtime  Rheumatoid arthritis involving both shoulders with positive rheumatoid factor (HCC) -     traMADol (ULTRAM) 50 MG tablet; Take 1 tablet (50 mg total) by mouth every 12 (twelve) hours as needed for moderate pain or severe pain.  Hyperlipidemia with target LDL less than 130- She complains of muscle and joint aches so I  have asked her to stop taking the statin to see if her symptoms improve.   I have discontinued Taji M. Schriefer's meclizine, ondansetron, and simvastatin. I am also having her start on eszopiclone. Additionally, I am having her maintain her folic acid, methotrexate, Vitamin D, fexofenadine, amLODipine, atenolol, and traMADol.  Meds ordered this encounter  Medications  . eszopiclone (LUNESTA) 1 MG TABS tablet    Sig: Take 1 tablet (1 mg total) by mouth at bedtime as needed for sleep. Take immediately before bedtime    Dispense:  90 tablet    Refill:  1  . traMADol (ULTRAM) 50 MG tablet    Sig: Take 1 tablet (50 mg total) by mouth every 12 (twelve) hours as needed for moderate pain or severe pain.    Dispense:  180 tablet    Refill:  1     Follow-up: Return in about 6 months (around 12/03/2018).  Scarlette Calico, MD

## 2018-06-02 NOTE — Patient Instructions (Addendum)
Continue doing brain stimulating activities (puzzles, reading, adult coloring books, staying active) to keep memory sharp.   Continue to eat heart healthy diet (full of fruits, vegetables, whole grains, lean protein, water--limit salt, fat, and sugar intake) and increase physical activity as tolerated.   Briana [name] , Thank you for taking time to come for your Medicare Wellness Visit. I appreciate your ongoing commitment to your health goals. Please review the following plan we discussed and let me know if I can assist you in the future.   These are the goals we discussed: Goals    . Patient Stated     Stay active physically and socially active. Enjoy life, church and love family.       This is a list of the screening recommended for you and due dates:  Health Maintenance  Topic Date Due  . Flu Shot  05/12/2018  . Tetanus Vaccine  05/30/2022  . DEXA scan (bone density measurement)  Completed  . Pneumonia vaccines  Completed   Health Maintenance, Female Adopting a healthy lifestyle and getting preventive care can go a long way to promote health and wellness. Talk with your health care provider about what schedule of regular examinations is right for you. This is a good chance for you to check in with your provider about disease prevention and staying healthy. In between checkups, there are plenty of things you can do on your own. Experts have done a lot of research about which lifestyle changes and preventive measures are most likely to keep you healthy. Ask your health care provider for more information. Weight and diet Eat a healthy diet  Be sure to include plenty of vegetables, fruits, low-fat dairy products, and lean protein.  Do not eat a lot of foods high in solid fats, added sugars, or salt.  Get regular exercise. This is one of the most important things you can do for your health. ? Most adults should exercise for at least 150 minutes each week. The exercise should increase  your heart rate and make you sweat (moderate-intensity exercise). ? Most adults should also do strengthening exercises at least twice a week. This is in addition to the moderate-intensity exercise.  Maintain a healthy weight  Body mass index (BMI) is a measurement that can be used to identify possible weight problems. It estimates body fat based on height and weight. Your health care provider can help determine your BMI and help you achieve or maintain a healthy weight.  For females 78 years of age and older: ? A BMI below 18.5 is considered underweight. ? A BMI of 18.5 to 24.9 is normal. ? A BMI of 25 to 29.9 is considered overweight. ? A BMI of 30 and above is considered obese.  Watch levels of cholesterol and blood lipids  You should start having your blood tested for lipids and cholesterol at 82 years of age, then have this test every 5 years.  You may need to have your cholesterol levels checked more often if: ? Your lipid or cholesterol levels are high. ? You are older than 82 years of age. ? You are at high risk for heart disease.  Cancer screening Lung Cancer  Lung cancer screening is recommended for adults 52-13 years old who are at high risk for lung cancer because of a history of smoking.  A yearly low-dose CT scan of the lungs is recommended for people who: ? Currently smoke. ? Have quit within the past 15 years. ? Have  at least a 30-pack-year history of smoking. A pack year is smoking an average of one pack of cigarettes a day for 1 year.  Yearly screening should continue until it has been 15 years since you quit.  Yearly screening should stop if you develop a health problem that would prevent you from having lung cancer treatment.  Breast Cancer  Practice breast self-awareness. This means understanding how your breasts normally appear and feel.  It also means doing regular breast self-exams. Let your health care provider know about any changes, no matter how  small.  If you are in your 20s or 30s, you should have a clinical breast exam (CBE) by a health care provider every 1-3 years as part of a regular health exam.  If you are 34 or older, have a CBE every year. Also consider having a breast X-ray (mammogram) every year.  If you have a family history of breast cancer, talk to your health care provider about genetic screening.  If you are at high risk for breast cancer, talk to your health care provider about having an MRI and a mammogram every year.  Breast cancer gene (BRCA) assessment is recommended for women who have family members with BRCA-related cancers. BRCA-related cancers include: ? Breast. ? Ovarian. ? Tubal. ? Peritoneal cancers.  Results of the assessment will determine the need for genetic counseling and BRCA1 and BRCA2 testing.  Cervical Cancer Your health care provider may recommend that you be screened regularly for cancer of the pelvic organs (ovaries, uterus, and vagina). This screening involves a pelvic examination, including checking for microscopic changes to the surface of your cervix (Pap test). You may be encouraged to have this screening done every 3 years, beginning at age 58.  For women ages 27-65, health care providers may recommend pelvic exams and Pap testing every 3 years, or they may recommend the Pap and pelvic exam, combined with testing for human papilloma virus (HPV), every 5 years. Some types of HPV increase your risk of cervical cancer. Testing for HPV may also be done on women of any age with unclear Pap test results.  Other health care providers may not recommend any screening for nonpregnant women who are considered low risk for pelvic cancer and who do not have symptoms. Ask your health care provider if a screening pelvic exam is right for you.  If you have had past treatment for cervical cancer or a condition that could lead to cancer, you need Pap tests and screening for cancer for at least 20 years  after your treatment. If Pap tests have been discontinued, your risk factors (such as having a new sexual partner) need to be reassessed to determine if screening should resume. Some women have medical problems that increase the chance of getting cervical cancer. In these cases, your health care provider may recommend more frequent screening and Pap tests.  Colorectal Cancer  This type of cancer can be detected and often prevented.  Routine colorectal cancer screening usually begins at 82 years of age and continues through 82 years of age.  Your health care provider may recommend screening at an earlier age if you have risk factors for colon cancer.  Your health care provider may also recommend using home test kits to check for hidden blood in the stool.  A small camera at the end of a tube can be used to examine your colon directly (sigmoidoscopy or colonoscopy). This is done to check for the earliest forms of colorectal cancer.  Routine screening usually begins at age 81.  Direct examination of the colon should be repeated every 5-10 years through 82 years of age. However, you may need to be screened more often if early forms of precancerous polyps or small growths are found.  Skin Cancer  Check your skin from head to toe regularly.  Tell your health care provider about any new moles or changes in moles, especially if there is a change in a mole's shape or color.  Also tell your health care provider if you have a mole that is larger than the size of a pencil eraser.  Always use sunscreen. Apply sunscreen liberally and repeatedly throughout the day.  Protect yourself by wearing long sleeves, pants, a wide-brimmed hat, and sunglasses whenever you are outside.  Heart disease, diabetes, and high blood pressure  High blood pressure causes heart disease and increases the risk of stroke. High blood pressure is more likely to develop in: ? People who have blood pressure in the high end of  the normal range (130-139/85-89 mm Hg). ? People who are overweight or obese. ? People who are African American.  If you are 37-49 years of age, have your blood pressure checked every 3-5 years. If you are 64 years of age or older, have your blood pressure checked every year. You should have your blood pressure measured twice-once when you are at a hospital or clinic, and once when you are not at a hospital or clinic. Record the average of the two measurements. To check your blood pressure when you are not at a hospital or clinic, you can use: ? An automated blood pressure machine at a pharmacy. ? A home blood pressure monitor.  If you are between 27 years and 49 years old, ask your health care provider if you should take aspirin to prevent strokes.  Have regular diabetes screenings. This involves taking a blood sample to check your fasting blood sugar level. ? If you are at a normal weight and have a low risk for diabetes, have this test once every three years after 82 years of age. ? If you are overweight and have a high risk for diabetes, consider being tested at a younger age or more often. Preventing infection Hepatitis B  If you have a higher risk for hepatitis B, you should be screened for this virus. You are considered at high risk for hepatitis B if: ? You were born in a country where hepatitis B is common. Ask your health care provider which countries are considered high risk. ? Your parents were born in a high-risk country, and you have not been immunized against hepatitis B (hepatitis B vaccine). ? You have HIV or AIDS. ? You use needles to inject street drugs. ? You live with someone who has hepatitis B. ? You have had sex with someone who has hepatitis B. ? You get hemodialysis treatment. ? You take certain medicines for conditions, including cancer, organ transplantation, and autoimmune conditions.  Hepatitis C  Blood testing is recommended for: ? Everyone born from [birth year]  through 1963. ? Anyone with known risk factors for hepatitis C.  Sexually transmitted infections (STIs)  You should be screened for sexually transmitted infections (STIs) including gonorrhea and chlamydia if: ? You are sexually active and are younger than 82 years of age. ? You are older than 82 years of age and your health care provider tells you that you are at risk for this type of infection. ? Your sexual activity  has changed since you were last screened and you are at an increased risk for chlamydia or gonorrhea. Ask your health care provider if you are at risk.  If you do not have HIV, but are at risk, it may be recommended that you take a prescription medicine daily to prevent HIV infection. This is called pre-exposure prophylaxis (PrEP). You are considered at risk if: ? You are sexually active and do not regularly use condoms or know the HIV status of your partner(s). ? You take drugs by injection. ? You are sexually active with a partner who has HIV.  Talk with your health care provider about whether you are at high risk of being infected with HIV. If you choose to begin PrEP, you should first be tested for HIV. You should then be tested every 3 months for as long as you are taking PrEP. Pregnancy  If you are premenopausal and you may become pregnant, ask your health care provider about preconception counseling.  If you may become pregnant, take 400 to 800 micrograms (mcg) of folic acid every day.  If you want to prevent pregnancy, talk to your health care provider about birth control (contraception). Osteoporosis and menopause  Osteoporosis is a disease in which the bones lose minerals and strength with aging. This can result in serious bone fractures. Your risk for osteoporosis can be identified using a bone density scan.  If you are 36 years of age or older, or if you are at risk for osteoporosis and fractures, ask your health care provider if you should be screened.  Ask  your health care provider whether you should take a calcium or vitamin D supplement to lower your risk for osteoporosis.  Menopause may have certain physical symptoms and risks.  Hormone replacement therapy may reduce some of these symptoms and risks. Talk to your health care provider about whether hormone replacement therapy is right for you. Follow these instructions at home:  Schedule regular health, dental, and eye exams.  Stay current with your immunizations.  Do not use any tobacco products including cigarettes, chewing tobacco, or electronic cigarettes.  If you are pregnant, do not drink alcohol.  If you are breastfeeding, limit how much and how often you drink alcohol.  Limit alcohol intake to no more than 1 drink per day for nonpregnant women. One drink equals 12 ounces of beer, 5 ounces of wine, or 1 ounces of hard liquor.  Do not use street drugs.  Do not share needles.  Ask your health care provider for help if you need support or information about quitting drugs.  Tell your health care provider if you often feel depressed.  Tell your health care provider if you have ever been abused or do not feel safe at home. This information is not intended to replace advice given to you by your health care provider. Make sure you discuss any questions you have with your health care provider. Document Released: 04/13/2011 Document Revised: 03/05/2016 Document Reviewed: 07/02/2015 Elsevier Interactive Patient Education  [employer].  It is important to avoid accidents which may result in broken bones.  Here are a few ideas on how to make your home safer so you will be less likely to trip or fall.  1. Use nonskid mats or non slip strips in your shower or tub, on your bathroom floor and around sinks.  If you know that you have spilled water, wipe it up! 2. In the bathroom, it is important to have properly  installed grab bars on the walls or on the edge of the tub.  Towel racks  are NOT strong enough for you to hold onto or to pull on for support. 3. Stairs and hallways should have enough light.  Add lamps or night lights if you need ore light. 4. It is good to have handrails on both sides of the stairs if possible.  Always fix broken handrails right away. 5. It is important to see the edges of steps.  Paint the edges of outdoor steps white so you can see them better.  Put colored tape on the edge of inside steps. 6. Throw-rugs are dangerous because they can slide.  Removing the rugs is the best idea, but if they must stay, add adhesive carpet tape to prevent slipping. 7. Do not keep things on stairs or in the halls.  Remove small furniture that blocks the halls as it may cause you to trip.  Keep telephone and electrical cords out of the way where you walk. 8. Always were sturdy, rubber-soled shoes for good support.  Never wear just socks, especially on the stairs.  Socks may cause you to slip or fall.  Do not wear full-length housecoats as you can easily trip on the bottom.  9. Place the things you use the most on the shelves that are the easiest to reach.  If you use a stepstool, make sure it is in good condition.  If you feel unsteady, DO NOT climb, ask for help. 10. If a health professional advises you to use a cane or walker, do not be ashamed.  These items can keep you from falling and breaking your bones.

## 2018-07-21 DIAGNOSIS — M2550 Pain in unspecified joint: Secondary | ICD-10-CM | POA: Diagnosis not present

## 2018-07-21 DIAGNOSIS — R42 Dizziness and giddiness: Secondary | ICD-10-CM | POA: Diagnosis not present

## 2018-07-21 DIAGNOSIS — M0609 Rheumatoid arthritis without rheumatoid factor, multiple sites: Secondary | ICD-10-CM | POA: Diagnosis not present

## 2018-07-21 DIAGNOSIS — Z6830 Body mass index (BMI) 30.0-30.9, adult: Secondary | ICD-10-CM | POA: Diagnosis not present

## 2018-07-21 DIAGNOSIS — M65332 Trigger finger, left middle finger: Secondary | ICD-10-CM | POA: Diagnosis not present

## 2018-07-21 DIAGNOSIS — Z79899 Other long term (current) drug therapy: Secondary | ICD-10-CM | POA: Diagnosis not present

## 2018-07-21 DIAGNOSIS — M150 Primary generalized (osteo)arthritis: Secondary | ICD-10-CM | POA: Diagnosis not present

## 2018-07-21 DIAGNOSIS — E669 Obesity, unspecified: Secondary | ICD-10-CM | POA: Diagnosis not present

## 2018-08-17 ENCOUNTER — Other Ambulatory Visit: Payer: Self-pay | Admitting: Internal Medicine

## 2018-08-17 DIAGNOSIS — I10 Essential (primary) hypertension: Secondary | ICD-10-CM

## 2018-10-20 DIAGNOSIS — Z6831 Body mass index (BMI) 31.0-31.9, adult: Secondary | ICD-10-CM | POA: Diagnosis not present

## 2018-10-20 DIAGNOSIS — Z79899 Other long term (current) drug therapy: Secondary | ICD-10-CM | POA: Diagnosis not present

## 2018-10-20 DIAGNOSIS — M2550 Pain in unspecified joint: Secondary | ICD-10-CM | POA: Diagnosis not present

## 2018-10-20 DIAGNOSIS — R5382 Chronic fatigue, unspecified: Secondary | ICD-10-CM | POA: Diagnosis not present

## 2018-10-20 DIAGNOSIS — R42 Dizziness and giddiness: Secondary | ICD-10-CM | POA: Diagnosis not present

## 2018-10-20 DIAGNOSIS — M0609 Rheumatoid arthritis without rheumatoid factor, multiple sites: Secondary | ICD-10-CM | POA: Diagnosis not present

## 2018-10-20 DIAGNOSIS — M150 Primary generalized (osteo)arthritis: Secondary | ICD-10-CM | POA: Diagnosis not present

## 2018-10-20 DIAGNOSIS — E669 Obesity, unspecified: Secondary | ICD-10-CM | POA: Diagnosis not present

## 2018-10-20 DIAGNOSIS — M65332 Trigger finger, left middle finger: Secondary | ICD-10-CM | POA: Diagnosis not present

## 2018-11-16 ENCOUNTER — Telehealth: Payer: Self-pay | Admitting: Internal Medicine

## 2018-11-16 NOTE — Telephone Encounter (Signed)
Called patient to advise her that the simvastatin was stopped in August. She has an appointment in March and she will discuss with her provider if she needs to take a different statin

## 2018-11-16 NOTE — Telephone Encounter (Signed)
PCP stopped statin to see if her symptoms of joint pain improve.

## 2018-11-16 NOTE — Telephone Encounter (Signed)
Copied from [facility] [id number]. Topic: Quick Communication - Rx Refill/Question >> Nov 16, 2018 10:46 AM Sheran Luz wrote: Medication: simvastatin (ZOCOR) 40 MG tablet   Patient is requesting a refill of this medication. Please advise.   Preferred Pharmacy (with phone number or street name):[facility] - [place], [place] [phone] (Phone) [phone] (Fax)

## 2018-12-15 ENCOUNTER — Ambulatory Visit (INDEPENDENT_AMBULATORY_CARE_PROVIDER_SITE_OTHER): Payer: Medicare HMO | Admitting: Internal Medicine

## 2018-12-15 ENCOUNTER — Encounter: Payer: Self-pay | Admitting: Internal Medicine

## 2018-12-15 ENCOUNTER — Other Ambulatory Visit (INDEPENDENT_AMBULATORY_CARE_PROVIDER_SITE_OTHER): Payer: Medicare HMO

## 2018-12-15 ENCOUNTER — Telehealth: Payer: Self-pay | Admitting: Internal Medicine

## 2018-12-15 VITALS — BP 130/86 | HR 59 | Temp 98.8°F | Resp 16 | Ht 64.0 in | Wt 182.8 lb

## 2018-12-15 DIAGNOSIS — R7309 Other abnormal glucose: Secondary | ICD-10-CM | POA: Diagnosis not present

## 2018-12-15 DIAGNOSIS — M170 Bilateral primary osteoarthritis of knee: Secondary | ICD-10-CM

## 2018-12-15 DIAGNOSIS — E785 Hyperlipidemia, unspecified: Secondary | ICD-10-CM

## 2018-12-15 DIAGNOSIS — I10 Essential (primary) hypertension: Secondary | ICD-10-CM | POA: Diagnosis not present

## 2018-12-15 DIAGNOSIS — M05711 Rheumatoid arthritis with rheumatoid factor of right shoulder without organ or systems involvement: Secondary | ICD-10-CM | POA: Diagnosis not present

## 2018-12-15 DIAGNOSIS — M05712 Rheumatoid arthritis with rheumatoid factor of left shoulder without organ or systems involvement: Secondary | ICD-10-CM

## 2018-12-15 LAB — CBC WITH DIFFERENTIAL/PLATELET
Basophils Absolute: 0 10*3/uL (ref 0.0–0.1)
Basophils Relative: 0.6 % (ref 0.0–3.0)
EOS PCT: 2.6 % (ref 0.0–5.0)
Eosinophils Absolute: 0.2 10*3/uL (ref 0.0–0.7)
HCT: 38.9 % (ref 36.0–46.0)
Hemoglobin: 13.3 g/dL (ref 12.0–15.0)
Lymphocytes Relative: 18.8 % (ref 12.0–46.0)
Lymphs Abs: 1.5 10*3/uL (ref 0.7–4.0)
MCHC: 34.2 g/dL (ref 30.0–36.0)
MCV: 96.3 fl (ref 78.0–100.0)
Monocytes Absolute: 0.7 10*3/uL (ref 0.1–1.0)
Monocytes Relative: 8.1 % (ref 3.0–12.0)
NEUTROS PCT: 69.9 % (ref 43.0–77.0)
Neutro Abs: 5.7 10*3/uL (ref 1.4–7.7)
Platelets: 183 10*3/uL (ref 150.0–400.0)
RBC: 4.04 Mil/uL (ref 3.87–5.11)
RDW: 14.9 % (ref 11.5–15.5)
WBC: 8.1 10*3/uL (ref 4.0–10.5)

## 2018-12-15 LAB — COMPREHENSIVE METABOLIC PANEL
ALK PHOS: 87 U/L (ref 39–117)
ALT: 18 U/L (ref 0–35)
AST: 20 U/L (ref 0–37)
Albumin: 4.2 g/dL (ref 3.5–5.2)
BUN: 17 mg/dL (ref 6–23)
CO2: 32 mEq/L (ref 19–32)
Calcium: 9.6 mg/dL (ref 8.4–10.5)
Chloride: 103 mEq/L (ref 96–112)
Creatinine, Ser: 0.97 mg/dL (ref 0.40–1.20)
GFR: 66 mL/min (ref >60.00)
Glucose, Bld: 118 mg/dL — ABNORMAL HIGH (ref 70–99)
Potassium: 4.3 mEq/L (ref 3.5–5.1)
Sodium: 141 mEq/L (ref 135–145)
Total Bilirubin: 0.6 mg/dL (ref 0.2–1.2)
Total Protein: 7 g/dL (ref 6.0–8.3)

## 2018-12-15 LAB — LIPID PANEL
Cholesterol: 193 mg/dL (ref 0–200)
HDL: 111.4 mg/dL (ref >39.00)
LDL Cholesterol: 67 mg/dL (ref 0–99)
NonHDL: 81.41
Total CHOL/HDL Ratio: 2
Triglycerides: 73 mg/dL (ref 0.0–149.0)
VLDL: 14.6 mg/dL (ref 0.0–40.0)

## 2018-12-15 LAB — CK: Total CK: 145 U/L (ref 7–177)

## 2018-12-15 LAB — TSH: TSH: 2.06 u[IU]/mL (ref 0.35–4.50)

## 2018-12-15 MED ORDER — ATORVASTATIN CALCIUM 10 MG PO TABS: 10.0000 mg | ORAL_TABLET | Freq: Every day | ORAL | 1 refills | Status: DC

## 2018-12-15 NOTE — Progress Notes (Signed)
Subjective:  Patient ID: Briana [name], female    DOB: [birth year]  Age: 85 y.o. MRN: [id number]  CC: Hypertension; Hyperlipidemia; and Annual Exam   HPI Briana [name] presents for f/up - I had previously asked her to stop taking simvastatin because I was concerned about a drug interaction with amlodipine.  She tells me she has continued to take simvastatin and she is not comfortable not taking a statin to reduce the risk of heart attack or stroke.  She has family members who take atorvastatin and she wants to switch to that as well.  She does occasionally have arthralgias and myalgias.  Outpatient Medications Prior to Visit  Medication Sig Dispense Refill  . Cholecalciferol (VITAMIN D) 2000 UNITS tablet Take 2,000 Units by mouth daily.    . fexofenadine (ALLEGRA) 180 MG tablet Take 180 mg by mouth daily.    . folic acid (FOLVITE) 1 MG tablet Take 1 mg by mouth daily.    . methotrexate (RHEUMATREX) 2.5 MG tablet Take 20 mg by mouth once a week. Caution:Chemotherapy. Protect from light.  Taken on Fridays.    Marland Kitchen amLODipine (NORVASC) 10 MG tablet TAKE 1 TABLET (10 MG TOTAL) BY MOUTH DAILY. 90 tablet 1  . atenolol (TENORMIN) 100 MG tablet TAKE 1 TABLET (100 MG TOTAL) BY MOUTH DAILY. 90 tablet 1  . eszopiclone (LUNESTA) 1 MG TABS tablet Take 1 tablet (1 mg total) by mouth at bedtime as needed for sleep. Take immediately before bedtime 90 tablet 1  . traMADol (ULTRAM) 50 MG tablet Take 1 tablet (50 mg total) by mouth every 12 (twelve) hours as needed for moderate pain or severe pain. 180 tablet 1   No facility-administered medications prior to visit.     ROS Review of Systems  Constitutional: Negative.  Negative for diaphoresis, fatigue and unexpected weight change.  HENT: Negative.   Respiratory: Negative.  Negative for cough, chest tightness, shortness of breath and wheezing.   Cardiovascular: Negative for chest pain, palpitations and leg swelling.  Gastrointestinal: Negative for  abdominal pain, constipation, diarrhea, nausea and vomiting.  Endocrine: Negative.   Genitourinary: Negative.  Negative for difficulty urinating and dysuria.  Musculoskeletal: Positive for arthralgias and myalgias. Negative for back pain.  Skin: Negative.  Negative for color change, pallor and rash.  Neurological: Negative.  Negative for dizziness, weakness, light-headedness, numbness and headaches.  Hematological: Negative for adenopathy. Does not bruise/bleed easily.  Psychiatric/Behavioral: Negative.     Objective:  BP 130/86 (BP Location: Left Arm, Patient Position: Sitting, Cuff Size: Normal)   Pulse (!) 59   Temp 98.8 F (37.1 C) (Oral)   Resp 16   Ht 5\' 4"  (1.626 m)   Wt 182 lb 12 oz (82.9 kg)   SpO2 98%   BMI 31.37 kg/m   BP Readings from Last 3 Encounters:  12/15/18 130/86  06/02/18 (!) 141/60  06/02/18 (!) 141/60    Wt Readings from Last 3 Encounters:  12/15/18 182 lb 12 oz (82.9 kg)  06/02/18 176 lb (79.8 kg)  06/02/18 176 lb (79.8 kg)    Physical Exam Vitals signs reviewed.  Constitutional:      Appearance: She is not ill-appearing or diaphoretic.  HENT:     Nose: Nose normal. No congestion or rhinorrhea.     Mouth/Throat:     Pharynx: Oropharynx is clear. No oropharyngeal exudate or posterior oropharyngeal erythema.  Eyes:     General: No scleral icterus.    Conjunctiva/sclera: Conjunctivae normal.  Neck:  Musculoskeletal: Normal range of motion and neck supple. No neck rigidity.  Cardiovascular:     Rate and Rhythm: Normal rate and regular rhythm.     Heart sounds: No murmur. No gallop.   Pulmonary:     Effort: Pulmonary effort is normal.     Breath sounds: No stridor. No wheezing, rhonchi or rales.  Abdominal:     General: Bowel sounds are normal.     Palpations: There is no hepatomegaly, splenomegaly or mass.     Tenderness: There is no abdominal tenderness. There is no guarding.  Musculoskeletal: Normal range of motion.        General: No  swelling.     Right lower leg: No edema.     Left lower leg: No edema.  Lymphadenopathy:     Cervical: No cervical adenopathy.  Skin:    General: Skin is warm and dry.     Coloration: Skin is not pale.     Findings: No erythema or rash.  Neurological:     General: No focal deficit present.     Mental Status: She is oriented to person, place, and time. Mental status is at baseline.     Lab Results  Component Value Date   WBC 8.1 12/15/2018   HGB 13.3 12/15/2018   HCT 38.9 12/15/2018   PLT 183.0 12/15/2018   GLUCOSE 118 (H) 12/15/2018   CHOL 193 12/15/2018   TRIG 73.0 12/15/2018   HDL 111.40 12/15/2018   LDLDIRECT 104.5 04/25/2010   LDLCALC 67 12/15/2018   ALT 18 12/15/2018   AST 20 12/15/2018   NA 141 12/15/2018   K 4.3 12/15/2018   CL 103 12/15/2018   CREATININE 0.97 12/15/2018   BUN 17 12/15/2018   CO2 32 12/15/2018   TSH 2.06 12/15/2018   INR 0.92 06/05/2014   HGBA1C 6.2 12/02/2017    Dg Chest 2 View  Result Date: 02/27/2017 CLINICAL DATA:  Pt reports she felt dizziness (room spinning) 45 minutes ago and then began to have emesis. Pt reports she is no longer dizzy. But does have dry heaves. No CP or SOB.; HTN; non smoker EXAM: CHEST  2 VIEW COMPARISON:  03/10/2016 FINDINGS: The heart size and mediastinal contours are within normal limits. Both lungs are clear. No pleural effusion or pneumothorax. The visualized skeletal structures are unremarkable. IMPRESSION: No active cardiopulmonary disease. Electronically Signed   By: Lajean Manes M.D.   On: 02/27/2017 14:11   Ct Head Wo Contrast  Result Date: 02/27/2017 CLINICAL DATA:  Dizziness beginning today with vomiting. EXAM: CT HEAD WITHOUT CONTRAST TECHNIQUE: Contiguous axial images were obtained from the base of the skull through the vertex without intravenous contrast. COMPARISON:  None. FINDINGS: Brain: Ventricles, cisterns and other CSF spaces are within normal. Minimal age related atrophic change. Mild chronic  ischemic microvascular disease. No evidence of mass, mass effect, shift of midline structures or acute hemorrhage. No evidence to suggest acute infarction. Vascular: No hyperdense vessel or unexpected calcification. Skull: Within normal. Sinuses/Orbits: Within normal. Other: None. IMPRESSION: No acute intracranial findings. Mild chronic ischemic microvascular disease. Subtle age related atrophic change. Electronically Signed   By: Marin Olp M.D.   On: 02/27/2017 14:05    Assessment & Plan:   Leza was seen today for hypertension, hyperlipidemia and annual exam.  Diagnoses and all orders for this visit:  Essential hypertension, benign- Her blood pressure is well controlled.  Electrolytes and renal function are normal. -     CBC with Differential/Platelet; Future -  Comprehensive metabolic panel; Future -     amLODipine (NORVASC) 10 MG tablet; Take 1 tablet (10 mg total) by mouth daily. -     atenolol (TENORMIN) 100 MG tablet; Take 1 tablet (100 mg total) by mouth daily.  Other abnormal glucose -     Comprehensive metabolic panel; Future  Hyperlipidemia with target LDL less than 130- Her labs are negative for myopathy or myositis.  To avoid drug interactions with the CCB I have asked her to switch to atorvastatin. -     Comprehensive metabolic panel; Future -     Lipid panel; Future -     TSH; Future -     CK; Future -     atorvastatin (LIPITOR) 10 MG tablet; Take 1 tablet (10 mg total) by mouth daily.  Primary osteoarthritis of both knees -     traMADol (ULTRAM) 50 MG tablet; Take 1 tablet (50 mg total) by mouth every 12 (twelve) hours as needed for moderate pain or severe pain.  Rheumatoid arthritis involving both shoulders with positive rheumatoid factor (HCC) -     traMADol (ULTRAM) 50 MG tablet; Take 1 tablet (50 mg total) by mouth every 12 (twelve) hours as needed for moderate pain or severe pain.   I have discontinued Petra M. Maese's eszopiclone. I am also having her  start on atorvastatin. Additionally, I am having her maintain her folic acid, methotrexate, Vitamin D, fexofenadine, amLODipine, atenolol, and traMADol.  Meds ordered this encounter  Medications  . atorvastatin (LIPITOR) 10 MG tablet    Sig: Take 1 tablet (10 mg total) by mouth daily.    Dispense:  90 tablet    Refill:  1  . amLODipine (NORVASC) 10 MG tablet    Sig: Take 1 tablet (10 mg total) by mouth daily.    Dispense:  90 tablet    Refill:  1  . atenolol (TENORMIN) 100 MG tablet    Sig: Take 1 tablet (100 mg total) by mouth daily.    Dispense:  90 tablet    Refill:  1  . traMADol (ULTRAM) 50 MG tablet    Sig: Take 1 tablet (50 mg total) by mouth every 12 (twelve) hours as needed for moderate pain or severe pain.    Dispense:  180 tablet    Refill:  1     Follow-up: Return in about 6 months (around 06/17/2019).  Scarlette Calico, MD

## 2018-12-15 NOTE — Telephone Encounter (Signed)
Copied from [place] [id number]. Topic: General - Other >> Dec 15, 2018  3:05 PM Oneta Rack wrote: Relation to pt: self  Call back number: [phone]   Reason for call:  Patient was seen by Dr. Ronnald Ramp today and was advised a new medication would be sent in today based off of lab results, patient checking on the status, please advise

## 2018-12-15 NOTE — Patient Instructions (Signed)

## 2018-12-16 MED ORDER — ATENOLOL 100 MG PO TABS: 100.0000 mg | ORAL_TABLET | Freq: Every day | ORAL | 1 refills | Status: DC

## 2018-12-16 MED ORDER — AMLODIPINE BESYLATE 10 MG PO TABS: 10.0000 mg | ORAL_TABLET | Freq: Every day | ORAL | 1 refills | Status: DC

## 2018-12-16 MED ORDER — TRAMADOL HCL 50 MG PO TABS: 50.0000 mg | ORAL_TABLET | Freq: Two times a day (BID) | ORAL | 1 refills | Status: DC | PRN

## 2018-12-16 NOTE — Telephone Encounter (Signed)
Patient advised that lipitor has been sent in to her mail order pharmacy----patient is also requesting refills on atenolol, tramadol and amlodipine---please send to her mail order pharmacy

## 2018-12-19 NOTE — Telephone Encounter (Signed)
All of the requested medications were sent in on 12/16/2018

## 2019-01-18 DIAGNOSIS — R42 Dizziness and giddiness: Secondary | ICD-10-CM | POA: Diagnosis not present

## 2019-01-18 DIAGNOSIS — E669 Obesity, unspecified: Secondary | ICD-10-CM | POA: Diagnosis not present

## 2019-01-18 DIAGNOSIS — Z79899 Other long term (current) drug therapy: Secondary | ICD-10-CM | POA: Diagnosis not present

## 2019-01-18 DIAGNOSIS — Z6831 Body mass index (BMI) 31.0-31.9, adult: Secondary | ICD-10-CM | POA: Diagnosis not present

## 2019-01-18 DIAGNOSIS — M65332 Trigger finger, left middle finger: Secondary | ICD-10-CM | POA: Diagnosis not present

## 2019-01-18 DIAGNOSIS — M0609 Rheumatoid arthritis without rheumatoid factor, multiple sites: Secondary | ICD-10-CM | POA: Diagnosis not present

## 2019-01-18 DIAGNOSIS — R5382 Chronic fatigue, unspecified: Secondary | ICD-10-CM | POA: Diagnosis not present

## 2019-01-18 DIAGNOSIS — M2550 Pain in unspecified joint: Secondary | ICD-10-CM | POA: Diagnosis not present

## 2019-01-18 DIAGNOSIS — M150 Primary generalized (osteo)arthritis: Secondary | ICD-10-CM | POA: Diagnosis not present

## 2019-04-19 DIAGNOSIS — M2550 Pain in unspecified joint: Secondary | ICD-10-CM | POA: Diagnosis not present

## 2019-04-19 DIAGNOSIS — Z6831 Body mass index (BMI) 31.0-31.9, adult: Secondary | ICD-10-CM | POA: Diagnosis not present

## 2019-04-19 DIAGNOSIS — E669 Obesity, unspecified: Secondary | ICD-10-CM | POA: Diagnosis not present

## 2019-04-19 DIAGNOSIS — Z79899 Other long term (current) drug therapy: Secondary | ICD-10-CM | POA: Diagnosis not present

## 2019-04-19 DIAGNOSIS — M0609 Rheumatoid arthritis without rheumatoid factor, multiple sites: Secondary | ICD-10-CM | POA: Diagnosis not present

## 2019-04-19 DIAGNOSIS — Z7952 Long term (current) use of systemic steroids: Secondary | ICD-10-CM | POA: Diagnosis not present

## 2019-04-19 DIAGNOSIS — M65332 Trigger finger, left middle finger: Secondary | ICD-10-CM | POA: Diagnosis not present

## 2019-04-19 DIAGNOSIS — R5382 Chronic fatigue, unspecified: Secondary | ICD-10-CM | POA: Diagnosis not present

## 2019-04-19 DIAGNOSIS — M150 Primary generalized (osteo)arthritis: Secondary | ICD-10-CM | POA: Diagnosis not present

## 2019-04-21 ENCOUNTER — Other Ambulatory Visit: Payer: Self-pay | Admitting: Physician Assistant

## 2019-04-21 DIAGNOSIS — Z7952 Long term (current) use of systemic steroids: Secondary | ICD-10-CM

## 2019-04-27 ENCOUNTER — Other Ambulatory Visit: Payer: Self-pay | Admitting: Internal Medicine

## 2019-04-27 DIAGNOSIS — E785 Hyperlipidemia, unspecified: Secondary | ICD-10-CM

## 2019-04-28 ENCOUNTER — Ambulatory Visit (INDEPENDENT_AMBULATORY_CARE_PROVIDER_SITE_OTHER): Payer: Medicare HMO | Admitting: Family

## 2019-04-28 ENCOUNTER — Encounter: Payer: Self-pay | Admitting: Family

## 2019-04-28 ENCOUNTER — Ambulatory Visit
Admission: RE | Admit: 2019-04-28 | Discharge: 2019-04-28 | Disposition: A | Discharge status: Home / Self Care | Payer: Medicare HMO | Source: Ambulatory Visit | Attending: Family | Admitting: Family

## 2019-04-28 ENCOUNTER — Other Ambulatory Visit: Payer: Self-pay

## 2019-04-28 DIAGNOSIS — G44309 Post-traumatic headache, unspecified, not intractable: Secondary | ICD-10-CM

## 2019-04-28 DIAGNOSIS — R42 Dizziness and giddiness: Secondary | ICD-10-CM | POA: Diagnosis not present

## 2019-04-28 DIAGNOSIS — R519 Headache, unspecified: Secondary | ICD-10-CM

## 2019-04-28 DIAGNOSIS — S0990XA Unspecified injury of head, initial encounter: Secondary | ICD-10-CM | POA: Diagnosis not present

## 2019-04-28 DIAGNOSIS — R51 Headache: Secondary | ICD-10-CM | POA: Diagnosis not present

## 2019-04-28 NOTE — Progress Notes (Signed)
Briana [name] is a 85 y.o. female with the following history as recorded in Epic[employer]:  Patient Active Problem List   Diagnosis Date Noted  . Left lumbar radiculitis 03/07/2017  . Neck pain, bilateral 09/03/2014  . Essential hypertension, benign 06/19/2014  . RA (rheumatoid arthritis) ([place]) 06/19/2014  . Constipation due to opioid therapy 06/19/2014  . Other abnormal glucose 04/19/2014  . Routine health maintenance 05/30/2012  . INSOMNIA UNSPECIFIED 10/31/2007  . Hyperlipidemia with target LDL less than 130 10/28/2007  . Seasonal allergic rhinitis 10/28/2007  . DJD (degenerative joint disease) of knee 10/28/2007    Current Outpatient Medications  Medication Sig Dispense Refill  . amLODipine (NORVASC) 10 MG tablet Take 1 tablet (10 mg total) by mouth daily. 90 tablet 1  . atenolol (TENORMIN) 100 MG tablet Take 1 tablet (100 mg total) by mouth daily. 90 tablet 1  . atorvastatin (LIPITOR) 10 MG tablet TAKE 1 TABLET EVERY DAY 90 tablet 0  . Cholecalciferol (VITAMIN D) 2000 UNITS tablet Take 2,000 Units by mouth daily.    . fexofenadine (ALLEGRA) 180 MG tablet Take 180 mg by mouth daily.    . folic acid (FOLVITE) 1 MG tablet Take 1 mg by mouth daily.    . methotrexate (RHEUMATREX) 2.5 MG tablet Take 20 mg by mouth once a week. Caution:Chemotherapy. Protect from light.  Taken on Fridays.    . traMADol (ULTRAM) 50 MG tablet Take 1 tablet (50 mg total) by mouth every 12 (twelve) hours as needed for moderate pain or severe pain. 180 tablet 1   No current facility-administered medications for this visit.     Allergies: Ibuprofen, Percocet [oxycodone-acetaminophen], Simvastatin, Diltiazem hcl, Hydrochlorothiazide, Lisinopril, and Verapamil  Past Medical History:  Diagnosis Date  . ACUTE URIS OF UNSPECIFIED SITE 11/26/2008  . ALLERGIC ARTHRITIS OTHER SPECIFIED SITES 10/22/2010   OA + RA  . ALLERGIC RHINITIS 10/28/2007  . ARTHRITIS, KNEES, BILATERAL 10/28/2007  . BACK PAIN 04/12/2009  .  BUNIONECTOMY, HX OF 10/28/2007  . CELLULITIS&ABSCESS OF HAND EXCEPT FINGERS&THUMB 10/23/2010  . Complication of anesthesia    itching and makes me crazy  . Family history of anesthesia complication    DAUGHTER HAD HIVES AND " WENT WILD "  . HYPERLIPIDEMIA 10/28/2007  . HYPERTENSION 10/28/2007  . INSOMNIA UNSPECIFIED 10/31/2007  . MYOSITIS 10/22/2010  . NECK PAIN 12/03/2008  . Pain in joint, site unspecified 10/07/2010  . WRIST PAIN, LEFT 10/22/2010    Past Surgical History:  Procedure Laterality Date  . arthroscopic surgery and rotator cuff repair left shoulder    . arthroscopy right knee hx of surgery    . BUNIONECTOMY     hx of  . FOOT SURGERY     RIGHT  . hand surgery for broken finger, remote    . JOINT REPLACEMENT Right    knee  . TOTAL KNEE ARTHROPLASTY  07/01/2012   Procedure: TOTAL KNEE ARTHROPLASTY;  Surgeon: Yvette Rack., MD;  Location: [facility];  Service: Orthopedics;  Laterality: Right;  right total knee arthroplasty  . TOTAL KNEE ARTHROPLASTY Left 06/13/2014   dr Percell Miller  . TOTAL KNEE ARTHROPLASTY Left 06/13/2014   Procedure: TOTAL KNEE ARTHROPLASTY;  Surgeon: Ninetta Lights, MD;  Location: [facility];  Service: Orthopedics;  Laterality: Left;    Family History  Problem Relation Age of Onset  . Cancer Brother        lung cancer  . Coronary artery disease Other   . Heart attack Other   .  Cancer Sister        Lung, stomach    Social History   Tobacco Use  . Smoking status: Never Smoker  . Smokeless tobacco: Never Used  Substance Use Topics  . Alcohol use: No    Subjective:  Patient presents with her daughter; notes she tripped while working in her garden yesterday/ fell forward and hit her head; has been having increased headaches, dizziness in the past 24 hours; denies any blurred vision;   Objective:  Vitals:   04/28/19 1121  BP: 140/70  Pulse: 71  Temp: 97.8 F (36.6 C)  TempSrc: Oral  SpO2: 97%  Weight: 180 lb 1.3 oz (81.7 kg)  Height: 5\' 4"  (1.626 m)     General: Well developed, well nourished, in no acute distress  Skin : Warm and dry. Hematoma noted over right eye/ forehead; Head: Normocephalic and atraumatic  Eyes: Sclera and conjunctiva clear; pupils round and reactive to light; extraocular movements intact  Ears: External normal; canals clear; tympanic membranes normal  Oropharynx: Pink, supple. No suspicious lesions  Neck: Supple without thyromegaly, adenopathy  Lungs: Respirations unlabored; clear to auscultation bilaterally without wheeze, rales, rhonchi  CVS exam: normal rate and regular rhythm.  Neurologic: Alert and oriented; speech intact; face symmetrical; moves all extremities well; CNII-XII intact without focal deficit  Assessment:  1. Post-traumatic headache, not intractable, unspecified chronicity pattern   2. Acute nonintractable headache, unspecified headache type     Plan:  Due to fall, will schedule for CT today; reassurance that needs to continue to apply ice to affected area;   No follow-ups on file.  Orders Placed This Encounter  Procedures  . CT Head Wo Contrast    Standing Status:   Future    Standing Expiration Date:   07/28/2020    Order Specific Question:   Preferred imaging location?    Answer:   [id number]    Order Specific Question:   Radiology Contrast Protocol - do NOT remove file path    Answer:   [email or web address]    Requested Prescriptions    No prescriptions requested or ordered in this encounter

## 2019-05-31 DIAGNOSIS — Z1231 Encounter for screening mammogram for malignant neoplasm of breast: Secondary | ICD-10-CM | POA: Diagnosis not present

## 2019-05-31 LAB — HM MAMMOGRAPHY

## 2019-06-15 NOTE — Progress Notes (Addendum)
Subjective:   Briana [name] is a 85 y.o. female who presents for Medicare Annual (Subsequent) preventive examination. I connected with patient by a telephone and verified that I am speaking with the correct person using two identifiers. Patient stated full name and DOB. Patient gave permission to continue with telephonic visit. Patient's location was at home and Nurse's location was at [place] office.   Review of Systems:   Cardiac Risk Factors include: advanced age (>90men, >57 women);dyslipidemia;hypertension Sleep patterns: gets up 1-2 times nightly to void and sleeps 6-7 hours nightly.    Home Safety/Smoke Alarms: Feels safe in home. Smoke alarms in place.  Living environment; residence and Firearm Safety: 1-story house/ trailer, equipment: Radio producer, Type: [place] and Walkers, Type: Conservation officer, nature. Lives with son, no needs for DME, good support system Seat Belt Safety/Bike Helmet: Wears seat belt.      Objective:     Vitals: There were no vitals taken for this visit.  There is no height or weight on file to calculate BMI.  Advanced Directives 06/20/2019 06/02/2018 02/27/2017 11/10/2016 10/24/2015 10/23/2015 06/13/2014  Does Patient Have a Medical Advance Directive? Yes No No Yes Yes Yes No  Type of Paramedic of [place];Living will - - [place];Living will [place];Living will Living will;Healthcare Power of Attorney -  Does patient want to make changes to medical advance directive? - - - - No - Patient declined No - Patient declined -  Copy of [place] in Chart? No - copy requested - - No - copy requested Yes Yes -  Would patient like information on creating a medical advance directive? - Yes (ED - Information included in AVS) - - - - Yes - Educational materials given  Pre-existing out of facility DNR order (yellow form or pink MOST form) - - - - - - -    Tobacco Social History   Tobacco Use   Smoking Status Never Smoker  Smokeless Tobacco Never Used     Counseling given: Not Answered  Past Medical History:  Diagnosis Date  . ACUTE URIS OF UNSPECIFIED SITE 11/26/2008  . ALLERGIC ARTHRITIS OTHER SPECIFIED SITES 10/22/2010   OA + RA  . ALLERGIC RHINITIS 10/28/2007  . ARTHRITIS, KNEES, BILATERAL 10/28/2007  . BACK PAIN 04/12/2009  . BUNIONECTOMY, HX OF 10/28/2007  . CELLULITIS&ABSCESS OF HAND EXCEPT FINGERS&THUMB 10/23/2010  . Complication of anesthesia    itching and makes me crazy  . Family history of anesthesia complication    DAUGHTER HAD HIVES AND " WENT WILD "  . HYPERLIPIDEMIA 10/28/2007  . HYPERTENSION 10/28/2007  . INSOMNIA UNSPECIFIED 10/31/2007  . MYOSITIS 10/22/2010  . NECK PAIN 12/03/2008  . Pain in joint, site unspecified 10/07/2010  . WRIST PAIN, LEFT 10/22/2010   Past Surgical History:  Procedure Laterality Date  . arthroscopic surgery and rotator cuff repair left shoulder    . arthroscopy right knee hx of surgery    . BUNIONECTOMY     hx of  . FOOT SURGERY     RIGHT  . hand surgery for broken finger, remote    . JOINT REPLACEMENT Right    knee  . TOTAL KNEE ARTHROPLASTY  07/01/2012   Procedure: TOTAL KNEE ARTHROPLASTY;  Surgeon: Yvette Rack., MD;  Location: [facility];  Service: Orthopedics;  Laterality: Right;  right total knee arthroplasty  . TOTAL KNEE ARTHROPLASTY Left 06/13/2014   dr Percell Miller  . TOTAL KNEE ARTHROPLASTY Left  06/13/2014   Procedure: TOTAL KNEE ARTHROPLASTY;  Surgeon: Ninetta Lights, MD;  Location: [facility];  Service: Orthopedics;  Laterality: Left;   Family History  Problem Relation Age of Onset  . Cancer Brother        lung cancer  . Coronary artery disease Other   . Heart attack Other   . Cancer Sister        Lung, stomach   Social History   Socioeconomic History  . Marital status: Widowed    Spouse name: Not on file  . Number of children: 4  . Years of education: 28  . Highest education level: Not on file  Occupational History   . Occupation: ASSN II 1    Employer: [employer]  Social Needs  . Financial resource strain: Not hard at all  . Food insecurity    Worry: Never true    Inability: Never true  . Transportation needs    Medical: No    Non-medical: No  Tobacco Use  . Smoking status: Never Smoker  . Smokeless tobacco: Never Used  Substance and Sexual Activity  . Alcohol use: No  . Drug use: No  . Sexual activity: Not Currently  Lifestyle  . Physical activity    Days per week: 2 days    Minutes per session: 50 min  . Stress: Rather much  Relationships  . Social connections    Talks on phone: More than three times a week    Gets together: Never    Attends religious service: More than 4 times per year    Active member of club or organization: Yes    Attends meetings of clubs or organizations: More than 4 times per year    Relationship status: Widowed  Other Topics Concern  . 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    Outpatient Encounter Medications as of 06/20/2019  Medication Sig  . acetaminophen (TYLENOL) 650 MG CR tablet Take 650 mg by mouth every 8 (eight) hours as needed for pain.  Marland Kitchen amLODipine (NORVASC) 10 MG tablet Take 1 tablet (10 mg total) by mouth daily.  Marland Kitchen atenolol (TENORMIN) 100 MG tablet Take 1 tablet (100 mg total) by mouth daily.  Marland Kitchen atorvastatin (LIPITOR) 10 MG tablet TAKE 1 TABLET EVERY DAY  . b complex vitamins tablet Take 1 tablet by mouth daily.  . Cholecalciferol (VITAMIN D) 2000 UNITS tablet Take 2,000 Units by mouth daily.  . fexofenadine (ALLEGRA) 180 MG tablet Take 180 mg by mouth daily.  . folic acid (FOLVITE) 1 MG tablet Take 1 mg by mouth daily.  . methotrexate (RHEUMATREX) 2.5 MG tablet Take 20 mg by mouth once a week. Caution:Chemotherapy. Protect from light.  Taken on  Fridays.  . traMADol (ULTRAM) 50 MG tablet Take 1 tablet (50 mg total) by mouth every 12 (twelve) hours as needed for moderate pain or severe pain.   No facility-administered encounter medications on file as of 06/20/2019.     Activities of Daily Living In your present state of health, do you have any difficulty performing the following activities: 06/20/2019 12/16/2018  Hearing? N N  Vision? N N  Difficulty concentrating or making decisions? N Y  Walking or climbing stairs? N Y  Dressing or bathing?  N N  Doing errands, shopping? N Y  Conservation officer, nature and eating ? N -  Using the Toilet? N -  In the past six months, have you accidently leaked urine? N -  Do you have problems with loss of bowel control? N -  Managing your Medications? N -  Managing your Finances? N -  Housekeeping or managing your Housekeeping? N -  Some recent data might be hidden    Patient Care Team: Janith Lima, MD as PCP - General (Internal Medicine) Gavin Pound, MD as Consulting Physician (Rheumatology) Clent Jacks, MD as Consulting Physician (Ophthalmology) Gatha Mayer, MD as Consulting Physician (Gastroenterology)    Assessment:   This is a routine wellness examination for Jettie. Physical assessment deferred to PCP.  Exercise Activities and Dietary recommendations Current Exercise Habits: Home exercise routine, Type of exercise: walking, Time (Minutes): 25, Frequency (Times/Week): 3, Weekly Exercise (Minutes/Week): 75, Intensity: Mild, Exercise limited by: orthopedic condition(s) Diet (meal preparation, eat out, water intake, caffeinated beverages, dairy products, fruits and vegetables): in general, a "healthy" diet  , well balanced   Encouraged patient to increase daily water and healthy fluid intake.   Goals    . Patient Stated     Stay active physically and socially active. Enjoy life, church and love family.       Fall Risk Fall Risk  06/20/2019 12/16/2018 12/15/2018 06/02/2018 12/02/2017   Falls in the past year? 1 0 0 No No  Number falls in past yr: 0 - 0 - -  Injury with Fall? 1 - 0 - -  Risk for fall due to : Impaired balance/gait - - Impaired balance/gait;Impaired mobility;History of fall(s) -  Risk for fall due to: Comment - - - discussed fall precautions -  Follow up Falls prevention discussed - Falls evaluation completed - -   Is the patient's home free of loose throw rugs in walkways, pet beds, electrical cords, etc?   yes      Grab bars in the bathroom? yes      Handrails on the stairs?   yes      Adequate lighting?   yes  Depression Screen PHQ 2/9 Scores 06/20/2019 12/16/2018 06/02/2018 12/02/2017  PHQ - 2 Score 0 0 1 0  PHQ- 9 Score - - 6 -     Cognitive Function MMSE - Mini Mental State Exam 06/02/2018  Orientation to time 5  Orientation to Place 5  Registration 3  Attention/ Calculation 3  Recall 3  Language- name 2 objects 2  Language- repeat 1  Language- follow 3 step command 3  Language- read & follow direction 1  Write a sentence 1  Copy design 1  Total score 28       Ad8 score reviewed for issues:  Issues making decisions: no  Less interest in hobbies / activities: no  Repeats questions, stories (family complaining): no  Trouble using ordinary gadgets (microwave, computer, phone):no  Forgets the month or year: no  Mismanaging finances: no  Remembering appts: no  Daily problems with thinking and/or memory: no Ad8 score is= 0 Patient states she does not have any cognitive issues she is concerned about. Immunization History  Administered Date(s) Administered  . Influenza Split 07/03/2012  . Influenza, High Dose Seasonal PF 07/24/2013, 07/15/2018  . Influenza,inj,Quad PF,6+ Mos 08/06/2014  . Influenza-Unspecified 07/16/2015, 07/28/2016, 07/12/2017  . Pneumococcal Conjugate-13 10/23/2015  . Pneumococcal Polysaccharide-23 04/19/2014  . Td 05/30/2012  . Zoster 07/13/2015   Screening Tests Health  Maintenance  Topic Date Due  .  INFLUENZA VACCINE  05/13/2019  . TETANUS/TDAP  05/30/2022  . DEXA SCAN  Completed  . PNA vac Low Risk Adult  Completed      Plan:    Reviewed health maintenance screenings with patient today and relevant education, vaccines, and/or referrals were provided.   Continue to eat heart healthy diet (full of fruits, vegetables, whole grains, lean protein, water--limit salt, fat, and sugar intake) and increase physical activity as tolerated.  Continue doing brain stimulating activities (puzzles, reading, adult coloring books, staying active) to keep memory sharp.   I have personally reviewed and noted the following in the patient's chart:   . Medical and social history . Use of alcohol, tobacco or illicit drugs  . Current medications and supplements . Functional ability and status . Nutritional status . Physical activity . Advanced directives . List of other physicians . Screenings to include cognitive, depression, and falls . Referrals and appointments  In addition, I have reviewed and discussed with patient certain preventive protocols, quality metrics, and best practice recommendations. A written personalized care plan for preventive services as well as general preventive health recommendations were provided to patient.     Michiel Cowboy, RN  06/20/2019   Medical screening examination/treatment/procedure(s) were performed by non-physician practitioner and as supervising physician I was immediately available for consultation/collaboration. I agree with above. Scarlette Calico, MD

## 2019-06-20 ENCOUNTER — Ambulatory Visit (INDEPENDENT_AMBULATORY_CARE_PROVIDER_SITE_OTHER): Payer: Medicare HMO | Admitting: *Deleted

## 2019-06-20 DIAGNOSIS — Z0000 Encounter for general adult medical examination without abnormal findings: Secondary | ICD-10-CM | POA: Diagnosis not present

## 2019-07-20 DIAGNOSIS — Z6831 Body mass index (BMI) 31.0-31.9, adult: Secondary | ICD-10-CM | POA: Diagnosis not present

## 2019-07-20 DIAGNOSIS — Z79899 Other long term (current) drug therapy: Secondary | ICD-10-CM | POA: Diagnosis not present

## 2019-07-20 DIAGNOSIS — M2550 Pain in unspecified joint: Secondary | ICD-10-CM | POA: Diagnosis not present

## 2019-07-20 DIAGNOSIS — E669 Obesity, unspecified: Secondary | ICD-10-CM | POA: Diagnosis not present

## 2019-07-20 DIAGNOSIS — Z7952 Long term (current) use of systemic steroids: Secondary | ICD-10-CM | POA: Diagnosis not present

## 2019-07-20 DIAGNOSIS — M65332 Trigger finger, left middle finger: Secondary | ICD-10-CM | POA: Diagnosis not present

## 2019-07-20 DIAGNOSIS — M0609 Rheumatoid arthritis without rheumatoid factor, multiple sites: Secondary | ICD-10-CM | POA: Diagnosis not present

## 2019-07-20 DIAGNOSIS — R5382 Chronic fatigue, unspecified: Secondary | ICD-10-CM | POA: Diagnosis not present

## 2019-07-20 DIAGNOSIS — M150 Primary generalized (osteo)arthritis: Secondary | ICD-10-CM | POA: Diagnosis not present

## 2019-07-27 DIAGNOSIS — M25531 Pain in right wrist: Secondary | ICD-10-CM | POA: Diagnosis not present

## 2019-07-27 DIAGNOSIS — M0609 Rheumatoid arthritis without rheumatoid factor, multiple sites: Secondary | ICD-10-CM | POA: Diagnosis not present

## 2019-07-27 DIAGNOSIS — Z7952 Long term (current) use of systemic steroids: Secondary | ICD-10-CM | POA: Diagnosis not present

## 2019-07-27 DIAGNOSIS — M150 Primary generalized (osteo)arthritis: Secondary | ICD-10-CM | POA: Diagnosis not present

## 2019-07-27 DIAGNOSIS — M25431 Effusion, right wrist: Secondary | ICD-10-CM | POA: Diagnosis not present

## 2019-07-27 DIAGNOSIS — R5382 Chronic fatigue, unspecified: Secondary | ICD-10-CM | POA: Diagnosis not present

## 2019-07-27 DIAGNOSIS — Z79899 Other long term (current) drug therapy: Secondary | ICD-10-CM | POA: Diagnosis not present

## 2019-07-27 DIAGNOSIS — M65332 Trigger finger, left middle finger: Secondary | ICD-10-CM | POA: Diagnosis not present

## 2019-07-27 DIAGNOSIS — M2550 Pain in unspecified joint: Secondary | ICD-10-CM | POA: Diagnosis not present

## 2019-08-01 ENCOUNTER — Other Ambulatory Visit: Payer: Self-pay | Admitting: Internal Medicine

## 2019-08-01 DIAGNOSIS — I10 Essential (primary) hypertension: Secondary | ICD-10-CM

## 2019-08-01 DIAGNOSIS — E785 Hyperlipidemia, unspecified: Secondary | ICD-10-CM

## 2019-08-07 ENCOUNTER — Other Ambulatory Visit: Payer: Self-pay | Admitting: Internal Medicine

## 2019-08-07 DIAGNOSIS — M05711 Rheumatoid arthritis with rheumatoid factor of right shoulder without organ or systems involvement: Secondary | ICD-10-CM

## 2019-08-07 DIAGNOSIS — M170 Bilateral primary osteoarthritis of knee: Secondary | ICD-10-CM

## 2019-08-07 MED ORDER — TRAMADOL HCL 50 MG PO TABS: 50.0000 mg | ORAL_TABLET | Freq: Two times a day (BID) | ORAL | 1 refills | Status: DC | PRN

## 2019-08-15 ENCOUNTER — Other Ambulatory Visit: Payer: Self-pay

## 2019-08-15 ENCOUNTER — Ambulatory Visit (INDEPENDENT_AMBULATORY_CARE_PROVIDER_SITE_OTHER): Payer: Medicare HMO | Admitting: Internal Medicine

## 2019-08-15 ENCOUNTER — Encounter: Payer: Self-pay | Admitting: Internal Medicine

## 2019-08-15 VITALS — BP 140/74 | HR 60 | Temp 98.3°F | Resp 16 | Ht 64.0 in | Wt 185.5 lb

## 2019-08-15 DIAGNOSIS — H8113 Benign paroxysmal vertigo, bilateral: Secondary | ICD-10-CM | POA: Diagnosis not present

## 2019-08-15 DIAGNOSIS — E785 Hyperlipidemia, unspecified: Secondary | ICD-10-CM

## 2019-08-15 DIAGNOSIS — I10 Essential (primary) hypertension: Secondary | ICD-10-CM | POA: Diagnosis not present

## 2019-08-15 MED ORDER — MECLIZINE HCL 25 MG PO TABS: 25.0000 mg | ORAL_TABLET | Freq: Three times a day (TID) | ORAL | 3 refills | Status: DC | PRN

## 2019-08-15 MED ORDER — ATENOLOL 100 MG PO TABS: 100.0000 mg | ORAL_TABLET | Freq: Every day | ORAL | 1 refills | Status: DC

## 2019-08-15 MED ORDER — AMLODIPINE BESYLATE 10 MG PO TABS: 10.0000 mg | ORAL_TABLET | Freq: Every day | ORAL | 1 refills | Status: DC

## 2019-08-15 MED ORDER — ATORVASTATIN CALCIUM 10 MG PO TABS: 10.0000 mg | ORAL_TABLET | Freq: Every day | ORAL | 1 refills | Status: DC

## 2019-08-15 NOTE — Patient Instructions (Signed)

## 2019-08-15 NOTE — Progress Notes (Signed)
Subjective:  Patient ID: Briana [name], female    DOB: 1931-04-14  Age: 85 y.o. MRN: [id number]  CC: Hypertension and Hyperlipidemia   HPI [name] presents for f/up - She complains of intermittent episodes of vertigo for several years and wants a refill of meclizine.    Outpatient Medications Prior to Visit  Medication Sig Dispense Refill  . acetaminophen (TYLENOL) 650 MG CR tablet Take 650 mg by mouth every 8 (eight) hours as needed for pain.    Marland Kitchen b complex vitamins tablet Take 1 tablet by mouth daily.    . Cholecalciferol (VITAMIN D) 2000 UNITS tablet Take 2,000 Units by mouth daily.    . fexofenadine (ALLEGRA) 180 MG tablet Take 180 mg by mouth daily.    . folic acid (FOLVITE) 1 MG tablet Take 1 mg by mouth daily.    . methotrexate (RHEUMATREX) 2.5 MG tablet Take 20 mg by mouth once a week. Caution:Chemotherapy. Protect from light.  Taken on Fridays.    . traMADol (ULTRAM) 50 MG tablet Take 1 tablet (50 mg total) by mouth every 12 (twelve) hours as needed for moderate pain or severe pain. 180 tablet 1  . amLODipine (NORVASC) 10 MG tablet Take 1 tablet (10 mg total) by mouth daily. 90 tablet 1  . atenolol (TENORMIN) 100 MG tablet Take 1 tablet (100 mg total) by mouth daily. 90 tablet 1  . atorvastatin (LIPITOR) 10 MG tablet TAKE 1 TABLET EVERY DAY 90 tablet 0   No facility-administered medications prior to visit.     ROS Review of Systems  Constitutional: Negative.  Negative for diaphoresis and fatigue.  HENT: Negative.  Negative for ear pain and hearing loss.   Respiratory: Negative.  Negative for cough, chest tightness, shortness of breath and wheezing.   Cardiovascular: Negative for chest pain, palpitations and leg swelling.  Gastrointestinal: Negative for abdominal pain, constipation, diarrhea and nausea.  Endocrine: Negative.   Genitourinary: Negative for difficulty urinating.  Musculoskeletal: Positive for arthralgias. Negative for myalgias.  Skin: Negative.   Negative for color change.  Neurological: Negative.  Negative for dizziness, weakness, light-headedness, numbness and headaches.  Hematological: Negative for adenopathy. Does not bruise/bleed easily.  Psychiatric/Behavioral: Negative.     Objective:  BP 140/74 (BP Location: Left Arm, Patient Position: Sitting, Cuff Size: Large)   Pulse 60   Temp 98.3 F (36.8 C) (Oral)   Resp 16   Ht 5\' 4"  (1.626 m)   Wt 185 lb 8 oz (84.1 kg)   SpO2 97%   BMI 31.84 kg/m   BP Readings from Last 3 Encounters:  08/15/19 140/74  04/28/19 140/70  12/15/18 130/86    Wt Readings from Last 3 Encounters:  08/15/19 185 lb 8 oz (84.1 kg)  04/28/19 180 lb 1.3 oz (81.7 kg)  12/15/18 182 lb 12 oz (82.9 kg)    Physical Exam Vitals signs reviewed.  Constitutional:      Appearance: Normal appearance.  HENT:     Nose: Nose normal.     Mouth/Throat:     Mouth: Mucous membranes are moist.  Eyes:     General: No scleral icterus.    Conjunctiva/sclera: Conjunctivae normal.  Neck:     Musculoskeletal: Neck supple.  Cardiovascular:     Rate and Rhythm: Normal rate and regular rhythm.     Heart sounds: No murmur.  Pulmonary:     Effort: Pulmonary effort is normal.     Breath sounds: No stridor. No wheezing, rhonchi or rales.  Abdominal:     General: Abdomen is flat. Bowel sounds are normal. There is no distension.     Palpations: Abdomen is soft. There is no hepatomegaly or splenomegaly.     Tenderness: There is no abdominal tenderness.  Musculoskeletal: Normal range of motion.     Right lower leg: No edema.     Left lower leg: No edema.  Lymphadenopathy:     Cervical: No cervical adenopathy.  Skin:    General: Skin is warm and dry.  Neurological:     General: No focal deficit present.     Mental Status: She is alert and oriented to person, place, and time. Mental status is at baseline.  Psychiatric:        Mood and Affect: Mood normal.        Behavior: Behavior normal.     Lab Results   Component Value Date   WBC 8.1 12/15/2018   HGB 13.3 12/15/2018   HCT 38.9 12/15/2018   PLT 183.0 12/15/2018   GLUCOSE 118 (H) 12/15/2018   CHOL 193 12/15/2018   TRIG 73.0 12/15/2018   HDL 111.40 12/15/2018   LDLDIRECT 104.5 04/25/2010   LDLCALC 67 12/15/2018   ALT 18 12/15/2018   AST 20 12/15/2018   NA 141 12/15/2018   K 4.3 12/15/2018   CL 103 12/15/2018   CREATININE 0.97 12/15/2018   BUN 17 12/15/2018   CO2 32 12/15/2018   TSH 2.06 12/15/2018   INR 0.92 06/05/2014   HGBA1C 6.2 12/02/2017    Ct Head Wo Contrast  Result Date: 04/28/2019 CLINICAL DATA:  Headache and dizziness.  Fall 1 day prior. EXAM: CT HEAD WITHOUT CONTRAST TECHNIQUE: Contiguous axial images were obtained from the base of the skull through the vertex without intravenous contrast. COMPARISON:  Feb 27, 2017 FINDINGS: Brain: Mild diffuse atrophy with somewhat greater parietal lobe atrophy bilaterally is stable. There are small chronic subdural hygromas on each side with stable impression on each frontal lobe. There is no intra-axial mass. There is no acute hemorrhage evident on this study. No midline shift. No acute appearing extra-axial fluid collection. There is small vessel disease in the centra semiovale bilaterally. Tiny lacunar infarcts in the right lentiform nucleus are stable. No acute infarct is demonstrable. Vascular: No hyperdense vessel. There is calcification in each distal vertebral artery and carotid siphon region. Skull: The bony calvarium appears intact. Sinuses/Orbits: Paranasal sinuses are clear. Orbits appear symmetric bilaterally. Other: Mastoid air cells are clear. IMPRESSION: Mild diffuse atrophy with somewhat more atrophy in the parietal lobes bilaterally than elsewhere, stable. Small bilateral subdural hygromas, stable. No new extra-axial fluid. No mass or hemorrhage. There is patchy supratentorial small vessel disease. No acute infarct evident. There are foci of arterial vascular calcification  bilaterally. Electronically Signed   By: Lowella Grip III M.D.   On: 04/28/2019 13:21    Assessment & Plan:   Delynda was seen today for hypertension and hyperlipidemia.  Diagnoses and all orders for this visit:  Vertigo, benign paroxysmal, bilateral- She has chronic vertigo with no other neurological complaints.  This is consistent with BPPV.  I recommended that she take meclizine as needed. -     meclizine (MEDI-MECLIZINE) 25 MG tablet; Take 1 tablet (25 mg total) by mouth 3 (three) times daily as needed for dizziness.  Essential hypertension, benign - Her blood pressure is well controlled on the combination of atenolol and amlodipine. -     atenolol (TENORMIN) 100 MG tablet; Take 1 tablet (100 mg  total) by mouth daily. -     amLODipine (NORVASC) 10 MG tablet; Take 1 tablet (10 mg total) by mouth daily.  Hyperlipidemia with target LDL less than 130- She has achieved her LDL goal and is doing well on the statin. -     atorvastatin (LIPITOR) 10 MG tablet; Take 1 tablet (10 mg total) by mouth daily.   I have changed Azara M. Fels's atorvastatin. I am also having her maintain her folic acid, methotrexate, Vitamin D, fexofenadine, b complex vitamins, acetaminophen, traMADol, meclizine, atenolol, and amLODipine.  Meds ordered this encounter  Medications  . meclizine (MEDI-MECLIZINE) 25 MG tablet    Sig: Take 1 tablet (25 mg total) by mouth 3 (three) times daily as needed for dizziness.    Dispense:  90 tablet    Refill:  3  . atenolol (TENORMIN) 100 MG tablet    Sig: Take 1 tablet (100 mg total) by mouth daily.    Dispense:  90 tablet    Refill:  1  . atorvastatin (LIPITOR) 10 MG tablet    Sig: Take 1 tablet (10 mg total) by mouth daily.    Dispense:  90 tablet    Refill:  1  . amLODipine (NORVASC) 10 MG tablet    Sig: Take 1 tablet (10 mg total) by mouth daily.    Dispense:  90 tablet    Refill:  1     Follow-up: Return in about 6 months (around 02/12/2020).  Scarlette Calico, MD

## 2019-08-21 DIAGNOSIS — Z79899 Other long term (current) drug therapy: Secondary | ICD-10-CM | POA: Diagnosis not present

## 2019-08-21 DIAGNOSIS — M0609 Rheumatoid arthritis without rheumatoid factor, multiple sites: Secondary | ICD-10-CM | POA: Diagnosis not present

## 2019-08-21 DIAGNOSIS — M25431 Effusion, right wrist: Secondary | ICD-10-CM | POA: Diagnosis not present

## 2019-09-26 DIAGNOSIS — H10413 Chronic giant papillary conjunctivitis, bilateral: Secondary | ICD-10-CM | POA: Diagnosis not present

## 2019-09-26 DIAGNOSIS — Z961 Presence of intraocular lens: Secondary | ICD-10-CM | POA: Diagnosis not present

## 2019-09-26 DIAGNOSIS — H43813 Vitreous degeneration, bilateral: Secondary | ICD-10-CM | POA: Diagnosis not present

## 2019-09-26 DIAGNOSIS — H26493 Other secondary cataract, bilateral: Secondary | ICD-10-CM | POA: Diagnosis not present

## 2019-09-26 DIAGNOSIS — H04123 Dry eye syndrome of bilateral lacrimal glands: Secondary | ICD-10-CM | POA: Diagnosis not present

## 2019-10-26 DIAGNOSIS — Z79899 Other long term (current) drug therapy: Secondary | ICD-10-CM | POA: Diagnosis not present

## 2019-10-26 DIAGNOSIS — M0609 Rheumatoid arthritis without rheumatoid factor, multiple sites: Secondary | ICD-10-CM | POA: Diagnosis not present

## 2019-12-26 ENCOUNTER — Other Ambulatory Visit: Payer: Self-pay

## 2019-12-26 ENCOUNTER — Encounter: Payer: Self-pay | Admitting: Internal Medicine

## 2019-12-26 ENCOUNTER — Ambulatory Visit (INDEPENDENT_AMBULATORY_CARE_PROVIDER_SITE_OTHER): Payer: Medicare HMO | Admitting: Internal Medicine

## 2019-12-26 VITALS — BP 138/76 | HR 64 | Temp 98.0°F | Resp 16 | Ht 64.0 in | Wt 188.0 lb

## 2019-12-26 DIAGNOSIS — R7309 Other abnormal glucose: Secondary | ICD-10-CM | POA: Diagnosis not present

## 2019-12-26 DIAGNOSIS — E785 Hyperlipidemia, unspecified: Secondary | ICD-10-CM | POA: Diagnosis not present

## 2019-12-26 DIAGNOSIS — E118 Type 2 diabetes mellitus with unspecified complications: Secondary | ICD-10-CM | POA: Diagnosis not present

## 2019-12-26 DIAGNOSIS — I10 Essential (primary) hypertension: Secondary | ICD-10-CM | POA: Diagnosis not present

## 2019-12-26 DIAGNOSIS — Z0000 Encounter for general adult medical examination without abnormal findings: Secondary | ICD-10-CM

## 2019-12-26 LAB — HEPATIC FUNCTION PANEL
ALT: 21 U/L (ref 0–35)
AST: 22 U/L (ref 0–37)
Albumin: 4 g/dL (ref 3.5–5.2)
Alkaline Phosphatase: 109 U/L (ref 39–117)
Bilirubin, Direct: 0.2 mg/dL (ref 0.0–0.3)
Total Bilirubin: 0.7 mg/dL (ref 0.2–1.2)
Total Protein: 7.4 g/dL (ref 6.0–8.3)

## 2019-12-26 LAB — LIPID PANEL
Cholesterol: 191 mg/dL (ref 0–200)
HDL: 97.6 mg/dL (ref >39.00)
LDL Cholesterol: 74 mg/dL (ref 0–99)
NonHDL: 93.01
Total CHOL/HDL Ratio: 2
Triglycerides: 95 mg/dL (ref 0.0–149.0)
VLDL: 19 mg/dL (ref 0.0–40.0)

## 2019-12-26 LAB — CBC WITH DIFFERENTIAL/PLATELET
Basophils Absolute: 0.1 10*3/uL (ref 0.0–0.1)
Basophils Relative: 0.8 % (ref 0.0–3.0)
Eosinophils Absolute: 0.1 10*3/uL (ref 0.0–0.7)
Eosinophils Relative: 1.5 % (ref 0.0–5.0)
HCT: 40.8 % (ref 36.0–46.0)
Hemoglobin: 13.5 g/dL (ref 12.0–15.0)
Lymphocytes Relative: 19.4 % (ref 12.0–46.0)
Lymphs Abs: 1.7 10*3/uL (ref 0.7–4.0)
MCHC: 33.2 g/dL (ref 30.0–36.0)
MCV: 97.6 fl (ref 78.0–100.0)
Monocytes Absolute: 0.5 10*3/uL (ref 0.1–1.0)
Monocytes Relative: 6.2 % (ref 3.0–12.0)
Neutro Abs: 6.4 10*3/uL (ref 1.4–7.7)
Neutrophils Relative %: 72.1 % (ref 43.0–77.0)
Platelets: 202 10*3/uL (ref 150.0–400.0)
RBC: 4.18 Mil/uL (ref 3.87–5.11)
RDW: 14.8 % (ref 11.5–15.5)
WBC: 8.9 10*3/uL (ref 4.0–10.5)

## 2019-12-26 LAB — BASIC METABOLIC PANEL
BUN: 21 mg/dL (ref 6–23)
CO2: 30 mEq/L (ref 19–32)
Calcium: 9.5 mg/dL (ref 8.4–10.5)
Chloride: 103 mEq/L (ref 96–112)
Creatinine, Ser: 1.01 mg/dL (ref 0.40–1.20)
GFR: 62.84 mL/min (ref >60.00)
Glucose, Bld: 133 mg/dL — ABNORMAL HIGH (ref 70–99)
Potassium: 3.9 mEq/L (ref 3.5–5.1)
Sodium: 140 mEq/L (ref 135–145)

## 2019-12-26 LAB — TSH: TSH: 2.66 u[IU]/mL (ref 0.35–4.50)

## 2019-12-26 LAB — HEMOGLOBIN A1C: Hgb A1c MFr Bld: 6.6 % — ABNORMAL HIGH (ref 4.6–6.5)

## 2019-12-26 NOTE — Progress Notes (Signed)
Subjective:  Patient ID: Briana [name], female    DOB: 1931-09-11  Age: 86 y.o. MRN: [id number]  CC: Annual Exam, Hypertension, Hyperlipidemia, and Osteoarthritis  This visit occurred during the SARS-CoV-2 public health emergency.  Safety protocols were in place, including screening questions prior to the visit, additional usage of staff PPE, and extensive cleaning of exam room while observing appropriate contact time as indicated for disinfecting solutions.    HPI Briana [name] presents for a CPX.  She continues to complain of pain in her large joints but says she is getting adequate symptom relief with Tylenol and tramadol.  She is not very active but denies any recent episodes of presyncope, dizziness, lightheadedness, chest pain, shortness of breath, palpitations, edema, or fatigue.   Outpatient Medications Prior to Visit  Medication Sig Dispense Refill  . acetaminophen (TYLENOL) 650 MG CR tablet Take 650 mg by mouth every 8 (eight) hours as needed for pain.    Marland Kitchen amLODipine (NORVASC) 10 MG tablet Take 1 tablet (10 mg total) by mouth daily. 90 tablet 1  . atenolol (TENORMIN) 100 MG tablet Take 1 tablet (100 mg total) by mouth daily. 90 tablet 1  . atorvastatin (LIPITOR) 10 MG tablet Take 1 tablet (10 mg total) by mouth daily. 90 tablet 1  . b complex vitamins tablet Take 1 tablet by mouth daily.    . Cholecalciferol (VITAMIN D) 2000 UNITS tablet Take 2,000 Units by mouth daily.    . fexofenadine (ALLEGRA) 180 MG tablet Take 180 mg by mouth daily.    . folic acid (FOLVITE) 1 MG tablet Take 1 mg by mouth daily.    . meclizine (MEDI-MECLIZINE) 25 MG tablet Take 1 tablet (25 mg total) by mouth 3 (three) times daily as needed for dizziness. 90 tablet 3  . methotrexate (RHEUMATREX) 2.5 MG tablet Take 20 mg by mouth once a week. Caution:Chemotherapy. Protect from light.  Taken on Fridays.    . traMADol (ULTRAM) 50 MG tablet Take 1 tablet (50 mg total) by mouth every 12 (twelve) hours as  needed for moderate pain or severe pain. 180 tablet 1   No facility-administered medications prior to visit.    ROS Review of Systems  Constitutional: Negative.  Negative for chills, diaphoresis, fatigue and fever.  HENT: Negative.  Negative for sore throat, trouble swallowing and voice change.   Eyes: Negative.   Respiratory: Negative for cough, chest tightness, shortness of breath and wheezing.   Cardiovascular: Negative for chest pain, palpitations and leg swelling.  Gastrointestinal: Negative for abdominal pain, constipation, diarrhea and nausea.  Endocrine: Negative.   Genitourinary: Negative.  Negative for decreased urine volume, difficulty urinating, dysuria, hematuria, urgency, vaginal bleeding and vaginal discharge.  Musculoskeletal: Positive for arthralgias. Negative for gait problem and neck pain.  Skin: Negative.  Negative for color change, pallor and rash.  Neurological: Negative.   Hematological: Negative for adenopathy. Does not bruise/bleed easily.  Psychiatric/Behavioral: Negative.     Objective:  BP 138/76 (BP Location: Left Arm, Patient Position: Sitting, Cuff Size: Large)   Pulse 64   Temp 98 F (36.7 C) (Oral)   Resp 16   Ht 5\' 4"  (1.626 m)   Wt 188 lb (85.3 kg)   SpO2 98%   BMI 32.27 kg/m   BP Readings from Last 3 Encounters:  12/26/19 138/76  08/15/19 140/74  04/28/19 140/70    Wt Readings from Last 3 Encounters:  12/26/19 188 lb (85.3 kg)  08/15/19 185 lb 8 oz (84.1  kg)  04/28/19 180 lb 1.3 oz (81.7 kg)    Physical Exam Vitals reviewed.  Constitutional:      Appearance: Normal appearance.  HENT:     Nose: Nose normal.     Mouth/Throat:     Mouth: Mucous membranes are moist.  Eyes:     General: No scleral icterus.    Conjunctiva/sclera: Conjunctivae normal.  Neck:     Thyroid: No thyroid mass, thyromegaly or thyroid tenderness.  Cardiovascular:     Rate and Rhythm: Regular rhythm. Bradycardia present.     Pulses: Normal pulses.      Heart sounds: No murmur.     Comments: EKG - Sinus bradycardia No LVH O/w normal EKG Pulmonary:     Effort: Pulmonary effort is normal.     Breath sounds: No stridor. No wheezing, rhonchi or rales.  Abdominal:     General: Abdomen is flat.     Palpations: There is no mass.     Tenderness: There is no abdominal tenderness. There is no guarding.  Musculoskeletal:        General: Normal range of motion.     Right lower leg: No edema.     Left lower leg: No edema.  Lymphadenopathy:     Head:     Right side of head: No preauricular, posterior auricular or occipital adenopathy.     Left side of head: No preauricular, posterior auricular or occipital adenopathy.     Cervical: No cervical adenopathy.     Right cervical: No deep or posterior cervical adenopathy.    Left cervical: No deep or posterior cervical adenopathy.     Upper Body:     Right upper body: No supraclavicular, axillary, pectoral or epitrochlear adenopathy.     Left upper body: No supraclavicular, axillary, pectoral or epitrochlear adenopathy.     Lower Body: No right inguinal adenopathy. No left inguinal adenopathy.  Skin:    General: Skin is warm and dry.  Neurological:     General: No focal deficit present.     Mental Status: She is alert.  Psychiatric:        Mood and Affect: Mood normal.        Behavior: Behavior normal.     Lab Results  Component Value Date   WBC 8.9 12/26/2019   HGB 13.5 12/26/2019   HCT 40.8 12/26/2019   PLT 202.0 12/26/2019   GLUCOSE 133 (H) 12/26/2019   CHOL 191 12/26/2019   TRIG 95.0 12/26/2019   HDL 97.60 12/26/2019   LDLDIRECT 104.5 04/25/2010   LDLCALC 74 12/26/2019   ALT 21 12/26/2019   AST 22 12/26/2019   NA 140 12/26/2019   K 3.9 12/26/2019   CL 103 12/26/2019   CREATININE 1.01 12/26/2019   BUN 21 12/26/2019   CO2 30 12/26/2019   TSH 2.66 12/26/2019   INR 0.92 06/05/2014   HGBA1C 6.6 (H) 12/26/2019    CT Head Wo Contrast  Result Date: 04/28/2019 CLINICAL DATA:   Headache and dizziness.  Fall 1 day prior. EXAM: CT HEAD WITHOUT CONTRAST TECHNIQUE: Contiguous axial images were obtained from the base of the skull through the vertex without intravenous contrast. COMPARISON:  Feb 27, 2017 FINDINGS: Brain: Mild diffuse atrophy with somewhat greater parietal lobe atrophy bilaterally is stable. There are small chronic subdural hygromas on each side with stable impression on each frontal lobe. There is no intra-axial mass. There is no acute hemorrhage evident on this study. No midline shift. No acute appearing extra-axial  fluid collection. There is small vessel disease in the centra semiovale bilaterally. Tiny lacunar infarcts in the right lentiform nucleus are stable. No acute infarct is demonstrable. Vascular: No hyperdense vessel. There is calcification in each distal vertebral artery and carotid siphon region. Skull: The bony calvarium appears intact. Sinuses/Orbits: Paranasal sinuses are clear. Orbits appear symmetric bilaterally. Other: Mastoid air cells are clear. IMPRESSION: Mild diffuse atrophy with somewhat more atrophy in the parietal lobes bilaterally than elsewhere, stable. Small bilateral subdural hygromas, stable. No new extra-axial fluid. No mass or hemorrhage. There is patchy supratentorial small vessel disease. No acute infarct evident. There are foci of arterial vascular calcification bilaterally. Electronically Signed   By: Lowella Grip III M.D.   On: 04/28/2019 13:21    Assessment & Plan:   Kanna was seen today for annual exam, hypertension, hyperlipidemia and osteoarthritis.  Diagnoses and all orders for this visit:  Essential hypertension, benign- Her blood pressure is adequately well controlled.  Electrolytes and renal function are normal.  Her EKG is remarkable for bradycardia which she is tolerating well.  Will continue the combination of atenolol and amlodipine. -     CBC with Differential/Platelet -     TSH -     EKG 12-Lead  Other  abnormal glucose- Her A1c is up to 6.6%.  She has developed type 2 diabetes mellitus.  Medical therapy is not indicated. -     Basic metabolic panel -     Hemoglobin A1c  Routine health maintenance- Exam completed, labs reviewed, vaccines reviewed, no cancer screenings are indicated, patient education was given.  Hyperlipidemia with target LDL less than 130- She has achieved her LDL goal and is doing well on the statin. -     Lipid panel -     TSH -     Hepatic function panel   I am having Coby M. Kolton maintain her folic acid, methotrexate, Vitamin D, fexofenadine, b complex vitamins, acetaminophen, traMADol, meclizine, atenolol, atorvastatin, and amLODipine.  No orders of the defined types were placed in this encounter.    Follow-up: Return in about 6 months (around 06/27/2020).  Scarlette Calico, MD

## 2019-12-26 NOTE — Patient Instructions (Signed)
Health Maintenance, Female Adopting a healthy lifestyle and getting preventive care are important in promoting health and wellness. Ask your health care provider about:  The right schedule for you to have regular tests and exams.  Things you can do on your own to prevent diseases and keep yourself healthy. What should I know about diet, weight, and exercise? Eat a healthy diet   Eat a diet that includes plenty of vegetables, fruits, low-fat dairy products, and lean protein.  Do not eat a lot of foods that are high in solid fats, added sugars, or sodium. Maintain a healthy weight Body mass index (BMI) is used to identify weight problems. It estimates body fat based on height and weight. Your health care provider can help determine your BMI and help you achieve or maintain a healthy weight. Get regular exercise Get regular exercise. This is one of the most important things you can do for your health. Most adults should:  Exercise for at least 150 minutes each week. The exercise should increase your heart rate and make you sweat (moderate-intensity exercise).  Do strengthening exercises at least twice a week. This is in addition to the moderate-intensity exercise.  Spend less time sitting. Even light physical activity can be beneficial. Watch cholesterol and blood lipids Have your blood tested for lipids and cholesterol at 84 years of age, then have this test every 5 years. Have your cholesterol levels checked more often if:  Your lipid or cholesterol levels are high.  You are older than 84 years of age.  You are at high risk for heart disease. What should I know about cancer screening? Depending on your health history and family history, you may need to have cancer screening at various ages. This may include screening for:  Breast cancer.  Cervical cancer.  Colorectal cancer.  Skin cancer.  Lung cancer. What should I know about heart disease, diabetes, and high blood  pressure? Blood pressure and heart disease  High blood pressure causes heart disease and increases the risk of stroke. This is more likely to develop in people who have high blood pressure readings, are of African descent, or are overweight.  Have your blood pressure checked: ? Every 3-5 years if you are 18-39 years of age. ? Every year if you are 40 years old or older. Diabetes Have regular diabetes screenings. This checks your fasting blood sugar level. Have the screening done:  Once every three years after age 40 if you are at a normal weight and have a low risk for diabetes.  More often and at a younger age if you are overweight or have a high risk for diabetes. What should I know about preventing infection? Hepatitis B If you have a higher risk for hepatitis B, you should be screened for this virus. Talk with your health care provider to find out if you are at risk for hepatitis B infection. Hepatitis C Testing is recommended for:  Everyone born from 1945 through 1965.  Anyone with known risk factors for hepatitis C. Sexually transmitted infections (STIs)  Get screened for STIs, including gonorrhea and chlamydia, if: ? You are sexually active and are younger than 84 years of age. ? You are older than 84 years of age and your health care provider tells you that you are at risk for this type of infection. ? Your sexual activity has changed since you were last screened, and you are at increased risk for chlamydia or gonorrhea. Ask your health care provider if   you are at risk.  Ask your health care provider about whether you are at high risk for HIV. Your health care provider may recommend a prescription medicine to help prevent HIV infection. If you choose to take medicine to prevent HIV, you should first get tested for HIV. You should then be tested every 3 months for as long as you are taking the medicine. Pregnancy  If you are about to stop having your period (premenopausal) and  you may become pregnant, seek counseling before you get pregnant.  Take 400 to 800 micrograms (mcg) of folic acid every day if you become pregnant.  Ask for birth control (contraception) if you want to prevent pregnancy. Osteoporosis and menopause Osteoporosis is a disease in which the bones lose minerals and strength with aging. This can result in bone fractures. If you are 65 years old or older, or if you are at risk for osteoporosis and fractures, ask your health care provider if you should:  Be screened for bone loss.  Take a calcium or vitamin D supplement to lower your risk of fractures.  Be given hormone replacement therapy (HRT) to treat symptoms of menopause. Follow these instructions at home: Lifestyle  Do not use any products that contain nicotine or tobacco, such as cigarettes, e-cigarettes, and chewing tobacco. If you need help quitting, ask your health care provider.  Do not use street drugs.  Do not share needles.  Ask your health care provider for help if you need support or information about quitting drugs. Alcohol use  Do not drink alcohol if: ? Your health care provider tells you not to drink. ? You are pregnant, may be pregnant, or are planning to become pregnant.  If you drink alcohol: ? Limit how much you use to 0-1 drink a day. ? Limit intake if you are breastfeeding.  Be aware of how much alcohol is in your drink. In the [place] one drink equals one 12 oz bottle of beer (355 mL), one 5 oz glass of wine (148 mL), or one 1 oz glass of hard liquor (44 mL). General instructions  Schedule regular health, dental, and eye exams.  Stay current with your vaccines.  Tell your health care provider if: ? You often feel depressed. ? You have ever been abused or do not feel safe at home. Summary  Adopting a healthy lifestyle and getting preventive care are important in promoting health and wellness.  Follow your health care provider's instructions about healthy  diet, exercising, and getting tested or screened for diseases.  Follow your health care provider's instructions on monitoring your cholesterol and blood pressure. This information is not intended to replace advice given to you by your health care provider. Make sure you discuss any questions you have with your health care provider. Document Revised: 09/21/2018 Document Reviewed: 09/21/2018 Elsevier Patient Education  2020 [employer]  

## 2019-12-28 DIAGNOSIS — E118 Type 2 diabetes mellitus with unspecified complications: Secondary | ICD-10-CM | POA: Insufficient documentation

## 2019-12-29 ENCOUNTER — Telehealth: Payer: Self-pay | Admitting: Internal Medicine

## 2019-12-29 NOTE — Telephone Encounter (Signed)
    Please call to review lab results

## 2019-12-29 NOTE — Telephone Encounter (Signed)
Pt contacted and informed of results.  

## 2020-01-18 DIAGNOSIS — M150 Primary generalized (osteo)arthritis: Secondary | ICD-10-CM | POA: Diagnosis not present

## 2020-01-18 DIAGNOSIS — Z79899 Other long term (current) drug therapy: Secondary | ICD-10-CM | POA: Diagnosis not present

## 2020-01-18 DIAGNOSIS — M0609 Rheumatoid arthritis without rheumatoid factor, multiple sites: Secondary | ICD-10-CM | POA: Diagnosis not present

## 2020-01-18 DIAGNOSIS — Z6832 Body mass index (BMI) 32.0-32.9, adult: Secondary | ICD-10-CM | POA: Diagnosis not present

## 2020-01-18 DIAGNOSIS — M2550 Pain in unspecified joint: Secondary | ICD-10-CM | POA: Diagnosis not present

## 2020-01-18 DIAGNOSIS — M65332 Trigger finger, left middle finger: Secondary | ICD-10-CM | POA: Diagnosis not present

## 2020-01-18 DIAGNOSIS — E669 Obesity, unspecified: Secondary | ICD-10-CM | POA: Diagnosis not present

## 2020-01-19 ENCOUNTER — Other Ambulatory Visit: Payer: Self-pay | Admitting: Internal Medicine

## 2020-01-19 DIAGNOSIS — I10 Essential (primary) hypertension: Secondary | ICD-10-CM

## 2020-01-19 DIAGNOSIS — E785 Hyperlipidemia, unspecified: Secondary | ICD-10-CM

## 2020-02-26 DIAGNOSIS — E669 Obesity, unspecified: Secondary | ICD-10-CM | POA: Diagnosis not present

## 2020-02-26 DIAGNOSIS — M150 Primary generalized (osteo)arthritis: Secondary | ICD-10-CM | POA: Diagnosis not present

## 2020-02-26 DIAGNOSIS — Z79899 Other long term (current) drug therapy: Secondary | ICD-10-CM | POA: Diagnosis not present

## 2020-02-26 DIAGNOSIS — Z6831 Body mass index (BMI) 31.0-31.9, adult: Secondary | ICD-10-CM | POA: Diagnosis not present

## 2020-02-26 DIAGNOSIS — M25531 Pain in right wrist: Secondary | ICD-10-CM | POA: Diagnosis not present

## 2020-02-26 DIAGNOSIS — M0609 Rheumatoid arthritis without rheumatoid factor, multiple sites: Secondary | ICD-10-CM | POA: Diagnosis not present

## 2020-02-26 DIAGNOSIS — M2550 Pain in unspecified joint: Secondary | ICD-10-CM | POA: Diagnosis not present

## 2020-02-26 DIAGNOSIS — M65332 Trigger finger, left middle finger: Secondary | ICD-10-CM | POA: Diagnosis not present

## 2020-02-29 ENCOUNTER — Ambulatory Visit (INDEPENDENT_AMBULATORY_CARE_PROVIDER_SITE_OTHER): Payer: Medicare HMO | Admitting: Internal Medicine

## 2020-02-29 ENCOUNTER — Encounter: Payer: Self-pay | Admitting: Internal Medicine

## 2020-02-29 ENCOUNTER — Other Ambulatory Visit: Payer: Self-pay

## 2020-02-29 VITALS — BP 160/62 | HR 59 | Temp 98.3°F | Resp 16 | Ht 64.0 in | Wt 183.0 lb

## 2020-02-29 DIAGNOSIS — B370 Candidal stomatitis: Secondary | ICD-10-CM | POA: Diagnosis not present

## 2020-02-29 MED ORDER — CLOTRIMAZOLE 10 MG MT TROC: 10.0000 mg | Freq: Three times a day (TID) | OROMUCOSAL | 0 refills | Status: AC

## 2020-02-29 NOTE — Patient Instructions (Signed)
Oral Thrush, Adult  Oral thrush, also called oral candidiasis, is a fungal infection that develops in the mouth and throat and on the tongue. It causes white patches to form on the mouth and tongue. Thrush is most common in older adults, but it can occur at any age. Many cases of thrush are mild, but this infection can also be serious. Thrush can be a repeated (recurrent) problem for certain people who have a weak body defense system (immune system). The weakness can be caused by chronic illnesses, or by taking medicines that limit the body's ability to fight infection. If a person has difficulty fighting infection, the fungus that causes thrush can spread through the body. This can cause life-threatening blood or organ infections. What are the causes? This condition is caused by a fungus (yeast) called Candida albicans.  This fungus is normally present in small amounts in the mouth and on other mucous membranes. It usually causes no harm.  If conditions are present that allow the fungus to grow without control, it invades surrounding tissues and becomes an infection.  Other Candida species can also lead to thrush (rare). What increases the risk? This condition is more likely to develop in:  People with a weakened immune system.  Older adults.  People with HIV (human immunodeficiency virus).  People with diabetes.  People with dry mouth (xerostomia).  Pregnant women.  People with poor dental care, especially people who have false teeth.  People who use antibiotic medicines. What are the signs or symptoms? Symptoms of this condition can vary from mild and moderate to severe and persistent. Symptoms may include:  A burning feeling in the mouth and throat. This can occur at the start of a thrush infection.  White patches that stick to the mouth and tongue. The tissue around the patches may be red, raw, and painful. If rubbed (during tooth brushing, for example), the patches and the  tissue of the mouth may bleed easily.  A bad taste in the mouth or difficulty tasting foods.  A cottony feeling in the mouth.  Pain during eating and swallowing.  Poor appetite.  Cracking at the corners of the mouth. How is this diagnosed? This condition is diagnosed based on:  Physical exam. Your health care provider will look in your mouth.  Health history. Your health care provider will ask you questions about your health. How is this treated? This condition is treated with medicines called antifungals, which prevent the growth of fungi. These medicines are either applied directly to the affected area (topical) or swallowed (oral). The treatment will depend on the severity of the condition. Mild thrush Mild cases of thrush may clear up with the use of an antifungal mouth rinse or lozenges. Treatment usually lasts about 14 days. Moderate to severe thrush  More severe thrush infections that have spread to the esophagus are treated with an oral antifungal medicine. A topical antifungal medicine may also be used.  For some severe infections, treatment may need to continue for more than 14 days.  Oral antifungal medicines are rarely used during pregnancy because they may be harmful to the unborn child. If you are pregnant, talk with your health care provider about options for treatment. Persistent or recurrent thrush For cases of thrush that do not go away or keep coming back:  Treatment may be needed twice as long as the symptoms last.  Treatment will include both oral and topical antifungal medicines.  People with a weakened immune system can take   an antifungal medicine on a continuous basis to prevent thrush infections. It is important to treat conditions that make a person more likely to get thrush, such as diabetes or HIV. Follow these instructions at home: Medicines  Take over-the-counter and prescription medicines only as told by your health care provider.  Talk with  your health care provider about an over-the-counter medicine called gentian violet, which kills bacteria and fungi. Relieving soreness and discomfort To help reduce the discomfort of thrush:  Drink cold liquids such as water or iced tea.  Try flavored ice treats or frozen juices.  Eat foods that are easy to swallow, such as gelatin, ice cream, or custard.  Try drinking from a straw if the patches in your mouth are painful.  General instructions  Eat plain, unflavored yogurt as directed by your health care provider. Check the label to make sure the yogurt contains live cultures. This yogurt can help healthy bacteria to grow in the mouth and can stop the growth of the fungus that causes thrush.  If you wear dentures, remove the dentures before going to bed, brush them vigorously, and soak them in a cleaning solution as directed by your health care provider.  Rinse your mouth with a warm salt-water mixture several times a day. To make a salt-water mixture, completely dissolve 1/2-1 tsp of salt in 1 cup of warm water. Contact a health care provider if:  Your symptoms are getting worse or are not improving within 7 days of starting treatment.  You have symptoms of a spreading infection, such as white patches on the skin outside of the mouth. This information is not intended to replace advice given to you by your health care provider. Make sure you discuss any questions you have with your health care provider. Document Revised: 12/31/2017 Document Reviewed: 06/22/2016 Elsevier Patient Education  2020 [employer]  

## 2020-02-29 NOTE — Progress Notes (Signed)
Subjective:  Patient ID: Briana [name], female    DOB: 1931/08/31  Age: 86 y.o. MRN: [id number]  CC: Mouth Lesions  This visit occurred during the SARS-CoV-2 public health emergency.  Safety protocols were in place, including screening questions prior to the visit, additional usage of staff PPE, and extensive cleaning of exam room while observing appropriate contact time as indicated for disinfecting solutions.    HPI Briana [name] presents for a 1 week hx of tongue soreness and white coating.  Outpatient Medications Prior to Visit  Medication Sig Dispense Refill  . acetaminophen (TYLENOL) 650 MG CR tablet Take 650 mg by mouth every 8 (eight) hours as needed for pain.    Marland Kitchen amLODipine (NORVASC) 10 MG tablet TAKE 1 TABLET EVERY DAY 90 tablet 1  . atenolol (TENORMIN) 100 MG tablet TAKE 1 TABLET EVERY DAY 90 tablet 1  . atorvastatin (LIPITOR) 10 MG tablet TAKE 1 TABLET EVERY DAY 90 tablet 1  . b complex vitamins tablet Take 1 tablet by mouth daily.    . Cholecalciferol (VITAMIN D) 2000 UNITS tablet Take 2,000 Units by mouth daily.    . fexofenadine (ALLEGRA) 180 MG tablet Take 180 mg by mouth daily.    . folic acid (FOLVITE) 1 MG tablet Take 1 mg by mouth daily.    . meclizine (MEDI-MECLIZINE) 25 MG tablet Take 1 tablet (25 mg total) by mouth 3 (three) times daily as needed for dizziness. 90 tablet 3  . methotrexate (RHEUMATREX) 2.5 MG tablet Take 20 mg by mouth once a week. Caution:Chemotherapy. Protect from light.  Taken on Fridays.    . traMADol (ULTRAM) 50 MG tablet Take 1 tablet (50 mg total) by mouth every 12 (twelve) hours as needed for moderate pain or severe pain. 180 tablet 1   No facility-administered medications prior to visit.    ROS Review of Systems  Constitutional: Negative.  Negative for chills, diaphoresis, fatigue and fever.  HENT: Negative.  Negative for facial swelling, mouth sores, sore throat and trouble swallowing.   Eyes: Negative for visual  disturbance.  Respiratory: Negative for cough, chest tightness, shortness of breath and wheezing.   Cardiovascular: Negative for chest pain, palpitations and leg swelling.  Gastrointestinal: Negative for abdominal pain, constipation, diarrhea, nausea and vomiting.  Endocrine: Negative.   Genitourinary: Negative.  Negative for difficulty urinating and dysuria.  Musculoskeletal: Negative.   Skin: Negative.  Negative for rash.  Neurological: Negative.  Negative for dizziness, weakness and light-headedness.  Hematological: Negative for adenopathy. Does not bruise/bleed easily.  Psychiatric/Behavioral: Negative.     Objective:  BP (!) 160/62 (BP Location: Left Arm, Patient Position: Sitting, Cuff Size: Large)   Pulse (!) 59   Temp 98.3 F (36.8 C) (Oral)   Resp 16   Ht 5\' 4"  (1.626 m)   Wt 183 lb (83 kg)   SpO2 97%   BMI 31.41 kg/m   BP Readings from Last 3 Encounters:  02/29/20 (!) 160/62  12/26/19 138/76  08/15/19 140/74    Wt Readings from Last 3 Encounters:  02/29/20 183 lb (83 kg)  12/26/19 188 lb (85.3 kg)  08/15/19 185 lb 8 oz (84.1 kg)    Physical Exam Vitals reviewed.  Constitutional:      Appearance: Normal appearance.  HENT:     Nose: Nose normal.     Mouth/Throat:     Mouth: Mucous membranes are moist.     Pharynx: Oropharynx is clear. No oropharyngeal exudate.  Tonsils: No tonsillar exudate or tonsillar abscesses.     Comments: Tongue is hairy and has a white coat, over the posterior tongue there is some white exudate that easily wipes off. Cardiovascular:     Rate and Rhythm: Normal rate and regular rhythm.     Heart sounds: No murmur.  Pulmonary:     Effort: Pulmonary effort is normal.     Breath sounds: No stridor. No wheezing, rhonchi or rales.  Abdominal:     General: Abdomen is flat. Bowel sounds are normal.     Palpations: There is no hepatomegaly, splenomegaly or mass.     Tenderness: There is no abdominal tenderness.  Musculoskeletal:          General: Normal range of motion.     Cervical back: Neck supple.     Right lower leg: No edema.     Left lower leg: No edema.  Lymphadenopathy:     Cervical: No cervical adenopathy.  Skin:    General: Skin is warm and dry.     Coloration: Skin is not pale.  Neurological:     General: No focal deficit present.     Mental Status: She is alert.  Psychiatric:        Mood and Affect: Mood normal.        Behavior: Behavior normal.     Lab Results  Component Value Date   WBC 8.9 12/26/2019   HGB 13.5 12/26/2019   HCT 40.8 12/26/2019   PLT 202.0 12/26/2019   GLUCOSE 133 (H) 12/26/2019   CHOL 191 12/26/2019   TRIG 95.0 12/26/2019   HDL 97.60 12/26/2019   LDLDIRECT 104.5 04/25/2010   LDLCALC 74 12/26/2019   ALT 21 12/26/2019   AST 22 12/26/2019   NA 140 12/26/2019   K 3.9 12/26/2019   CL 103 12/26/2019   CREATININE 1.01 12/26/2019   BUN 21 12/26/2019   CO2 30 12/26/2019   TSH 2.66 12/26/2019   INR 0.92 06/05/2014   HGBA1C 6.6 (H) 12/26/2019    CT Head Wo Contrast  Result Date: 04/28/2019 CLINICAL DATA:  Headache and dizziness.  Fall 1 day prior. EXAM: CT HEAD WITHOUT CONTRAST TECHNIQUE: Contiguous axial images were obtained from the base of the skull through the vertex without intravenous contrast. COMPARISON:  Feb 27, 2017 FINDINGS: Brain: Mild diffuse atrophy with somewhat greater parietal lobe atrophy bilaterally is stable. There are small chronic subdural hygromas on each side with stable impression on each frontal lobe. There is no intra-axial mass. There is no acute hemorrhage evident on this study. No midline shift. No acute appearing extra-axial fluid collection. There is small vessel disease in the centra semiovale bilaterally. Tiny lacunar infarcts in the right lentiform nucleus are stable. No acute infarct is demonstrable. Vascular: No hyperdense vessel. There is calcification in each distal vertebral artery and carotid siphon region. Skull: The bony calvarium  appears intact. Sinuses/Orbits: Paranasal sinuses are clear. Orbits appear symmetric bilaterally. Other: Mastoid air cells are clear. IMPRESSION: Mild diffuse atrophy with somewhat more atrophy in the parietal lobes bilaterally than elsewhere, stable. Small bilateral subdural hygromas, stable. No new extra-axial fluid. No mass or hemorrhage. There is patchy supratentorial small vessel disease. No acute infarct evident. There are foci of arterial vascular calcification bilaterally. Electronically Signed   By: Lowella Grip III M.D.   On: 04/28/2019 13:21    Assessment & Plan:   Maydelin was seen today for mouth lesions.  Diagnoses and all orders for this visit:  Candida infection, oral- She has a hairy white tongue that may be related to hygiene.  I have asked her to be sure to brush her teeth and her tongue twice a day.  I am also concerned there may be some component of oral candidiasis so I have asked her to use clotrimazole troches 3 times a day. -     clotrimazole (MYCELEX) 10 MG troche; Take 1 tablet (10 mg total) by mouth 3 (three) times daily for 10 days.   I am having Joslin M. Reise start on clotrimazole. I am also having her maintain her folic acid, methotrexate, Vitamin D, fexofenadine, b complex vitamins, acetaminophen, traMADol, meclizine, atenolol, amLODipine, and atorvastatin.  Meds ordered this encounter  Medications  . clotrimazole (MYCELEX) 10 MG troche    Sig: Take 1 tablet (10 mg total) by mouth 3 (three) times daily for 10 days.    Dispense:  35 Troche    Refill:  0     Follow-up: Return if symptoms worsen or fail to improve.  Scarlette Calico, MD

## 2020-03-03 ENCOUNTER — Other Ambulatory Visit: Payer: Self-pay | Admitting: Internal Medicine

## 2020-03-03 DIAGNOSIS — M170 Bilateral primary osteoarthritis of knee: Secondary | ICD-10-CM

## 2020-03-03 DIAGNOSIS — M05711 Rheumatoid arthritis with rheumatoid factor of right shoulder without organ or systems involvement: Secondary | ICD-10-CM

## 2020-04-24 DIAGNOSIS — M0609 Rheumatoid arthritis without rheumatoid factor, multiple sites: Secondary | ICD-10-CM | POA: Diagnosis not present

## 2020-04-24 DIAGNOSIS — M65332 Trigger finger, left middle finger: Secondary | ICD-10-CM | POA: Diagnosis not present

## 2020-04-24 DIAGNOSIS — Z6831 Body mass index (BMI) 31.0-31.9, adult: Secondary | ICD-10-CM | POA: Diagnosis not present

## 2020-04-24 DIAGNOSIS — M25511 Pain in right shoulder: Secondary | ICD-10-CM | POA: Diagnosis not present

## 2020-04-24 DIAGNOSIS — E669 Obesity, unspecified: Secondary | ICD-10-CM | POA: Diagnosis not present

## 2020-04-24 DIAGNOSIS — M25531 Pain in right wrist: Secondary | ICD-10-CM | POA: Diagnosis not present

## 2020-04-24 DIAGNOSIS — M2550 Pain in unspecified joint: Secondary | ICD-10-CM | POA: Diagnosis not present

## 2020-04-24 DIAGNOSIS — M150 Primary generalized (osteo)arthritis: Secondary | ICD-10-CM | POA: Diagnosis not present

## 2020-04-24 DIAGNOSIS — Z79899 Other long term (current) drug therapy: Secondary | ICD-10-CM | POA: Diagnosis not present

## 2020-05-29 ENCOUNTER — Other Ambulatory Visit: Payer: Self-pay

## 2020-05-29 ENCOUNTER — Ambulatory Visit (INDEPENDENT_AMBULATORY_CARE_PROVIDER_SITE_OTHER): Payer: Medicare HMO | Admitting: Internal Medicine

## 2020-05-29 ENCOUNTER — Encounter: Payer: Self-pay | Admitting: Internal Medicine

## 2020-05-29 VITALS — BP 164/73 | HR 61 | Temp 97.6°F | Resp 16 | Ht 64.0 in | Wt 183.0 lb

## 2020-05-29 DIAGNOSIS — I10 Essential (primary) hypertension: Secondary | ICD-10-CM

## 2020-05-29 DIAGNOSIS — M05712 Rheumatoid arthritis with rheumatoid factor of left shoulder without organ or systems involvement: Secondary | ICD-10-CM | POA: Diagnosis not present

## 2020-05-29 DIAGNOSIS — E118 Type 2 diabetes mellitus with unspecified complications: Secondary | ICD-10-CM

## 2020-05-29 DIAGNOSIS — N1832 Chronic kidney disease, stage 3b: Secondary | ICD-10-CM | POA: Diagnosis not present

## 2020-05-29 DIAGNOSIS — M170 Bilateral primary osteoarthritis of knee: Secondary | ICD-10-CM

## 2020-05-29 DIAGNOSIS — E785 Hyperlipidemia, unspecified: Secondary | ICD-10-CM | POA: Diagnosis not present

## 2020-05-29 DIAGNOSIS — M05711 Rheumatoid arthritis with rheumatoid factor of right shoulder without organ or systems involvement: Secondary | ICD-10-CM

## 2020-05-29 MED ORDER — ATENOLOL 100 MG PO TABS: 100.0000 mg | ORAL_TABLET | Freq: Every day | ORAL | 1 refills | Status: DC

## 2020-05-29 MED ORDER — AMLODIPINE BESYLATE 10 MG PO TABS: 10.0000 mg | ORAL_TABLET | Freq: Every day | ORAL | 1 refills | Status: DC

## 2020-05-29 MED ORDER — ATORVASTATIN CALCIUM 10 MG PO TABS: 10.0000 mg | ORAL_TABLET | Freq: Every day | ORAL | 1 refills | Status: DC

## 2020-05-29 MED ORDER — TRAMADOL HCL 50 MG PO TABS: ORAL_TABLET | ORAL | 1 refills | Status: DC

## 2020-05-29 NOTE — Progress Notes (Signed)
Subjective:  Patient ID: Briana [name], female    DOB: [birth year]  Age: 86 y.o. MRN: [id number]  CC: Osteoarthritis, Hypertension, Diabetes, and Hyperlipidemia  This visit occurred during the SARS-CoV-2 public health emergency.  Safety protocols were in place, including screening questions prior to the visit, additional usage of staff PPE, and extensive cleaning of exam room while observing appropriate contact time as indicated for disinfecting solutions.    HPI Briana [name] presents for f/up - She continues to complain of musculoskeletal pain and wants a refill of tramadol.  She does not monitor her blood pressure but she denies headache, blurred vision, chest pain, shortness of breath, palpitations, edema, or fatigue.  Outpatient Medications Prior to Visit  Medication Sig Dispense Refill  . acetaminophen (TYLENOL) 650 MG CR tablet Take 650 mg by mouth every 8 (eight) hours as needed for pain.    Marland Kitchen b complex vitamins tablet Take 1 tablet by mouth daily.    . Cholecalciferol (VITAMIN D) 2000 UNITS tablet Take 2,000 Units by mouth daily.    . fexofenadine (ALLEGRA) 180 MG tablet Take 180 mg by mouth daily.    . folic acid (FOLVITE) 1 MG tablet Take 1 mg by mouth daily.    . meclizine (MEDI-MECLIZINE) 25 MG tablet Take 1 tablet (25 mg total) by mouth 3 (three) times daily as needed for dizziness. 90 tablet 3  . methotrexate (RHEUMATREX) 2.5 MG tablet Take 20 mg by mouth once a week. Caution:Chemotherapy. Protect from light.  Taken on Fridays.    Marland Kitchen amLODipine (NORVASC) 10 MG tablet TAKE 1 TABLET EVERY DAY 90 tablet 1  . atenolol (TENORMIN) 100 MG tablet TAKE 1 TABLET EVERY DAY 90 tablet 1  . atorvastatin (LIPITOR) 10 MG tablet TAKE 1 TABLET EVERY DAY 90 tablet 1  . traMADol (ULTRAM) 50 MG tablet TAKE 1 TABLET(50 MG) BY MOUTH EVERY 12 HOURS AS NEEDED FOR MODERATE PAIN OR SEVERE PAIN 180 tablet 1   No facility-administered medications prior to visit.    ROS Review of Systems    Constitutional: Negative for appetite change, diaphoresis, fatigue and unexpected weight change.  HENT: Negative.   Eyes: Negative for visual disturbance.  Respiratory: Negative for cough, chest tightness, shortness of breath and wheezing.   Cardiovascular: Negative for chest pain, palpitations and leg swelling.  Gastrointestinal: Negative for abdominal pain, constipation, diarrhea, nausea and vomiting.  Endocrine: Negative.  Negative for polydipsia, polyphagia and polyuria.  Genitourinary: Negative.   Musculoskeletal: Positive for arthralgias and back pain.  Skin: Negative.  Negative for color change and pallor.  Neurological: Negative.  Negative for dizziness, weakness, light-headedness, numbness and headaches.  Hematological: Negative for adenopathy. Does not bruise/bleed easily.  Psychiatric/Behavioral: Negative.     Objective:  BP (!) 164/73 (BP Location: Left Arm, Patient Position: Sitting, Cuff Size: Large)   Pulse 61   Temp 97.6 F (36.4 C) (Oral)   Resp 16   Ht 5\' 4"  (1.626 m)   Wt 183 lb (83 kg)   SpO2 97%   BMI 31.41 kg/m   BP Readings from Last 3 Encounters:  05/29/20 (!) 164/73  02/29/20 (!) 160/62  12/26/19 138/76    Wt Readings from Last 3 Encounters:  05/29/20 183 lb (83 kg)  02/29/20 183 lb (83 kg)  12/26/19 188 lb (85.3 kg)    Physical Exam Vitals reviewed.  Constitutional:      Appearance: Normal appearance.  HENT:     Nose: Nose normal.     Mouth/Throat:  Mouth: Mucous membranes are moist.  Eyes:     Conjunctiva/sclera: Conjunctivae normal.  Cardiovascular:     Rate and Rhythm: Normal rate and regular rhythm.     Heart sounds: No murmur heard.   Pulmonary:     Effort: Pulmonary effort is normal.     Breath sounds: No stridor. No wheezing, rhonchi or rales.  Abdominal:     General: Abdomen is flat. Bowel sounds are normal. There is no distension.     Palpations: Abdomen is soft. There is no hepatomegaly, splenomegaly or mass.      Tenderness: There is no abdominal tenderness.  Musculoskeletal:        General: Normal range of motion.     Cervical back: Neck supple.     Right lower leg: No edema.     Left lower leg: No edema.  Lymphadenopathy:     Cervical: No cervical adenopathy.  Skin:    General: Skin is warm and dry.     Coloration: Skin is not pale.  Neurological:     General: No focal deficit present.     Mental Status: She is alert and oriented to person, place, and time. Mental status is at baseline.  Psychiatric:        Mood and Affect: Mood normal.        Behavior: Behavior normal.     Lab Results  Component Value Date   WBC 8.9 12/26/2019   HGB 13.5 12/26/2019   HCT 40.8 12/26/2019   PLT 202.0 12/26/2019   GLUCOSE 114 (H) 05/29/2020   CHOL 191 12/26/2019   TRIG 95.0 12/26/2019   HDL 97.60 12/26/2019   LDLDIRECT 104.5 04/25/2010   LDLCALC 74 12/26/2019   ALT 21 12/26/2019   AST 22 12/26/2019   NA 141 05/29/2020   K 3.7 05/29/2020   CL 104 05/29/2020   CREATININE 0.95 (H) 05/29/2020   BUN 18 05/29/2020   CO2 28 05/29/2020   TSH 2.66 12/26/2019   INR 0.92 06/05/2014   HGBA1C 6.4 (H) 05/29/2020    CT Head Wo Contrast  Result Date: 04/28/2019 CLINICAL DATA:  Headache and dizziness.  Fall 1 day prior. EXAM: CT HEAD WITHOUT CONTRAST TECHNIQUE: Contiguous axial images were obtained from the base of the skull through the vertex without intravenous contrast. COMPARISON:  Feb 27, 2017 FINDINGS: Brain: Mild diffuse atrophy with somewhat greater parietal lobe atrophy bilaterally is stable. There are small chronic subdural hygromas on each side with stable impression on each frontal lobe. There is no intra-axial mass. There is no acute hemorrhage evident on this study. No midline shift. No acute appearing extra-axial fluid collection. There is small vessel disease in the centra semiovale bilaterally. Tiny lacunar infarcts in the right lentiform nucleus are stable. No acute infarct is demonstrable.  Vascular: No hyperdense vessel. There is calcification in each distal vertebral artery and carotid siphon region. Skull: The bony calvarium appears intact. Sinuses/Orbits: Paranasal sinuses are clear. Orbits appear symmetric bilaterally. Other: Mastoid air cells are clear. IMPRESSION: Mild diffuse atrophy with somewhat more atrophy in the parietal lobes bilaterally than elsewhere, stable. Small bilateral subdural hygromas, stable. No new extra-axial fluid. No mass or hemorrhage. There is patchy supratentorial small vessel disease. No acute infarct evident. There are foci of arterial vascular calcification bilaterally. Electronically Signed   By: Lowella Grip III M.D.   On: 04/28/2019 13:21    Assessment & Plan:   Harjot was seen today for osteoarthritis, hypertension, diabetes and hyperlipidemia.  Diagnoses and  all orders for this visit:  Type II diabetes mellitus with manifestations ([place])- Her blood sugar is adequately well controlled. -     Hemoglobin A1c; Future -     HM Diabetes Foot Exam -     Hemoglobin A1c  Essential hypertension, benign- Her blood pressure is adequately well controlled.  Electrolytes and renal function are normal. -     amLODipine (NORVASC) 10 MG tablet; Take 1 tablet (10 mg total) by mouth daily. -     atenolol (TENORMIN) 100 MG tablet; Take 1 tablet (100 mg total) by mouth daily. -     BASIC METABOLIC PANEL WITH GFR; Future -     BASIC METABOLIC PANEL WITH GFR  Hyperlipidemia with target LDL less than 130- She is doing well on the statin with no evidence of myopathy. -     atorvastatin (LIPITOR) 10 MG tablet; Take 1 tablet (10 mg total) by mouth daily. -     CK; Future -     CK  Primary osteoarthritis of both knees -     traMADol (ULTRAM) 50 MG tablet; TAKE 1 TABLET(50 MG) BY MOUTH EVERY 12 HOURS AS NEEDED FOR MODERATE PAIN OR SEVERE PAIN  Rheumatoid arthritis involving both shoulders with positive rheumatoid factor (HCC) -     traMADol (ULTRAM) 50 MG  tablet; TAKE 1 TABLET(50 MG) BY MOUTH EVERY 12 HOURS AS NEEDED FOR MODERATE PAIN OR SEVERE PAIN  Stage 3b chronic kidney disease- She will avoid nephrotoxic agents.  Will continue to maintain control of her blood pressure and her blood sugar.   I have changed Dlynn M. Laughner's amLODipine, atenolol, and atorvastatin. I am also having her maintain her folic acid, methotrexate, Vitamin D, fexofenadine, b complex vitamins, acetaminophen, meclizine, and traMADol.  Meds ordered this encounter  Medications  . amLODipine (NORVASC) 10 MG tablet    Sig: Take 1 tablet (10 mg total) by mouth daily.    Dispense:  90 tablet    Refill:  1  . atenolol (TENORMIN) 100 MG tablet    Sig: Take 1 tablet (100 mg total) by mouth daily.    Dispense:  90 tablet    Refill:  1  . atorvastatin (LIPITOR) 10 MG tablet    Sig: Take 1 tablet (10 mg total) by mouth daily.    Dispense:  90 tablet    Refill:  1  . traMADol (ULTRAM) 50 MG tablet    Sig: TAKE 1 TABLET(50 MG) BY MOUTH EVERY 12 HOURS AS NEEDED FOR MODERATE PAIN OR SEVERE PAIN    Dispense:  180 tablet    Refill:  1     Follow-up: Return in about 6 months (around 11/29/2020).  Scarlette Calico, MD

## 2020-05-29 NOTE — Patient Instructions (Signed)

## 2020-05-30 DIAGNOSIS — N1832 Chronic kidney disease, stage 3b: Secondary | ICD-10-CM | POA: Insufficient documentation

## 2020-05-30 LAB — CK: Total CK: 144 U/L — ABNORMAL HIGH (ref 29–143)

## 2020-05-30 LAB — BASIC METABOLIC PANEL WITH GFR
BUN/Creatinine Ratio: 19 (calc) (ref 6–22)
BUN: 18 mg/dL (ref 7–25)
CO2: 28 mmol/L (ref 20–32)
Calcium: 9.4 mg/dL (ref 8.6–10.4)
Chloride: 104 mmol/L (ref 98–110)
Creat: 0.95 mg/dL — ABNORMAL HIGH (ref 0.60–0.88)
GFR, Est African American: 63 mL/min/{1.73_m2} (ref >=60)
GFR, Est Non African American: 54 mL/min/{1.73_m2} — ABNORMAL LOW (ref >=60)
Glucose, Bld: 114 mg/dL — ABNORMAL HIGH (ref 65–99)
Potassium: 3.7 mmol/L (ref 3.5–5.3)
Sodium: 141 mmol/L (ref 135–146)

## 2020-05-30 LAB — HEMOGLOBIN A1C
Hgb A1c MFr Bld: 6.4 % of total Hgb — ABNORMAL HIGH (ref <5.7)
Mean Plasma Glucose: 137 (calc)
eAG (mmol/L): 7.6 (calc)

## 2020-06-05 DIAGNOSIS — Z1231 Encounter for screening mammogram for malignant neoplasm of breast: Secondary | ICD-10-CM | POA: Diagnosis not present

## 2020-06-07 ENCOUNTER — Telehealth: Payer: Self-pay | Admitting: Internal Medicine

## 2020-06-07 NOTE — Telephone Encounter (Signed)
Patient following up on blood work from 8.18.21 visit, hasn't heard results.  Please call her at [phone]

## 2020-06-13 NOTE — Telephone Encounter (Signed)
Pt contacted and informed that all labs were normal. Pt stated understanding.

## 2020-07-11 ENCOUNTER — Ambulatory Visit (INDEPENDENT_AMBULATORY_CARE_PROVIDER_SITE_OTHER): Payer: Medicare HMO

## 2020-07-11 DIAGNOSIS — Z0000 Encounter for general adult medical examination without abnormal findings: Secondary | ICD-10-CM | POA: Diagnosis not present

## 2020-07-11 NOTE — Progress Notes (Signed)
I connected with Rand Arnott today by telephone and verified that I am speaking with the correct person using two identifiers. Location patient: home Location provider: work Persons participating in the virtual visit: [name], [name]   I discussed the limitations, risks, security and privacy concerns of performing an evaluation and management service by telephone and the availability of in person appointments. I also discussed with the patient that there may be a patient responsible charge related to this service. The patient expressed understanding and verbally consented to this telephonic visit.    Interactive audio and video telecommunications were attempted between this provider and patient, however failed, due to patient having technical difficulties OR patient did not have access to video capability.  We continued and completed visit with audio only.  Some vital signs may be absent or patient reported.   Time Spent with patient on telephone encounter: 20 minutes  Subjective:   Briana [name] is a 86 y.o. female who presents for Medicare Annual (Subsequent) preventive examination.  Review of Systems    No ROS. Medicare Wellness Visit. Cardiac Risk Factors include: advanced age (>41men, >30 women);dyslipidemia;family history of premature cardiovascular disease;hypertension;obesity (BMI >30kg/m2)     Objective:    There were no vitals filed for this visit. There is no height or weight on file to calculate BMI.  Advanced Directives 07/11/2020 06/20/2019 06/02/2018 02/27/2017 11/10/2016 10/24/2015 10/23/2015  Does Patient Have a Medical Advance Directive? No Yes No No Yes Yes Yes  Type of Advance Directive - [place];Living will - - [place];Living will [place];Living will Living will;Healthcare Power of Attorney  Does patient want to make changes to medical advance directive? - - - - - No - Patient  declined No - Patient declined  Copy of [place] in Chart? - No - copy requested - - No - copy requested Yes Yes  Would patient like information on creating a medical advance directive? No - Patient declined - Yes (ED - Information included in AVS) - - - -  Pre-existing out of facility DNR order (yellow form or pink MOST form) - - - - - - -    Current Medications (verified) Outpatient Encounter Medications as of 07/11/2020  Medication Sig   acetaminophen (TYLENOL) 650 MG CR tablet Take 650 mg by mouth every 8 (eight) hours as needed for pain.   amLODipine (NORVASC) 10 MG tablet Take 1 tablet (10 mg total) by mouth daily.   atenolol (TENORMIN) 100 MG tablet Take 1 tablet (100 mg total) by mouth daily.   atorvastatin (LIPITOR) 10 MG tablet Take 1 tablet (10 mg total) by mouth daily.   Cholecalciferol (VITAMIN D) 2000 UNITS tablet Take 2,000 Units by mouth daily.   fexofenadine (ALLEGRA) 180 MG tablet Take 180 mg by mouth daily.   folic acid (FOLVITE) 1 MG tablet Take 1 mg by mouth daily.   meclizine (MEDI-MECLIZINE) 25 MG tablet Take 1 tablet (25 mg total) by mouth 3 (three) times daily as needed for dizziness.   methotrexate (RHEUMATREX) 2.5 MG tablet Take 20 mg by mouth once a week. Caution:Chemotherapy. Protect from light.  Taken on Fridays.   traMADol (ULTRAM) 50 MG tablet TAKE 1 TABLET(50 MG) BY MOUTH EVERY 12 HOURS AS NEEDED FOR MODERATE PAIN OR SEVERE PAIN   b complex vitamins tablet Take 1 tablet by mouth daily. (Patient not taking: Reported on 07/11/2020)   [DISCONTINUED] amLODipine (NORVASC) 10 MG  tablet Take 1 tablet (10 mg total) by mouth daily.   [DISCONTINUED] atenolol (TENORMIN) 100 MG tablet Take 1 tablet (100 mg total) by mouth daily.   [DISCONTINUED] atorvastatin (LIPITOR) 10 MG tablet Take 1 tablet (10 mg total) by mouth daily.   No facility-administered encounter medications on file as of 07/11/2020.    Allergies (verified) Ibuprofen,  Percocet [oxycodone-acetaminophen], Simvastatin, Diltiazem hcl, Hydrochlorothiazide, Lisinopril, and Verapamil   History: Past Medical History:  Diagnosis Date   ACUTE URIS OF UNSPECIFIED SITE 11/26/2008   ALLERGIC ARTHRITIS OTHER SPECIFIED SITES 10/22/2010   OA + RA   ALLERGIC RHINITIS 10/28/2007   ARTHRITIS, KNEES, BILATERAL 10/28/2007   BACK PAIN 04/12/2009   BUNIONECTOMY, HX OF 10/28/2007   CELLULITIS&ABSCESS OF HAND EXCEPT FINGERS&THUMB 7/42/5956   Complication of anesthesia    itching and makes me crazy   Family history of anesthesia complication    DAUGHTER HAD HIVES AND " WENT WILD "   HYPERLIPIDEMIA 10/28/2007   HYPERTENSION 10/28/2007   INSOMNIA UNSPECIFIED 10/31/2007   MYOSITIS 10/22/2010   NECK PAIN 12/03/2008   Pain in joint, site unspecified 10/07/2010   WRIST PAIN, LEFT 10/22/2010   Past Surgical History:  Procedure Laterality Date   arthroscopic surgery and rotator cuff repair left shoulder     arthroscopy right knee hx of surgery     BUNIONECTOMY     hx of   FOOT SURGERY     RIGHT   hand surgery for broken finger, remote     JOINT REPLACEMENT Right    knee   TOTAL KNEE ARTHROPLASTY  07/01/2012   Procedure: TOTAL KNEE ARTHROPLASTY;  Surgeon: Yvette Rack., MD;  Location: [facility];  Service: Orthopedics;  Laterality: Right;  right total knee arthroplasty   TOTAL KNEE ARTHROPLASTY Left 06/13/2014   dr Percell Miller   TOTAL KNEE ARTHROPLASTY Left 06/13/2014   Procedure: TOTAL KNEE ARTHROPLASTY;  Surgeon: Ninetta Lights, MD;  Location: [facility];  Service: Orthopedics;  Laterality: Left;   Family History  Problem Relation Age of Onset   Cancer Brother        lung cancer   Coronary artery disease Other    Heart attack Other    Cancer Sister        Lung, stomach   Social History   Socioeconomic History   Marital status: Widowed    Spouse name: Not on file   Number of children: 4   Years of education: 12   Highest education level: Not on file    Occupational History   Occupation: ASSN II 1    Employer: [employer]  Tobacco Use   Smoking status: Never Smoker   Smokeless tobacco: Never Used  Scientific laboratory technician Use: Never used  Substance and Sexual Activity   Alcohol use: No   Drug use: No   Sexual activity: Not Currently  Other Topics Concern   Not on file  Social History Narrative   Married in [place] widowed on 00   4 sons, 1 daughter, 6 grandchildren, 3 great-grands   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   Social Determinants of Health   Financial Resource Strain: Low Risk    Difficulty of Paying Living Expenses: Not hard at all  Food Insecurity: No Food Insecurity   Worried About Charity fundraiser in the Last Year: Never true   Arboriculturist in  the Last Year: Never true  Transportation Needs: No Transportation Needs   Lack of Transportation (Medical): No   Lack of Transportation (Non-Medical): No  Physical Activity: Sufficiently Active   Days of Exercise per Week: 4 days   Minutes of Exercise per Session: 60 min  Stress: No Stress Concern Present   Feeling of Stress : Not at all  Social Connections: Unknown   Frequency of Communication with Friends and Family: More than three times a week   Frequency of Social Gatherings with Friends and Family: More than three times a week   Attends Religious Services: Patient refused   Active Member of Clubs or Organizations: Patient refused   Attends Archivist Meetings: Patient refused   Marital Status: Widowed    Tobacco Counseling Counseling given: Not Answered   Clinical Intake:  Pre-visit preparation completed: Yes  Pain : No/denies pain     Nutritional Risks: None Diabetes: No  How often do you need to have someone help you when you read instructions, pamphlets, or other written materials from your doctor or pharmacy?: 1 - Never What is the last  grade level you completed in school?: 11th grade  Diabetic? no  Interpreter Needed?: No  Information entered by :: Lisette Abu, LPN   Activities of Daily Living In your present state of health, do you have any difficulty performing the following activities: 07/11/2020  Hearing? N  Vision? Y  Comment will be calling Dr. Katy Fitch to schedule eye exam  Difficulty concentrating or making decisions? N  Walking or climbing stairs? N  Dressing or bathing? N  Doing errands, shopping? N  Preparing Food and eating ? N  Using the Toilet? N  In the past six months, have you accidently leaked urine? N  Do you have problems with loss of bowel control? N  Managing your Medications? N  Managing your Finances? N  Housekeeping or managing your Housekeeping? N  Some recent data might be hidden    Patient Care Team: Janith Lima, MD as PCP - General (Internal Medicine) Gavin Pound, MD as Consulting Physician (Rheumatology) Clent Jacks, MD as Consulting Physician (Ophthalmology) Gatha Mayer, MD as Consulting Physician (Gastroenterology)  Indicate any recent Medical Services you may have received from other than [facility] providers in the past year (date may be approximate).     Assessment:   This is a routine wellness examination for Briana [name].  Hearing/Vision screen No exam data present  Dietary issues and exercise activities discussed: Current Exercise Habits: Home exercise routine, Type of exercise: Other - see comments (TV exercises 4 times a week for 1 hour; cleans and cooks everyday), Time (Minutes): 60, Frequency (Times/Week): 4, Weekly Exercise (Minutes/Week): 240, Intensity: Mild, Exercise limited by: None identified  Goals     Patient Stated     Stay active physically and socially active. Enjoy life, church and love family.      Depression Screen PHQ 2/9 Scores 07/11/2020 06/20/2019 12/16/2018 06/02/2018 12/02/2017 11/10/2016 10/24/2015  PHQ - 2 Score 0 0 0 1 0 0 0  PHQ- 9  Score - - - 6 - - -    Fall Risk Fall Risk  07/11/2020 06/20/2019 12/16/2018 12/15/2018 06/02/2018  Falls in the past year? 0 1 0 0 No  Number falls in past yr: 0 0 - 0 -  Injury with Fall? 0 1 - 0 -  Risk for fall due to : No Fall Risks Impaired balance/gait - - Impaired balance/gait;Impaired mobility;History of fall(s)  Risk for fall due to: Comment - - - - discussed fall precautions  Follow up Falls evaluation completed Falls prevention discussed - Falls evaluation completed -    Any stairs in or around the home? No  If so, are there any without handrails? No  Home free of loose throw rugs in walkways, pet beds, electrical cords, etc? Yes  Adequate lighting in your home to reduce risk of falls? Yes   ASSISTIVE DEVICES UTILIZED TO PREVENT FALLS:  Life alert? No  Use of a cane, walker or w/c? No  Grab bars in the bathroom? No  Shower chair or bench in shower? Yes  Elevated toilet seat or a handicapped toilet? No   TIMED UP AND GO:  Was the test performed? No .  Length of time to ambulate 10 feet: 0 sec.   Gait steady and fast without use of assistive device  Cognitive Function: MMSE - Mini Mental State Exam 06/20/2019 06/02/2018  Not completed: Refused -  Orientation to time - 5  Orientation to Place - 5  Registration - 3  Attention/ Calculation - 3  Recall - 3  Language- name 2 objects - 2  Language- repeat - 1  Language- follow 3 step command - 3  Language- read & follow direction - 1  Write a sentence - 1  Copy design - 1  Total score - 28        Immunizations Immunization History  Administered Date(s) Administered   Influenza Split 07/03/2012   Influenza, High Dose Seasonal PF 07/24/2013, 07/15/2018   Influenza,inj,Quad PF,6+ Mos 08/06/2014   Influenza-Unspecified 07/28/2016, 07/12/2017, 07/18/2019   Pneumococcal Conjugate-13 10/23/2015   Pneumococcal Polysaccharide-23 04/19/2014   Td 05/30/2012   Zoster 07/13/2015    TDAP status: Up to date Flu  Vaccine status: Up to date Pneumococcal vaccine status: Up to date Covid-19 vaccine status: Completed vaccines  Qualifies for Shingles Vaccine? Yes   Zostavax completed Yes   Shingrix Completed?: No.    Education has been provided regarding the importance of this vaccine. Patient has been advised to call insurance company to determine out of pocket expense if they have not yet received this vaccine. Advised may also receive vaccine at local pharmacy or Health Dept. Verbalized acceptance and understanding.  Screening Tests Health Maintenance  Topic Date Due   OPHTHALMOLOGY EXAM  Never done   URINE MICROALBUMIN  Never done   COVID-19 Vaccine (1) Never done   INFLUENZA VACCINE  05/12/2020   HEMOGLOBIN A1C  11/29/2020   FOOT EXAM  05/29/2021   TETANUS/TDAP  05/30/2022   DEXA SCAN  Completed   PNA vac Low Risk Adult  Completed    Health Maintenance  Health Maintenance Due  Topic Date Due   OPHTHALMOLOGY EXAM  Never done   URINE MICROALBUMIN  Never done   COVID-19 Vaccine (1) Never done   INFLUENZA VACCINE  05/12/2020    Colorectal cancer screening: No longer required.  Mammogram status: Completed 06/05/2020. Repeat every year Bone Density status: Completed 04/19/2014. Results reflect: Bone density results: NORMAL. Repeat every 0 years.  Lung Cancer Screening: (Low Dose CT Chest recommended if Age 68-80 years, 30 pack-year currently smoking OR have quit w/in 15years.) does not qualify.   Lung Cancer Screening Referral: no  Additional Screening:  Hepatitis C Screening: does not qualify; Completed no  Vision Screening: Recommended annual ophthalmology exams for early detection of glaucoma and other disorders of the eye. Is the patient up to date with their annual eye  exam?  Yes  Who is the provider or what is the name of the office in which the patient attends annual eye exams? Clent Jacks, MD If pt is not established with a provider, would they like to be referred  to a provider to establish care? No .   Dental Screening: Recommended annual dental exams for proper oral hygiene  Community Resource Referral / Chronic Care Management: CRR required this visit?  No   CCM required this visit?  No      Plan:     I have personally reviewed and noted the following in the patients chart:    Medical and social history  Use of alcohol, tobacco or illicit drugs   Current medications and supplements  Functional ability and status  Nutritional status  Physical activity  Advanced directives  List of other physicians  Hospitalizations, surgeries, and ER visits in previous 12 months  Vitals  Screenings to include cognitive, depression, and falls  Referrals and appointments  In addition, I have reviewed and discussed with patient certain preventive protocols, quality metrics, and best practice recommendations. A written personalized care plan for preventive services as well as general preventive health recommendations were provided to patient.     Sheral Flow, LPN   3/60/6770   Nurse Notes:  Patient is cogitatively intact. There were no vitals filed for this visit. There is no height or weight on file to calculate BMI. Patient stated that she has no issues with gait or balance; does not use any assistive devices.

## 2020-07-11 NOTE — Patient Instructions (Addendum)
Briana [name] , Thank you for taking time to come for your Medicare Wellness Visit. I appreciate your ongoing commitment to your health goals. Please review the following plan we discussed and let me know if I can assist you in the future.   Screening recommendations/referrals: Colonoscopy: no repeat due to age 86: 06/05/2020 Bone Density: 04/19/2014 Recommended yearly ophthalmology/optometry visit for glaucoma screening and checkup Recommended yearly dental visit for hygiene and checkup  Vaccinations: Influenza vaccine: 07/18/2019 Pneumococcal vaccine: completed Tdap vaccine: 05/30/2012; due every 10 years Shingles vaccine: never done   Covid-19: completed  Advanced directives: Advance directive discussed with you today. Even though you declined this today please call our office should you change your mind and we can give you the proper paperwork for you to fill out.  Conditions/risks identified: Yes. Reviewed health maintenance screenings with patient today and relevant education, vaccines, and/or referrals were provided. Continue doing brain stimulating activities (puzzles, reading, adult coloring books, staying active) to keep memory sharp. Continue to eat heart healthy diet (full of fruits, vegetables, whole grains, lean protein, water--limit salt, fat, and sugar intake) and increase physical activity as tolerated.  Next appointment: Please schedule your next Medicare Wellness Visit with your Nurse Health Advisor in 1 year by calling [phone].  Preventive Care 84 Years and Older, Female Preventive care refers to lifestyle choices and visits with your health care provider that can promote health and wellness. What does preventive care include?  A yearly physical exam. This is also called an annual well check.  Dental exams once or twice a year.  Routine eye exams. Ask your health care provider how often you should have your eyes checked.  Personal lifestyle choices,  including:  Daily care of your teeth and gums.  Regular physical activity.  Eating a healthy diet.  Avoiding tobacco and drug use.  Limiting alcohol use.  Practicing safe sex.  Taking low-dose aspirin every day.  Taking vitamin and mineral supplements as recommended by your health care provider. What happens during an annual well check? The services and screenings done by your health care provider during your annual well check will depend on your age, overall health, lifestyle risk factors, and family history of disease. Counseling  Your health care provider may ask you questions about your:  Alcohol use.  Tobacco use.  Drug use.  Emotional well-being.  Home and relationship well-being.  Sexual activity.  Eating habits.  History of falls.  Memory and ability to understand (cognition).  Work and work Statistician.  Reproductive health. Screening  You may have the following tests or measurements:  Height, weight, and BMI.  Blood pressure.  Lipid and cholesterol levels. These may be checked every 5 years, or more frequently if you are over 84 years old.  Skin check.  Lung cancer screening. You may have this screening every year starting at age 84 if you have a 30-pack-year history of smoking and currently smoke or have quit within the past 15 years.  Fecal occult blood test (FOBT) of the stool. You may have this test every year starting at age 84.  Flexible sigmoidoscopy or colonoscopy. You may have a sigmoidoscopy every 5 years or a colonoscopy every 10 years starting at age 84.  Hepatitis C blood test.  Hepatitis B blood test.  Sexually transmitted disease (STD) testing.  Diabetes screening. This is done by checking your blood sugar (glucose) after you have not eaten for a while (fasting). You may have this done every 1-3 years.  Bone density scan. This is done to screen for osteoporosis. You may have this done starting at age 84.  Mammogram. This  may be done every 1-2 years. Talk to your health care provider about how often you should have regular mammograms. Talk with your health care provider about your test results, treatment options, and if necessary, the need for more tests. Vaccines  Your health care provider may recommend certain vaccines, such as:  Influenza vaccine. This is recommended every year.  Tetanus, diphtheria, and acellular pertussis (Tdap, Td) vaccine. You may need a Td booster every 10 years.  Zoster vaccine. You may need this after age 84.  Pneumococcal 13-valent conjugate (PCV13) vaccine. One dose is recommended after age 84.  Pneumococcal polysaccharide (PPSV23) vaccine. One dose is recommended after age 84. Talk to your health care provider about which screenings and vaccines you need and how often you need them. This information is not intended to replace advice given to you by your health care provider. Make sure you discuss any questions you have with your health care provider. Document Released: 10/25/2015 Document Revised: 06/17/2016 Document Reviewed: 07/30/2015 Elsevier Interactive Patient Education  2017 [place] Prevention in the Home Falls can cause injuries. They can happen to people of all ages. There are many things you can do to make your home safe and to help prevent falls. What can I do on the outside of my home?  Regularly fix the edges of walkways and driveways and fix any cracks.  Remove anything that might make you trip as you walk through a door, such as a raised step or threshold.  Trim any bushes or trees on the path to your home.  Use bright outdoor lighting.  Clear any walking paths of anything that might make someone trip, such as rocks or tools.  Regularly check to see if handrails are loose or broken. Make sure that both sides of any steps have handrails.  Any raised decks and porches should have guardrails on the edges.  Have any leaves, snow, or ice cleared  regularly.  Use sand or salt on walking paths during winter.  Clean up any spills in your garage right away. This includes oil or grease spills. What can I do in the bathroom?  Use night lights.  Install grab bars by the toilet and in the tub and shower. Do not use towel bars as grab bars.  Use non-skid mats or decals in the tub or shower.  If you need to sit down in the shower, use a plastic, non-slip stool.  Keep the floor dry. Clean up any water that spills on the floor as soon as it happens.  Remove soap buildup in the tub or shower regularly.  Attach bath mats securely with double-sided non-slip rug tape.  Do not have throw rugs and other things on the floor that can make you trip. What can I do in the bedroom?  Use night lights.  Make sure that you have a light by your bed that is easy to reach.  Do not use any sheets or blankets that are too big for your bed. They should not hang down onto the floor.  Have a firm chair that has side arms. You can use this for support while you get dressed.  Do not have throw rugs and other things on the floor that can make you trip. What can I do in the kitchen?  Clean up any spills right away.  Avoid walking  on wet floors.  Keep items that you use a lot in easy-to-reach places.  If you need to reach something above you, use a strong step stool that has a grab bar.  Keep electrical cords out of the way.  Do not use floor polish or wax that makes floors slippery. If you must use wax, use non-skid floor wax.  Do not have throw rugs and other things on the floor that can make you trip. What can I do with my stairs?  Do not leave any items on the stairs.  Make sure that there are handrails on both sides of the stairs and use them. Fix handrails that are broken or loose. Make sure that handrails are as long as the stairways.  Check any carpeting to make sure that it is firmly attached to the stairs. Fix any carpet that is loose  or worn.  Avoid having throw rugs at the top or bottom of the stairs. If you do have throw rugs, attach them to the floor with carpet tape.  Make sure that you have a light switch at the top of the stairs and the bottom of the stairs. If you do not have them, ask someone to add them for you. What else can I do to help prevent falls?  Wear shoes that:  Do not have high heels.  Have rubber bottoms.  Are comfortable and fit you well.  Are closed at the toe. Do not wear sandals.  If you use a stepladder:  Make sure that it is fully opened. Do not climb a closed stepladder.  Make sure that both sides of the stepladder are locked into place.  Ask someone to hold it for you, if possible.  Clearly mark and make sure that you can see:  Any grab bars or handrails.  First and last steps.  Where the edge of each step is.  Use tools that help you move around (mobility aids) if they are needed. These include:  Canes.  Walkers.  Scooters.  Crutches.  Turn on the lights when you go into a dark area. Replace any light bulbs as soon as they burn out.  Set up your furniture so you have a clear path. Avoid moving your furniture around.  If any of your floors are uneven, fix them.  If there are any pets around you, be aware of where they are.  Review your medicines with your doctor. Some medicines can make you feel dizzy. This can increase your chance of falling. Ask your doctor what other things that you can do to help prevent falls. This information is not intended to replace advice given to you by your health care provider. Make sure you discuss any questions you have with your health care provider. Document Released: 07/25/2009 Document Revised: 03/05/2016 Document Reviewed: 11/02/2014 Elsevier Interactive Patient Education  2017 [employer].

## 2020-07-15 ENCOUNTER — Telehealth: Payer: Self-pay | Admitting: Internal Medicine

## 2020-07-15 ENCOUNTER — Other Ambulatory Visit: Payer: Self-pay | Admitting: Internal Medicine

## 2020-07-15 NOTE — Telephone Encounter (Signed)
Patient called and said when she lays down at night she is wheezing when breathing and slight SOB and coughing, states that she has no chest pain. Transferred to [place] at [facility].

## 2020-07-15 NOTE — Telephone Encounter (Signed)
Called pt, LVM.   

## 2020-07-15 NOTE — Telephone Encounter (Signed)
Patient wants a Ventolin Inhaler refilled. Instructed to call the office  Patient has inhaler, just needs refilled    [facility] [place], [address] Phone:  [phone]  Fax:  [phone]     Coming for an appointment on 10.12.21

## 2020-07-23 ENCOUNTER — Ambulatory Visit: Payer: Medicare HMO | Admitting: Internal Medicine

## 2020-08-06 ENCOUNTER — Other Ambulatory Visit: Payer: Self-pay | Admitting: Internal Medicine

## 2020-08-06 ENCOUNTER — Telehealth: Payer: Self-pay | Admitting: Internal Medicine

## 2020-08-06 DIAGNOSIS — H8113 Benign paroxysmal vertigo, bilateral: Secondary | ICD-10-CM

## 2020-08-06 MED ORDER — MECLIZINE HCL 25 MG PO TABS: 25.0000 mg | ORAL_TABLET | Freq: Three times a day (TID) | ORAL | 3 refills | Status: DC | PRN

## 2020-08-06 NOTE — Telephone Encounter (Signed)
  1.Medication Requested:meclizine (MEDI-MECLIZINE) 25 MG tablet  2. Pharmacy (Name, Street, City):[facility] [place], [address]  3. On Med List: yes  4. Last Visit with PCP: 05/29/20  5. Next visit date with PCP: n/a   Agent: Please be advised that RX refills may take up to 3 business days. We ask that you follow-up with your pharmacy.

## 2020-08-15 DIAGNOSIS — M25531 Pain in right wrist: Secondary | ICD-10-CM | POA: Diagnosis not present

## 2020-08-15 DIAGNOSIS — Z6831 Body mass index (BMI) 31.0-31.9, adult: Secondary | ICD-10-CM | POA: Diagnosis not present

## 2020-08-15 DIAGNOSIS — M0609 Rheumatoid arthritis without rheumatoid factor, multiple sites: Secondary | ICD-10-CM | POA: Diagnosis not present

## 2020-08-15 DIAGNOSIS — M65332 Trigger finger, left middle finger: Secondary | ICD-10-CM | POA: Diagnosis not present

## 2020-08-15 DIAGNOSIS — E669 Obesity, unspecified: Secondary | ICD-10-CM | POA: Diagnosis not present

## 2020-08-15 DIAGNOSIS — M150 Primary generalized (osteo)arthritis: Secondary | ICD-10-CM | POA: Diagnosis not present

## 2020-08-15 DIAGNOSIS — M2550 Pain in unspecified joint: Secondary | ICD-10-CM | POA: Diagnosis not present

## 2020-08-15 DIAGNOSIS — M25511 Pain in right shoulder: Secondary | ICD-10-CM | POA: Diagnosis not present

## 2020-08-15 DIAGNOSIS — Z79899 Other long term (current) drug therapy: Secondary | ICD-10-CM | POA: Diagnosis not present

## 2020-09-30 DIAGNOSIS — H401113 Primary open-angle glaucoma, right eye, severe stage: Secondary | ICD-10-CM | POA: Diagnosis not present

## 2020-09-30 DIAGNOSIS — Z961 Presence of intraocular lens: Secondary | ICD-10-CM | POA: Diagnosis not present

## 2020-09-30 DIAGNOSIS — H401121 Primary open-angle glaucoma, left eye, mild stage: Secondary | ICD-10-CM | POA: Diagnosis not present

## 2020-09-30 DIAGNOSIS — H26493 Other secondary cataract, bilateral: Secondary | ICD-10-CM | POA: Diagnosis not present

## 2020-09-30 DIAGNOSIS — H10413 Chronic giant papillary conjunctivitis, bilateral: Secondary | ICD-10-CM | POA: Diagnosis not present

## 2020-09-30 DIAGNOSIS — H43813 Vitreous degeneration, bilateral: Secondary | ICD-10-CM | POA: Diagnosis not present

## 2020-09-30 DIAGNOSIS — H04123 Dry eye syndrome of bilateral lacrimal glands: Secondary | ICD-10-CM | POA: Diagnosis not present

## 2020-09-30 LAB — HM DIABETES EYE EXAM

## 2020-10-02 DIAGNOSIS — Z0100 Encounter for examination of eyes and vision without abnormal findings: Secondary | ICD-10-CM | POA: Diagnosis not present

## 2020-10-29 ENCOUNTER — Other Ambulatory Visit: Payer: Self-pay | Admitting: Internal Medicine

## 2020-10-29 DIAGNOSIS — E785 Hyperlipidemia, unspecified: Secondary | ICD-10-CM

## 2020-10-29 DIAGNOSIS — I10 Essential (primary) hypertension: Secondary | ICD-10-CM

## 2020-11-11 DIAGNOSIS — H401113 Primary open-angle glaucoma, right eye, severe stage: Secondary | ICD-10-CM | POA: Diagnosis not present

## 2020-11-11 DIAGNOSIS — H401121 Primary open-angle glaucoma, left eye, mild stage: Secondary | ICD-10-CM | POA: Diagnosis not present

## 2020-11-15 ENCOUNTER — Telehealth: Payer: Self-pay | Admitting: Internal Medicine

## 2020-11-15 NOTE — Progress Notes (Signed)
  Chronic Care Management   Note  11/15/2020 Name: NILEY HELBIG MRN: [id number] DOB: August 19, 1931  Gar Gibbon Velie is a 87 y.o. year old female who is a primary care patient of Janith Lima, MD. I reached out to Vertell Novak by phone today in response to a referral sent by Ms. Nazly M Gully's PCP, Janith Lima, MD.   Ms. Bluett was given information about Chronic Care Management services today including:  1. CCM service includes personalized support from designated clinical staff supervised by her physician, including individualized plan of care and coordination with other care providers 2. 24/7 contact phone numbers for assistance for urgent and routine care needs. 3. Service will only be billed when office clinical staff spend 20 minutes or more in a month to coordinate care. 4. Only one practitioner may furnish and bill the service in a calendar month. 5. The patient may stop CCM services at any time (effective at the end of the month) by phone call to the office staff.   Patient agreed to services and verbal consent obtained.   Follow up plan:   Carley Perdue [employer] Scheduler

## 2020-11-21 DIAGNOSIS — M25531 Pain in right wrist: Secondary | ICD-10-CM | POA: Diagnosis not present

## 2020-11-21 DIAGNOSIS — Z6830 Body mass index (BMI) 30.0-30.9, adult: Secondary | ICD-10-CM | POA: Diagnosis not present

## 2020-11-21 DIAGNOSIS — M2550 Pain in unspecified joint: Secondary | ICD-10-CM | POA: Diagnosis not present

## 2020-11-21 DIAGNOSIS — M150 Primary generalized (osteo)arthritis: Secondary | ICD-10-CM | POA: Diagnosis not present

## 2020-11-21 DIAGNOSIS — M0609 Rheumatoid arthritis without rheumatoid factor, multiple sites: Secondary | ICD-10-CM | POA: Diagnosis not present

## 2020-11-21 DIAGNOSIS — M25511 Pain in right shoulder: Secondary | ICD-10-CM | POA: Diagnosis not present

## 2020-11-21 DIAGNOSIS — M25512 Pain in left shoulder: Secondary | ICD-10-CM | POA: Diagnosis not present

## 2020-11-21 DIAGNOSIS — M65332 Trigger finger, left middle finger: Secondary | ICD-10-CM | POA: Diagnosis not present

## 2020-11-21 DIAGNOSIS — Z79899 Other long term (current) drug therapy: Secondary | ICD-10-CM | POA: Diagnosis not present

## 2020-11-27 DIAGNOSIS — H401113 Primary open-angle glaucoma, right eye, severe stage: Secondary | ICD-10-CM | POA: Diagnosis not present

## 2020-12-30 ENCOUNTER — Ambulatory Visit (INDEPENDENT_AMBULATORY_CARE_PROVIDER_SITE_OTHER): Payer: Medicare HMO | Admitting: Internal Medicine

## 2020-12-30 ENCOUNTER — Encounter: Payer: Self-pay | Admitting: Internal Medicine

## 2020-12-30 ENCOUNTER — Other Ambulatory Visit: Payer: Self-pay

## 2020-12-30 VITALS — BP 152/74 | HR 68 | Temp 98.2°F | Resp 16 | Ht 64.0 in | Wt 173.0 lb

## 2020-12-30 DIAGNOSIS — M05712 Rheumatoid arthritis with rheumatoid factor of left shoulder without organ or systems involvement: Secondary | ICD-10-CM

## 2020-12-30 DIAGNOSIS — E785 Hyperlipidemia, unspecified: Secondary | ICD-10-CM

## 2020-12-30 DIAGNOSIS — Z23 Encounter for immunization: Secondary | ICD-10-CM | POA: Diagnosis not present

## 2020-12-30 DIAGNOSIS — M05711 Rheumatoid arthritis with rheumatoid factor of right shoulder without organ or systems involvement: Secondary | ICD-10-CM | POA: Diagnosis not present

## 2020-12-30 DIAGNOSIS — H8113 Benign paroxysmal vertigo, bilateral: Secondary | ICD-10-CM | POA: Diagnosis not present

## 2020-12-30 DIAGNOSIS — E118 Type 2 diabetes mellitus with unspecified complications: Secondary | ICD-10-CM

## 2020-12-30 DIAGNOSIS — I10 Essential (primary) hypertension: Secondary | ICD-10-CM | POA: Diagnosis not present

## 2020-12-30 DIAGNOSIS — Z0000 Encounter for general adult medical examination without abnormal findings: Secondary | ICD-10-CM

## 2020-12-30 DIAGNOSIS — M170 Bilateral primary osteoarthritis of knee: Secondary | ICD-10-CM | POA: Diagnosis not present

## 2020-12-30 LAB — URINALYSIS, ROUTINE W REFLEX MICROSCOPIC
Bilirubin Urine: NEGATIVE
Hgb urine dipstick: NEGATIVE
Ketones, ur: NEGATIVE
Leukocytes,Ua: NEGATIVE
Nitrite: NEGATIVE
Specific Gravity, Urine: 1.015 (ref 1.000–1.030)
Total Protein, Urine: NEGATIVE
Urine Glucose: NEGATIVE
Urobilinogen, UA: 0.2 (ref 0.0–1.0)
pH: 6.5 (ref 5.0–8.0)

## 2020-12-30 LAB — CBC WITH DIFFERENTIAL/PLATELET
Basophils Absolute: 0.1 10*3/uL (ref 0.0–0.1)
Basophils Relative: 0.8 % (ref 0.0–3.0)
Eosinophils Absolute: 0.1 10*3/uL (ref 0.0–0.7)
Eosinophils Relative: 1.7 % (ref 0.0–5.0)
HCT: 37.8 % (ref 36.0–46.0)
Hemoglobin: 13 g/dL (ref 12.0–15.0)
Lymphocytes Relative: 20.7 % (ref 12.0–46.0)
Lymphs Abs: 1.7 10*3/uL (ref 0.7–4.0)
MCHC: 34.5 g/dL (ref 30.0–36.0)
MCV: 94.7 fl (ref 78.0–100.0)
Monocytes Absolute: 0.5 10*3/uL (ref 0.1–1.0)
Monocytes Relative: 5.8 % (ref 3.0–12.0)
Neutro Abs: 5.8 10*3/uL (ref 1.4–7.7)
Neutrophils Relative %: 71 % (ref 43.0–77.0)
Platelets: 190 10*3/uL (ref 150.0–400.0)
RBC: 3.99 Mil/uL (ref 3.87–5.11)
RDW: 16.2 % — ABNORMAL HIGH (ref 11.5–15.5)
WBC: 8.2 10*3/uL (ref 4.0–10.5)

## 2020-12-30 LAB — LIPID PANEL
Cholesterol: 193 mg/dL (ref 0–200)
HDL: 108.8 mg/dL (ref >39.00)
LDL Cholesterol: 72 mg/dL (ref 0–99)
NonHDL: 83.86
Total CHOL/HDL Ratio: 2
Triglycerides: 61 mg/dL (ref 0.0–149.0)
VLDL: 12.2 mg/dL (ref 0.0–40.0)

## 2020-12-30 LAB — BASIC METABOLIC PANEL
BUN: 18 mg/dL (ref 6–23)
CO2: 29 mEq/L (ref 19–32)
Calcium: 9.6 mg/dL (ref 8.4–10.5)
Chloride: 103 mEq/L (ref 96–112)
Creatinine, Ser: 0.95 mg/dL (ref 0.40–1.20)
GFR: 53.94 mL/min — ABNORMAL LOW (ref >60.00)
Glucose, Bld: 124 mg/dL — ABNORMAL HIGH (ref 70–99)
Potassium: 3.6 mEq/L (ref 3.5–5.1)
Sodium: 142 mEq/L (ref 135–145)

## 2020-12-30 LAB — HEPATIC FUNCTION PANEL
ALT: 25 U/L (ref 0–35)
AST: 21 U/L (ref 0–37)
Albumin: 4.2 g/dL (ref 3.5–5.2)
Alkaline Phosphatase: 108 U/L (ref 39–117)
Bilirubin, Direct: 0.1 mg/dL (ref 0.0–0.3)
Total Bilirubin: 1 mg/dL (ref 0.2–1.2)
Total Protein: 7.3 g/dL (ref 6.0–8.3)

## 2020-12-30 LAB — MICROALBUMIN / CREATININE URINE RATIO
Creatinine,U: 141.9 mg/dL
Microalb Creat Ratio: 4.5 mg/g (ref 0.0–30.0)
Microalb, Ur: 6.3 mg/dL — ABNORMAL HIGH (ref 0.0–1.9)

## 2020-12-30 LAB — CK: Total CK: 115 U/L (ref 7–177)

## 2020-12-30 LAB — TSH: TSH: 1.84 u[IU]/mL (ref 0.35–4.50)

## 2020-12-30 LAB — HEMOGLOBIN A1C: Hgb A1c MFr Bld: 6.3 % (ref 4.6–6.5)

## 2020-12-30 MED ORDER — ATORVASTATIN CALCIUM 10 MG PO TABS: 10.0000 mg | ORAL_TABLET | Freq: Every day | ORAL | 1 refills | Status: DC

## 2020-12-30 MED ORDER — TRAMADOL HCL 50 MG PO TABS: ORAL_TABLET | ORAL | 1 refills | Status: DC

## 2020-12-30 MED ORDER — ATENOLOL 100 MG PO TABS: 100.0000 mg | ORAL_TABLET | Freq: Every day | ORAL | 1 refills | Status: DC

## 2020-12-30 MED ORDER — AMLODIPINE BESYLATE 10 MG PO TABS: 10.0000 mg | ORAL_TABLET | Freq: Every day | ORAL | 1 refills | Status: DC

## 2020-12-30 MED ORDER — MECLIZINE HCL 25 MG PO TABS: 25.0000 mg | ORAL_TABLET | Freq: Three times a day (TID) | ORAL | 3 refills | Status: DC | PRN

## 2020-12-30 NOTE — Patient Instructions (Signed)
Health Maintenance, Female Adopting a healthy lifestyle and getting preventive care are important in promoting health and wellness. Ask your health care provider about:  The right schedule for you to have regular tests and exams.  Things you can do on your own to prevent diseases and keep yourself healthy. What should I know about diet, weight, and exercise? Eat a healthy diet  Eat a diet that includes plenty of vegetables, fruits, low-fat dairy products, and lean protein.  Do not eat a lot of foods that are high in solid fats, added sugars, or sodium.   Maintain a healthy weight Body mass index (BMI) is used to identify weight problems. It estimates body fat based on height and weight. Your health care provider can help determine your BMI and help you achieve or maintain a healthy weight. Get regular exercise Get regular exercise. This is one of the most important things you can do for your health. Most adults should:  Exercise for at least 150 minutes each week. The exercise should increase your heart rate and make you sweat (moderate-intensity exercise).  Do strengthening exercises at least twice a week. This is in addition to the moderate-intensity exercise.  Spend less time sitting. Even light physical activity can be beneficial. Watch cholesterol and blood lipids Have your blood tested for lipids and cholesterol at 85 years of age, then have this test every 5 years. Have your cholesterol levels checked more often if:  Your lipid or cholesterol levels are high.  You are older than 85 years of age.  You are at high risk for heart disease. What should I know about cancer screening? Depending on your health history and family history, you may need to have cancer screening at various ages. This may include screening for:  Breast cancer.  Cervical cancer.  Colorectal cancer.  Skin cancer.  Lung cancer. What should I know about heart disease, diabetes, and high blood  pressure? Blood pressure and heart disease  High blood pressure causes heart disease and increases the risk of stroke. This is more likely to develop in people who have high blood pressure readings, are of African descent, or are overweight.  Have your blood pressure checked: ? Every 3-5 years if you are 18-39 years of age. ? Every year if you are 40 years old or older. Diabetes Have regular diabetes screenings. This checks your fasting blood sugar level. Have the screening done:  Once every three years after age 40 if you are at a normal weight and have a low risk for diabetes.  More often and at a younger age if you are overweight or have a high risk for diabetes. What should I know about preventing infection? Hepatitis B If you have a higher risk for hepatitis B, you should be screened for this virus. Talk with your health care provider to find out if you are at risk for hepatitis B infection. Hepatitis C Testing is recommended for:  Everyone born from 1945 through 1965.  Anyone with known risk factors for hepatitis C. Sexually transmitted infections (STIs)  Get screened for STIs, including gonorrhea and chlamydia, if: ? You are sexually active and are younger than 85 years of age. ? You are older than 85 years of age and your health care provider tells you that you are at risk for this type of infection. ? Your sexual activity has changed since you were last screened, and you are at increased risk for chlamydia or gonorrhea. Ask your health care provider   if you are at risk.  Ask your health care provider about whether you are at high risk for HIV. Your health care provider may recommend a prescription medicine to help prevent HIV infection. If you choose to take medicine to prevent HIV, you should first get tested for HIV. You should then be tested every 3 months for as long as you are taking the medicine. Pregnancy  If you are about to stop having your period (premenopausal) and  you may become pregnant, seek counseling before you get pregnant.  Take 400 to 800 micrograms (mcg) of folic acid every day if you become pregnant.  Ask for birth control (contraception) if you want to prevent pregnancy. Osteoporosis and menopause Osteoporosis is a disease in which the bones lose minerals and strength with aging. This can result in bone fractures. If you are 65 years old or older, or if you are at risk for osteoporosis and fractures, ask your health care provider if you should:  Be screened for bone loss.  Take a calcium or vitamin D supplement to lower your risk of fractures.  Be given hormone replacement therapy (HRT) to treat symptoms of menopause. Follow these instructions at home: Lifestyle  Do not use any products that contain nicotine or tobacco, such as cigarettes, e-cigarettes, and chewing tobacco. If you need help quitting, ask your health care provider.  Do not use street drugs.  Do not share needles.  Ask your health care provider for help if you need support or information about quitting drugs. Alcohol use  Do not drink alcohol if: ? Your health care provider tells you not to drink. ? You are pregnant, may be pregnant, or are planning to become pregnant.  If you drink alcohol: ? Limit how much you use to 0-1 drink a day. ? Limit intake if you are breastfeeding.  Be aware of how much alcohol is in your drink. In the [place] one drink equals one 12 oz bottle of beer (355 mL), one 5 oz glass of wine (148 mL), or one 1 oz glass of hard liquor (44 mL). General instructions  Schedule regular health, dental, and eye exams.  Stay current with your vaccines.  Tell your health care provider if: ? You often feel depressed. ? You have ever been abused or do not feel safe at home. Summary  Adopting a healthy lifestyle and getting preventive care are important in promoting health and wellness.  Follow your health care provider's instructions about healthy  diet, exercising, and getting tested or screened for diseases.  Follow your health care provider's instructions on monitoring your cholesterol and blood pressure. This information is not intended to replace advice given to you by your health care provider. Make sure you discuss any questions you have with your health care provider. Document Revised: 09/21/2018 Document Reviewed: 09/21/2018 Elsevier Patient Education  2021 Elsevier Inc.  

## 2020-12-30 NOTE — Progress Notes (Unsigned)
Subjective:  Patient ID: Briana [name], female    DOB: 1931/06/04  Age: 87 y.o. MRN: [id number]  CC: Annual Exam, Hypertension, Hyperlipidemia, and Diabetes  This visit occurred during the SARS-CoV-2 public health emergency.  Safety protocols were in place, including screening questions prior to the visit, additional usage of staff PPE, and extensive cleaning of exam room while observing appropriate contact time as indicated for disinfecting solutions.    HPI Briana [name] presents for a CPX.  She continues to complain of pain in her large joints.  She is getting adequate symptom relief with tramadol.  She tells me her blood pressure has been well controlled and she denies any recent episodes of headache, blurred vision, chest pain, or shortness of breath.  Outpatient Medications Prior to Visit  Medication Sig Dispense Refill  . acetaminophen (TYLENOL) 650 MG CR tablet Take 650 mg by mouth every 8 (eight) hours as needed for pain.    Marland Kitchen b complex vitamins tablet Take 1 tablet by mouth daily.    . Cholecalciferol (VITAMIN D) 2000 UNITS tablet Take 2,000 Units by mouth daily.    . fexofenadine (ALLEGRA) 180 MG tablet Take 180 mg by mouth daily.    . folic acid (FOLVITE) 1 MG tablet Take 1 mg by mouth daily.    . methotrexate (RHEUMATREX) 2.5 MG tablet Take 20 mg by mouth once a week. Caution:Chemotherapy. Protect from light.  Taken on Fridays.    Marland Kitchen amLODipine (NORVASC) 10 MG tablet TAKE 1 TABLET EVERY DAY 90 tablet 1  . atenolol (TENORMIN) 100 MG tablet TAKE 1 TABLET (100 MG TOTAL) BY MOUTH DAILY. 90 tablet 1  . atorvastatin (LIPITOR) 10 MG tablet TAKE 1 TABLET (10 MG TOTAL) BY MOUTH DAILY. 90 tablet 1  . meclizine (MEDI-MECLIZINE) 25 MG tablet Take 1 tablet (25 mg total) by mouth 3 (three) times daily as needed for dizziness. 90 tablet 3  . traMADol (ULTRAM) 50 MG tablet TAKE 1 TABLET(50 MG) BY MOUTH EVERY 12 HOURS AS NEEDED FOR MODERATE PAIN OR SEVERE PAIN 180 tablet 1   No  facility-administered medications prior to visit.    ROS Review of Systems  Constitutional: Negative for appetite change, diaphoresis, fatigue and unexpected weight change.  HENT: Negative.   Eyes: Negative.   Respiratory: Negative for cough, chest tightness, shortness of breath and wheezing.   Cardiovascular: Negative for chest pain, palpitations and leg swelling.  Gastrointestinal: Negative for abdominal pain, constipation, diarrhea, nausea and vomiting.  Endocrine: Negative.   Genitourinary: Negative.  Negative for difficulty urinating.  Musculoskeletal: Positive for arthralgias. Negative for back pain and myalgias.  Skin: Negative for color change.  Neurological: Negative.  Negative for dizziness, weakness and headaches.  Hematological: Negative for adenopathy. Does not bruise/bleed easily.  Psychiatric/Behavioral: Negative.     Objective:  BP (!) 152/74 (BP Location: Left Arm, Patient Position: Sitting, Cuff Size: Large)   Pulse 68   Temp 98.2 F (36.8 C) (Oral)   Resp 16   Ht 5\' 4"  (1.626 m)   Wt 173 lb (78.5 kg)   SpO2 99%   BMI 29.70 kg/m   BP Readings from Last 3 Encounters:  12/30/20 (!) 152/74  05/29/20 (!) 164/73  02/29/20 (!) 160/62    Wt Readings from Last 3 Encounters:  12/30/20 173 lb (78.5 kg)  05/29/20 183 lb (83 kg)  02/29/20 183 lb (83 kg)    Physical Exam Vitals reviewed.  Constitutional:      Appearance: Normal appearance.  HENT:     Nose: Nose normal.     Mouth/Throat:     Mouth: Mucous membranes are moist.  Eyes:     General: No scleral icterus.    Conjunctiva/sclera: Conjunctivae normal.  Cardiovascular:     Rate and Rhythm: Normal rate and regular rhythm.     Heart sounds: No murmur heard.   Pulmonary:     Effort: Pulmonary effort is normal.     Breath sounds: No stridor. No wheezing, rhonchi or rales.  Abdominal:     General: Abdomen is flat.     Palpations: There is no mass.     Tenderness: There is no abdominal tenderness.  There is no guarding.     Hernia: No hernia is present.  Musculoskeletal:        General: Normal range of motion.     Cervical back: Neck supple.     Right lower leg: No edema.     Left lower leg: No edema.  Lymphadenopathy:     Cervical: No cervical adenopathy.  Skin:    General: Skin is warm and dry.  Neurological:     General: No focal deficit present.     Mental Status: She is alert.  Psychiatric:        Mood and Affect: Mood normal.        Behavior: Behavior normal.     Lab Results  Component Value Date   WBC 8.9 12/26/2019   HGB 13.5 12/26/2019   HCT 40.8 12/26/2019   PLT 202.0 12/26/2019   GLUCOSE 114 (H) 05/29/2020   CHOL 191 12/26/2019   TRIG 95.0 12/26/2019   HDL 97.60 12/26/2019   LDLDIRECT 104.5 04/25/2010   LDLCALC 74 12/26/2019   ALT 21 12/26/2019   AST 22 12/26/2019   NA 141 05/29/2020   K 3.7 05/29/2020   CL 104 05/29/2020   CREATININE 0.95 (H) 05/29/2020   BUN 18 05/29/2020   CO2 28 05/29/2020   TSH 2.66 12/26/2019   INR 0.92 06/05/2014   HGBA1C 6.4 (H) 05/29/2020    CT Head Wo Contrast  Result Date: 04/28/2019 CLINICAL DATA:  Headache and dizziness.  Fall 1 day prior. EXAM: CT HEAD WITHOUT CONTRAST TECHNIQUE: Contiguous axial images were obtained from the base of the skull through the vertex without intravenous contrast. COMPARISON:  Feb 27, 2017 FINDINGS: Brain: Mild diffuse atrophy with somewhat greater parietal lobe atrophy bilaterally is stable. There are small chronic subdural hygromas on each side with stable impression on each frontal lobe. There is no intra-axial mass. There is no acute hemorrhage evident on this study. No midline shift. No acute appearing extra-axial fluid collection. There is small vessel disease in the centra semiovale bilaterally. Tiny lacunar infarcts in the right lentiform nucleus are stable. No acute infarct is demonstrable. Vascular: No hyperdense vessel. There is calcification in each distal vertebral artery and  carotid siphon region. Skull: The bony calvarium appears intact. Sinuses/Orbits: Paranasal sinuses are clear. Orbits appear symmetric bilaterally. Other: Mastoid air cells are clear. IMPRESSION: Mild diffuse atrophy with somewhat more atrophy in the parietal lobes bilaterally than elsewhere, stable. Small bilateral subdural hygromas, stable. No new extra-axial fluid. No mass or hemorrhage. There is patchy supratentorial small vessel disease. No acute infarct evident. There are foci of arterial vascular calcification bilaterally. Electronically Signed   By: Lowella Grip III M.D.   On: 04/28/2019 13:21    Assessment & Plan:   Tearah was seen today for annual exam, hypertension, hyperlipidemia and diabetes.  Diagnoses and all orders for this visit:  Type II diabetes mellitus with manifestations (HCC)-her blood sugar is adequately well controlled. -     Hemoglobin A1c; Future -     Urinalysis, Routine w reflex microscopic; Future -     Microalbumin / creatinine urine ratio; Future  Essential hypertension, benign-her blood pressure is adequately well controlled. -     amLODipine (NORVASC) 10 MG tablet; Take 1 tablet (10 mg total) by mouth daily. -     atenolol (TENORMIN) 100 MG tablet; Take 1 tablet (100 mg total) by mouth daily. -     CBC with Differential/Platelet; Future -     Basic metabolic panel; Future -     TSH; Future -     Urinalysis, Routine w reflex microscopic; Future  Hyperlipidemia with target LDL less than 130-she has achieved her LDL goal is doing well on the statin. -     atorvastatin (LIPITOR) 10 MG tablet; Take 1 tablet (10 mg total) by mouth daily. -     Lipid panel; Future -     Hepatic function panel; Future -     TSH; Future -     CK; Future  Vertigo, benign paroxysmal, bilateral -     meclizine (MEDI-MECLIZINE) 25 MG tablet; Take 1 tablet (25 mg total) by mouth 3 (three) times daily as needed for dizziness.  Primary osteoarthritis of both knees -      Discontinue: traMADol (ULTRAM) 50 MG tablet; TAKE 1 TABLET(50 MG) BY MOUTH EVERY 12 HOURS AS NEEDED FOR MODERATE PAIN OR SEVERE PAIN -     CK; Future -     traMADol (ULTRAM) 50 MG tablet; TAKE 1 TABLET(50 MG) BY MOUTH EVERY 12 HOURS AS NEEDED FOR MODERATE PAIN OR SEVERE PAIN  Rheumatoid arthritis involving both shoulders with positive rheumatoid factor (HCC) -     Discontinue: traMADol (ULTRAM) 50 MG tablet; TAKE 1 TABLET(50 MG) BY MOUTH EVERY 12 HOURS AS NEEDED FOR MODERATE PAIN OR SEVERE PAIN -     CK; Future -     traMADol (ULTRAM) 50 MG tablet; TAKE 1 TABLET(50 MG) BY MOUTH EVERY 12 HOURS AS NEEDED FOR MODERATE PAIN OR SEVERE PAIN   I have changed Uldine M. Hancox's amLODipine. I am also having her maintain her folic acid, methotrexate, Vitamin D, fexofenadine, b complex vitamins, acetaminophen, atenolol, atorvastatin, meclizine, and traMADol.  Meds ordered this encounter  Medications  . amLODipine (NORVASC) 10 MG tablet    Sig: Take 1 tablet (10 mg total) by mouth daily.    Dispense:  90 tablet    Refill:  1  . atenolol (TENORMIN) 100 MG tablet    Sig: Take 1 tablet (100 mg total) by mouth daily.    Dispense:  90 tablet    Refill:  1  . atorvastatin (LIPITOR) 10 MG tablet    Sig: Take 1 tablet (10 mg total) by mouth daily.    Dispense:  90 tablet    Refill:  1  . meclizine (MEDI-MECLIZINE) 25 MG tablet    Sig: Take 1 tablet (25 mg total) by mouth 3 (three) times daily as needed for dizziness.    Dispense:  90 tablet    Refill:  3  . DISCONTD: traMADol (ULTRAM) 50 MG tablet    Sig: TAKE 1 TABLET(50 MG) BY MOUTH EVERY 12 HOURS AS NEEDED FOR MODERATE PAIN OR SEVERE PAIN    Dispense:  180 tablet    Refill:  1  . traMADol (ULTRAM)  50 MG tablet    Sig: TAKE 1 TABLET(50 MG) BY MOUTH EVERY 12 HOURS AS NEEDED FOR MODERATE PAIN OR SEVERE PAIN    Dispense:  180 tablet    Refill:  1     Follow-up: No follow-ups on file.  Scarlette Calico, MD

## 2020-12-31 ENCOUNTER — Telehealth: Payer: Self-pay | Admitting: Pharmacist

## 2020-12-31 NOTE — Progress Notes (Signed)
Chronic Care Management Pharmacy Assistant   Name: Briana [name]  MRN: [id number] DOB: 11-23-1930   Reason for Encounter: Initial Questions    Recent office visits:  12/30/20 Dr. Scarlette Calico, no med changes  Recent consult visits:  None ID  Hospital visits:  None in previous 6 months  Medications: Outpatient Encounter Medications as of 12/31/2020  Medication Sig  . acetaminophen (TYLENOL) 650 MG CR tablet Take 650 mg by mouth every 8 (eight) hours as needed for pain.  Marland Kitchen amLODipine (NORVASC) 10 MG tablet Take 1 tablet (10 mg total) by mouth daily.  Marland Kitchen atenolol (TENORMIN) 100 MG tablet Take 1 tablet (100 mg total) by mouth daily.  Marland Kitchen atorvastatin (LIPITOR) 10 MG tablet Take 1 tablet (10 mg total) by mouth daily.  Marland Kitchen b complex vitamins tablet Take 1 tablet by mouth daily.  . Cholecalciferol (VITAMIN D) 2000 UNITS tablet Take 2,000 Units by mouth daily.  . fexofenadine (ALLEGRA) 180 MG tablet Take 180 mg by mouth daily.  . folic acid (FOLVITE) 1 MG tablet Take 1 mg by mouth daily.  . meclizine (MEDI-MECLIZINE) 25 MG tablet Take 1 tablet (25 mg total) by mouth 3 (three) times daily as needed for dizziness.  . methotrexate (RHEUMATREX) 2.5 MG tablet Take 20 mg by mouth once a week. Caution:Chemotherapy. Protect from light.  Taken on Fridays.  . traMADol (ULTRAM) 50 MG tablet TAKE 1 TABLET(50 MG) BY MOUTH EVERY 12 HOURS AS NEEDED FOR MODERATE PAIN OR SEVERE PAIN   No facility-administered encounter medications on file as of 12/31/2020.    Care Gaps: Overdue for Covid-19 Vac, dose 3 booster Overdue Influenza Vac  Star Rating Drugs: No ACE/ARB  Have you seen any other providers since your last visit?  The patient saw Dr. Ronnald Ramp 12/30/20  Any changes in your medications or health?  The patient states that she has not had any medication or health changes  Any side effects from any medications?  The patient states that she has not had any side effects to medications  Do you  have an symptoms or problems not managed by your medications?  The patient states that she does not have any symptoms or problems not managed by medications  Any concerns about your health right now?  The patient states that she does not have any concerns about health at this time  Has your provider asked that you check blood pressure, or follow special diet at home? The patient states that she does not check her blood pressure because she believes her cuff in not accurate. She does not follow any special diet but watches what she eats  Do you get any type of exercise on a regular basis?  The patient states that she really can't exercise because of her knee pain  Can you think of a goal you would like to reach for your health?  The patient states that she does not really have a goal for her health at this time  Do you have any problems getting your medications?  The patient states that she does not have any problems with getting her medications or the cost of her medications from the pharmacy  Is there anything that you would like to discuss during the appointment?  The patient states that she does not have anything specific to discuss at this time  Patient is to receive a phone call for appointment, asked to have medications and supplements to appointment   Wendy Poet, [place] [phone]  Time spent:30 Telephone call, reviewing chart notes, and documentation

## 2021-01-01 ENCOUNTER — Other Ambulatory Visit: Payer: Self-pay

## 2021-01-01 ENCOUNTER — Ambulatory Visit (INDEPENDENT_AMBULATORY_CARE_PROVIDER_SITE_OTHER): Payer: Medicare HMO | Admitting: Pharmacist

## 2021-01-01 DIAGNOSIS — N1831 Chronic kidney disease, stage 3a: Secondary | ICD-10-CM

## 2021-01-01 DIAGNOSIS — E785 Hyperlipidemia, unspecified: Secondary | ICD-10-CM | POA: Diagnosis not present

## 2021-01-01 DIAGNOSIS — I10 Essential (primary) hypertension: Secondary | ICD-10-CM | POA: Diagnosis not present

## 2021-01-01 DIAGNOSIS — M05711 Rheumatoid arthritis with rheumatoid factor of right shoulder without organ or systems involvement: Secondary | ICD-10-CM | POA: Diagnosis not present

## 2021-01-01 DIAGNOSIS — E118 Type 2 diabetes mellitus with unspecified complications: Secondary | ICD-10-CM | POA: Diagnosis not present

## 2021-01-01 DIAGNOSIS — M05712 Rheumatoid arthritis with rheumatoid factor of left shoulder without organ or systems involvement: Secondary | ICD-10-CM

## 2021-01-01 NOTE — Progress Notes (Signed)
Chronic Care Management Pharmacy Note  01/02/2021 Name:  Briana [name] MRN:  [id number] DOB:  July 25, 1931  Subjective: Briana [name] is an 87 y.o. year old female who is a primary patient of Janith Lima, MD.  The CCM team was consulted for assistance with disease management and care coordination needs.     Engaged with patient by telephone for initial visit in response to provider referral for pharmacy case management and/or care coordination services.   Consent to Services:  The patient was given the following information about Chronic Care Management services today, agreed to services, and gave verbal consent: 1. CCM service includes personalized support from designated clinical staff supervised by the primary care provider, including individualized plan of care and coordination with other care providers 2. 24/7 contact phone numbers for assistance for urgent and routine care needs. 3. Service will only be billed when office clinical staff spend 20 minutes or more in a month to coordinate care. 4. Only one practitioner may furnish and bill the service in a calendar month. 5.The patient may stop CCM services at any time (effective at the end of the month) by phone call to the office staff. 6. The patient will be responsible for cost sharing (co-pay) of up to 20% of the service fee (after annual deductible is met). Patient agreed to services and consent obtained.  Patient Care Team: Janith Lima, MD as PCP - General (Internal Medicine) Gavin Pound, MD as Consulting Physician (Rheumatology) Clent Jacks, MD as Consulting Physician (Ophthalmology) Gatha Mayer, MD as Consulting Physician (Gastroenterology) Charlton Haws, [facility] as Pharmacist (Pharmacist)  Recent office visits: 12/30/20 Dr Ronnald Ramp OV: CPX. GFR worse than 1 year ago, MACR normal, lipids, LFTs, TSH, CK all WNL. No med changes.  Recent consult visits: 11/11/20 Dr Katy Fitch (ophthalmology): f/u glaucoma  08/15/20 Dr  Pearline Cables (rheumatology): f/u [facility] visits: None in previous 6 months  Objective:  Lab Results  Component Value Date   CREATININE 0.95 12/30/2020   BUN 18 12/30/2020   GFR 53.94 (L) 12/30/2020   GFRNONAA 54 (L) 05/29/2020   GFRAA 63 05/29/2020   NA 142 12/30/2020   K 3.6 12/30/2020   CALCIUM 9.6 12/30/2020   CO2 29 12/30/2020   GLUCOSE 124 (H) 12/30/2020    Lab Results  Component Value Date/Time   HGBA1C 6.3 12/30/2020 10:31 AM   HGBA1C 6.4 (H) 05/29/2020 10:43 AM   GFR 53.94 (L) 12/30/2020 10:31 AM   GFR 62.84 12/26/2019 10:53 AM   MICROALBUR 6.3 (H) 12/30/2020 10:31 AM    Last diabetic Eye exam:  Lab Results  Component Value Date/Time   HMDIABEYEEXA No Retinopathy 09/30/2020 12:00 AM    Last diabetic Foot exam: No results found for: HMDIABFOOTEX   Lab Results  Component Value Date   CHOL 193 12/30/2020   HDL 108.80 12/30/2020   LDLCALC 72 12/30/2020   LDLDIRECT 104.5 04/25/2010   TRIG 61.0 12/30/2020   CHOLHDL 2 12/30/2020    Hepatic Function Latest Ref Rng & Units 12/30/2020 12/26/2019 12/15/2018  Total Protein 6.0 - 8.3 g/dL 7.3 7.4 7.0  Albumin 3.5 - 5.2 g/dL 4.2 4.0 4.2  AST 0 - 37 U/L 21 22 20   ALT 0 - 35 U/L 25 21 18   Alk Phosphatase 39 - 117 U/L 108 109 87  Total Bilirubin 0.2 - 1.2 mg/dL 1.0 0.7 0.6  Bilirubin, Direct 0.0 - 0.3 mg/dL 0.1 0.2 -    Lab Results  Component Value Date/Time  TSH 1.84 12/30/2020 10:31 AM   TSH 2.66 12/26/2019 10:53 AM    CBC Latest Ref Rng & Units 12/30/2020 12/26/2019 12/15/2018  WBC 4.0 - 10.5 K/uL 8.2 8.9 8.1  Hemoglobin 12.0 - 15.0 g/dL 13.0 13.5 13.3  Hematocrit 36.0 - 46.0 % 37.8 40.8 38.9  Platelets 150.0 - 400.0 K/uL 190.0 202.0 183.0    No results found for: VD25OH  Clinical ASCVD: No  The ASCVD Risk score Mikey Bussing DC Jr., et al., 2013) failed to calculate for the following reasons:   The 2013 ASCVD risk score is only valid for ages 52 to 22    Depression screen PHQ 2/9 07/11/2020 06/20/2019 12/16/2018   Decreased Interest 0 0 0  Down, Depressed, Hopeless 0 0 0  PHQ - 2 Score 0 0 0  Altered sleeping - - -  Tired, decreased energy - - -  Change in appetite - - -  Feeling bad or failure about yourself  - - -  Trouble concentrating - - -  Moving slowly or fidgety/restless - - -  Suicidal thoughts - - -  PHQ-9 Score - - -  Difficult doing work/chores - - -      Social History   Tobacco Use  Smoking Status Never Smoker  Smokeless Tobacco Never Used   BP Readings from Last 3 Encounters:  12/30/20 (!) 152/74  05/29/20 (!) 164/73  02/29/20 (!) 160/62   Pulse Readings from Last 3 Encounters:  12/30/20 68  05/29/20 61  02/29/20 (!) 59   Wt Readings from Last 3 Encounters:  12/30/20 173 lb (78.5 kg)  05/29/20 183 lb (83 kg)  02/29/20 183 lb (83 kg)   BMI Readings from Last 3 Encounters:  12/30/20 29.70 kg/m  05/29/20 31.41 kg/m  02/29/20 31.41 kg/m    Assessment/Interventions: Review of patient past medical history, allergies, medications, health status, including review of consultants reports, laboratory and other test data, was performed as part of comprehensive evaluation and provision of chronic care management services.   SDOH:  (Social Determinants of Health) assessments and interventions performed: Yes   SDOH Screenings   Alcohol Screen: Low Risk   . Last Alcohol Screening Score (AUDIT): 0  Depression (PHQ2-9): Low Risk   . PHQ-2 Score: 0  Financial Resource Strain: Low Risk   . Difficulty of Paying Living Expenses: Not hard at all  Food Insecurity: No Food Insecurity  . Worried About Charity fundraiser in the Last Year: Never true  . Ran Out of Food in the Last Year: Never true  Housing: Low Risk   . Last Housing Risk Score: 0  Physical Activity: Sufficiently Active  . Days of Exercise per Week: 4 days  . Minutes of Exercise per Session: 60 min  Social Connections: Unknown  . Frequency of Communication with Friends and Family: More than three times a  week  . Frequency of Social Gatherings with Friends and Family: More than three times a week  . Attends Religious Services: Patient refused  . Active Member of Clubs or Organizations: Patient refused  . Attends Archivist Meetings: Patient refused  . Marital Status: Widowed  Stress: No Stress Concern Present  . Feeling of Stress : Not at all  Tobacco Use: Low Risk   . Smoking Tobacco Use: Never Smoker  . Smokeless Tobacco Use: Never Used  Transportation Needs: No Transportation Needs  . Lack of Transportation (Medical): No  . Lack of Transportation (Non-Medical): No     CCM Care  Plan  Allergies  Allergen Reactions  . Ibuprofen Anaphylaxis  . Percocet [Oxycodone-Acetaminophen]     nausea  . Simvastatin Other (See Comments)    muscle aches  . Diltiazem Hcl     REACTION: ANGIO EDEMA  . Hydrochlorothiazide     REACTION: SWELLING  . Lisinopril     REACTION: SWELLING  . Verapamil     REACTION: SWELLING    Medications Reviewed Today    Reviewed by Charlton Haws, [facility] (Pharmacist) on 01/01/21 at 1228  Med List Status: <None>  Medication Order Taking? Sig Documenting Provider Last Dose Status Informant  acetaminophen (TYLENOL) 650 MG CR tablet [id number] Yes Take 650 mg by mouth every 8 (eight) hours as needed for pain. [provider] Taking Active Self  amLODipine (NORVASC) 10 MG tablet [id number] Yes Take 1 tablet (10 mg total) by mouth daily. Janith Lima, MD Taking Active   atenolol (TENORMIN) 100 MG tablet [id number] Yes Take 1 tablet (100 mg total) by mouth daily. Janith Lima, MD Taking Active   atorvastatin (LIPITOR) 10 MG tablet [id number] Yes Take 1 tablet (10 mg total) by mouth daily. Janith Lima, MD Taking Active   b complex vitamins tablet [id number] Yes Take 1 tablet by mouth daily. [provider] Taking Active Self  Cholecalciferol (VITAMIN D) 2000 UNITS tablet [id number] Yes Take 2,000 Units by mouth daily. [provider] Taking Active Self  fexofenadine (ALLEGRA) 180 MG tablet [id number] Yes Take 180 mg by mouth daily. [provider] Taking Active Self  folic acid (FOLVITE) 1 MG tablet [id number] Yes Take 1 mg by mouth daily. [provider] Taking Active Self  meclizine (MEDI-MECLIZINE) 25 MG tablet [id number] Yes Take 1 tablet (25 mg total) by mouth 3 (three) times daily as needed for dizziness. Janith Lima, MD Taking Active   methotrexate [facility]) 2.5 MG tablet [id number] Yes Take 15 mg by mouth once a week. Caution:Chemotherapy. Protect from light.  6 tablets Taken on Fridays. [provider] Taking Active Self  traMADol (ULTRAM) 50 MG tablet [id number] Yes TAKE 1 TABLET(50 MG) BY MOUTH EVERY 12 HOURS AS NEEDED FOR MODERATE PAIN OR SEVERE PAIN Janith Lima, MD Taking Active           Patient Active Problem List   Diagnosis Date Noted  . Stage 3b chronic kidney disease ([place]) 05/30/2020  . Candida infection, oral 02/29/2020  . Type II diabetes mellitus with manifestations ([place]) 12/28/2019  . Vertigo, benign paroxysmal, bilateral 08/15/2019  . Left lumbar radiculitis 03/07/2017  . Essential hypertension, benign 06/19/2014  . RA (rheumatoid arthritis) ([place]) 06/19/2014  . Constipation due to opioid therapy 06/19/2014  . Routine health maintenance 05/30/2012  . INSOMNIA UNSPECIFIED 10/31/2007  . Hyperlipidemia with target LDL less than 130 10/28/2007  . Seasonal allergic rhinitis 10/28/2007  . DJD (degenerative joint disease) of knee 10/28/2007    Immunization History  Administered Date(s) Administered  . Influenza Split 07/03/2012  . Influenza, High Dose Seasonal PF 07/24/2013, 07/15/2018, 07/15/2020  . Influenza,inj,Quad PF,6+ Mos 08/06/2014  . Influenza-Unspecified 07/28/2016, 07/12/2017, 07/18/2019  . Moderna Sars-Covid-2 Vaccination 10/24/2019, 11/21/2019  . Pneumococcal Conjugate-13 10/23/2015  . Pneumococcal Polysaccharide-23 04/19/2014,  12/30/2020  . Td 05/30/2012  . Zoster 07/13/2015    Conditions to be addressed/monitored:  Hypertension, Hyperlipidemia, Diabetes, Chronic Kidney Disease and Rheumatoid arthritis  Care Plan : [place]  Updates made by Charlton Haws, [place] since 01/02/2021 12:00 AM    Problem: Hypertension, Hyperlipidemia,  Diabetes, Chronic Kidney Disease and Rheumatoid arthritis   Priority: High    Long-Range Goal: Disease management   Start Date: 01/02/2021  Expected End Date: 07/05/2021  This Visit's Progress: On track  Priority: High  Note:   Current Barriers:  . Unable to independently monitor therapeutic efficacy  Pharmacist Clinical Goal(s):  Marland Kitchen Patient will achieve adherence to monitoring guidelines and medication adherence to achieve therapeutic efficacy through collaboration with PharmD and provider.   Interventions: . 1:1 collaboration with Janith Lima, MD regarding development and update of comprehensive plan of care as evidenced by provider attestation and co-signature . Inter-disciplinary care team collaboration (see longitudinal plan of care) . Comprehensive medication review performed; medication list updated in electronic medical record  Hypertension / CKD Stage 3a (BP goal <140/90) -Not ideally controlled - recent BP in office was above goal, pt has not been able to check at home due to BP monitor malfunction. She is planning on getting a new meter. -Current treatment: . Amlodipine 10 mg daily AM . Atenolol 100 mg daily AM -Current home readings: unknown -Denies hypotensive/hypertensive symptoms -Educated on BP goals and benefits of medications for prevention of heart attack, stroke and kidney damage; Importance of home blood pressure monitoring; Proper BP monitoring technique; -Counseled to monitor BP at home 3 times a week, document, and provide log at future appointments -Recommended to continue current medication  Hyperlipidemia: (LDL goal <  100) -Controlled - LDL at goal -Current treatment: . Atorvastatin 10 mg daily HS -Educated on Cholesterol goals;  Benefits of statin for ASCVD risk reduction; -Recommended to continue current medication  Diabetes (A1c goal <7%) -Diet-controlled -Denies hypoglycemic/hyperglycemic symptoms -Educated on A1c and blood sugar goals; -Counseled on diet and exercise extensively  Rheumatoid arthritis (Goal: manage pain) -Controlled - patient sees rheumatologist PA Marella Chimes periodically, recently had methotrexate reduce to 6 tabs (15 mg) -Current treatment  . Methotrexate 2.5 mg - take 15 mg (6 tab) once a week . Folic acid 1 mg daily - 3 tab daily . Tramadol 50 mg q12 hr PRN . Tylenol 650 mg PRN -Counseled on importance of folic acid supplementation while taking methotrexate  Recommend to continue current medication  Glaucoma (Goal: maintain IOP at goal) -Controlled - pt sees ophthalmologist Dr Katy Fitch periodically -Current treatment  . Dorzolamide-timolol eye drops . Latanoprost 0.005% eye drops . Refresh eye drops -Recommended to continue current medication  Health Maintenance -Vaccine gaps: covid booster -Current therapy:  Marland Kitchen Vitamin D 2000 IU daily . Vitamin B complex . Fexofenadine 180 mg daily - year round . Meclizine 25 mg TID prn (vertigo) -Patient is satisfied with current therapy and denies issues -Recommended to continue current medication  Patient Goals/Self-Care Activities . Patient will:  - take medications as prescribed focus on medication adherence by pill box check blood pressure 3 times weekly, document, and provide at future appointments  Follow Up Plan: Telephone follow up appointment with care management team member scheduled for: 6 months      Medication Assistance: None required.  Patient affirms current coverage meets needs.  Patient's preferred pharmacy is:  [place], [place] [address] Phone: [phone] Fax: [phone]  [facility] [place], [address] [place] [address] Phone: [phone] Fax: [phone]  Uses pill box? Yes Pt endorses 100% compliance  We discussed: Current  pharmacy is preferred with insurance plan and patient is satisfied with pharmacy services Patient decided to: Continue current medication management strategy  Care Plan and Follow Up Patient Decision:  Patient agrees to Care Plan and Follow-up.  Plan: Telephone follow up appointment with care management team member scheduled for:  6 months  Charlene Brooke, PharmD, [place], CPP Clinical Pharmacist [facility] [phone]

## 2021-01-02 DIAGNOSIS — H401113 Primary open-angle glaucoma, right eye, severe stage: Secondary | ICD-10-CM | POA: Diagnosis not present

## 2021-01-02 DIAGNOSIS — H401121 Primary open-angle glaucoma, left eye, mild stage: Secondary | ICD-10-CM | POA: Diagnosis not present

## 2021-01-02 NOTE — Assessment & Plan Note (Signed)
Exam completed Labs reviewed Vaccines reviewed No cancer screenings are indicated Patient education was given.

## 2021-01-02 NOTE — Patient Instructions (Addendum)
Visit Information  Phone number for Pharmacist: [phone]  Thank you for meeting with me to discuss your medications! I look forward to working with you to achieve your health care goals. Below is a summary of what we talked about during the visit:  Goals Addressed            This Visit's Progress   . Manage My Medicine       Timeframe:  Long-Range Goal Priority:  Medium Start Date:     01/01/21                        Expected End Date:  01/01/22                      Follow Up Date 07/11/21   - call for medicine refill 2 or 3 days before it runs out - call if I am sick and can't take my medicine - keep a list of all the medicines I take; vitamins and herbals too - use a pillbox to sort medicine    Why is this important?   . These steps will help you keep on track with your medicines.   Notes:     . Track and Manage My Blood Pressure-Hypertension       Timeframe:  Long-Range Goal Priority:  High Start Date:       01/01/21                      Expected End Date:   07/11/21                    Follow Up Date 04/10/21   - check blood pressure 3 times per week - choose a place to take my blood pressure (home, clinic or office, retail store) - write blood pressure results in a log or diary    Why is this important?    You won't feel high blood pressure, but it can still hurt your blood vessels.   High blood pressure can cause heart or kidney problems. It can also cause a stroke.   Making lifestyle changes like losing a little weight or eating less salt will help.   Checking your blood pressure at home and at different times of the day can help to control blood pressure.   If the doctor prescribes medicine remember to take it the way the doctor ordered.   Call the office if you cannot afford the medicine or if there are questions about it.     Notes:       Patient Care Plan: CCM Pharmacy Care Plan    Problem Identified: Hypertension, Hyperlipidemia, Diabetes,  Chronic Kidney Disease and Rheumatoid arthritis   Priority: High    Long-Range Goal: Disease management   Start Date: 01/02/2021  Expected End Date: 07/05/2021  This Visit's Progress: On track  Priority: High  Note:   Current Barriers:  . Unable to independently monitor therapeutic efficacy  Pharmacist Clinical Goal(s):  Marland Kitchen Patient will achieve adherence to monitoring guidelines and medication adherence to achieve therapeutic efficacy through collaboration with PharmD and provider.   Interventions: . 1:1 collaboration with Janith Lima, MD regarding development and update of comprehensive plan of care as evidenced by provider attestation and co-signature . Inter-disciplinary care team collaboration (see longitudinal plan of care) . Comprehensive medication review performed; medication list updated in electronic medical record  Hypertension / CKD Stage 3a (BP  goal <140/90) -Not ideally controlled - recent BP in office was above goal, pt has not been able to check at home due to BP monitor malfunction. She is planning on getting a new meter. -Current treatment: . Amlodipine 10 mg daily AM . Atenolol 100 mg daily AM -Current home readings: unknown -Denies hypotensive/hypertensive symptoms -Educated on BP goals and benefits of medications for prevention of heart attack, stroke and kidney damage; Importance of home blood pressure monitoring; Proper BP monitoring technique; -Counseled to monitor BP at home 3 times a week, document, and provide log at future appointments -Recommended to continue current medication  Hyperlipidemia: (LDL goal < 100) -Controlled - LDL at goal -Current treatment: . Atorvastatin 10 mg daily HS -Educated on Cholesterol goals;  Benefits of statin for ASCVD risk reduction; -Recommended to continue current medication  Diabetes (A1c goal <7%) -Diet-controlled -Denies hypoglycemic/hyperglycemic symptoms -Educated on A1c and blood sugar goals; -Counseled on  diet and exercise extensively  Rheumatoid arthritis (Goal: manage pain) -Controlled - patient sees rheumatologist PA Marella Chimes periodically, recently had methotrexate reduce to 6 tabs (15 mg) -Current treatment  . Methotrexate 2.5 mg - take 15 mg (6 tab) once a week . Folic acid 1 mg daily - 3 tab daily . Tramadol 50 mg q12 hr PRN . Tylenol 650 mg PRN -Counseled on importance of folic acid supplementation while taking methotrexate  Recommend to continue current medication  Glaucoma (Goal: maintain IOP at goal) -Controlled - pt sees ophthalmologist Dr Katy Fitch periodically -Current treatment  . Dorzolamide-timolol eye drops . Latanoprost 0.005% eye drops . Refresh eye drops -Recommended to continue current medication  Health Maintenance -Vaccine gaps: covid booster -Current therapy:  Marland Kitchen Vitamin D 2000 IU daily . Vitamin B complex . Fexofenadine 180 mg daily - year round . Meclizine 25 mg TID prn (vertigo) -Patient is satisfied with current therapy and denies issues -Recommended to continue current medication  Patient Goals/Self-Care Activities . Patient will:  - take medications as prescribed focus on medication adherence by pill box check blood pressure 3 times weekly, document, and provide at future appointments  Follow Up Plan: Telephone follow up appointment with care management team member scheduled for: 6 months      Briana [name] was given information about Chronic Care Management services today including:  1. CCM service includes personalized support from designated clinical staff supervised by her physician, including individualized plan of care and coordination with other care providers 2. 24/7 contact phone numbers for assistance for urgent and routine care needs. 3. Standard insurance, coinsurance, copays and deductibles apply for chronic care management only during months in which we provide at least 20 minutes of these services. Most insurances cover these services  at 100%, however patients may be responsible for any copay, coinsurance and/or deductible if applicable. This service may help you avoid the need for more expensive face-to-face services. 4. Only one practitioner may furnish and bill the service in a calendar month. 5. The patient may stop CCM services at any time (effective at the end of the month) by phone call to the office staff.  Patient agreed to services and verbal consent obtained.   The patient verbalized understanding of instructions, educational materials, and care plan provided today and agreed to receive a mailed copy of patient instructions, educational materials, and care plan.  Telephone follow up appointment with pharmacy team member scheduled for: [place], PharmD, [facility] Clinical Pharmacist [facility] [phone]  How to Take  Your Blood Pressure Blood pressure is a measurement of how strongly your blood is pressing against the walls of your arteries. Arteries are blood vessels that carry blood from your heart throughout your body. Your health care provider takes your blood pressure at each office visit. You can also take your own blood pressure at home with a blood pressure monitor. You may need to take your own blood pressure to:  Confirm a diagnosis of high blood pressure (hypertension).  Monitor your blood pressure over time.  Make sure your blood pressure medicine is working. Supplies needed:  Blood pressure monitor.  Dining room chair to sit in.  Table or desk.  Small notebook and pencil or pen. How to prepare To get the most accurate reading, avoid the following for 30 minutes before you check your blood pressure:  Drinking caffeine.  Drinking alcohol.  Eating.  Smoking.  Exercising. Five minutes before you check your blood pressure:  Use the bathroom and urinate so that you have an empty bladder.  Sit quietly in a dining room chair. Do not sit in a  soft couch or an armchair. Do not talk. How to take your blood pressure To check your blood pressure, follow the instructions in the manual that came with your blood pressure monitor. If you have a digital blood pressure monitor, the instructions may be as follows: 1. Sit up straight in a chair. 2. Place your feet on the floor. Do not cross your ankles or legs. 3. Rest your left arm at the level of your heart on a table or desk or on the arm of a chair. 4. Pull up your shirt sleeve. 5. Wrap the blood pressure cuff around the upper part of your left arm, 1 inch (2.5 cm) above your elbow. It is best to wrap the cuff around bare skin. 6. Fit the cuff snugly around your arm. You should be able to place only one finger between the cuff and your arm. 7. Position the cord so that it rests in the bend of your elbow. 8. Press the power button. 9. Sit quietly while the cuff inflates and deflates. 10. Read the digital reading on the monitor screen and write the numbers down (record them) in a notebook. 11. Wait 2-3 minutes, then repeat the steps, starting at step 1.   What does my blood pressure reading mean? A blood pressure reading consists of a higher number over a lower number. Ideally, your blood pressure should be below 120/80. The first ("top") number is called the systolic pressure. It is a measure of the pressure in your arteries as your heart beats. The second ("bottom") number is called the diastolic pressure. It is a measure of the pressure in your arteries as the heart relaxes. Blood pressure is classified into five stages. The following are the stages for adults who do not have a short-term serious illness or a chronic condition. Systolic pressure and diastolic pressure are measured in a unit called mm Hg (millimeters of mercury).  Normal  Systolic pressure: below 226.  Diastolic pressure: below 80. Elevated  Systolic pressure: 333-545.  Diastolic pressure: below 80. Hypertension stage  1  Systolic pressure: 625-638.  Diastolic pressure: 93-73. Hypertension stage 2  Systolic pressure: 428 or above.  Diastolic pressure: 90 or above. You can have elevated blood pressure or hypertension even if only the systolic or only the diastolic number in your reading is higher than normal. Follow these instructions at home:  Check your blood pressure as  often as recommended by your health care provider.  Check your blood pressure at the same time every day.  Take your monitor to the next appointment with your health care provider to make sure that: ? You are using it correctly. ? It provides accurate readings.  Be sure you understand what your goal blood pressure numbers are.  Tell your health care provider if you are having any side effects from blood pressure medicine.  Keep all follow-up visits as told by your health care provider. This is important. General tips  Your health care provider can suggest a reliable monitor that will meet your needs. There are several types of home blood pressure monitors.  Choose a monitor that has an arm cuff. Do not choose a monitor that measures your blood pressure from your wrist or finger.  Choose a cuff that wraps snugly around your upper arm. You should be able to fit only one finger between your arm and the cuff.  You can buy a blood pressure monitor at most drugstores or online. Where to find more information American Heart Association: www.heart.org Contact a health care provider if:  Your blood pressure is consistently high. Get help right away if:  Your systolic blood pressure is higher than 180.  Your diastolic blood pressure is higher than 120. Summary  Blood pressure is a measurement of how strongly your blood is pressing against the walls of your arteries.  A blood pressure reading consists of a higher number over a lower number. Ideally, your blood pressure should be below 120/80.  Check your blood pressure at  the same time every day.  Avoid caffeine, alcohol, smoking, and exercise for 30 minutes prior to checking your blood pressure. These agents can affect the accuracy of the blood pressure reading. This information is not intended to replace advice given to you by your health care provider. Make sure you discuss any questions you have with your health care provider. Document Revised: 09/22/2019 Document Reviewed: 09/22/2019 Elsevier Patient Education  2021 [employer].

## 2021-02-20 DIAGNOSIS — M150 Primary generalized (osteo)arthritis: Secondary | ICD-10-CM | POA: Diagnosis not present

## 2021-02-20 DIAGNOSIS — Z79899 Other long term (current) drug therapy: Secondary | ICD-10-CM | POA: Diagnosis not present

## 2021-02-20 DIAGNOSIS — M25512 Pain in left shoulder: Secondary | ICD-10-CM | POA: Diagnosis not present

## 2021-02-20 DIAGNOSIS — M0609 Rheumatoid arthritis without rheumatoid factor, multiple sites: Secondary | ICD-10-CM | POA: Diagnosis not present

## 2021-02-20 DIAGNOSIS — M25511 Pain in right shoulder: Secondary | ICD-10-CM | POA: Diagnosis not present

## 2021-02-20 DIAGNOSIS — Z6829 Body mass index (BMI) 29.0-29.9, adult: Secondary | ICD-10-CM | POA: Diagnosis not present

## 2021-02-20 DIAGNOSIS — M65332 Trigger finger, left middle finger: Secondary | ICD-10-CM | POA: Diagnosis not present

## 2021-02-20 DIAGNOSIS — M25531 Pain in right wrist: Secondary | ICD-10-CM | POA: Diagnosis not present

## 2021-02-20 DIAGNOSIS — M2550 Pain in unspecified joint: Secondary | ICD-10-CM | POA: Diagnosis not present

## 2021-03-12 ENCOUNTER — Telehealth: Payer: Self-pay | Admitting: Pharmacist

## 2021-03-12 NOTE — Progress Notes (Signed)
Chronic Care Management Pharmacy Assistant   Name: Briana [name]  MRN: [id number] DOB: [birth year]  Reason for Encounter: Disease State-Hypertension   Recent office visits:  None noted.  Recent consult visits:  01/02/21 [facility] (Ophthalmology) - Glaucoma right severe, left mild.  Hospital visits:  None in previous 6 months  Medications: Outpatient Encounter Medications as of 03/12/2021  Medication Sig  . acetaminophen (TYLENOL) 650 MG CR tablet Take 650 mg by mouth every 8 (eight) hours as needed for pain.  Marland Kitchen amLODipine (NORVASC) 10 MG tablet Take 1 tablet (10 mg total) by mouth daily.  Marland Kitchen atenolol (TENORMIN) 100 MG tablet Take 1 tablet (100 mg total) by mouth daily.  Marland Kitchen atorvastatin (LIPITOR) 10 MG tablet Take 1 tablet (10 mg total) by mouth daily.  Marland Kitchen b complex vitamins tablet Take 1 tablet by mouth daily.  . Cholecalciferol (VITAMIN D) 2000 UNITS tablet Take 2,000 Units by mouth daily.  . fexofenadine (ALLEGRA) 180 MG tablet Take 180 mg by mouth daily.  . folic acid (FOLVITE) 1 MG tablet Take 1 mg by mouth daily.  . meclizine (MEDI-MECLIZINE) 25 MG tablet Take 1 tablet (25 mg total) by mouth 3 (three) times daily as needed for dizziness.  . methotrexate (RHEUMATREX) 2.5 MG tablet Take 15 mg by mouth once a week. Caution:Chemotherapy. Protect from light.  6 tablets Taken on Fridays.  . traMADol (ULTRAM) 50 MG tablet TAKE 1 TABLET(50 MG) BY MOUTH EVERY 12 HOURS AS NEEDED FOR MODERATE PAIN OR SEVERE PAIN   No facility-administered encounter medications on file as of 03/12/2021.    Reviewed chart prior to disease state call. Spoke with patient regarding BP  Recent Office Vitals: BP Readings from Last 3 Encounters:  12/30/20 (!) 152/74  05/29/20 (!) 164/73  02/29/20 (!) 160/62   Pulse Readings from Last 3 Encounters:  12/30/20 68  05/29/20 61  02/29/20 (!) 59    Wt Readings from Last 3 Encounters:  12/30/20 173 lb (78.5 kg)  05/29/20 183 lb (83 kg)  02/29/20 183 lb (83 kg)      Kidney Function Lab Results  Component Value Date/Time   CREATININE 0.95 12/30/2020 10:31 AM   CREATININE 0.95 (H) 05/29/2020 10:43 AM   CREATININE 1.01 12/26/2019 10:53 AM   GFR 53.94 (L) 12/30/2020 10:31 AM   GFRNONAA 54 (L) 05/29/2020 10:43 AM   GFRAA 63 05/29/2020 10:43 AM    BMP Latest Ref Rng & Units 12/30/2020 05/29/2020 12/26/2019  Glucose 70 - 99 mg/dL 124(H) 114(H) 133(H)  BUN 6 - 23 mg/dL 18 18 21   Creatinine 0.40 - 1.20 mg/dL 0.95 0.95(H) 1.01  BUN/Creat Ratio 6 - 22 (calc) - 19 -  Sodium 135 - 145 mEq/L 142 141 140  Potassium 3.5 - 5.1 mEq/L 3.6 3.7 3.9  Chloride 96 - 112 mEq/L 103 104 103  CO2 19 - 32 mEq/L 29 28 30   Calcium 8.4 - 10.5 mg/dL 9.6 9.4 9.5    . Current antihypertensive regimen:   Amlodipine 10 mg daily AM  Atenolol 100 mg daily AM  . How often are you checking your Blood Pressure? Patient does not have a home monitor and states she is going to ask her daughter to bring hers over to take her BP.   Current home BP readings: N/A  . What recent interventions/DTPs have been made by any provider to improve Blood Pressure control since last CPP Visit:   None noted  . Any recent hospitalizations or ED visits since last  visit with CPP? Yes   . What diet changes have been made to improve Blood Pressure Control?   Patient states she's not on any diets but watches what she  eats.  . What exercise is being done to improve your Blood Pressure Control?  o Patient states she started walking.  Adherence Review: Is the patient currently on ACE/ARB medication? Yes Does the patient have >5 day gap between last estimated fill dates? No  Amlodipine - last fill 01/06/21 90D  Atenolol - last fill 01/06/21 90D  Star Rating Drugs: Atorvastatin - last fill 01/06/21  Orinda Kenner, RMA Clinical Pharmacists Assistant [phone]  Time Spent: 49

## 2021-05-22 DIAGNOSIS — M150 Primary generalized (osteo)arthritis: Secondary | ICD-10-CM | POA: Diagnosis not present

## 2021-05-22 DIAGNOSIS — M0609 Rheumatoid arthritis without rheumatoid factor, multiple sites: Secondary | ICD-10-CM | POA: Diagnosis not present

## 2021-05-22 DIAGNOSIS — M2550 Pain in unspecified joint: Secondary | ICD-10-CM | POA: Diagnosis not present

## 2021-05-22 DIAGNOSIS — R5381 Other malaise: Secondary | ICD-10-CM | POA: Diagnosis not present

## 2021-05-22 DIAGNOSIS — E663 Overweight: Secondary | ICD-10-CM | POA: Diagnosis not present

## 2021-05-22 DIAGNOSIS — Z79899 Other long term (current) drug therapy: Secondary | ICD-10-CM | POA: Diagnosis not present

## 2021-05-22 DIAGNOSIS — R768 Other specified abnormal immunological findings in serum: Secondary | ICD-10-CM | POA: Diagnosis not present

## 2021-05-22 DIAGNOSIS — R5383 Other fatigue: Secondary | ICD-10-CM | POA: Diagnosis not present

## 2021-05-22 DIAGNOSIS — Z6828 Body mass index (BMI) 28.0-28.9, adult: Secondary | ICD-10-CM | POA: Diagnosis not present

## 2021-06-02 ENCOUNTER — Other Ambulatory Visit: Payer: Self-pay | Admitting: Internal Medicine

## 2021-06-02 DIAGNOSIS — E785 Hyperlipidemia, unspecified: Secondary | ICD-10-CM

## 2021-06-02 DIAGNOSIS — I10 Essential (primary) hypertension: Secondary | ICD-10-CM

## 2021-06-19 DIAGNOSIS — Z1231 Encounter for screening mammogram for malignant neoplasm of breast: Secondary | ICD-10-CM | POA: Diagnosis not present

## 2021-06-19 LAB — HM MAMMOGRAPHY

## 2021-07-02 DIAGNOSIS — H10413 Chronic giant papillary conjunctivitis, bilateral: Secondary | ICD-10-CM | POA: Diagnosis not present

## 2021-07-02 DIAGNOSIS — H04123 Dry eye syndrome of bilateral lacrimal glands: Secondary | ICD-10-CM | POA: Diagnosis not present

## 2021-07-02 DIAGNOSIS — Z961 Presence of intraocular lens: Secondary | ICD-10-CM | POA: Diagnosis not present

## 2021-07-02 DIAGNOSIS — H26493 Other secondary cataract, bilateral: Secondary | ICD-10-CM | POA: Diagnosis not present

## 2021-07-02 DIAGNOSIS — H43813 Vitreous degeneration, bilateral: Secondary | ICD-10-CM | POA: Diagnosis not present

## 2021-07-02 DIAGNOSIS — H401113 Primary open-angle glaucoma, right eye, severe stage: Secondary | ICD-10-CM | POA: Diagnosis not present

## 2021-07-02 DIAGNOSIS — H401121 Primary open-angle glaucoma, left eye, mild stage: Secondary | ICD-10-CM | POA: Diagnosis not present

## 2021-07-03 DIAGNOSIS — R921 Mammographic calcification found on diagnostic imaging of breast: Secondary | ICD-10-CM | POA: Diagnosis not present

## 2021-07-03 DIAGNOSIS — R922 Inconclusive mammogram: Secondary | ICD-10-CM | POA: Diagnosis not present

## 2021-07-03 DIAGNOSIS — R928 Other abnormal and inconclusive findings on diagnostic imaging of breast: Secondary | ICD-10-CM | POA: Diagnosis not present

## 2021-07-03 LAB — HM MAMMOGRAPHY

## 2021-07-07 ENCOUNTER — Ambulatory Visit (INDEPENDENT_AMBULATORY_CARE_PROVIDER_SITE_OTHER): Payer: Medicare HMO | Admitting: Pharmacist

## 2021-07-07 ENCOUNTER — Other Ambulatory Visit: Payer: Self-pay

## 2021-07-07 DIAGNOSIS — N1831 Chronic kidney disease, stage 3a: Secondary | ICD-10-CM

## 2021-07-07 DIAGNOSIS — E118 Type 2 diabetes mellitus with unspecified complications: Secondary | ICD-10-CM

## 2021-07-07 DIAGNOSIS — M05711 Rheumatoid arthritis with rheumatoid factor of right shoulder without organ or systems involvement: Secondary | ICD-10-CM

## 2021-07-07 DIAGNOSIS — E785 Hyperlipidemia, unspecified: Secondary | ICD-10-CM

## 2021-07-07 DIAGNOSIS — I10 Essential (primary) hypertension: Secondary | ICD-10-CM

## 2021-07-07 NOTE — Progress Notes (Signed)
Chronic Care Management Pharmacy Note  07/07/2021 Name:  Briana [name] MRN:  [id number] DOB:  1931/04/25  Summary: -Breast biopsy scheduled 10/12 -Rheumatology started hydroxychloroquine 200 mg BID 06/03/21 -Pt [facility] She does not have vaccine card readily accessible during call.  Recommendations/Changes made from today's visit: -Advised to get flu vaccine   Subjective: Briana [name] is an 87 y.o. year old female who is a primary patient of Janith Lima, MD.  The CCM team was consulted for assistance with disease management and care coordination needs.     Engaged with patient by telephone for follow up visit in response to provider referral for pharmacy case management and/or care coordination services.   Consent to Services:  The patient was given information about Chronic Care Management services, agreed to services, and gave verbal consent prior to initiation of services.  Please see initial visit note for detailed documentation.   Patient Care Team: Janith Lima, MD as PCP - General (Internal Medicine) Gavin Pound, MD as Consulting Physician (Rheumatology) Clent Jacks, MD as Consulting Physician (Ophthalmology) Gatha Mayer, MD as Consulting Physician (Gastroenterology) Charlton Haws, [facility] as Pharmacist (Pharmacist)  Recent office visits: 12/30/20 Dr Ronnald Ramp OV: CPX. GFR worse than 1 year ago, MACR normal, lipids, LFTs, TSH, CK all WNL. No med changes.  Recent consult visits: 02/20/21 Dr Pearline Cables (rheumatology): f/u RA 01/02/21 Dr Katy Fitch (ophthalmology) f/u glaucoma 11/11/20 Dr Katy Fitch (ophthalmology): f/u glaucoma 08/15/20 Dr Pearline Cables (rheumatology): f/u [facility] visits: None in previous 6 months  Objective:  Lab Results  Component Value Date   CREATININE 0.95 12/30/2020   BUN 18 12/30/2020   GFR 53.94 (L) 12/30/2020   GFRNONAA 54 (L) 05/29/2020   GFRAA 63 05/29/2020   NA 142 12/30/2020   K 3.6  12/30/2020   CALCIUM 9.6 12/30/2020   CO2 29 12/30/2020   GLUCOSE 124 (H) 12/30/2020    Lab Results  Component Value Date/Time   HGBA1C 6.3 12/30/2020 10:31 AM   HGBA1C 6.4 (H) 05/29/2020 10:43 AM   GFR 53.94 (L) 12/30/2020 10:31 AM   GFR 62.84 12/26/2019 10:53 AM   MICROALBUR 6.3 (H) 12/30/2020 10:31 AM    Last diabetic Eye exam:  Lab Results  Component Value Date/Time   HMDIABEYEEXA No Retinopathy 09/30/2020 12:00 AM    Last diabetic Foot exam: No results found for: HMDIABFOOTEX   Lab Results  Component Value Date   CHOL 193 12/30/2020   HDL 108.80 12/30/2020   LDLCALC 72 12/30/2020   LDLDIRECT 104.5 04/25/2010   TRIG 61.0 12/30/2020   CHOLHDL 2 12/30/2020    Hepatic Function Latest Ref Rng & Units 12/30/2020 12/26/2019 12/15/2018  Total Protein 6.0 - 8.3 g/dL 7.3 7.4 7.0  Albumin 3.5 - 5.2 g/dL 4.2 4.0 4.2  AST 0 - 37 U/L 21 22 20   ALT 0 - 35 U/L 25 21 18   Alk Phosphatase 39 - 117 U/L 108 109 87  Total Bilirubin 0.2 - 1.2 mg/dL 1.0 0.7 0.6  Bilirubin, Direct 0.0 - 0.3 mg/dL 0.1 0.2 -    Lab Results  Component Value Date/Time   TSH 1.84 12/30/2020 10:31 AM   TSH 2.66 12/26/2019 10:53 AM    CBC Latest Ref Rng & Units 12/30/2020 12/26/2019 12/15/2018  WBC 4.0 - 10.5 K/uL 8.2 8.9 8.1  Hemoglobin 12.0 - 15.0 g/dL 13.0 13.5 13.3  Hematocrit 36.0 - 46.0 % 37.8 40.8 38.9  Platelets 150.0 - 400.0  K/uL 190.0 202.0 183.0    No results found for: VD25OH  Clinical ASCVD: No  The ASCVD Risk score (Arnett DK, et al., 2019) failed to calculate for the following reasons:   The 2019 ASCVD risk score is only valid for ages 33 to 26    Depression screen PHQ 2/9 07/11/2020 06/20/2019 12/16/2018  Decreased Interest 0 0 0  Down, Depressed, Hopeless 0 0 0  PHQ - 2 Score 0 0 0  Altered sleeping - - -  Tired, decreased energy - - -  Change in appetite - - -  Feeling bad or failure about yourself  - - -  Trouble concentrating - - -  Moving slowly or fidgety/restless - - -  Suicidal  thoughts - - -  PHQ-9 Score - - -  Difficult doing work/chores - - -      Social History   Tobacco Use  Smoking Status Never  Smokeless Tobacco Never   BP Readings from Last 3 Encounters:  12/30/20 (!) 152/74  05/29/20 (!) 164/73  02/29/20 (!) 160/62   Pulse Readings from Last 3 Encounters:  12/30/20 68  05/29/20 61  02/29/20 (!) 59   Wt Readings from Last 3 Encounters:  12/30/20 173 lb (78.5 kg)  05/29/20 183 lb (83 kg)  02/29/20 183 lb (83 kg)   BMI Readings from Last 3 Encounters:  12/30/20 29.70 kg/m  05/29/20 31.41 kg/m  02/29/20 31.41 kg/m    Assessment/Interventions: Review of patient past medical history, allergies, medications, health status, including review of consultants reports, laboratory and other test data, was performed as part of comprehensive evaluation and provision of chronic care management services.   SDOH:  (Social Determinants of Health) assessments and interventions performed: Yes   SDOH Screenings   Alcohol Screen: Low Risk    Last Alcohol Screening Score (AUDIT): 0  Depression (PHQ2-9): Low Risk    PHQ-2 Score: 0  Financial Resource Strain: Low Risk    Difficulty of Paying Living Expenses: Not hard at all  Food Insecurity: No Food Insecurity   Worried About Charity fundraiser in the Last Year: Never true   Ran Out of Food in the Last Year: Never true  Housing: [place] Risk Score: 0  Physical Activity: Sufficiently Active   Days of Exercise per Week: 4 days   Minutes of Exercise per Session: 60 min  Social Connections: Unknown   Frequency of Communication with Friends and Family: More than three times a week   Frequency of Social Gatherings with Friends and Family: More than three times a week   Attends Religious Services: Patient refused   Active Member of Clubs or Organizations: Patient refused   Attends Archivist Meetings: Patient refused   Marital Status: Widowed  Stress: No Stress Concern  Present   Feeling of Stress : Not at all  Tobacco Use: Low Risk    Smoking Tobacco Use: Never   Smokeless Tobacco Use: Never  Transportation Needs: No Transportation Needs   Lack of Transportation (Medical): No   Lack of Transportation (Non-Medical): No     CCM Care Plan  Allergies  Allergen Reactions   Ibuprofen Anaphylaxis   Percocet [Oxycodone-Acetaminophen]     nausea   Simvastatin Other (See Comments)    muscle aches   Diltiazem Hcl     REACTION: ANGIO EDEMA   Hydrochlorothiazide     REACTION: SWELLING   Lisinopril     REACTION: SWELLING  Verapamil     REACTION: SWELLING    Medications Reviewed Today     Reviewed by Charlton Haws, [facility] (Pharmacist) on 07/07/21 at 1337  Med List Status: <None>   Medication Order Taking? Sig Documenting Provider Last Dose Status Informant  acetaminophen (TYLENOL) 650 MG CR tablet [id number] Yes Take 650 mg by mouth every 8 (eight) hours as needed for pain. [provider] Taking Active Self  amLODipine (NORVASC) 10 MG tablet [id number] Yes TAKE 1 TABLET EVERY DAY Janith Lima, MD Taking Active   atenolol (TENORMIN) 100 MG tablet [id number] Yes TAKE 1 TABLET EVERY DAY Janith Lima, MD Taking Active   atorvastatin (LIPITOR) 10 MG tablet [id number] Yes TAKE 1 TABLET EVERY DAY Janith Lima, MD Taking Active   b complex vitamins tablet [id number] Yes Take 1 tablet by mouth daily. [provider] Taking Active Self  Cholecalciferol (VITAMIN D) 2000 UNITS tablet [id number] Yes Take 2,000 Units by mouth daily. [provider] Taking Active Self  fexofenadine (ALLEGRA) 180 MG tablet [id number] Yes Take 180 mg by mouth daily. [provider] Taking Active Self  folic acid (FOLVITE) 1 MG tablet [id number] Yes Take 1 mg by mouth daily. [provider] Taking Active Self  hydroxychloroquine (PLAQUENIL) 200 MG tablet [id number] Yes Take 200 mg by mouth 2 (two) times daily. Gavin Pound, MD Taking  Active   meclizine (MEDI-MECLIZINE) 25 MG tablet [id number] Yes Take 1 tablet (25 mg total) by mouth 3 (three) times daily as needed for dizziness. Janith Lima, MD Taking Active   methotrexate [facility]) 2.5 MG tablet [id number] Yes Take 15 mg by mouth once a week. Caution:Chemotherapy. Protect from light.  6 tablets Taken on Fridays. Gavin Pound, MD Taking Active Self  traMADol (ULTRAM) 50 MG tablet [id number] Yes TAKE 1 TABLET(50 MG) BY MOUTH EVERY 12 HOURS AS NEEDED FOR MODERATE PAIN OR SEVERE PAIN Janith Lima, MD Taking Active             Patient Active Problem List   Diagnosis Date Noted   Stage 3b chronic kidney disease ([place]) 05/30/2020   Candida infection, oral 02/29/2020   Type II diabetes mellitus with manifestations ([place]) 12/28/2019   Vertigo, benign paroxysmal, bilateral 08/15/2019   Left lumbar radiculitis 03/07/2017   Essential hypertension, benign 06/19/2014   RA (rheumatoid arthritis) ([place]) 06/19/2014   Constipation due to opioid therapy 06/19/2014   Routine health maintenance 05/30/2012   INSOMNIA UNSPECIFIED 10/31/2007   Hyperlipidemia with target LDL less than 130 10/28/2007   Seasonal allergic rhinitis 10/28/2007   DJD (degenerative joint disease) of knee 10/28/2007    Immunization History  Administered Date(s) Administered   Influenza Split 07/03/2012   Influenza, High Dose Seasonal PF 07/24/2013, 07/15/2018, 07/15/2020   Influenza,inj,Quad PF,6+ Mos 08/06/2014   Influenza-Unspecified 07/28/2016, 07/12/2017, 07/18/2019   Moderna Sars-Covid-2 Vaccination 10/24/2019, 11/21/2019   Pneumococcal Conjugate-13 10/23/2015   Pneumococcal Polysaccharide-23 04/19/2014, 12/30/2020   Td 05/30/2012   Zoster, Live 07/13/2015    Conditions to be addressed/monitored:  Hypertension, Hyperlipidemia, Diabetes, Chronic Kidney Disease and Rheumatoid arthritis  Care Plan : [place]  Updates made by Charlton Haws, [place] since 07/07/2021 12:00 AM      Problem: Hypertension, Hyperlipidemia, Diabetes, Chronic Kidney Disease and Rheumatoid arthritis   Priority: High     Long-Range Goal: Disease management   Start Date: 01/02/2021  Expected End Date: 07/05/2021  This Visit's Progress: On track  Recent Progress: On track  Priority: High  Note:   Current Barriers:  Unable to independently monitor therapeutic efficacy  Pharmacist Clinical Goal(s):  Patient will achieve adherence to monitoring guidelines and medication adherence to achieve therapeutic efficacy through collaboration with PharmD and provider.   Interventions: 1:1 collaboration with Janith Lima, MD regarding development and update of comprehensive plan of care as evidenced by provider attestation and co-signature Inter-disciplinary care team collaboration (see longitudinal plan of care) Comprehensive medication review performed; medication list updated in electronic medical record  Hypertension / CKD Stage 3a (BP goal <140/90) -Not ideally controlled - recent BP in office was above goal, pt has not been able to check at home due to BP monitor malfunction. She is planning on getting a new meter. -Current home readings: unknown -Current treatment: Amlodipine 10 mg daily AM Atenolol 100 mg daily AM -Educated on Importance of home blood pressure monitoring; Proper BP monitoring technique; -Counseled to monitor BP at home 3 times a week -Recommended to continue current medication  Hyperlipidemia: (LDL goal < 100) -Controlled - LDL at goal; pt endorses compliance with statin -Current treatment: Atorvastatin 10 mg daily HS -Educated on Cholesterol goals; Benefits of statin for ASCVD risk reduction; -Recommended to continue current medication  Rheumatoid arthritis (Goal: manage pain) -Controlled - patient sees rheumatologist Dr Trudie Reed every 3 months; she was recently strated on hydroxychloroquine for greater symptom control, she reports improvement in pain since  starting -Current treatment  Methotrexate 2.5 mg - take 15 mg (6 tab) once a week Hydroxychloroquine 587 mg BID Folic acid 1 mg daily - 3 tab daily Tramadol 50 mg q12 hr PRN Tylenol 650 mg PRN -Counseled on importance of routine monitoring/follow up visits with rheumatology Recommend to continue current medication  Glaucoma (Goal: maintain IOP at goal) -Controlled - pt sees ophthalmologist Dr Katy Fitch periodically -Current treatment  Dorzolamide-timolol eye drops Latanoprost 0.005% eye drops Rhopressa 0.02% eye drops Refresh eye drops -Recommended to continue current medication  Health Maintenance -Vaccine gaps: covid booster, shingrix, flu -Pt [facility], she does not remember dates and does not have vaccine card readily available -Advised pt to get flu vaccine  Patient Goals/Self-Care Activities Patient will:  - take medications as prescribed -focus on medication adherence by pill box -check blood pressure 3 times weekly -Keep regular appts with rheumatology -Get flu vaccine       Compliance/Adherence/Medication fill history: Care Gaps: Foot exam (05/29/21)  Star-Rating Drugs: Atorvastatin - LF 03/20/21  Medication Assistance: None required.  Patient affirms current coverage meets needs.  Patient's preferred pharmacy is:  [facility], [address] [place] [address] Phone: [phone] Fax: [phone]  [facility] [place], [address] [place] [address] Phone: [phone] Fax: [phone]  Uses pill box? Yes Pt endorses 100% compliance  We discussed: Current pharmacy is preferred with insurance plan and patient is satisfied with pharmacy services Patient decided to: Continue current medication management strategy  Care Plan and Follow Up Patient Decision:   Patient agrees to Care Plan and Follow-up.  Plan: Telephone follow up appointment with care management team member scheduled for:  6 months  Charlene Brooke, PharmD, [place], CPP Clinical Pharmacist [facility] at [facility] [phone]

## 2021-07-07 NOTE — Patient Instructions (Addendum)
Visit Information  Phone number for Pharmacist: [phone]   Goals Addressed             This Visit's Progress    Manage My Medicine       Timeframe:  Long-Range Goal Priority:  Medium Start Date:     01/01/21                        Expected End Date:  07/07/22  Follow Up Date March 2023   - call for medicine refill 2 or 3 days before it runs out - call if I am sick and can't take my medicine - keep a list of all the medicines I take; vitamins and herbals too - use a pillbox to sort medicine    Why is this important?   These steps will help you keep on track with your medicines.   Notes:      Track and Manage My Blood Pressure-Hypertension       Timeframe:  Long-Range Goal Priority:  High Start Date:       01/01/21                      Expected End Date:   07/11/21                    Follow Up Date March 2023   - check blood pressure 3 times per week - choose a place to take my blood pressure (home, clinic or office, retail store) - write blood pressure results in a log or diary    Why is this important?   You won't feel high blood pressure, but it can still hurt your blood vessels.  High blood pressure can cause heart or kidney problems. It can also cause a stroke.  Making lifestyle changes like losing a little weight or eating less salt will help.  Checking your blood pressure at home and at different times of the day can help to control blood pressure.  If the doctor prescribes medicine remember to take it the way the doctor ordered.  Call the office if you cannot afford the medicine or if there are questions about it.     Notes:         Care Plan : CCM Pharmacy Care Plan  Updates made by Charlton Haws, RPH since 07/07/2021 12:00 AM     Problem: Hypertension, Hyperlipidemia, Diabetes, Chronic Kidney Disease and Rheumatoid arthritis   Priority: High     Long-Range Goal: Disease management   Start Date: 01/02/2021  Expected End Date: 07/05/2021   This Visit's Progress: On track  Recent Progress: On track  Priority: High  Note:   Current Barriers:  Unable to independently monitor therapeutic efficacy  Pharmacist Clinical Goal(s):  Patient will achieve adherence to monitoring guidelines and medication adherence to achieve therapeutic efficacy through collaboration with PharmD and provider.   Interventions: 1:1 collaboration with Janith Lima, MD regarding development and update of comprehensive plan of care as evidenced by provider attestation and co-signature Inter-disciplinary care team collaboration (see longitudinal plan of care) Comprehensive medication review performed; medication list updated in electronic medical record  Hypertension / CKD Stage 3a (BP goal <140/90) -Not ideally controlled - recent BP in office was above goal, pt has not been able to check at home due to BP monitor malfunction. She is planning on getting a new meter. -Current home readings: unknown -Current treatment: Amlodipine 10 mg  daily AM Atenolol 100 mg daily AM -Educated on Importance of home blood pressure monitoring; Proper BP monitoring technique; -Counseled to monitor BP at home 3 times a week -Recommended to continue current medication  Hyperlipidemia: (LDL goal < 100) -Controlled - LDL at goal; pt endorses compliance with statin -Current treatment: Atorvastatin 10 mg daily HS -Educated on Cholesterol goals; Benefits of statin for ASCVD risk reduction; -Recommended to continue current medication  Rheumatoid arthritis (Goal: manage pain) -Controlled - patient sees rheumatologist Dr Trudie Reed every 3 months; she was recently strated on hydroxychloroquine for greater symptom control, she reports improvement in pain since starting -Current treatment  Methotrexate 2.5 mg - take 15 mg (6 tab) once a week Hydroxychloroquine 423 mg BID Folic acid 1 mg daily - 3 tab daily Tramadol 50 mg q12 hr PRN Tylenol 650 mg PRN -Counseled on importance  of routine monitoring/follow up visits with rheumatology Recommend to continue current medication  Glaucoma (Goal: maintain IOP at goal) -Controlled - pt sees ophthalmologist Dr Katy Fitch periodically -Current treatment  Dorzolamide-timolol eye drops Latanoprost 0.005% eye drops Rhopressa 0.02% eye drops Refresh eye drops -Recommended to continue current medication  Health Maintenance -Vaccine gaps: covid booster, shingrix, flu -Pt [facility], she does not remember dates and does not have vaccine card readily available -Advised pt to get flu vaccine  Patient Goals/Self-Care Activities Patient will:  - take medications as prescribed -focus on medication adherence by pill box -check blood pressure 3 times weekly -Keep regular appts with rheumatology -Get flu vaccine      The patient verbalized understanding of instructions, educational materials, and care plan provided today and declined offer to receive copy of patient instructions, educational materials, and care plan.  Telephone follow up appointment with pharmacy team member scheduled for: 6 months  Charlene Brooke, PharmD, [place], CPP Clinical Pharmacist [place] [facility] [phone]

## 2021-07-11 DIAGNOSIS — E118 Type 2 diabetes mellitus with unspecified complications: Secondary | ICD-10-CM

## 2021-07-11 DIAGNOSIS — I10 Essential (primary) hypertension: Secondary | ICD-10-CM | POA: Diagnosis not present

## 2021-07-11 DIAGNOSIS — M05711 Rheumatoid arthritis with rheumatoid factor of right shoulder without organ or systems involvement: Secondary | ICD-10-CM | POA: Diagnosis not present

## 2021-07-11 DIAGNOSIS — E785 Hyperlipidemia, unspecified: Secondary | ICD-10-CM | POA: Diagnosis not present

## 2021-07-11 DIAGNOSIS — N1831 Chronic kidney disease, stage 3a: Secondary | ICD-10-CM

## 2021-07-11 DIAGNOSIS — M05712 Rheumatoid arthritis with rheumatoid factor of left shoulder without organ or systems involvement: Secondary | ICD-10-CM | POA: Diagnosis not present

## 2021-07-23 ENCOUNTER — Other Ambulatory Visit: Payer: Self-pay

## 2021-07-23 DIAGNOSIS — R921 Mammographic calcification found on diagnostic imaging of breast: Secondary | ICD-10-CM | POA: Diagnosis not present

## 2021-07-23 DIAGNOSIS — N6489 Other specified disorders of breast: Secondary | ICD-10-CM | POA: Diagnosis not present

## 2021-07-24 LAB — HM MAMMOGRAPHY

## 2021-07-30 DIAGNOSIS — Z23 Encounter for immunization: Secondary | ICD-10-CM | POA: Diagnosis not present

## 2021-07-31 DIAGNOSIS — H26493 Other secondary cataract, bilateral: Secondary | ICD-10-CM | POA: Diagnosis not present

## 2021-07-31 DIAGNOSIS — H401113 Primary open-angle glaucoma, right eye, severe stage: Secondary | ICD-10-CM | POA: Diagnosis not present

## 2021-07-31 DIAGNOSIS — H04123 Dry eye syndrome of bilateral lacrimal glands: Secondary | ICD-10-CM | POA: Diagnosis not present

## 2021-07-31 DIAGNOSIS — H10413 Chronic giant papillary conjunctivitis, bilateral: Secondary | ICD-10-CM | POA: Diagnosis not present

## 2021-07-31 DIAGNOSIS — Z961 Presence of intraocular lens: Secondary | ICD-10-CM | POA: Diagnosis not present

## 2021-07-31 DIAGNOSIS — H401121 Primary open-angle glaucoma, left eye, mild stage: Secondary | ICD-10-CM | POA: Diagnosis not present

## 2021-08-02 ENCOUNTER — Encounter (HOSPITAL_COMMUNITY): Payer: Self-pay

## 2021-08-02 ENCOUNTER — Other Ambulatory Visit: Payer: Self-pay

## 2021-08-02 ENCOUNTER — Ambulatory Visit (HOSPITAL_COMMUNITY)
Admission: EM | Admit: 2021-08-02 | Discharge: 2021-08-02 | Disposition: A | Discharge status: Home / Self Care | Payer: Medicare HMO | Attending: Emergency Medicine | Admitting: Emergency Medicine

## 2021-08-02 DIAGNOSIS — B029 Zoster without complications: Secondary | ICD-10-CM | POA: Diagnosis not present

## 2021-08-02 MED ORDER — VALACYCLOVIR HCL 1 G PO TABS: 1000.0000 mg | ORAL_TABLET | Freq: Three times a day (TID) | ORAL | 0 refills | Status: DC

## 2021-08-02 NOTE — ED Provider Notes (Signed)
[facility]    CSN: [id number] Arrival date & time: 08/02/21  1349      History   Chief Complaint Chief Complaint  Patient presents with   Rash    HPI Briana [name] is a 87 y.o. female.   Patient complains of 2-day history of a rash on left side of her back and had spread in a linear pattern to her left abdomen, states the rash looks like little small blisters, some of them her prescription, states the rash is very itchy.  Patient states that 4 years ago she had the "old-fashioned" shingles vaccine.  Patient states she is never had a rash like this before.  Patient denies fever, aches, chills, nausea, vomiting, diarrhea, recent infection, contact with a sick person.  Patient reports having had chickenpox as a child.  The history is provided by the patient.   Past Medical History:  Diagnosis Date   ACUTE URIS OF UNSPECIFIED SITE 11/26/2008   ALLERGIC ARTHRITIS OTHER SPECIFIED SITES 10/22/2010   OA + RA   ALLERGIC RHINITIS 10/28/2007   ARTHRITIS, KNEES, BILATERAL 10/28/2007   BACK PAIN 04/12/2009   BUNIONECTOMY, HX OF 10/28/2007   CELLULITIS&ABSCESS OF HAND EXCEPT FINGERS&THUMB 01/28/3789   Complication of anesthesia    itching and makes me crazy   Family history of anesthesia complication    DAUGHTER HAD HIVES AND " WENT WILD "   HYPERLIPIDEMIA 10/28/2007   HYPERTENSION 10/28/2007   INSOMNIA UNSPECIFIED 10/31/2007   MYOSITIS 10/22/2010   NECK PAIN 12/03/2008   Pain in joint, site unspecified 10/07/2010   WRIST PAIN, LEFT 10/22/2010    Patient Active Problem List   Diagnosis Date Noted   Stage 3b chronic kidney disease ([place]) 05/30/2020   Candida infection, oral 02/29/2020   Type II diabetes mellitus with manifestations ([place]) 12/28/2019   Vertigo, benign paroxysmal, bilateral 08/15/2019   Left lumbar radiculitis 03/07/2017   Essential hypertension, benign 06/19/2014   RA (rheumatoid arthritis) ([place]) 06/19/2014   Constipation due to opioid therapy 06/19/2014    Routine health maintenance 05/30/2012   INSOMNIA UNSPECIFIED 10/31/2007   Hyperlipidemia with target LDL less than 130 10/28/2007   Seasonal allergic rhinitis 10/28/2007   DJD (degenerative joint disease) of knee 10/28/2007    Past Surgical History:  Procedure Laterality Date   arthroscopic surgery and rotator cuff repair left shoulder     arthroscopy right knee hx of surgery     BUNIONECTOMY     hx of   FOOT SURGERY     RIGHT   hand surgery for broken finger, remote     JOINT REPLACEMENT Right    knee   TOTAL KNEE ARTHROPLASTY  07/01/2012   Procedure: TOTAL KNEE ARTHROPLASTY;  Surgeon: Yvette Rack., MD;  Location: [facility];  Service: Orthopedics;  Laterality: Right;  right total knee arthroplasty   TOTAL KNEE ARTHROPLASTY Left 06/13/2014   dr Percell Miller   TOTAL KNEE ARTHROPLASTY Left 06/13/2014   Procedure: TOTAL KNEE ARTHROPLASTY;  Surgeon: Ninetta Lights, MD;  Location: [facility];  Service: Orthopedics;  Laterality: Left;    OB History   No obstetric history on file.      Home Medications    Prior to Admission medications   Medication Sig Start Date End Date Taking? Authorizing Provider  valACYclovir (VALTREX) 1000 MG tablet Take 1 tablet (1,000 mg total) by mouth 3 (three) times daily for 7 days. 08/02/21 08/09/21 Yes Lynden Oxford Scales, PA-C  acetaminophen (TYLENOL) 650 MG CR tablet  Take 650 mg by mouth every 8 (eight) hours as needed for pain.    [provider]  amLODipine (NORVASC) 10 MG tablet TAKE 1 TABLET EVERY DAY 06/02/21   Janith Lima, MD  atenolol (TENORMIN) 100 MG tablet TAKE 1 TABLET EVERY DAY 06/02/21   Janith Lima, MD  atorvastatin (LIPITOR) 10 MG tablet TAKE 1 TABLET EVERY DAY 06/02/21   Janith Lima, MD  b complex vitamins tablet Take 1 tablet by mouth daily.    [provider]  Cholecalciferol (VITAMIN D) 2000 UNITS tablet Take 2,000 Units by mouth daily.    [provider]  fexofenadine (ALLEGRA) 180 MG tablet Take 180 mg  by mouth daily.    [provider]  folic acid (FOLVITE) 1 MG tablet Take 1 mg by mouth daily.    [provider]  hydroxychloroquine (PLAQUENIL) 200 MG tablet Take 200 mg by mouth 2 (two) times daily.    Gavin Pound, MD  meclizine (MEDI-MECLIZINE) 25 MG tablet Take 1 tablet (25 mg total) by mouth 3 (three) times daily as needed for dizziness. 12/30/20   Janith Lima, MD  methotrexate (RHEUMATREX) 2.5 MG tablet Take 15 mg by mouth once a week. Caution:Chemotherapy. Protect from light.  6 tablets Taken on Fridays.    Gavin Pound, MD  traMADol (ULTRAM) 50 MG tablet TAKE 1 TABLET(50 MG) BY MOUTH EVERY 12 HOURS AS NEEDED FOR MODERATE PAIN OR SEVERE PAIN 12/30/20   Janith Lima, MD    Family History Family History  Problem Relation Age of Onset   Cancer Brother        lung cancer   Coronary artery disease Other    Heart attack Other    Cancer Sister        Lung, stomach    Social History Social History   Tobacco Use   Smoking status: Never   Smokeless tobacco: Never  Vaping Use   Vaping Use: Never used  Substance Use Topics   Alcohol use: No   Drug use: No     Allergies   Ibuprofen, Percocet [oxycodone-acetaminophen], Simvastatin, Diltiazem hcl, Hydrochlorothiazide, Lisinopril, and Verapamil   Review of Systems Review of Systems Pertinent findings noted in history of present illness.    Physical Exam Triage Vital Signs ED Triage Vitals  Enc Vitals Group     BP      Pulse      Resp      Temp      Temp src      SpO2      Weight      Height      Head Circumference      Peak Flow      Pain Score      Pain Loc      Pain Edu?      Excl. in [place]?    No data found.  Updated Vital Signs BP (!) 184/73   Pulse 60   Temp 98.9 F (37.2 C) (Oral)   Resp 16   SpO2 98%   Visual Acuity Right Eye Distance:   Left Eye Distance:   Bilateral Distance:    Right Eye Near:   Left Eye Near:    Bilateral Near:     Physical Exam Vitals and  nursing note reviewed.  Constitutional:      Appearance: Normal appearance.  HENT:     Head: Normocephalic and atraumatic.  Eyes:     Conjunctiva/sclera: Conjunctivae normal.  Cardiovascular:  Rate and Rhythm: Normal rate and regular rhythm.     Pulses: Normal pulses.     Heart sounds: Normal heart sounds.  Pulmonary:     Effort: Pulmonary effort is normal.     Breath sounds: Normal breath sounds.  Abdominal:     General: Abdomen is flat. Bowel sounds are normal.     Palpations: Abdomen is soft.  Musculoskeletal:        General: Normal range of motion.     Cervical back: Normal range of motion and neck supple.  Skin:    General: Skin is warm and dry.     Comments: Clear, fluid-filled blisters scattered in a dermatomal pattern from left mid back toward left mid abdomen, all blisters on erythematous base, some deroofed.  Neurological:     General: No focal deficit present.     Mental Status: She is alert and oriented to person, place, and time. Mental status is at baseline.  Psychiatric:        Mood and Affect: Mood normal.        Behavior: Behavior normal.     UC Treatments / Results  Labs (all labs ordered are listed, but only abnormal results are displayed) Labs Reviewed - No data to display  EKG   Radiology No results found.  Procedures Procedures (including critical care time)  Medications Ordered in UC Medications - No data to display  Initial Impression / Assessment and Plan / UC Course  I have reviewed the triage vital signs and the nursing notes.  Pertinent labs & imaging results that were available during my care of the patient were reviewed by me and considered in my medical decision making (see chart for details).     Patient's symptoms onset are fairly close to the 48-hour mark, recommend that she begin valacyclovir 3 times daily for the next 7 days.  Also recommended that she follow-up with her primary care provider to see if having Shingrix  vaccine at this time is indicated as it is likely that she received the Zostavax the first time.  Patient verbalized understanding and agreement of plan as discussed.  All questions were addressed during visit.  Please see discharge instructions below for further details of plan.  Final Clinical Impressions(s) / UC Diagnoses   Final diagnoses:  Herpes zoster without complication     Discharge Instructions      Please begin Valacyclovir, take 1 tablet 3 times daily for the next 7 days.  Please follow-up with your primary care physician to discuss having your shingles vaccine repeated.     ED Prescriptions     Medication Sig Dispense Auth. Provider   valACYclovir (VALTREX) 1000 MG tablet Take 1 tablet (1,000 mg total) by mouth 3 (three) times daily for 7 days. 21 tablet Lynden Oxford Scales, PA-C      PDMP not reviewed this encounter.   Lynden Oxford Scales, [place] 08/02/21 [phone]

## 2021-08-02 NOTE — ED Triage Notes (Signed)
Pt presents with a rash on the L back. States she has applied ointment that has not helped.

## 2021-08-02 NOTE — Discharge Instructions (Signed)
Please begin Valacyclovir, take 1 tablet 3 times daily for the next 7 days.  Please follow-up with your primary care physician to discuss having your shingles vaccine repeated.

## 2021-08-04 ENCOUNTER — Other Ambulatory Visit: Payer: Self-pay

## 2021-08-04 ENCOUNTER — Other Ambulatory Visit: Payer: Self-pay | Admitting: Internal Medicine

## 2021-08-04 ENCOUNTER — Ambulatory Visit (INDEPENDENT_AMBULATORY_CARE_PROVIDER_SITE_OTHER): Payer: Medicare HMO | Admitting: Internal Medicine

## 2021-08-04 ENCOUNTER — Telehealth: Payer: Self-pay | Admitting: Internal Medicine

## 2021-08-04 ENCOUNTER — Encounter: Payer: Self-pay | Admitting: Internal Medicine

## 2021-08-04 DIAGNOSIS — B029 Zoster without complications: Secondary | ICD-10-CM | POA: Diagnosis not present

## 2021-08-04 MED ORDER — LIDOCAINE 5 % EX PTCH: 1.0000 | MEDICATED_PATCH | CUTANEOUS | 0 refills | Status: DC

## 2021-08-04 MED ORDER — GABAPENTIN 300 MG PO CAPS: 300.0000 mg | ORAL_CAPSULE | Freq: Two times a day (BID) | ORAL | 0 refills | Status: DC | PRN

## 2021-08-04 NOTE — Telephone Encounter (Signed)
Patient has shingles, went to urgent care on Saturday  Experiencing severe left side pain  Transferred call to team health

## 2021-08-04 NOTE — Assessment & Plan Note (Addendum)
Pain is severe and rx gabapentin 300 mg BID and lidoderm 5% patches for pain. Advised to continue taking valtrex until gone. Counseled that pain may or may not resolve within a few weeks that it could be months and is unpredictable.

## 2021-08-04 NOTE — Progress Notes (Signed)
   Subjective:   Patient ID: Briana [name], female    DOB: Aug 21, 1931, 87 y.o.   MRN: [id number]  HPI The patient is an 87 YO female coming in for urgent care follow up shingles outbreak. Taking valtrex about 1 day or so now.  Review of Systems  Constitutional: Negative.   HENT: Negative.    Eyes: Negative.   Respiratory:  Negative for cough, chest tightness and shortness of breath.   Cardiovascular:  Negative for chest pain, palpitations and leg swelling.  Gastrointestinal:  Negative for abdominal distention, abdominal pain, constipation, diarrhea, nausea and vomiting.  Musculoskeletal:  Positive for myalgias.  Skin:  Positive for rash.  Neurological: Negative.   Psychiatric/Behavioral: Negative.     Objective:  Physical Exam Constitutional:      Appearance: She is well-developed.  HENT:     Head: Normocephalic and atraumatic.  Cardiovascular:     Rate and Rhythm: Normal rate and regular rhythm.  Pulmonary:     Effort: Pulmonary effort is normal. No respiratory distress.     Breath sounds: Normal breath sounds. No wheezing or rales.  Abdominal:     General: Bowel sounds are normal. There is no distension.     Palpations: Abdomen is soft.     Tenderness: There is no abdominal tenderness. There is no rebound.  Musculoskeletal:        General: Tenderness present.     Cervical back: Normal range of motion.  Skin:    General: Skin is warm and dry.     Findings: Rash present.     Comments: Rash consistent with shingles left stomach/flank  Neurological:     Mental Status: She is alert and oriented to person, place, and time.     Coordination: Coordination normal.    Vitals:   08/04/21 1411  BP: 130/70  Pulse: 62  Resp: 18  SpO2: 98%  Weight: 166 lb 12.8 oz (75.7 kg)  Height: 5\' 4"  (1.626 m)    This visit occurred during the SARS-CoV-2 public health emergency.  Safety protocols were in place, including screening questions prior to the visit, additional usage of  staff PPE, and extensive cleaning of exam room while observing appropriate contact time as indicated for disinfecting solutions.   Assessment & Plan:

## 2021-08-04 NOTE — Patient Instructions (Signed)
Capsaicin cream can sometimes help topically.   We have sent in gabapentin to help with pain as well as a lidocaine patches to use for pain.

## 2021-08-06 ENCOUNTER — Telehealth: Payer: Self-pay | Admitting: Internal Medicine

## 2021-08-06 NOTE — Telephone Encounter (Signed)
Patient calling in  Was seen by Dr. Sharlet Salina 10/24 & prescribed gabapentin (NEURONTIN) 300 MG capsule & since then she has been having the shakes.. patient says she is shaking so bad she is having trouble walking  Wants nurse or provider to call her w/ recommendations [phone]

## 2021-08-06 NOTE — Telephone Encounter (Signed)
Please advise due to Dr. Sharlet Salina being out for the day

## 2021-08-06 NOTE — Telephone Encounter (Signed)
Ok to hold on taking the gabapentin for now, but if not improved would need ROV or go to ED /  UC

## 2021-08-07 ENCOUNTER — Emergency Department (HOSPITAL_COMMUNITY): Payer: Medicare HMO

## 2021-08-07 ENCOUNTER — Telehealth: Payer: Self-pay | Admitting: Internal Medicine

## 2021-08-07 ENCOUNTER — Inpatient Hospital Stay (HOSPITAL_COMMUNITY)
Admission: EM | Admit: 2021-08-07 | Discharge: 2021-08-10 | DRG: 684 | Disposition: A | Discharge status: Home Health | Payer: Medicare HMO | Attending: Internal Medicine | Admitting: Internal Medicine

## 2021-08-07 ENCOUNTER — Encounter (HOSPITAL_COMMUNITY): Payer: Self-pay

## 2021-08-07 ENCOUNTER — Other Ambulatory Visit: Payer: Self-pay

## 2021-08-07 DIAGNOSIS — E785 Hyperlipidemia, unspecified: Secondary | ICD-10-CM | POA: Diagnosis not present

## 2021-08-07 DIAGNOSIS — T426X1A Poisoning by other antiepileptic and sedative-hypnotic drugs, accidental (unintentional), initial encounter: Secondary | ICD-10-CM | POA: Diagnosis not present

## 2021-08-07 DIAGNOSIS — M069 Rheumatoid arthritis, unspecified: Secondary | ICD-10-CM | POA: Diagnosis not present

## 2021-08-07 DIAGNOSIS — R531 Weakness: Secondary | ICD-10-CM | POA: Diagnosis not present

## 2021-08-07 DIAGNOSIS — I739 Peripheral vascular disease, unspecified: Secondary | ICD-10-CM | POA: Diagnosis not present

## 2021-08-07 DIAGNOSIS — Z8619 Personal history of other infectious and parasitic diseases: Secondary | ICD-10-CM

## 2021-08-07 DIAGNOSIS — Z79899 Other long term (current) drug therapy: Secondary | ICD-10-CM

## 2021-08-07 DIAGNOSIS — Z886 Allergy status to analgesic agent status: Secondary | ICD-10-CM

## 2021-08-07 DIAGNOSIS — Z8249 Family history of ischemic heart disease and other diseases of the circulatory system: Secondary | ICD-10-CM

## 2021-08-07 DIAGNOSIS — E119 Type 2 diabetes mellitus without complications: Secondary | ICD-10-CM | POA: Diagnosis present

## 2021-08-07 DIAGNOSIS — R8271 Bacteriuria: Secondary | ICD-10-CM | POA: Diagnosis present

## 2021-08-07 DIAGNOSIS — N179 Acute kidney failure, unspecified: Principal | ICD-10-CM | POA: Diagnosis present

## 2021-08-07 DIAGNOSIS — I6200 Nontraumatic subdural hemorrhage, unspecified: Secondary | ICD-10-CM | POA: Diagnosis not present

## 2021-08-07 DIAGNOSIS — R251 Tremor, unspecified: Secondary | ICD-10-CM | POA: Diagnosis present

## 2021-08-07 DIAGNOSIS — E118 Type 2 diabetes mellitus with unspecified complications: Secondary | ICD-10-CM | POA: Diagnosis present

## 2021-08-07 DIAGNOSIS — M05712 Rheumatoid arthritis with rheumatoid factor of left shoulder without organ or systems involvement: Secondary | ICD-10-CM | POA: Diagnosis not present

## 2021-08-07 DIAGNOSIS — Z888 Allergy status to other drugs, medicaments and biological substances status: Secondary | ICD-10-CM

## 2021-08-07 DIAGNOSIS — B029 Zoster without complications: Secondary | ICD-10-CM | POA: Diagnosis not present

## 2021-08-07 DIAGNOSIS — Z96653 Presence of artificial knee joint, bilateral: Secondary | ICD-10-CM | POA: Diagnosis present

## 2021-08-07 DIAGNOSIS — D181 Lymphangioma, any site: Secondary | ICD-10-CM | POA: Diagnosis not present

## 2021-08-07 DIAGNOSIS — I10 Essential (primary) hypertension: Secondary | ICD-10-CM | POA: Diagnosis present

## 2021-08-07 DIAGNOSIS — T375X5A Adverse effect of antiviral drugs, initial encounter: Secondary | ICD-10-CM | POA: Diagnosis present

## 2021-08-07 DIAGNOSIS — Z20822 Contact with and (suspected) exposure to covid-19: Secondary | ICD-10-CM | POA: Diagnosis present

## 2021-08-07 DIAGNOSIS — R29818 Other symptoms and signs involving the nervous system: Secondary | ICD-10-CM | POA: Diagnosis not present

## 2021-08-07 DIAGNOSIS — M05711 Rheumatoid arthritis with rheumatoid factor of right shoulder without organ or systems involvement: Secondary | ICD-10-CM | POA: Diagnosis not present

## 2021-08-07 LAB — COMPREHENSIVE METABOLIC PANEL
ALT: 30 U/L (ref 0–44)
AST: 47 U/L — ABNORMAL HIGH (ref 15–41)
Albumin: 3.7 g/dL (ref 3.5–5.0)
Alkaline Phosphatase: 89 U/L (ref 38–126)
Anion gap: 9 (ref 5–15)
BUN: 45 mg/dL — ABNORMAL HIGH (ref 8–23)
CO2: 23 mmol/L (ref 22–32)
Calcium: 9.3 mg/dL (ref 8.9–10.3)
Chloride: 106 mmol/L (ref 98–111)
Creatinine, Ser: 3.48 mg/dL — ABNORMAL HIGH (ref 0.44–1.00)
GFR, Estimated: 12 mL/min — ABNORMAL LOW (ref >60)
Glucose, Bld: 119 mg/dL — ABNORMAL HIGH (ref 70–99)
Potassium: 5.2 mmol/L — ABNORMAL HIGH (ref 3.5–5.1)
Sodium: 138 mmol/L (ref 135–145)
Total Bilirubin: 1.1 mg/dL (ref 0.3–1.2)
Total Protein: 7.1 g/dL (ref 6.5–8.1)

## 2021-08-07 LAB — CBC WITH DIFFERENTIAL/PLATELET
Abs Immature Granulocytes: 0.05 10*3/uL (ref 0.00–0.07)
Basophils Absolute: 0.1 10*3/uL (ref 0.0–0.1)
Basophils Relative: 1 %
Eosinophils Absolute: 0.1 10*3/uL (ref 0.0–0.5)
Eosinophils Relative: 1 %
HCT: 39 % (ref 36.0–46.0)
Hemoglobin: 13.3 g/dL (ref 12.0–15.0)
Immature Granulocytes: 0 %
Lymphocytes Relative: 15 %
Lymphs Abs: 1.8 10*3/uL (ref 0.7–4.0)
MCH: 31.9 pg (ref 26.0–34.0)
MCHC: 34.1 g/dL (ref 30.0–36.0)
MCV: 93.5 fL (ref 80.0–100.0)
Monocytes Absolute: 1.1 10*3/uL — ABNORMAL HIGH (ref 0.1–1.0)
Monocytes Relative: 9 %
Neutro Abs: 8.8 10*3/uL — ABNORMAL HIGH (ref 1.7–7.7)
Neutrophils Relative %: 74 %
Platelets: 168 10*3/uL (ref 150–400)
RBC: 4.17 MIL/uL (ref 3.87–5.11)
RDW: 15.7 % — ABNORMAL HIGH (ref 11.5–15.5)
WBC: 11.9 10*3/uL — ABNORMAL HIGH (ref 4.0–10.5)
nRBC: 0 % (ref 0.0–0.2)

## 2021-08-07 LAB — LACTIC ACID, PLASMA: Lactic Acid, Venous: 1.5 mmol/L (ref 0.5–1.9)

## 2021-08-07 NOTE — ED Triage Notes (Signed)
Pt was started on a new Shingles medication on Saturday (shingles on her left torso). Since then she has been having shaking motions & a slurred speech started Tuesday night. Also reports weakness & feeling like she will falll after walking "a little bit." A/Ox4, verbal able to make needs known.

## 2021-08-07 NOTE — Telephone Encounter (Signed)
Patient calling to state she has loss balance and has trembles x2d  Patient states she can not stand up w/o assistance and she can not grip anything in her hand due to the trembles  Patient was transferred to team health

## 2021-08-07 NOTE — Telephone Encounter (Signed)
Pt stated that she declined to go to the ED.

## 2021-08-07 NOTE — ED Provider Notes (Signed)
Emergency Medicine Provider Triage Evaluation Note  Briana [name] , a 87 y.o. female  was evaluated in triage.  Pt complains of shaking like a leaf, onset yesterday when starting medicine for shingles (left lower back shingles) earlier this week (Saturday). Also with slurred speech onset Tuesday night.   Review of Systems  Positive: Shaking, changes in speech  Negative: Fever, URI symptoms  Physical Exam  There were no vitals taken for this visit. Gen:   Awake, no distress   Resp:  Normal effort  MSK:   Moves extremities without difficulty  Other:  No unilateral weakness or numbness, no drift  Medical Decision Making  Medically screening exam initiated at 4:43 PM.  Appropriate orders placed.  Briana [name] was informed that the remainder of the evaluation will be completed by another provider, this initial triage assessment does not replace that evaluation, and the importance of remaining in the ED until their evaluation is complete.     Tacy Learn, PA-C 08/07/21 1650    Malvin Johns, MD 08/07/21 [phone]

## 2021-08-07 NOTE — Telephone Encounter (Signed)
[facility] called back to get the patient scheduled within 4 hours, no appointments available  Sending to MD for further instructions

## 2021-08-08 ENCOUNTER — Encounter (HOSPITAL_COMMUNITY): Payer: Self-pay | Admitting: Internal Medicine

## 2021-08-08 ENCOUNTER — Inpatient Hospital Stay (HOSPITAL_COMMUNITY): Payer: Medicare HMO

## 2021-08-08 DIAGNOSIS — E119 Type 2 diabetes mellitus without complications: Secondary | ICD-10-CM | POA: Diagnosis present

## 2021-08-08 DIAGNOSIS — B029 Zoster without complications: Secondary | ICD-10-CM | POA: Diagnosis present

## 2021-08-08 DIAGNOSIS — I10 Essential (primary) hypertension: Secondary | ICD-10-CM

## 2021-08-08 DIAGNOSIS — T375X5A Adverse effect of antiviral drugs, initial encounter: Secondary | ICD-10-CM | POA: Diagnosis present

## 2021-08-08 DIAGNOSIS — Z96653 Presence of artificial knee joint, bilateral: Secondary | ICD-10-CM | POA: Diagnosis present

## 2021-08-08 DIAGNOSIS — T426X1A Poisoning by other antiepileptic and sedative-hypnotic drugs, accidental (unintentional), initial encounter: Secondary | ICD-10-CM | POA: Diagnosis present

## 2021-08-08 DIAGNOSIS — Z79899 Other long term (current) drug therapy: Secondary | ICD-10-CM | POA: Diagnosis not present

## 2021-08-08 DIAGNOSIS — Z8619 Personal history of other infectious and parasitic diseases: Secondary | ICD-10-CM | POA: Diagnosis not present

## 2021-08-08 DIAGNOSIS — M05712 Rheumatoid arthritis with rheumatoid factor of left shoulder without organ or systems involvement: Secondary | ICD-10-CM | POA: Diagnosis not present

## 2021-08-08 DIAGNOSIS — E118 Type 2 diabetes mellitus with unspecified complications: Secondary | ICD-10-CM | POA: Diagnosis not present

## 2021-08-08 DIAGNOSIS — N179 Acute kidney failure, unspecified: Secondary | ICD-10-CM | POA: Diagnosis present

## 2021-08-08 DIAGNOSIS — R251 Tremor, unspecified: Secondary | ICD-10-CM | POA: Diagnosis present

## 2021-08-08 DIAGNOSIS — Z888 Allergy status to other drugs, medicaments and biological substances status: Secondary | ICD-10-CM | POA: Diagnosis not present

## 2021-08-08 DIAGNOSIS — Z8249 Family history of ischemic heart disease and other diseases of the circulatory system: Secondary | ICD-10-CM | POA: Diagnosis not present

## 2021-08-08 DIAGNOSIS — M05711 Rheumatoid arthritis with rheumatoid factor of right shoulder without organ or systems involvement: Secondary | ICD-10-CM

## 2021-08-08 DIAGNOSIS — Z886 Allergy status to analgesic agent status: Secondary | ICD-10-CM | POA: Diagnosis not present

## 2021-08-08 DIAGNOSIS — E785 Hyperlipidemia, unspecified: Secondary | ICD-10-CM | POA: Diagnosis present

## 2021-08-08 DIAGNOSIS — R8271 Bacteriuria: Secondary | ICD-10-CM | POA: Diagnosis present

## 2021-08-08 DIAGNOSIS — Z20822 Contact with and (suspected) exposure to covid-19: Secondary | ICD-10-CM | POA: Diagnosis present

## 2021-08-08 DIAGNOSIS — M069 Rheumatoid arthritis, unspecified: Secondary | ICD-10-CM | POA: Diagnosis present

## 2021-08-08 LAB — URINALYSIS, ROUTINE W REFLEX MICROSCOPIC
Bilirubin Urine: NEGATIVE
Glucose, UA: NEGATIVE mg/dL
Hgb urine dipstick: NEGATIVE
Ketones, ur: NEGATIVE mg/dL
Nitrite: NEGATIVE
Protein, ur: NEGATIVE mg/dL
Specific Gravity, Urine: 1.008 (ref 1.005–1.030)
pH: 5 (ref 5.0–8.0)

## 2021-08-08 LAB — COMPREHENSIVE METABOLIC PANEL
ALT: 39 U/L (ref 0–44)
AST: 40 U/L (ref 15–41)
Albumin: 3.7 g/dL (ref 3.5–5.0)
Alkaline Phosphatase: 86 U/L (ref 38–126)
Anion gap: 12 (ref 5–15)
BUN: 37 mg/dL — ABNORMAL HIGH (ref 8–23)
CO2: 22 mmol/L (ref 22–32)
Calcium: 9.8 mg/dL (ref 8.9–10.3)
Chloride: 108 mmol/L (ref 98–111)
Creatinine, Ser: 2.72 mg/dL — ABNORMAL HIGH (ref 0.44–1.00)
GFR, Estimated: 16 mL/min — ABNORMAL LOW (ref >60)
Glucose, Bld: 144 mg/dL — ABNORMAL HIGH (ref 70–99)
Potassium: 3.4 mmol/L — ABNORMAL LOW (ref 3.5–5.1)
Sodium: 142 mmol/L (ref 135–145)
Total Bilirubin: 0.7 mg/dL (ref 0.3–1.2)
Total Protein: 7.4 g/dL (ref 6.5–8.1)

## 2021-08-08 LAB — CBG MONITORING, ED
Glucose-Capillary: 125 mg/dL — ABNORMAL HIGH (ref 70–99)
Glucose-Capillary: 99 mg/dL (ref 70–99)
Glucose-Capillary: 152 mg/dL — ABNORMAL HIGH (ref 70–99)

## 2021-08-08 LAB — CBC WITH DIFFERENTIAL/PLATELET
Abs Immature Granulocytes: 0.05 10*3/uL (ref 0.00–0.07)
Basophils Absolute: 0.1 10*3/uL (ref 0.0–0.1)
Basophils Relative: 1 %
Eosinophils Absolute: 0.1 10*3/uL (ref 0.0–0.5)
Eosinophils Relative: 1 %
HCT: 39.6 % (ref 36.0–46.0)
Hemoglobin: 13.9 g/dL (ref 12.0–15.0)
Immature Granulocytes: 0 %
Lymphocytes Relative: 15 %
Lymphs Abs: 1.7 10*3/uL (ref 0.7–4.0)
MCH: 32 pg (ref 26.0–34.0)
MCHC: 35.1 g/dL (ref 30.0–36.0)
MCV: 91.2 fL (ref 80.0–100.0)
Monocytes Absolute: 1 10*3/uL (ref 0.1–1.0)
Monocytes Relative: 9 %
Neutro Abs: 8.8 10*3/uL — ABNORMAL HIGH (ref 1.7–7.7)
Neutrophils Relative %: 74 %
Platelets: 161 10*3/uL (ref 150–400)
RBC: 4.34 MIL/uL (ref 3.87–5.11)
RDW: 15.5 % (ref 11.5–15.5)
WBC: 11.7 10*3/uL — ABNORMAL HIGH (ref 4.0–10.5)
nRBC: 0 % (ref 0.0–0.2)

## 2021-08-08 LAB — RESP PANEL BY RT-PCR (FLU A&B, COVID) ARPGX2
Influenza A by PCR: NEGATIVE
Influenza B by PCR: NEGATIVE
SARS Coronavirus 2 by RT PCR: NEGATIVE

## 2021-08-08 LAB — SODIUM, URINE, RANDOM: Sodium, Ur: 42 mmol/L

## 2021-08-08 LAB — HEMOGLOBIN A1C
Hgb A1c MFr Bld: 6.2 % — ABNORMAL HIGH (ref 4.8–5.6)
Mean Plasma Glucose: 131.24 mg/dL

## 2021-08-08 LAB — CK: Total CK: 125 U/L (ref 38–234)

## 2021-08-08 LAB — GLUCOSE, CAPILLARY: Glucose-Capillary: 138 mg/dL — ABNORMAL HIGH (ref 70–99)

## 2021-08-08 LAB — CREATININE, URINE, RANDOM: Creatinine, Urine: 87.27 mg/dL

## 2021-08-08 LAB — LACTIC ACID, PLASMA: Lactic Acid, Venous: 2 mmol/L (ref 0.5–1.9)

## 2021-08-08 MED ORDER — HYDROXYCHLOROQUINE SULFATE 200 MG PO TABS
200.0000 mg | ORAL_TABLET | Freq: Two times a day (BID) | ORAL | Status: DC
  Administered 2021-08-09 – 2021-08-10 (×3): 200 mg via ORAL
  Filled 2021-08-08 (×3): qty 1

## 2021-08-08 MED ORDER — SODIUM CHLORIDE 0.9 % IV SOLN: INTRAVENOUS | Status: AC

## 2021-08-08 MED ORDER — NETARSUDIL DIMESYLATE 0.02 % OP SOLN
1.0000 [drp] | Freq: Every day | OPHTHALMIC | Status: DC
  Administered 2021-08-09: 1 [drp] via OPHTHALMIC

## 2021-08-08 MED ORDER — MORPHINE SULFATE (PF) 2 MG/ML IV SOLN
0.5000 mg | INTRAVENOUS | Status: DC | PRN
  Administered 2021-08-08 – 2021-08-10 (×4): 0.5 mg via INTRAVENOUS
  Filled 2021-08-08 (×4): qty 1

## 2021-08-08 MED ORDER — SODIUM CHLORIDE 0.9 % IV SOLN
1.0000 g | Freq: Every day | INTRAVENOUS | Status: DC
  Administered 2021-08-08: 1 g via INTRAVENOUS
  Filled 2021-08-08: qty 10

## 2021-08-08 MED ORDER — POTASSIUM CHLORIDE CRYS ER 20 MEQ PO TBCR
40.0000 meq | EXTENDED_RELEASE_TABLET | Freq: Once | ORAL | Status: AC
  Administered 2021-08-08: 40 meq via ORAL
  Filled 2021-08-08: qty 2

## 2021-08-08 MED ORDER — LIDOCAINE 5 % EX PTCH
1.0000 | MEDICATED_PATCH | CUTANEOUS | Status: DC
  Administered 2021-08-08 – 2021-08-10 (×3): 1 via TRANSDERMAL
  Filled 2021-08-08 (×3): qty 1

## 2021-08-08 MED ORDER — HEPARIN SODIUM (PORCINE) 5000 UNIT/ML IJ SOLN
5000.0000 [IU] | Freq: Three times a day (TID) | INTRAMUSCULAR | Status: DC
  Administered 2021-08-08 – 2021-08-10 (×7): 5000 [IU] via SUBCUTANEOUS
  Filled 2021-08-08 (×7): qty 1

## 2021-08-08 MED ORDER — LACTATED RINGERS IV BOLUS
1000.0000 mL | Freq: Once | INTRAVENOUS | Status: AC
  Administered 2021-08-08: 1000 mL via INTRAVENOUS

## 2021-08-08 MED ORDER — SODIUM CHLORIDE 0.9 % IV BOLUS
500.0000 mL | Freq: Once | INTRAVENOUS | Status: AC
  Administered 2021-08-08: 500 mL via INTRAVENOUS

## 2021-08-08 MED ORDER — INSULIN ASPART 100 UNIT/ML IJ SOLN
0.0000 [IU] | Freq: Three times a day (TID) | INTRAMUSCULAR | Status: DC
  Administered 2021-08-08: 2 [IU] via SUBCUTANEOUS
  Administered 2021-08-08: 1 [IU] via SUBCUTANEOUS
  Administered 2021-08-09: 2 [IU] via SUBCUTANEOUS

## 2021-08-08 MED ORDER — ATENOLOL 50 MG PO TABS
100.0000 mg | ORAL_TABLET | Freq: Every day | ORAL | Status: DC
  Administered 2021-08-08: 100 mg via ORAL
  Filled 2021-08-08: qty 4

## 2021-08-08 MED ORDER — AMLODIPINE BESYLATE 10 MG PO TABS
10.0000 mg | ORAL_TABLET | Freq: Every day | ORAL | Status: DC
  Administered 2021-08-08: 10 mg via ORAL
  Filled 2021-08-08: qty 2

## 2021-08-08 MED ORDER — DORZOLAMIDE HCL-TIMOLOL MAL 2-0.5 % OP SOLN
1.0000 [drp] | Freq: Two times a day (BID) | OPHTHALMIC | Status: DC
  Administered 2021-08-08 – 2021-08-10 (×4): 1 [drp] via OPHTHALMIC
  Filled 2021-08-08 (×2): qty 10

## 2021-08-08 MED ORDER — ACETAMINOPHEN 650 MG RE SUPP: 650.0000 mg | Freq: Four times a day (QID) | RECTAL | Status: DC | PRN

## 2021-08-08 MED ORDER — FOLIC ACID 1 MG PO TABS
1.0000 mg | ORAL_TABLET | Freq: Every day | ORAL | Status: DC
  Administered 2021-08-08 – 2021-08-10 (×3): 1 mg via ORAL
  Filled 2021-08-08 (×3): qty 1

## 2021-08-08 MED ORDER — LACTATED RINGERS IV SOLN: INTRAVENOUS | Status: DC

## 2021-08-08 MED ORDER — LATANOPROST 0.005 % OP SOLN
1.0000 [drp] | Freq: Every day | OPHTHALMIC | Status: DC
  Administered 2021-08-08 – 2021-08-09 (×2): 1 [drp] via OPHTHALMIC
  Filled 2021-08-08: qty 2.5

## 2021-08-08 MED ORDER — ACETAMINOPHEN 325 MG PO TABS
650.0000 mg | ORAL_TABLET | Freq: Four times a day (QID) | ORAL | Status: DC | PRN
  Administered 2021-08-08 – 2021-08-10 (×5): 650 mg via ORAL
  Filled 2021-08-08 (×5): qty 2

## 2021-08-08 MED ORDER — ATORVASTATIN CALCIUM 10 MG PO TABS
10.0000 mg | ORAL_TABLET | Freq: Every day | ORAL | Status: DC
  Administered 2021-08-08 – 2021-08-09 (×2): 10 mg via ORAL
  Filled 2021-08-08 (×2): qty 1

## 2021-08-08 NOTE — ED Notes (Signed)
Bladder scan performed, 28mL result

## 2021-08-08 NOTE — ED Notes (Signed)
Urine sample not obtained - pt missed the hat in the toilet per Nurse tech Dyann Ruddle.

## 2021-08-08 NOTE — ED Notes (Signed)
Dr. Hal Hope informed of pts lactic acid of 2.0 via epic secure chat

## 2021-08-08 NOTE — Telephone Encounter (Signed)
Caller states that she loses her balance, is shaky, and has fallen. Her son caught her, so she was not injured. No dizziness when this happens. She tries to hold things like the phone, but she drops them. It started 2-3 days ago. She went to UC recently & was started on Valacyclovir for shingles. She states they told her it could make her dizzy.   Advised to See HCP within 4 Hours (or PCP triage.   Spoke to staff at the office. She states they are fully booked. so she is going to send a message to the provider & will call the patient back.   Notified caller that the office will call her back with instructions.

## 2021-08-08 NOTE — ED Provider Notes (Signed)
[facility] EMERGENCY DEPARTMENT Provider Note   CSN: [id number] Arrival date & time: 08/07/21  1453     History Chief Complaint  Patient presents with   Shaking   Weakness    Custar is a 87 y.o. female.  87 yo F with a chief complaints of weakness.  Patient states that she is very shaky especially when she tries to get up and move.  Been going on for about a week now.  It was thought to be a reaction to her shingles medicines that she got from her family doctor.  Has been taking them for about a week.  She denies any fevers or chills.  Denies any lesions that have popped up on the other side.  Complains of pain along the area of the shingles rash.  She feels like she has been eating and drinking normally.  Denies nausea vomiting or diarrhea.  She thinks it could be a medication reaction and eventually was encouraged to come to the Emergency Department for evaluation.  She denies any difficulty with urination.  The history is provided by the patient.  Weakness Severity:  Moderate Onset quality:  Gradual Duration:  1 week Timing:  Constant Progression:  Worsening Chronicity:  New Relieved by:  Nothing Worsened by:  Nothing Ineffective treatments:  None tried Associated symptoms: no arthralgias, no chest pain, no dizziness, no dysuria, no fever, no headaches, no myalgias, no nausea, no shortness of breath, no urgency and no vomiting       Past Medical History:  Diagnosis Date   ACUTE URIS OF UNSPECIFIED SITE 11/26/2008   ALLERGIC ARTHRITIS OTHER SPECIFIED SITES 10/22/2010   OA + RA   ALLERGIC RHINITIS 10/28/2007   ARTHRITIS, KNEES, BILATERAL 10/28/2007   BACK PAIN 04/12/2009   BUNIONECTOMY, HX OF 10/28/2007   CELLULITIS&ABSCESS OF HAND EXCEPT FINGERS&THUMB 01/27/4080   Complication of anesthesia    itching and makes me crazy   Family history of anesthesia complication    DAUGHTER HAD HIVES AND " WENT WILD "   HYPERLIPIDEMIA 10/28/2007   HYPERTENSION  10/28/2007   INSOMNIA UNSPECIFIED 10/31/2007   MYOSITIS 10/22/2010   NECK PAIN 12/03/2008   Pain in joint, site unspecified 10/07/2010   WRIST PAIN, LEFT 10/22/2010    Patient Active Problem List   Diagnosis Date Noted   Shingles 08/04/2021   Stage 3b chronic kidney disease ([place]) 05/30/2020   Candida infection, oral 02/29/2020   Type II diabetes mellitus with manifestations ([place]) 12/28/2019   Vertigo, benign paroxysmal, bilateral 08/15/2019   Left lumbar radiculitis 03/07/2017   Essential hypertension, benign 06/19/2014   RA (rheumatoid arthritis) ([place]) 06/19/2014   Constipation due to opioid therapy 06/19/2014   Routine health maintenance 05/30/2012   INSOMNIA UNSPECIFIED 10/31/2007   Hyperlipidemia with target LDL less than 130 10/28/2007   Seasonal allergic rhinitis 10/28/2007   DJD (degenerative joint disease) of knee 10/28/2007    Past Surgical History:  Procedure Laterality Date   arthroscopic surgery and rotator cuff repair left shoulder     arthroscopy right knee hx of surgery     BUNIONECTOMY     hx of   FOOT SURGERY     RIGHT   hand surgery for broken finger, remote     JOINT REPLACEMENT Right    knee   TOTAL KNEE ARTHROPLASTY  07/01/2012   Procedure: TOTAL KNEE ARTHROPLASTY;  Surgeon: Yvette Rack., MD;  Location: [facility] OR;  Service: Orthopedics;  Laterality: Right;  right  total knee arthroplasty   TOTAL KNEE ARTHROPLASTY Left 06/13/2014   dr Percell Miller   TOTAL KNEE ARTHROPLASTY Left 06/13/2014   Procedure: TOTAL KNEE ARTHROPLASTY;  Surgeon: Ninetta Lights, MD;  Location: [facility];  Service: Orthopedics;  Laterality: Left;     OB History   No obstetric history on file.     Family History  Problem Relation Age of Onset   Cancer Brother        lung cancer   Coronary artery disease Other    Heart attack Other    Cancer Sister        Lung, stomach    Social History   Tobacco Use   Smoking status: Never   Smokeless tobacco: Never  Vaping Use   Vaping Use:  Never used  Substance Use Topics   Alcohol use: No   Drug use: No    Home Medications Prior to Admission medications   Medication Sig Start Date End Date Taking? Authorizing Provider  acetaminophen (TYLENOL) 650 MG CR tablet Take 650 mg by mouth every 8 (eight) hours as needed for pain.    [provider]  amLODipine (NORVASC) 10 MG tablet TAKE 1 TABLET EVERY DAY 06/02/21   Janith Lima, MD  atenolol (TENORMIN) 100 MG tablet TAKE 1 TABLET EVERY DAY 06/02/21   Janith Lima, MD  atorvastatin (LIPITOR) 10 MG tablet TAKE 1 TABLET EVERY DAY 06/02/21   Janith Lima, MD  b complex vitamins tablet Take 1 tablet by mouth daily.    [provider]  Cholecalciferol (VITAMIN D) 2000 UNITS tablet Take 2,000 Units by mouth daily.    [provider]  fexofenadine (ALLEGRA) 180 MG tablet Take 180 mg by mouth daily.    [provider]  folic acid (FOLVITE) 1 MG tablet Take 1 mg by mouth daily.    [provider]  gabapentin (NEURONTIN) 300 MG capsule Take 1 capsule (300 mg total) by mouth 2 (two) times daily as needed (pain). 08/04/21   Hoyt Koch, MD  hydroxychloroquine (PLAQUENIL) 200 MG tablet Take 200 mg by mouth 2 (two) times daily.    Gavin Pound, MD  lidocaine (LIDODERM) 5 % Place 1 patch onto the skin daily. Remove & Discard patch within 12 hours or as directed by MD 08/04/21   Hoyt Koch, MD  meclizine (MEDI-MECLIZINE) 25 MG tablet Take 1 tablet (25 mg total) by mouth 3 (three) times daily as needed for dizziness. 12/30/20   Janith Lima, MD  methotrexate (RHEUMATREX) 2.5 MG tablet Take 15 mg by mouth once a week. Caution:Chemotherapy. Protect from light.  6 tablets Taken on Fridays.    Gavin Pound, MD  traMADol (ULTRAM) 50 MG tablet TAKE 1 TABLET(50 MG) BY MOUTH EVERY 12 HOURS AS NEEDED FOR MODERATE PAIN OR SEVERE PAIN 12/30/20   Janith Lima, MD  valACYclovir (VALTREX) 1000 MG tablet Take 1 tablet (1,000 mg total) by  mouth 3 (three) times daily for 7 days. 08/02/21 08/09/21  Lynden Oxford Scales, PA-C    Allergies    Ibuprofen, Percocet [oxycodone-acetaminophen], Simvastatin, Diltiazem hcl, Hydrochlorothiazide, Lisinopril, and Verapamil  Review of Systems   Review of Systems  Constitutional:  Negative for chills and fever.  HENT:  Negative for congestion and rhinorrhea.   Eyes:  Negative for redness and visual disturbance.  Respiratory:  Negative for shortness of breath and wheezing.   Cardiovascular:  Negative for chest pain and palpitations.  Gastrointestinal:  Negative for nausea and  vomiting.  Genitourinary:  Negative for dysuria and urgency.  Musculoskeletal:  Negative for arthralgias and myalgias.  Skin:  Negative for pallor and wound.  Neurological:  Positive for weakness. Negative for dizziness and headaches.   Physical Exam Updated Vital Signs BP (!) 163/64   Pulse 61   Temp 98.3 F (36.8 C) (Oral)   Resp 16   SpO2 98%   Physical Exam Vitals and nursing note reviewed.  Constitutional:      General: She is not in acute distress.    Appearance: She is well-developed. She is not diaphoretic.  HENT:     Head: Normocephalic and atraumatic.  Eyes:     Pupils: Pupils are equal, round, and reactive to light.  Cardiovascular:     Rate and Rhythm: Normal rate and regular rhythm.     Heart sounds: No murmur heard.   No friction rub. No gallop.  Pulmonary:     Effort: Pulmonary effort is normal.     Breath sounds: No wheezing or rales.  Abdominal:     General: There is no distension.     Palpations: Abdomen is soft.     Tenderness: There is no abdominal tenderness.  Musculoskeletal:        General: No tenderness.     Cervical back: Normal range of motion and neck supple.  Skin:    General: Skin is warm and dry.  Neurological:     Mental Status: She is alert and oriented to person, place, and time.     Comments: Short-lived tremor when the patient attempted to reposition  herself in the wheelchair.  She was able to stand without any significant tremor and pivot and sit in the bed.  She has no cogwheeling no hyperreflexia able to range her neck without issue.  Psychiatric:        Behavior: Behavior normal.    ED Results / Procedures / Treatments   Labs (all labs ordered are listed, but only abnormal results are displayed) Labs Reviewed  COMPREHENSIVE METABOLIC PANEL - Abnormal; Notable for the following components:      Result Value   Potassium 5.2 (*)    Glucose, Bld 119 (*)    BUN 45 (*)    Creatinine, Ser 3.48 (*)    AST 47 (*)    GFR, Estimated 12 (*)    All other components within normal limits  CBC WITH DIFFERENTIAL/PLATELET - Abnormal; Notable for the following components:   WBC 11.9 (*)    RDW 15.7 (*)    Neutro Abs 8.8 (*)    Monocytes Absolute 1.1 (*)    All other components within normal limits  RESP PANEL BY RT-PCR (FLU A&B, COVID) ARPGX2  LACTIC ACID, PLASMA  LACTIC ACID, PLASMA  URINALYSIS, ROUTINE W REFLEX MICROSCOPIC    EKG EKG Interpretation  Date/Time:  Thursday August 07 2021 16:02:55 EDT Ventricular Rate:  66 PR Interval:  130 QRS Duration: 72 QT Interval:  394 QTC Calculation: 413 R Axis:   1 Text Interpretation: Sinus rhythm with occasional Premature ventricular complexes Minimal voltage criteria for LVH, may be normal variant ( R in aVL ) Borderline ECG Otherwise no significant change Confirmed by Deno Etienne [phone]) on 08/08/2021 2:48:36 AM  Radiology DG Chest 1 View  Result Date: 08/07/2021 CLINICAL DATA:  Weakness. EXAM: CHEST  1 VIEW COMPARISON:  Chest x-ray 02/27/2017. FINDINGS: The heart size and mediastinal contours are within normal limits. Both lungs are clear. The visualized skeletal structures are unremarkable. IMPRESSION:  No active disease. Electronically Signed   By: Ronney Asters M.D.   On: 08/07/2021 17:19   CT Head Wo Contrast  Result Date: 08/07/2021 CLINICAL DATA:  Neuro deficit, acute, stroke  suspected EXAM: CT HEAD WITHOUT CONTRAST TECHNIQUE: Contiguous axial images were obtained from the base of the skull through the vertex without intravenous contrast. COMPARISON:  04/28/2019 FINDINGS: Brain: There is atrophy and chronic small vessel disease changes. No acute intracranial abnormality. Specifically, no hemorrhage, hydrocephalus, mass lesion, acute infarction, or significant intracranial injury. Small bilateral chronic subdural hygromas are again noted, unchanged. Vascular: No hyperdense vessel or unexpected calcification. Skull: No acute calvarial abnormality. Sinuses/Orbits: No acute findings Other: None IMPRESSION: Atrophy, chronic microvascular disease. No acute intracranial abnormality. Stable small bilateral chronic subdural hygromas. Electronically Signed   By: Rolm Baptise M.D.   On: 08/07/2021 18:49    Procedures Procedures   Medications Ordered in ED Medications  lactated ringers bolus 1,000 mL (has no administration in time range)    Followed by  lactated ringers infusion (has no administration in time range)    ED Course  I have reviewed the triage vital signs and the nursing notes.  Pertinent labs & imaging results that were available during my care of the patient were reviewed by me and considered in my medical decision making (see chart for details).    MDM Rules/Calculators/A&P                           87 yo F with a chief complaints of fatigue and tremors.  Based on history and exam it seems the patient is perhaps very fatigued and that is why when she tries to move she experiences tremor sometimes.  Her electrolytes here are without significant issue however she has quite a insult to her renal function.  Baseline of 0.9 now down to 3-1/2.  Likely dehydration though patient not endorsing any decreased oral intake.  She feels like she is urinating normally.  We will obtain a bladder scan.  Give a bolus of IV fluids will discuss with medicine for admission.  The  patients results and plan were reviewed and discussed.   Any x-rays performed were independently reviewed by myself.   Differential diagnosis were considered with the presenting HPI.  Medications  0.9 %  sodium chloride infusion (0 mLs Intravenous Stopped 08/09/21 0541)  lactated ringers bolus 1,000 mL (0 mLs Intravenous Stopped 08/08/21 0450)  sodium chloride 0.9 % bolus 500 mL (0 mLs Intravenous Stopped 08/08/21 0922)  potassium chloride SA (KLOR-CON) CR tablet 40 mEq (40 mEq Oral Given 08/08/21 1322)    Vitals:   08/09/21 2139 08/10/21 0556 08/10/21 0927 08/10/21 1012  BP: (!) 157/58 (!) 165/54 (!) 140/51 (!) 179/60  Pulse: 61 (!) 55 60 (!) 59  Resp: 16 16 18    Temp: 98.1 F (36.7 C) 97.6 F (36.4 C) 97.7 F (36.5 C) 98.7 F (37.1 C)  TempSrc: Oral Oral Oral Oral  SpO2: 100% 99% 98% 97%  Weight:      Height:        Final diagnoses:  ARF (acute renal failure) ([place])    Admission/ observation were discussed with the admitting physician, patient and/or family and they are comfortable with the plan.    Final Clinical Impression(s) / ED Diagnoses Final diagnoses:  None    Rx / DC Orders ED Discharge Orders     None        Tyrone Nine,  Linna Hoff, [place] 08/11/21 [phone]

## 2021-08-08 NOTE — Telephone Encounter (Signed)
Called pt. LDVM with Dr. Gwynn Burly recommendations.

## 2021-08-08 NOTE — H&P (Signed)
History and Physical    Briana [name] SEG:[id number] DOB: [birth year] DOA: 08/07/2021  PCP: Janith Lima, MD  Patient coming from: Home.  Chief Complaint: Shaking spells.  HPI: Briana [name] is a 87 y.o. female with history of rheumatoid arthritis, hypertension, hyperlipidemia, diabetes mellitus was recently diagnosed with shingles infection about a week ago and was started on Valtrex about 7 days ago and 2 days later patient was started on gabapentin for significant pain.  The shingles were involving the left flank area.  Per patient's son patient's pain improved with gabapentin.  But yesterday when he checked on her she was appearing weak and has been having some shaking spells when she tries to walk and this was concerning and was brought to the ER.  Did not have any nausea vomiting diarrhea or any other medication change other than the Valtrex and gabapentin.  ED Course: In the ER patient was afebrile.  There is left flank rash consistent with shingles.  Labs show WBC of 1.9 lactic acid mildly elevated from the first 1 of 1.5 it is around 2 creatinine has significantly increased to 3.48 from normal in March potassium of 5.2 bicarb of 23 COVID test was negative UA shows a WBC of 21-50 with few bacteria and leukocyte esterase CT head was unremarkable.  Patient was given fluid bolus and admitted for acute renal failure.  Review of Systems: As per HPI, rest all negative.   Past Medical History:  Diagnosis Date   ACUTE URIS OF UNSPECIFIED SITE 11/26/2008   ALLERGIC ARTHRITIS OTHER SPECIFIED SITES 10/22/2010   OA + RA   ALLERGIC RHINITIS 10/28/2007   ARTHRITIS, KNEES, BILATERAL 10/28/2007   BACK PAIN 04/12/2009   BUNIONECTOMY, HX OF 10/28/2007   CELLULITIS&ABSCESS OF HAND EXCEPT FINGERS&THUMB 7/37/1062   Complication of anesthesia    itching and makes me crazy   Family history of anesthesia complication    DAUGHTER HAD HIVES AND " WENT WILD "   HYPERLIPIDEMIA 10/28/2007   HYPERTENSION  10/28/2007   INSOMNIA UNSPECIFIED 10/31/2007   MYOSITIS 10/22/2010   NECK PAIN 12/03/2008   Pain in joint, site unspecified 10/07/2010   WRIST PAIN, LEFT 10/22/2010    Past Surgical History:  Procedure Laterality Date   arthroscopic surgery and rotator cuff repair left shoulder     arthroscopy right knee hx of surgery     BUNIONECTOMY     hx of   FOOT SURGERY     RIGHT   hand surgery for broken finger, remote     JOINT REPLACEMENT Right    knee   TOTAL KNEE ARTHROPLASTY  07/01/2012   Procedure: TOTAL KNEE ARTHROPLASTY;  Surgeon: Yvette Rack., MD;  Location: [facility];  Service: Orthopedics;  Laterality: Right;  right total knee arthroplasty   TOTAL KNEE ARTHROPLASTY Left 06/13/2014   dr Percell Miller   TOTAL KNEE ARTHROPLASTY Left 06/13/2014   Procedure: TOTAL KNEE ARTHROPLASTY;  Surgeon: Ninetta Lights, MD;  Location: [facility];  Service: Orthopedics;  Laterality: Left;     reports that she has never smoked. She has never used smokeless tobacco. She reports that she does not drink alcohol and does not use drugs.  Allergies  Allergen Reactions   Ibuprofen Anaphylaxis   Percocet [Oxycodone-Acetaminophen]     nausea   Simvastatin Other (See Comments)    muscle aches   Diltiazem Hcl     REACTION: ANGIO EDEMA   Hydrochlorothiazide     REACTION: SWELLING   Lisinopril  REACTION: SWELLING   Verapamil     REACTION: SWELLING    Family History  Problem Relation Age of Onset   Cancer Brother        lung cancer   Coronary artery disease Other    Heart attack Other    Cancer Sister        Lung, stomach    Prior to Admission medications   Medication Sig Start Date End Date Taking? Authorizing Provider  acetaminophen (TYLENOL) 650 MG CR tablet Take 650 mg by mouth every 8 (eight) hours as needed for pain.   Yes [provider]  amLODipine (NORVASC) 10 MG tablet TAKE 1 TABLET EVERY DAY Patient taking differently: Take 10 mg by mouth daily. 06/02/21  Yes Janith Lima, MD   atenolol (TENORMIN) 100 MG tablet TAKE 1 TABLET EVERY DAY Patient taking differently: Take 100 mg by mouth daily. 06/02/21  Yes Janith Lima, MD  b complex vitamins tablet Take 1 tablet by mouth daily.   Yes [provider]  Cholecalciferol (VITAMIN D) 2000 UNITS tablet Take 2,000 Units by mouth daily.   Yes [provider]  dorzolamide-timolol (COSOPT) 22.3-6.8 MG/ML ophthalmic solution Place 1 drop into both eyes 2 (two) times daily.   Yes [provider]  fexofenadine (ALLEGRA) 180 MG tablet Take 180 mg by mouth daily as needed for allergies.   Yes [provider]  folic acid (FOLVITE) 1 MG tablet Take 1 mg by mouth daily.   Yes [provider]  gabapentin (NEURONTIN) 300 MG capsule Take 1 capsule (300 mg total) by mouth 2 (two) times daily as needed (pain). 08/04/21  Yes Hoyt Koch, MD  hydroxychloroquine (PLAQUENIL) 200 MG tablet Take 200 mg by mouth 2 (two) times daily.   Yes Gavin Pound, MD  latanoprost (XALATAN) 0.005 % ophthalmic solution Place 1 drop into both eyes at bedtime.   Yes [provider]  meclizine (MEDI-MECLIZINE) 25 MG tablet Take 1 tablet (25 mg total) by mouth 3 (three) times daily as needed for dizziness. 12/30/20  Yes Janith Lima, MD  methotrexate (RHEUMATREX) 2.5 MG tablet Take 15 mg by mouth every Friday. Caution:Chemotherapy. Protect from light.  6 tablets Taken on Fridays.   Yes Gavin Pound, MD  Netarsudil Dimesylate (RHOPRESSA) 0.02 % SOLN Place 1 drop into the right eye at bedtime.   Yes [provider]  traMADol (ULTRAM) 50 MG tablet TAKE 1 TABLET(50 MG) BY MOUTH EVERY 12 HOURS AS NEEDED FOR MODERATE PAIN OR SEVERE PAIN Patient taking differently: Take 50 mg by mouth every 12 (twelve) hours as needed for moderate pain. 12/30/20  Yes Janith Lima, MD  atorvastatin (LIPITOR) 10 MG tablet TAKE 1 TABLET EVERY DAY Patient taking differently: Take 10 mg by mouth at bedtime. 06/02/21    Janith Lima, MD  lidocaine (LIDODERM) 5 % Place 1 patch onto the skin daily. Remove & Discard patch within 12 hours or as directed by MD Patient not taking: No sig reported 08/04/21   Hoyt Koch, MD  valACYclovir (VALTREX) 1000 MG tablet Take 1 tablet (1,000 mg total) by mouth 3 (three) times daily for 7 days. Patient not taking: No sig reported 08/02/21 08/09/21  Lynden Oxford Scales, PA-C    Physical Exam: Constitutional: Moderately built and nourished. Vitals:   08/08/21 0035 08/08/21 0515 08/08/21 0530 08/08/21 0615  BP: (!) 163/64 140/84 (!) 159/60 (!) 155/80  Pulse: 61 72 68 70  Resp: 16   18  Temp:      TempSrc:      SpO2: 98% 99% 99% 98%   Eyes: Anicteric no pallor. ENMT: No discharge from the ears eyes nose and mouth. Neck: No neck rigidity. Respiratory: No rhonchi or crepitations. Cardiovascular: S1-S2 heard. Abdomen: Soft nontender bowel sounds present. Musculoskeletal: No edema. Skin: Rash on the left flank. Neurologic: Alert awake oriented to time place and person.  Moves all extremities. Psychiatric: Appears normal.  Normal affect.   Labs on Admission: I have personally reviewed following labs and imaging studies  CBC: Recent Labs  Lab 08/07/21 1658  WBC 11.9*  NEUTROABS 8.8*  HGB 13.3  HCT 39.0  MCV 93.5  PLT 242   Basic Metabolic Panel: Recent Labs  Lab 08/07/21 1658  NA 138  K 5.2*  CL 106  CO2 23  GLUCOSE 119*  BUN 45*  CREATININE 3.48*  CALCIUM 9.3   GFR: Estimated Creatinine Clearance: 11.3 mL/min (A) (by C-G formula based on SCr of 3.48 mg/dL (H)). Liver Function Tests: Recent Labs  Lab 08/07/21 1658  AST 47*  ALT 30  ALKPHOS 89  BILITOT 1.1  PROT 7.1  ALBUMIN 3.7   No results for input(s): LIPASE, AMYLASE in the last 168 hours. No results for input(s): AMMONIA in the last 168 hours. Coagulation Profile: No results for input(s): INR, PROTIME in the last 168 hours. Cardiac Enzymes: No results for input(s):  CKTOTAL, CKMB, CKMBINDEX, TROPONINI in the last 168 hours. BNP (last 3 results) No results for input(s): PROBNP in the last 8760 hours. HbA1C: No results for input(s): HGBA1C in the last 72 hours. CBG: No results for input(s): GLUCAP in the last 168 hours. Lipid Profile: No results for input(s): CHOL, HDL, LDLCALC, TRIG, CHOLHDL, LDLDIRECT in the last 72 hours. Thyroid Function Tests: No results for input(s): TSH, T4TOTAL, FREET4, T3FREE, THYROIDAB in the last 72 hours. Anemia Panel: No results for input(s): VITAMINB12, FOLATE, FERRITIN, TIBC, IRON, RETICCTPCT in the last 72 hours. Urine analysis:    Component Value Date/Time   COLORURINE YELLOW 08/08/2021 0434   APPEARANCEUR HAZY (A) 08/08/2021 0434   LABSPEC 1.008 08/08/2021 0434   PHURINE 5.0 08/08/2021 0434   GLUCOSEU NEGATIVE 08/08/2021 0434   GLUCOSEU NEGATIVE 12/30/2020 1031   HGBUR NEGATIVE 08/08/2021 [place] 08/08/2021 0434   KETONESUR NEGATIVE 08/08/2021 0434   PROTEINUR NEGATIVE 08/08/2021 0434   UROBILINOGEN 0.2 12/30/2020 1031   NITRITE NEGATIVE 08/08/2021 0434   LEUKOCYTESUR MODERATE (A) 08/08/2021 0434   Sepsis Labs: @LABRCNTIP (procalcitonin:4,lacticidven:4) ) Recent Results (from the past 240 hour(s))  Resp Panel by RT-PCR (Flu A&B, Covid) Nasopharyngeal Swab     Status: None   Collection Time: 08/08/21  3:40 AM   Specimen: Nasopharyngeal Swab; Nasopharyngeal(NP) swabs in vial transport medium  Result Value Ref Range Status   SARS Coronavirus 2 by RT PCR NEGATIVE NEGATIVE Final    Comment: (NOTE) SARS-CoV-2 target nucleic acids are NOT DETECTED.  The SARS-CoV-2 RNA is generally detectable in upper respiratory specimens during the acute phase of infection. The lowest concentration of SARS-CoV-2 viral copies this assay can detect is 138 copies/mL. A negative result does not preclude SARS-Cov-2 infection and should not be used as the sole basis for treatment or other patient management  decisions. A negative result may occur with  improper specimen collection/handling, submission of specimen other than nasopharyngeal swab, presence of viral mutation(s) within the areas targeted by this assay, and inadequate number of viral copies(<138 copies/mL). A negative result must be  combined with clinical observations, patient history, and epidemiological information. The expected result is Negative.  Fact Sheet for Patients:  EntrepreneurPulse.com.au  Fact Sheet for Healthcare Providers:  IncredibleEmployment.be  This test is no t yet approved or cleared by the [place] FDA and  has been authorized for detection and/or diagnosis of SARS-CoV-2 by FDA under an Emergency Use Authorization (EUA). This EUA will remain  in effect (meaning this test can be used) for the duration of the COVID-19 declaration under Section 564(b)(1) of the Act, 21 U.S.C.section 360bbb-3(b)(1), unless the authorization is terminated  or revoked sooner.       Influenza A by PCR NEGATIVE NEGATIVE Final   Influenza B by PCR NEGATIVE NEGATIVE Final    Comment: (NOTE) The Xpert Xpress SARS-CoV-2/FLU/RSV plus assay is intended as an aid in the diagnosis of influenza from Nasopharyngeal swab specimens and should not be used as a sole basis for treatment. Nasal washings and aspirates are unacceptable for Xpert Xpress SARS-CoV-2/FLU/RSV testing.  Fact Sheet for Patients: EntrepreneurPulse.com.au  Fact Sheet for Healthcare Providers: IncredibleEmployment.be  This test is not yet approved or cleared by the [place] FDA and has been authorized for detection and/or diagnosis of SARS-CoV-2 by FDA under an Emergency Use Authorization (EUA). This EUA will remain in effect (meaning this test can be used) for the duration of the COVID-19 declaration under Section 564(b)(1) of the Act, 21 U.S.C. section 360bbb-3(b)(1), unless the  authorization is terminated or revoked.  Performed at [facility], [place] [address], [place], [place] [address]      Radiological Exams on Admission: DG Chest 1 View  Result Date: 08/07/2021 CLINICAL DATA:  Weakness. EXAM: CHEST  1 VIEW COMPARISON:  Chest x-ray 02/27/2017. FINDINGS: The heart size and mediastinal contours are within normal limits. Both lungs are clear. The visualized skeletal structures are unremarkable. IMPRESSION: No active disease. Electronically Signed   By: Ronney Asters M.D.   On: 08/07/2021 17:19   CT Head Wo Contrast  Result Date: 08/07/2021 CLINICAL DATA:  Neuro deficit, acute, stroke suspected EXAM: CT HEAD WITHOUT CONTRAST TECHNIQUE: Contiguous axial images were obtained from the base of the skull through the vertex without intravenous contrast. COMPARISON:  04/28/2019 FINDINGS: Brain: There is atrophy and chronic small vessel disease changes. No acute intracranial abnormality. Specifically, no hemorrhage, hydrocephalus, mass lesion, acute infarction, or significant intracranial injury. Small bilateral chronic subdural hygromas are again noted, unchanged. Vascular: No hyperdense vessel or unexpected calcification. Skull: No acute calvarial abnormality. Sinuses/Orbits: No acute findings Other: None IMPRESSION: Atrophy, chronic microvascular disease. No acute intracranial abnormality. Stable small bilateral chronic subdural hygromas. Electronically Signed   By: Rolm Baptise M.D.   On: 08/07/2021 18:49    EKG: Independently reviewed.  Normal sinus rhythm with PVCs.  Assessment/Plan Principal Problem:   ARF (acute renal failure) (HCC) Active Problems:   Essential hypertension, benign   RA (rheumatoid arthritis) ([place])   Type II diabetes mellitus with manifestations ([place])   Shingles    Acute renal failure -patient's creatinine has significantly worsened from normal in March 2022 it was 0.9 it is around 3.48 now.  Discussed with pharmacy Valtrex can cause  renal failure which likely could be the reason for the renal failure.  Will hydrate check renal ultrasound to make sure there is no obstructive cause.  UA does not show any casts or proteins.  Follow intake output and metabolic panel.  Check FENa and CK levels. Shingles -patient has taken 6 days of Valtrex and  holding her Valtrex due to possible nephrotoxicity.  Patient pain was controlled with gabapentin but holding gabapentin due to acute renal failure at this time.  I have placed patient on morphine for pain now. Hypertension on amlodipine and beta-blockers. Diabetes mellitus type 2 presently not on any medication last hemoglobin A1c about 7 months ago was 6.3.  We will keep patient on sliding scale coverage and check hemoglobin A1c. History of rheumatoid arthritis on methotrexate and Plaquenil holding now in the setting of infection. Hyperlipidemia on statins.  Check CK levels. Recent breast biopsy. Possible UTI we will keep patient on ceftriaxone follow cultures.  Since patient has significant acute renal failure with multiple comorbidities will need close monitoring for any further worsening inpatient status.   DVT prophylaxis: Heparin. Code Status: Full code. Family Communication: Patient's son. Disposition Plan: Home. Consults called: None. Admission status: Inpatient.   Rise Patience MD [facility] Pager [phone].  If 7PM-7AM, please contact night-coverage [email or web address] Password [facility]  08/08/2021, 6:28 AM

## 2021-08-08 NOTE — ED Notes (Signed)
Breakfast tray at bedside 

## 2021-08-08 NOTE — Progress Notes (Signed)
PROGRESS NOTE        PATIENT DETAILS Name: Briana [name] Age: 87 y.o. Sex: female Date of Birth: [birth year] Admit Date: 08/07/2021 Admitting Physician Rise Patience, MD ZJI:RCVEL, Arvid Right, MD  Brief Narrative: Patient is a 87 y.o. female with history of RA, HTN, HLD, DM-2-who was diagnosed with shingles approximately 7 days back and started on Valtrex/Neurontin-presented with generalized weakness/tremors/unsteady gait-was found to have AKI-see below for further details.  Subjective: Lying comfortably in bed-denies any chest pain or shortness of breath.  Objective: Vitals: Blood pressure (!) 166/68, pulse 70, temperature 98.3 F (36.8 C), temperature source Oral, resp. rate 18, height 5\' 4"  (1.626 m), weight 76.2 kg, SpO2 97 %.   Exam: Gen Exam:Alert awake-not in any distress HEENT:atraumatic, normocephalic Chest: B/L clear to auscultation anteriorly CVS:S1S2 regular Abdomen:soft non tender, non distended Extremities:no edema Neurology: Non focal Skin: Shingles rash in left flank has already crusted.  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  Continue to follow renal function closely.  UA bland-renal ultrasound without any hydronephrosis.  Unsteady gait/tremors: Probably due to Neurontin toxicity in the setting of worsening renal failure.  Neurontin held.  Symptoms already improving.  Ambulate with physical therapy and see how she does.  Recent history of shingles in the left flank area: Supportive care-we will continue to have neuropathic pain from neck several weeks.  Will apply Lidoderm patch.  HTN: BP stable-continue beta-blocker and  amlodipine.  DM-2: CBG stable-continue SSI  Recent Labs    08/08/21 0802 08/08/21 1149  GLUCAP 125* 99     HLD: Continue statin  Asymptomatic bacteriuria: Stop Rocephin-history not suggestive of UTI.  Rheumatoid arthritis: Continue Plaquenil hold methotrexate.  Obesity: Estimated body mass index is 28.84 kg/m as calculated from the following:   Height as of this encounter: 5\' 4"  (1.626 m).   Weight as of this encounter: 76.2 kg.    Procedures: None Consults: None DVT Prophylaxis: SQ Heparin Code Status:Full code  Family Communication: None at bedside  Time spent: 35 minutes-Greater than 50% of this time was spent in counseling, explanation of diagnosis, planning of further management, and coordination of care.  Diet: Diet Order             Diet heart healthy/carb modified Room service appropriate? Yes; Fluid consistency: Thin  Diet effective now                      Disposition Plan: Status is: Inpatient  Remains inpatient appropriate because: AKI/unsteady gait-not yet stable for discharge.    Antimicrobial agents: Anti-infectives (From admission, onward)    Start     Dose/Rate Route Frequency Ordered Stop   08/08/21 0645  cefTRIAXone (ROCEPHIN) 1 g in sodium chloride 0.9 % 100 mL IVPB        1 g 200 mL/hr over 30 Minutes Intravenous Daily 08/08/21 0642          MEDICATIONS: Scheduled Meds:  amLODipine  10 mg Oral Daily   atenolol  100 mg Oral Daily   atorvastatin  10 mg Oral QHS   dorzolamide-timolol  1 drop Both Eyes BID   folic acid  1 mg Oral Daily   heparin  5,000 Units Subcutaneous Q8H   insulin aspart  0-9 Units Subcutaneous TID WC   latanoprost  1 drop Both Eyes QHS   Netarsudil Dimesylate  1 drop Right Eye QHS   Continuous Infusions:  sodium chloride 125 mL/hr at 08/08/21 0922   cefTRIAXone (ROCEPHIN)  IV Stopped (08/08/21 0857)   PRN Meds:.acetaminophen **OR** acetaminophen, morphine injection   I have personally reviewed  following labs and imaging studies  LABORATORY DATA: CBC: Recent Labs  Lab 08/07/21 1658 08/08/21 0800  WBC 11.9* 11.7*  NEUTROABS 8.8* 8.8*  HGB 13.3 13.9  HCT 39.0 39.6  MCV 93.5 91.2  PLT 168 749    Basic Metabolic Panel: Recent Labs  Lab 08/07/21 1658 08/08/21 0701  NA 138 142  K 5.2* 3.4*  CL 106 108  CO2 23 22  GLUCOSE 119* 144*  BUN 45* 37*  CREATININE 3.48* 2.72*  CALCIUM 9.3 9.8    GFR: Estimated Creatinine Clearance: 14.6 mL/min (A) (by C-G formula based on SCr of 2.72 mg/dL (H)).  Liver Function Tests: Recent Labs  Lab 08/07/21 1658 08/08/21 0701  AST 47* 40  ALT 30 39  ALKPHOS 89 86  BILITOT 1.1 0.7  PROT 7.1 7.4  ALBUMIN 3.7 3.7   No results for input(s): LIPASE, AMYLASE in the last 168 hours. No results for input(s): AMMONIA in the last 168 hours.  Coagulation Profile: No results for input(s): INR, PROTIME in the last 168 hours.  Cardiac Enzymes: Recent Labs  Lab 08/08/21 0800  CKTOTAL 125    BNP (last 3 results) No results for input(s): PROBNP in the last 8760 hours.  Lipid Profile: No results for input(s): CHOL, HDL, LDLCALC, TRIG, CHOLHDL, LDLDIRECT in the last 72 hours.  Thyroid Function Tests: No results for input(s): TSH, T4TOTAL, FREET4, T3FREE, THYROIDAB in the last 72 hours.  Anemia Panel: No results for input(s): VITAMINB12, FOLATE, FERRITIN, TIBC, IRON, RETICCTPCT in the last 72 hours.  Urine analysis:    Component Value Date/Time   COLORURINE YELLOW 08/08/2021 0434   APPEARANCEUR HAZY (A) 08/08/2021 0434   LABSPEC 1.008 08/08/2021 0434   PHURINE 5.0 08/08/2021 0434   GLUCOSEU NEGATIVE 08/08/2021 0434   GLUCOSEU NEGATIVE 12/30/2020 1031   HGBUR NEGATIVE 08/08/2021 [place] 08/08/2021 0434   KETONESUR NEGATIVE 08/08/2021 0434   PROTEINUR NEGATIVE 08/08/2021 0434   UROBILINOGEN 0.2 12/30/2020 1031   NITRITE NEGATIVE 08/08/2021 0434   LEUKOCYTESUR MODERATE (A) 08/08/2021 0434    Sepsis  Labs: Lactic Acid, Venous    Component Value Date/Time   LATICACIDVEN 2.0 ([place]) 08/08/2021 0339    MICROBIOLOGY: Recent Results (from the past 240 hour(s))  Resp Panel by RT-PCR (Flu A&B, Covid) Nasopharyngeal Swab     Status: None   Collection Time: 08/08/21  3:40 AM   Specimen: Nasopharyngeal Swab; Nasopharyngeal(NP) swabs in vial transport medium  Result Value Ref Range Status   SARS Coronavirus 2 by RT PCR NEGATIVE NEGATIVE Final    Comment: (NOTE) SARS-CoV-2 target nucleic acids are NOT DETECTED.  The SARS-CoV-2 RNA is generally detectable in upper respiratory specimens during the acute phase of infection. The lowest concentration of SARS-CoV-2 viral copies this assay can detect is 138 copies/mL. A negative result does not preclude SARS-Cov-2 infection and should not be used as the sole basis for treatment or other patient management decisions. A negative result may occur with  improper specimen collection/handling, submission of specimen other than nasopharyngeal swab, presence of  viral mutation(s) within the areas targeted by this assay, and inadequate number of viral copies(<138 copies/mL). A negative result must be combined with clinical observations, patient history, and epidemiological information. The expected result is Negative.  Fact Sheet for Patients:  EntrepreneurPulse.com.au  Fact Sheet for Healthcare Providers:  IncredibleEmployment.be  This test is no t yet approved or cleared by the [place] FDA and  has been authorized for detection and/or diagnosis of SARS-CoV-2 by FDA under an Emergency Use Authorization (EUA). This EUA will remain  in effect (meaning this test can be used) for the duration of the COVID-19 declaration under Section 564(b)(1) of the Act, 21 U.S.C.section 360bbb-3(b)(1), unless the authorization is terminated  or revoked sooner.       Influenza A by PCR NEGATIVE NEGATIVE Final   Influenza B by  PCR NEGATIVE NEGATIVE Final    Comment: (NOTE) The Xpert Xpress SARS-CoV-2/FLU/RSV plus assay is intended as an aid in the diagnosis of influenza from Nasopharyngeal swab specimens and should not be used as a sole basis for treatment. Nasal washings and aspirates are unacceptable for Xpert Xpress SARS-CoV-2/FLU/RSV testing.  Fact Sheet for Patients: EntrepreneurPulse.com.au  Fact Sheet for Healthcare Providers: IncredibleEmployment.be  This test is not yet approved or cleared by the [place] FDA and has been authorized for detection and/or diagnosis of SARS-CoV-2 by FDA under an Emergency Use Authorization (EUA). This EUA will remain in effect (meaning this test can be used) for the duration of the COVID-19 declaration under Section 564(b)(1) of the Act, 21 U.S.C. section 360bbb-3(b)(1), unless the authorization is terminated or revoked.  Performed at [facility], [place] [address], [place], [place] [address]   Culture, blood (routine x 2)     Status: None (Preliminary result)   Collection Time: 08/08/21  8:04 AM   Specimen: BLOOD RIGHT WRIST  Result Value Ref Range Status   Specimen Description BLOOD RIGHT WRIST  Final   Special Requests   Final    BOTTLES DRAWN AEROBIC AND ANAEROBIC Blood Culture adequate volume   Culture   Final    NO GROWTH < 12 HOURS Performed at [facility], [place] [address], [place], [place] [address]    Report Status PENDING  Incomplete  Culture, blood (routine x 2)     Status: None (Preliminary result)   Collection Time: 08/08/21  8:24 AM   Specimen: BLOOD  Result Value Ref Range Status   Specimen Description BLOOD SITE NOT SPECIFIED  Final   Special Requests   Final    BOTTLES DRAWN AEROBIC AND ANAEROBIC Blood Culture results may not be optimal due to an excessive volume of blood received in culture bottles   Culture   Final    NO GROWTH < 12 HOURS Performed at [facility], [place]  [address] [place], [place] [address]    Report Status PENDING  Incomplete    RADIOLOGY STUDIES/RESULTS: DG Chest 1 View  Result Date: 08/07/2021 CLINICAL DATA:  Weakness. EXAM: CHEST  1 VIEW COMPARISON:  Chest x-ray 02/27/2017. FINDINGS: The heart size and mediastinal contours are within normal limits. Both lungs are clear. The visualized skeletal structures are unremarkable. IMPRESSION: No active disease. Electronically Signed   By: Ronney Asters M.D.   On: 08/07/2021 17:19   CT Head Wo Contrast  Result Date: 08/07/2021 CLINICAL DATA:  Neuro deficit, acute, stroke suspected EXAM: CT HEAD WITHOUT CONTRAST TECHNIQUE: Contiguous axial images were obtained from the base of the skull through the vertex  without intravenous contrast. COMPARISON:  04/28/2019 FINDINGS: Brain: There is atrophy and chronic small vessel disease changes. No acute intracranial abnormality. Specifically, no hemorrhage, hydrocephalus, mass lesion, acute infarction, or significant intracranial injury. Small bilateral chronic subdural hygromas are again noted, unchanged. Vascular: No hyperdense vessel or unexpected calcification. Skull: No acute calvarial abnormality. Sinuses/Orbits: No acute findings Other: None IMPRESSION: Atrophy, chronic microvascular disease. No acute intracranial abnormality. Stable small bilateral chronic subdural hygromas. Electronically Signed   By: Rolm Baptise M.D.   On: 08/07/2021 18:49   US RENAL  Result Date: 08/08/2021 CLINICAL DATA:  Acute renal failure EXAM: RENAL / URINARY TRACT ULTRASOUND COMPLETE COMPARISON:  None. FINDINGS: Right Kidney: Renal measurements: 11.2 cm x 3.7 cm x 4.8 cm = volume: 104 mL. Echogenicity within normal limits. No mass or hydronephrosis visualized. Left Kidney: Renal measurements: 10.0 cm x 5.8 cm x 3.7 cm = volume: 113 mL. Echogenicity within normal limits. No mass or hydronephrosis visualized. Bladder: Appears normal for degree of bladder distention. Other: None. IMPRESSION:  Normal renal ultrasound. Electronically Signed   By: Valetta Mole M.D.   On: 08/08/2021 07:51     LOS: 0 days   Oren Binet, MD  [facility]    To contact the attending provider between 7A-7P or the covering provider during after hours 7P-7A, please log into the web site [email or web address] and access using universal [place] password for that web site. If you do not have the password, please call the hospital operator.  08/08/2021, 11:59 AM

## 2021-08-09 DIAGNOSIS — E118 Type 2 diabetes mellitus with unspecified complications: Secondary | ICD-10-CM | POA: Diagnosis not present

## 2021-08-09 DIAGNOSIS — I10 Essential (primary) hypertension: Secondary | ICD-10-CM | POA: Diagnosis not present

## 2021-08-09 DIAGNOSIS — M05711 Rheumatoid arthritis with rheumatoid factor of right shoulder without organ or systems involvement: Secondary | ICD-10-CM | POA: Diagnosis not present

## 2021-08-09 DIAGNOSIS — N179 Acute kidney failure, unspecified: Secondary | ICD-10-CM | POA: Diagnosis not present

## 2021-08-09 LAB — BASIC METABOLIC PANEL
Anion gap: 8 (ref 5–15)
BUN: 32 mg/dL — ABNORMAL HIGH (ref 8–23)
CO2: 22 mmol/L (ref 22–32)
Calcium: 8.8 mg/dL — ABNORMAL LOW (ref 8.9–10.3)
Chloride: 111 mmol/L (ref 98–111)
Creatinine, Ser: 2.18 mg/dL — ABNORMAL HIGH (ref 0.44–1.00)
GFR, Estimated: 21 mL/min — ABNORMAL LOW (ref >60)
Glucose, Bld: 110 mg/dL — ABNORMAL HIGH (ref 70–99)
Potassium: 3.9 mmol/L (ref 3.5–5.1)
Sodium: 141 mmol/L (ref 135–145)

## 2021-08-09 LAB — GLUCOSE, CAPILLARY
Glucose-Capillary: 103 mg/dL — ABNORMAL HIGH (ref 70–99)
Glucose-Capillary: 151 mg/dL — ABNORMAL HIGH (ref 70–99)
Glucose-Capillary: 88 mg/dL (ref 70–99)
Glucose-Capillary: 105 mg/dL — ABNORMAL HIGH (ref 70–99)

## 2021-08-09 MED ORDER — AMLODIPINE BESYLATE 10 MG PO TABS
10.0000 mg | ORAL_TABLET | Freq: Every day | ORAL | Status: DC
  Administered 2021-08-09 – 2021-08-10 (×2): 10 mg via ORAL
  Filled 2021-08-09 (×2): qty 1

## 2021-08-09 MED ORDER — ATENOLOL 50 MG PO TABS
100.0000 mg | ORAL_TABLET | Freq: Every day | ORAL | Status: DC
  Administered 2021-08-09 – 2021-08-10 (×2): 100 mg via ORAL
  Filled 2021-08-09 (×2): qty 2

## 2021-08-09 NOTE — Evaluation (Signed)
Physical Therapy Evaluation Patient Details Name: Briana [name] MRN: [id number] DOB: 13-Jan-1931 Today's Date: 08/09/2021  History of Present Illness  87 yo female with onset of shingles outbreak a week ago was admitted on 10/27 for onset of general weakness, tremors, and unsteady gait.  Noted AKI suspected from shingles meds, but does continue to have L flank shingles pain and lesions.  Pt is home with family support at all times per her report.  PMHx: RA, HLD, DM, HTN, RW use,  Clinical Impression  Pt was seen for mobility within the confines of her room due to airborne precautions for shingles outbreak on L flank.  Pt is in some pain but managed with meds by nursing.  Pt is a bit distracted and forgetful, but reports she has regular family help to go home.  If she is not going to have this, may need to reconsider HHPT and home dc.   Pt is walking cued in confined space, better with RW due to her balance changes.  This is her PLOF as well. Follow her for goals of acute PT and encourage her to be up to sit and to build endurance in case her plan to go home is with less than 24/7 help, and to avoid a SNF detour.     Recommendations for follow up therapy are one component of a multi-disciplinary discharge planning process, led by the attending physician.  Recommendations may be updated based on patient status, additional functional criteria and insurance authorization.  Follow Up Recommendations Home health PT    Assistance Recommended at Discharge Frequent or constant Supervision/Assistance  Functional Status Assessment Patient has had a recent decline in their functional status and demonstrates the ability to make significant improvements in function in a reasonable and predictable amount of time.  Equipment Recommendations  None recommended by PT    Recommendations for Other Services       Precautions / Restrictions Precautions Precautions: Fall Precaution Comments: mild cognitive  changes Restrictions Weight Bearing Restrictions: No      Mobility  Bed Mobility Overal bed mobility: Needs Assistance Bed Mobility: Supine to Sit;Sit to Supine     Supine to sit: Min guard Sit to supine: Min guard        Transfers Overall transfer level: Needs assistance Equipment used: Rolling walker (2 wheels);1 person hand held assist Transfers: Sit to/from Stand Sit to Stand: Min guard           General transfer comment: min guard for safety    Ambulation/Gait Ambulation/Gait assistance: Min guard Gait Distance (Feet): 45 Feet Assistive device: Rolling walker (2 wheels);1 person hand held assist Gait Pattern/deviations: Step-through pattern;Decreased stride length;Trunk flexed Gait velocity: reduced Gait velocity interpretation: <1.31 ft/sec, indicative of household ambulator General Gait Details: walks slowly with or without walker, able to back up with supervision, maneuvering in confined spaces  Stairs            Wheelchair Mobility    Modified Rankin (Stroke Patients Only)       Balance Overall balance assessment: Needs assistance Sitting-balance support: Feet supported Sitting balance-Leahy Scale: Good   Postural control: Posterior lean Standing balance support: No upper extremity supported;Bilateral upper extremity supported Standing balance-Leahy Scale: Fair Standing balance comment: fair standing but with walker more upright and careful                             Pertinent Vitals/Pain Pain Assessment: No/denies pain  Home Living Family/patient expects to be discharged to:: Private residence Living Arrangements: Children Available Help at Discharge: Family;Available 24 hours/day Type of Home: House Home Access: Level entry       Home Layout: One level Home Equipment: Conservation officer, nature (2 wheels);Rollator (4 wheels);Cane - single point;Wheelchair - manual Additional Comments: will potentially need 3 in one     Prior Function Prior Level of Function : Needs assist             Mobility Comments: RW with mod I but family helping with meals and house ADLs Comments: pt was independent with dressing per her report     Hand Dominance   Dominant Hand: Right    Extremity/Trunk Assessment   Upper Extremity Assessment Upper Extremity Assessment: Overall WFL for tasks assessed    Lower Extremity Assessment Lower Extremity Assessment: Generalized weakness (mild)    Cervical / Trunk Assessment Cervical / Trunk Assessment: Kyphotic  Communication   Communication: No difficulties  Cognition Arousal/Alertness: Awake/alert Behavior During Therapy: WFL for tasks assessed/performed Overall Cognitive Status: No family/caregiver present to determine baseline cognitive functioning                                 General Comments: pt may be giving incorrect history        General Comments General comments (skin integrity, edema, etc.): Pt was assisted to walk with no AD in room, tends to reach for the furniture then with RW more stable.  Encouraged her to maneuver safely in the confined space    Exercises     Assessment/Plan    PT Assessment Patient needs continued PT services  PT Problem List Decreased strength;Decreased activity tolerance;Decreased balance;Decreased mobility;Decreased safety awareness       PT Treatment Interventions DME instruction;Gait training;Functional mobility training;Therapeutic activities;Therapeutic exercise;Balance training;Neuromuscular re-education;Patient/family education    PT Goals (Current goals can be found in the Care Plan section)  Acute Rehab PT Goals Patient Stated Goal: to get home with family PT Goal Formulation: With patient Time For Goal Achievement: 08/16/21 Potential to Achieve Goals: Good    Frequency Min 3X/week   Barriers to discharge   has family help which will be essential for household management     Co-evaluation               AM-PAC PT "6 Clicks" Mobility  Outcome Measure Help needed turning from your back to your side while in a flat bed without using bedrails?: A Little Help needed moving from lying on your back to sitting on the side of a flat bed without using bedrails?: A Little Help needed moving to and from a bed to a chair (including a wheelchair)?: A Little Help needed standing up from a chair using your arms (e.g., wheelchair or bedside chair)?: A Little Help needed to walk in hospital room?: A Little Help needed climbing 3-5 steps with a railing? : A Lot 6 Click Score: 17    End of Session Equipment Utilized During Treatment: Gait belt Activity Tolerance: Patient tolerated treatment well Patient left: in bed;with call bell/phone within reach;with bed alarm set Nurse Communication: Mobility status PT Visit Diagnosis: Unsteadiness on feet (R26.81);Muscle weakness (generalized) (M62.81);Difficulty in walking, not elsewhere classified (R26.2)    Time: 1751-0258 PT Time Calculation (min) (ACUTE ONLY): 31 min   Charges:   PT Evaluation $PT Eval Moderate Complexity: 1 Mod PT Treatments $Gait Training: 8-22 mins  Ramond Dial 08/09/2021, 4:53 PM  Mee Hives, PT PhD Acute Rehab Dept. Number: [place]

## 2021-08-09 NOTE — Progress Notes (Signed)
New Admission Note:    Arrival Method: Stretcher from ED Mental Orientation:   A&O X4 Telemetry: NSR Assessment: See flowsheet IV:   Right arm Pain: Shingles pain of 8/10 morphine given Tubes: none Safety Measures: Admission: Completed [facility] [place] Orientation: Patient has been orientated to the room, unit and staff.  Family: not at bedside     Orders have been reviewed and implemented. Will continue to monitor the patient. Call light has been placed within reach and bed alarm has been activated.

## 2021-08-09 NOTE — Progress Notes (Signed)
PROGRESS NOTE        PATIENT DETAILS Name: Briana [name] Age: 87 y.o. Sex: female Date of Birth: 09-07-1931 Admit Date: 08/07/2021 Admitting Physician Rise Patience, MD IDP:[id number], Arvid Right, MD  Brief Narrative: Patient is a 87 y.o. female with history of RA, HTN, HLD, DM-2-who was diagnosed with shingles approximately 7 days back and started on Valtrex/Neurontin-presented with generalized weakness/tremors/unsteady gait-was found to have AKI-see below for further details.  Subjective: Feels better-no chest pain or shortness of breath-inquiring about discharge.  Some neuropathic pain in her left flank continues.  Objective: Vitals: Blood pressure (!) 161/56, pulse 60, temperature (!) 97.3 F (36.3 C), temperature source Oral, resp. rate 16, height 5\' 4"  (1.626 m), weight 74.4 kg, SpO2 100 %.   Exam: Gen Exam:Alert awake-not in any distress HEENT:atraumatic, normocephalic Chest: B/L clear to auscultation anteriorly CVS:S1S2 regular Abdomen:soft non tender, non distended Extremities:no edema Neurology: Non focal Skin: Small area of shingles is already crusted.  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  UA bland-renal ultrasound without any hydronephrosis.  Repeat electrolytes tomorrow-avoid nephrotoxic agents.    Unsteady gait/tremors: Probably due to Neurontin toxicity in the setting of worsening renal failure.  Symptoms seem to have markedly improved after holding Neurontin and improvement in renal function.  Blade with nursing staff/PT and see how she does.  Recent history of shingles in the left flank area: Continue supportive care-suspect we  could restart some low-dose Neurontin.  HTN: BP stable-continue beta-blocker and amlodipine.  DM-2: CBG stable-continue SSI  Recent Labs    08/08/21 1656 08/08/21 2217 08/09/21 0659  GLUCAP 152* 138* 103*      HLD: Continue statin  Asymptomatic bacteriuria: History not suggestive of UTI-no longer on Rocephin  Rheumatoid arthritis: Continue Plaquenil hold methotrexate.  Obesity: Estimated body mass index is 28.15 kg/m as calculated from the following:   Height as of this encounter: 5\' 4"  (1.626 m).   Weight as of this encounter: 74.4 kg.    Procedures: None Consults: None DVT Prophylaxis: SQ Heparin Code Status:Full code  Family Communication: None at bedside  Time spent: 35 minutes-Greater than 50% of this time was spent in counseling, explanation of diagnosis, planning of further management, and coordination of care.  Diet: Diet Order             Diet heart healthy/carb modified Room service appropriate? Yes; Fluid consistency: Thin  Diet effective now                      Disposition Plan: Status is: Inpatient  Remains inpatient appropriate because: AKI/unsteady gait-not yet stable for discharge.    Antimicrobial agents: Anti-infectives (From admission, onward)    Start     Dose/Rate Route Frequency Ordered Stop   08/09/21 1000  hydroxychloroquine (PLAQUENIL) tablet 200 mg        200 mg Oral 2 times daily 08/08/21 1209     08/08/21 0645  cefTRIAXone (ROCEPHIN) 1 g in sodium chloride 0.9 % 100 mL IVPB  Status:  Discontinued        1 g 200 mL/hr over 30 Minutes Intravenous Daily 08/08/21 0642 08/08/21 1207  MEDICATIONS: Scheduled Meds:  amLODipine  10 mg Oral Daily   atenolol  100 mg Oral Daily   atorvastatin  10 mg Oral QHS   dorzolamide-timolol  1 drop Both Eyes BID   folic acid  1 mg Oral Daily   heparin  5,000 Units Subcutaneous Q8H   hydroxychloroquine  200 mg Oral BID   insulin aspart  0-9 Units Subcutaneous TID WC    latanoprost  1 drop Both Eyes QHS   lidocaine  1 patch Transdermal Q24H   Netarsudil Dimesylate  1 drop Right Eye QHS   Continuous Infusions:   PRN Meds:.acetaminophen **OR** acetaminophen, morphine injection   I have personally reviewed following labs and imaging studies  LABORATORY DATA: CBC: Recent Labs  Lab 08/07/21 1658 08/08/21 0800  WBC 11.9* 11.7*  NEUTROABS 8.8* 8.8*  HGB 13.3 13.9  HCT 39.0 39.6  MCV 93.5 91.2  PLT 168 161     Basic Metabolic Panel: Recent Labs  Lab 08/07/21 1658 08/08/21 0701 08/09/21 0257  NA 138 142 141  K 5.2* 3.4* 3.9  CL 106 108 111  CO2 23 22 22   GLUCOSE 119* 144* 110*  BUN 45* 37* 32*  CREATININE 3.48* 2.72* 2.18*  CALCIUM 9.3 9.8 8.8*     GFR: Estimated Creatinine Clearance: 18 mL/min (A) (by C-G formula based on SCr of 2.18 mg/dL (H)).  Liver Function Tests: Recent Labs  Lab 08/07/21 1658 08/08/21 0701  AST 47* 40  ALT 30 39  ALKPHOS 89 86  BILITOT 1.1 0.7  PROT 7.1 7.4  ALBUMIN 3.7 3.7    No results for input(s): LIPASE, AMYLASE in the last 168 hours. No results for input(s): AMMONIA in the last 168 hours.  Coagulation Profile: No results for input(s): INR, PROTIME in the last 168 hours.  Cardiac Enzymes: Recent Labs  Lab 08/08/21 0800  CKTOTAL 125     BNP (last 3 results) No results for input(s): PROBNP in the last 8760 hours.  Lipid Profile: No results for input(s): CHOL, HDL, LDLCALC, TRIG, CHOLHDL, LDLDIRECT in the last 72 hours.  Thyroid Function Tests: No results for input(s): TSH, T4TOTAL, FREET4, T3FREE, THYROIDAB in the last 72 hours.  Anemia Panel: No results for input(s): VITAMINB12, FOLATE, FERRITIN, TIBC, IRON, RETICCTPCT in the last 72 hours.  Urine analysis:    Component Value Date/Time   COLORURINE YELLOW 08/08/2021 0434   APPEARANCEUR HAZY (A) 08/08/2021 0434   LABSPEC 1.008 08/08/2021 0434   PHURINE 5.0 08/08/2021 0434   GLUCOSEU NEGATIVE 08/08/2021 0434   GLUCOSEU  NEGATIVE 12/30/2020 1031   HGBUR NEGATIVE 08/08/2021 [place] 08/08/2021 0434   KETONESUR NEGATIVE 08/08/2021 0434   PROTEINUR NEGATIVE 08/08/2021 0434   UROBILINOGEN 0.2 12/30/2020 1031   NITRITE NEGATIVE 08/08/2021 0434   LEUKOCYTESUR MODERATE (A) 08/08/2021 0434    Sepsis Labs: Lactic Acid, Venous    Component Value Date/Time   LATICACIDVEN 2.0 ([place]) 08/08/2021 0339    MICROBIOLOGY: Recent Results (from the past 240 hour(s))  Resp Panel by RT-PCR (Flu A&B, Covid) Nasopharyngeal Swab     Status: None   Collection Time: 08/08/21  3:40 AM   Specimen: Nasopharyngeal Swab; Nasopharyngeal(NP) swabs in vial transport medium  Result Value Ref Range Status   SARS Coronavirus 2 by RT PCR NEGATIVE NEGATIVE Final    Comment: (NOTE) SARS-CoV-2 target nucleic acids are NOT DETECTED.  The SARS-CoV-2 RNA is generally detectable in upper respiratory specimens during the acute phase of infection. The lowest concentration of  SARS-CoV-2 viral copies this assay can detect is 138 copies/mL. A negative result does not preclude SARS-Cov-2 infection and should not be used as the sole basis for treatment or other patient management decisions. A negative result may occur with  improper specimen collection/handling, submission of specimen other than nasopharyngeal swab, presence of viral mutation(s) within the areas targeted by this assay, and inadequate number of viral copies(<138 copies/mL). A negative result must be combined with clinical observations, patient history, and epidemiological information. The expected result is Negative.  Fact Sheet for Patients:  EntrepreneurPulse.com.au  Fact Sheet for Healthcare Providers:  IncredibleEmployment.be  This test is no t yet approved or cleared by the [place] FDA and  has been authorized for detection and/or diagnosis of SARS-CoV-2 by FDA under an Emergency Use Authorization (EUA). This  EUA will remain  in effect (meaning this test can be used) for the duration of the COVID-19 declaration under Section 564(b)(1) of the Act, 21 U.S.C.section 360bbb-3(b)(1), unless the authorization is terminated  or revoked sooner.       Influenza A by PCR NEGATIVE NEGATIVE Final   Influenza B by PCR NEGATIVE NEGATIVE Final    Comment: (NOTE) The Xpert Xpress SARS-CoV-2/FLU/RSV plus assay is intended as an aid in the diagnosis of influenza from Nasopharyngeal swab specimens and should not be used as a sole basis for treatment. Nasal washings and aspirates are unacceptable for Xpert Xpress SARS-CoV-2/FLU/RSV testing.  Fact Sheet for Patients: EntrepreneurPulse.com.au  Fact Sheet for Healthcare Providers: IncredibleEmployment.be  This test is not yet approved or cleared by the [place] FDA and has been authorized for detection and/or diagnosis of SARS-CoV-2 by FDA under an Emergency Use Authorization (EUA). This EUA will remain in effect (meaning this test can be used) for the duration of the COVID-19 declaration under Section 564(b)(1) of the Act, 21 U.S.C. section 360bbb-3(b)(1), unless the authorization is terminated or revoked.  Performed at [facility], [place] [address] [place], [place] [address]   Culture, blood (routine x 2)     Status: None (Preliminary result)   Collection Time: 08/08/21  8:04 AM   Specimen: BLOOD RIGHT WRIST  Result Value Ref Range Status   Specimen Description BLOOD RIGHT WRIST  Final   Special Requests   Final    BOTTLES DRAWN AEROBIC AND ANAEROBIC Blood Culture adequate volume   Culture   Final    NO GROWTH 1 DAY Performed at [facility], [place] [address] [place], [place] [address]    Report Status PENDING  Incomplete  Culture, blood (routine x 2)     Status: None (Preliminary result)   Collection Time: 08/08/21  8:24 AM   Specimen: BLOOD  Result Value Ref Range Status   Specimen  Description BLOOD SITE NOT SPECIFIED  Final   Special Requests   Final    BOTTLES DRAWN AEROBIC AND ANAEROBIC Blood Culture results may not be optimal due to an excessive volume of blood received in culture bottles   Culture   Final    NO GROWTH 1 DAY Performed at [facility], [place] [address] [place], [place] [address]    Report Status PENDING  Incomplete    RADIOLOGY STUDIES/RESULTS: DG Chest 1 View  Result Date: 08/07/2021 CLINICAL DATA:  Weakness. EXAM: CHEST  1 VIEW COMPARISON:  Chest x-ray 02/27/2017. FINDINGS: The heart size and mediastinal contours are within normal limits. Both lungs are clear. The visualized skeletal structures are unremarkable. IMPRESSION: No active disease. Electronically  Signed   By: Ronney Asters M.D.   On: 08/07/2021 17:19   CT Head Wo Contrast  Result Date: 08/07/2021 CLINICAL DATA:  Neuro deficit, acute, stroke suspected EXAM: CT HEAD WITHOUT CONTRAST TECHNIQUE: Contiguous axial images were obtained from the base of the skull through the vertex without intravenous contrast. COMPARISON:  04/28/2019 FINDINGS: Brain: There is atrophy and chronic small vessel disease changes. No acute intracranial abnormality. Specifically, no hemorrhage, hydrocephalus, mass lesion, acute infarction, or significant intracranial injury. Small bilateral chronic subdural hygromas are again noted, unchanged. Vascular: No hyperdense vessel or unexpected calcification. Skull: No acute calvarial abnormality. Sinuses/Orbits: No acute findings Other: None IMPRESSION: Atrophy, chronic microvascular disease. No acute intracranial abnormality. Stable small bilateral chronic subdural hygromas. Electronically Signed   By: Rolm Baptise M.D.   On: 08/07/2021 18:49   US RENAL  Result Date: 08/08/2021 CLINICAL DATA:  Acute renal failure EXAM: RENAL / URINARY TRACT ULTRASOUND COMPLETE COMPARISON:  None. FINDINGS: Right Kidney: Renal measurements: 11.2 cm x 3.7 cm x 4.8 cm = volume: 104 mL.  Echogenicity within normal limits. No mass or hydronephrosis visualized. Left Kidney: Renal measurements: 10.0 cm x 5.8 cm x 3.7 cm = volume: 113 mL. Echogenicity within normal limits. No mass or hydronephrosis visualized. Bladder: Appears normal for degree of bladder distention. Other: None. IMPRESSION: Normal renal ultrasound. Electronically Signed   By: Valetta Mole M.D.   On: 08/08/2021 07:51     LOS: 1 day   Oren Binet, MD  [facility]    To contact the attending provider between 7A-7P or the covering provider during after hours 7P-7A, please log into the web site [email or web address] and access using universal [place] password for that web site. If you do not have the password, please call the hospital operator.  08/09/2021, 11:39 AM

## 2021-08-10 DIAGNOSIS — N179 Acute kidney failure, unspecified: Secondary | ICD-10-CM | POA: Diagnosis not present

## 2021-08-10 DIAGNOSIS — M05711 Rheumatoid arthritis with rheumatoid factor of right shoulder without organ or systems involvement: Secondary | ICD-10-CM | POA: Diagnosis not present

## 2021-08-10 DIAGNOSIS — M05712 Rheumatoid arthritis with rheumatoid factor of left shoulder without organ or systems involvement: Secondary | ICD-10-CM | POA: Diagnosis not present

## 2021-08-10 DIAGNOSIS — I10 Essential (primary) hypertension: Secondary | ICD-10-CM | POA: Diagnosis not present

## 2021-08-10 LAB — BASIC METABOLIC PANEL
Anion gap: 9 (ref 5–15)
BUN: 28 mg/dL — ABNORMAL HIGH (ref 8–23)
CO2: 24 mmol/L (ref 22–32)
Calcium: 9.1 mg/dL (ref 8.9–10.3)
Chloride: 108 mmol/L (ref 98–111)
Creatinine, Ser: 1.57 mg/dL — ABNORMAL HIGH (ref 0.44–1.00)
GFR, Estimated: 32 mL/min — ABNORMAL LOW (ref >60)
Glucose, Bld: 95 mg/dL (ref 70–99)
Potassium: 3.5 mmol/L (ref 3.5–5.1)
Sodium: 141 mmol/L (ref 135–145)

## 2021-08-10 LAB — GLUCOSE, CAPILLARY: Glucose-Capillary: 92 mg/dL (ref 70–99)

## 2021-08-10 MED ORDER — SIMETHICONE 80 MG PO CHEW
80.0000 mg | CHEWABLE_TABLET | Freq: Four times a day (QID) | ORAL | Status: DC | PRN
  Administered 2021-08-10: 80 mg via ORAL
  Filled 2021-08-10: qty 1

## 2021-08-10 MED ORDER — LIDOCAINE 5 % EX PTCH: 1.0000 | MEDICATED_PATCH | CUTANEOUS | 0 refills | Status: DC

## 2021-08-10 NOTE — Progress Notes (Signed)
Nsg Discharge Note  Admit Date:  08/07/2021 Discharge date: 08/10/2021   Briana [name] to be D/C'd Home per MD order.  AVS completed.  Patient/caregiver able to verbalize understanding.  Discharge Medication: Allergies as of 08/10/2021       Reactions   Ibuprofen Anaphylaxis   Percocet [oxycodone-acetaminophen]    nausea   Simvastatin Other (See Comments)   muscle aches   Diltiazem Hcl    REACTION: ANGIO EDEMA   Hydrochlorothiazide    REACTION: SWELLING   Lisinopril    REACTION: SWELLING   Verapamil    REACTION: SWELLING        Medication List     STOP taking these medications    gabapentin 300 MG capsule Commonly known as: NEURONTIN   methotrexate 2.5 MG tablet Commonly known as: RHEUMATREX   valACYclovir 1000 MG tablet Commonly known as: VALTREX       TAKE these medications    acetaminophen 650 MG CR tablet Commonly known as: TYLENOL Take 650 mg by mouth every 8 (eight) hours as needed for pain.   amLODipine 10 MG tablet Commonly known as: NORVASC TAKE 1 TABLET EVERY DAY   atenolol 100 MG tablet Commonly known as: TENORMIN TAKE 1 TABLET EVERY DAY   atorvastatin 10 MG tablet Commonly known as: LIPITOR TAKE 1 TABLET EVERY DAY What changed: when to take this   b complex vitamins tablet Take 1 tablet by mouth daily.   dorzolamide-timolol 22.3-6.8 MG/ML ophthalmic solution Commonly known as: COSOPT Place 1 drop into both eyes 2 (two) times daily.   fexofenadine 180 MG tablet Commonly known as: ALLEGRA Take 180 mg by mouth daily as needed for allergies.   folic acid 1 MG tablet Commonly known as: FOLVITE Take 1 mg by mouth daily.   hydroxychloroquine 200 MG tablet Commonly known as: PLAQUENIL Take 200 mg by mouth 2 (two) times daily.   latanoprost 0.005 % ophthalmic solution Commonly known as: XALATAN Place 1 drop into both eyes at bedtime.   lidocaine 5 % Commonly known as: Lidoderm Place 1 patch onto the skin daily. Remove &  Discard patch within 12 hours or as directed by MD   meclizine 25 MG tablet Commonly known as: Medi-Meclizine Take 1 tablet (25 mg total) by mouth 3 (three) times daily as needed for dizziness.   Rhopressa 0.02 % Soln Generic drug: Netarsudil Dimesylate Place 1 drop into the right eye at bedtime.   traMADol 50 MG tablet Commonly known as: ULTRAM TAKE 1 TABLET(50 MG) BY MOUTH EVERY 12 HOURS AS NEEDED FOR MODERATE PAIN OR SEVERE PAIN What changed:  how much to take how to take this when to take this reasons to take this additional instructions   Vitamin D 50 MCG (2000 UT) tablet Take 2,000 Units by mouth daily.        Discharge Assessment: Vitals:   08/10/21 0927 08/10/21 1012  BP: (!) 140/51 (!) 179/60  Pulse: 60 (!) 59  Resp: 18   Temp: 97.7 F (36.5 C) 98.7 F (37.1 C)  SpO2: 98% 97%   Skin clean, dry and intact without evidence of skin break down, no evidence of skin tears noted. IV catheter discontinued intact. Site without signs and symptoms of complications - no redness or edema noted at insertion site, patient denies c/o pain - only slight tenderness at site.  Dressing with slight pressure applied.  D/c Instructions-Education: Discharge instructions given to patient/family with verbalized understanding. D/c education completed with patient/family including follow up instructions,  medication list, d/c activities limitations if indicated, with other d/c instructions as indicated by MD - patient able to verbalize understanding, all questions fully answered. Patient instructed to return to ED, call 911, or call MD for any changes in condition.  Patient escorted via [place], and D/C home via private auto.  Onesti Bonfiglio, Jolene Schimke, RN 08/10/2021 1:55 PM

## 2021-08-10 NOTE — Plan of Care (Signed)
Patient discharged home with [facility]

## 2021-08-10 NOTE — Discharge Summary (Signed)
PATIENT DETAILS Name: Briana [name] Age: 87 y.o. Sex: female Date of Birth: 1930/12/03 MRN: [id number]. Admitting Physician: Rise Patience, MD IRW:[id number], Arvid Right, MD  Admit Date: 08/07/2021 Discharge date: 08/10/2021  Recommendations for Outpatient Follow-up:  Follow up with PCP in 1-2 weeks Please obtain CMP/CBC in one week Resume methotrexate when renal function has completely normalized.  Admitted From:  Home  Disposition: Home with home health services   [place]: Yes  Equipment/Devices: None  Discharge Condition: Stable  CODE STATUS: FULL CODE  Diet recommendation:  Diet Order             Diet - low sodium heart healthy           Diet heart healthy/carb modified Room service appropriate? Yes; Fluid consistency: Thin  Diet effective now                    Brief Summary: 87 year old female with history of RA, HTN, HLD, DM-2-who was recently diagnosed with shingles involving her left flank area-started on Valtrex/Neurontin-presented with generalized weakness/unsteady gait/tremors-she was found to have AKI and subsequently admitted to the hospitalist service.  See below for further details.  Brief Hospital Course: AKI: Likely due to Valtrex-this was held on admission-patient was gently hydrated-creatinine improved with supportive care.  UA was bland-renal ultrasound without any hydronephrosis.  Unsteady gait/tremors: Likely due to Neurontin toxicity in the setting of AKI-has resolved.  Evaluated by PT with recommendations for home health services.  Recent history of shingles in the left flank area: Continues to have mild neuropathic pain-well controlled with transdermal Lidoderm patches.  Affected area has already crusted.    HLD: Continue statin  Asymptomatic bacteriuria: No symptoms of UTI-does not need antibiotics.  Rheumatoid arthritis: Continue Plaquenil on discharge-continue to hold methotrexate until renal function has completely  normalized.   Procedures None  Discharge Diagnoses:  Principal Problem:   ARF (acute renal failure) ([place]) Active Problems:   Essential hypertension, benign   RA (rheumatoid arthritis) ([place])   Type II diabetes mellitus with manifestations ([place])   Shingles   Discharge Instructions:  Activity:  As tolerated   Discharge Instructions     Diet - low sodium heart healthy   Complete by: As directed    Discharge instructions   Complete by: As directed    Follow with Primary MD  Janith Lima, MD in 1-2 weeks  Please get a complete blood count and chemistry panel checked by your Primary MD at your next visit, and again as instructed by your Primary MD.  Get Medicines reviewed and adjusted: Please take all your medications with you for your next visit with your Primary MD  Laboratory/radiological data: Please request your Primary MD to go over all hospital tests and procedure/radiological results at the follow up, please ask your Primary MD to get all Hospital records sent to his/her office.  In some cases, they will be blood work, cultures and biopsy results pending at the time of your discharge. Please request that your primary care M.D. follows up on these results.  Also Note the following: If you experience worsening of your admission symptoms, develop shortness of breath, life threatening emergency, suicidal or homicidal thoughts you must seek medical attention immediately by calling 911 or calling your MD immediately  if symptoms less severe.  You must read complete instructions/literature along with all the possible adverse reactions/side effects for all the Medicines you take and that have been prescribed to you. Take any  new Medicines after you have completely understood and accpet all the possible adverse reactions/side effects.   Do not drive when taking Pain medications or sleeping medications (Benzodaizepines)  Do not take more than prescribed Pain, Sleep and Anxiety  Medications. It is not advisable to combine anxiety,sleep and pain medications without talking with your primary care practitioner  Special Instructions: If you have smoked or chewed Tobacco  in the last 2 yrs please stop smoking, stop any regular Alcohol  and or any Recreational drug use.  Wear Seat belts while driving.  Please note: You were cared for by a hospitalist during your hospital stay. Once you are discharged, your primary care physician will handle any further medical issues. Please note that NO REFILLS for any discharge medications will be authorized once you are discharged, as it is imperative that you return to your primary care physician (or establish a relationship with a primary care physician if you do not have one) for your post hospital discharge needs so that they can reassess your need for medications and monitor your lab values.   1.  Please ask your primary care practitioner to repeat electrolytes in 1 week  2.  Methotrexate (rheumatoid arthritis medication) will be continued to held on discharge until your renal function has completely normalized.   Increase activity slowly   Complete by: As directed       Allergies as of 08/10/2021       Reactions   Ibuprofen Anaphylaxis   Percocet [oxycodone-acetaminophen]    nausea   Simvastatin Other (See Comments)   muscle aches   Diltiazem Hcl    REACTION: ANGIO EDEMA   Hydrochlorothiazide    REACTION: SWELLING   Lisinopril    REACTION: SWELLING   Verapamil    REACTION: SWELLING        Medication List     STOP taking these medications    gabapentin 300 MG capsule Commonly known as: NEURONTIN   methotrexate 2.5 MG tablet Commonly known as: RHEUMATREX   valACYclovir 1000 MG tablet Commonly known as: VALTREX       TAKE these medications    acetaminophen 650 MG CR tablet Commonly known as: TYLENOL Take 650 mg by mouth every 8 (eight) hours as needed for pain.   amLODipine 10 MG tablet Commonly  known as: NORVASC TAKE 1 TABLET EVERY DAY   atenolol 100 MG tablet Commonly known as: TENORMIN TAKE 1 TABLET EVERY DAY   atorvastatin 10 MG tablet Commonly known as: LIPITOR TAKE 1 TABLET EVERY DAY What changed: when to take this   b complex vitamins tablet Take 1 tablet by mouth daily.   dorzolamide-timolol 22.3-6.8 MG/ML ophthalmic solution Commonly known as: COSOPT Place 1 drop into both eyes 2 (two) times daily.   fexofenadine 180 MG tablet Commonly known as: ALLEGRA Take 180 mg by mouth daily as needed for allergies.   folic acid 1 MG tablet Commonly known as: FOLVITE Take 1 mg by mouth daily.   hydroxychloroquine 200 MG tablet Commonly known as: PLAQUENIL Take 200 mg by mouth 2 (two) times daily.   latanoprost 0.005 % ophthalmic solution Commonly known as: XALATAN Place 1 drop into both eyes at bedtime.   lidocaine 5 % Commonly known as: Lidoderm Place 1 patch onto the skin daily. Remove & Discard patch within 12 hours or as directed by MD   meclizine 25 MG tablet Commonly known as: Medi-Meclizine Take 1 tablet (25 mg total) by mouth 3 (three) times daily as needed  for dizziness.   Rhopressa 0.02 % Soln Generic drug: Netarsudil Dimesylate Place 1 drop into the right eye at bedtime.   traMADol 50 MG tablet Commonly known as: ULTRAM TAKE 1 TABLET(50 MG) BY MOUTH EVERY 12 HOURS AS NEEDED FOR MODERATE PAIN OR SEVERE PAIN What changed:  how much to take how to take this when to take this reasons to take this additional instructions   Vitamin D 50 MCG (2000 UT) tablet Take 2,000 Units by mouth daily.        Allergies  Allergen Reactions   Ibuprofen Anaphylaxis   Percocet [Oxycodone-Acetaminophen]     nausea   Simvastatin Other (See Comments)    muscle aches   Diltiazem Hcl     REACTION: ANGIO EDEMA   Hydrochlorothiazide     REACTION: SWELLING   Lisinopril     REACTION: SWELLING   Verapamil     REACTION: SWELLING      Consultations:   None    Other Procedures/Studies: DG Chest 1 View  Result Date: 08/07/2021 CLINICAL DATA:  Weakness. EXAM: CHEST  1 VIEW COMPARISON:  Chest x-ray 02/27/2017. FINDINGS: The heart size and mediastinal contours are within normal limits. Both lungs are clear. The visualized skeletal structures are unremarkable. IMPRESSION: No active disease. Electronically Signed   By: Ronney Asters M.D.   On: 08/07/2021 17:19   CT Head Wo Contrast  Result Date: 08/07/2021 CLINICAL DATA:  Neuro deficit, acute, stroke suspected EXAM: CT HEAD WITHOUT CONTRAST TECHNIQUE: Contiguous axial images were obtained from the base of the skull through the vertex without intravenous contrast. COMPARISON:  04/28/2019 FINDINGS: Brain: There is atrophy and chronic small vessel disease changes. No acute intracranial abnormality. Specifically, no hemorrhage, hydrocephalus, mass lesion, acute infarction, or significant intracranial injury. Small bilateral chronic subdural hygromas are again noted, unchanged. Vascular: No hyperdense vessel or unexpected calcification. Skull: No acute calvarial abnormality. Sinuses/Orbits: No acute findings Other: None IMPRESSION: Atrophy, chronic microvascular disease. No acute intracranial abnormality. Stable small bilateral chronic subdural hygromas. Electronically Signed   By: Rolm Baptise M.D.   On: 08/07/2021 18:49   US RENAL  Result Date: 08/08/2021 CLINICAL DATA:  Acute renal failure EXAM: RENAL / URINARY TRACT ULTRASOUND COMPLETE COMPARISON:  None. FINDINGS: Right Kidney: Renal measurements: 11.2 cm x 3.7 cm x 4.8 cm = volume: 104 mL. Echogenicity within normal limits. No mass or hydronephrosis visualized. Left Kidney: Renal measurements: 10.0 cm x 5.8 cm x 3.7 cm = volume: 113 mL. Echogenicity within normal limits. No mass or hydronephrosis visualized. Bladder: Appears normal for degree of bladder distention. Other: None. IMPRESSION: Normal renal ultrasound. Electronically Signed   By: Valetta Mole M.D.   On: 08/08/2021 07:51     TODAY-DAY OF DISCHARGE:  Subjective:   Willodene Pyka today has no headache,no chest abdominal pain,no new weakness tingling or numbness, feels much better wants to go home today.   Objective:   Blood pressure (!) 179/60, pulse (!) 59, temperature 98.7 F (37.1 C), temperature source Oral, resp. rate 18, height 5\' 4"  (1.626 m), weight 74.4 kg, SpO2 97 %.  Intake/Output Summary (Last 24 hours) at 08/10/2021 1156 Last data filed at 08/10/2021 0600 Gross per 24 hour  Intake 930 ml  Output 1500 ml  Net -570 ml   Filed Weights   08/08/21 1157 08/08/21 2249  Weight: 76.2 kg 74.4 kg    Exam: Awake Alert, Oriented *3, No new F.N deficits, Normal affect [place].AT,PERRAL Supple Neck,No JVD, No cervical lymphadenopathy appriciated.  Symmetrical Chest wall movement, Good air movement bilaterally, CTAB RRR,No Gallops,Rubs or new Murmurs, No Parasternal Heave +ve B.Sounds, Abd Soft, Non tender, No organomegaly appriciated, No rebound -guarding or rigidity. No Cyanosis, Clubbing or edema, No new Rash or bruise   PERTINENT RADIOLOGIC STUDIES: No results found.   PERTINENT LAB RESULTS: CBC: Recent Labs    08/07/21 1658 08/08/21 0800  WBC 11.9* 11.7*  HGB 13.3 13.9  HCT 39.0 39.6  PLT 168 161   CMET CMP     Component Value Date/Time   NA 141 08/10/2021 1009   NA 144 04/22/2017 0000   K 3.5 08/10/2021 1009   CL 108 08/10/2021 1009   CO2 24 08/10/2021 1009   GLUCOSE 95 08/10/2021 1009   BUN 28 (H) 08/10/2021 1009   BUN 20 04/22/2017 0000   CREATININE 1.57 (H) 08/10/2021 1009   CREATININE 0.95 (H) 05/29/2020 1043   CALCIUM 9.1 08/10/2021 1009   PROT 7.4 08/08/2021 0701   ALBUMIN 3.7 08/08/2021 0701   AST 40 08/08/2021 0701   ALT 39 08/08/2021 0701   ALKPHOS 86 08/08/2021 0701   BILITOT 0.7 08/08/2021 0701   GFRNONAA 32 (L) 08/10/2021 1009   GFRNONAA 54 (L) 05/29/2020 1043   GFRAA 63 05/29/2020 1043    GFR Estimated Creatinine  Clearance: 24.9 mL/min (A) (by C-G formula based on SCr of 1.57 mg/dL (H)). No results for input(s): LIPASE, AMYLASE in the last 72 hours. Recent Labs    08/08/21 0800  CKTOTAL 125   Invalid input(s): POCBNP No results for input(s): DDIMER in the last 72 hours. Recent Labs    08/08/21 0800  HGBA1C 6.2*   No results for input(s): CHOL, HDL, LDLCALC, TRIG, CHOLHDL, LDLDIRECT in the last 72 hours. No results for input(s): TSH, T4TOTAL, T3FREE, THYROIDAB in the last 72 hours.  Invalid input(s): FREET3 No results for input(s): VITAMINB12, FOLATE, FERRITIN, TIBC, IRON, RETICCTPCT in the last 72 hours. Coags: No results for input(s): INR in the last 72 hours.  Invalid input(s): PT Microbiology: Recent Results (from the past 240 hour(s))  Resp Panel by RT-PCR (Flu A&B, Covid) Nasopharyngeal Swab     Status: None   Collection Time: 08/08/21  3:40 AM   Specimen: Nasopharyngeal Swab; Nasopharyngeal(NP) swabs in vial transport medium  Result Value Ref Range Status   SARS Coronavirus 2 by RT PCR NEGATIVE NEGATIVE Final    Comment: (NOTE) SARS-CoV-2 target nucleic acids are NOT DETECTED.  The SARS-CoV-2 RNA is generally detectable in upper respiratory specimens during the acute phase of infection. The lowest concentration of SARS-CoV-2 viral copies this assay can detect is 138 copies/mL. A negative result does not preclude SARS-Cov-2 infection and should not be used as the sole basis for treatment or other patient management decisions. A negative result may occur with  improper specimen collection/handling, submission of specimen other than nasopharyngeal swab, presence of viral mutation(s) within the areas targeted by this assay, and inadequate number of viral copies(<138 copies/mL). A negative result must be combined with clinical observations, patient history, and epidemiological information. The expected result is Negative.  Fact Sheet for Patients:   EntrepreneurPulse.com.au  Fact Sheet for Healthcare Providers:  IncredibleEmployment.be  This test is no t yet approved or cleared by the [place] FDA and  has been authorized for detection and/or diagnosis of SARS-CoV-2 by FDA under an Emergency Use Authorization (EUA). This EUA will remain  in effect (meaning this test can be used) for the duration of the COVID-19 declaration under Section  564(b)(1) of the Act, 21 U.S.C.section 360bbb-3(b)(1), unless the authorization is terminated  or revoked sooner.       Influenza A by PCR NEGATIVE NEGATIVE Final   Influenza B by PCR NEGATIVE NEGATIVE Final    Comment: (NOTE) The Xpert Xpress SARS-CoV-2/FLU/RSV plus assay is intended as an aid in the diagnosis of influenza from Nasopharyngeal swab specimens and should not be used as a sole basis for treatment. Nasal washings and aspirates are unacceptable for Xpert Xpress SARS-CoV-2/FLU/RSV testing.  Fact Sheet for Patients: EntrepreneurPulse.com.au  Fact Sheet for Healthcare Providers: IncredibleEmployment.be  This test is not yet approved or cleared by the [place] FDA and has been authorized for detection and/or diagnosis of SARS-CoV-2 by FDA under an Emergency Use Authorization (EUA). This EUA will remain in effect (meaning this test can be used) for the duration of the COVID-19 declaration under Section 564(b)(1) of the Act, 21 U.S.C. section 360bbb-3(b)(1), unless the authorization is terminated or revoked.  Performed at [facility], [place] [address] [place], [place] [address]   Culture, blood (routine x 2)     Status: None (Preliminary result)   Collection Time: 08/08/21  8:04 AM   Specimen: BLOOD RIGHT WRIST  Result Value Ref Range Status   Specimen Description BLOOD RIGHT WRIST  Final   Special Requests   Final    BOTTLES DRAWN AEROBIC AND ANAEROBIC Blood Culture adequate volume    Culture   Final    NO GROWTH 2 DAYS Performed at [facility], [place] [address] [place], [place] [address]    Report Status PENDING  Incomplete  Culture, blood (routine x 2)     Status: None (Preliminary result)   Collection Time: 08/08/21  8:24 AM   Specimen: BLOOD  Result Value Ref Range Status   Specimen Description BLOOD SITE NOT SPECIFIED  Final   Special Requests   Final    BOTTLES DRAWN AEROBIC AND ANAEROBIC Blood Culture results may not be optimal due to an excessive volume of blood received in culture bottles   Culture   Final    NO GROWTH 2 DAYS Performed at [facility], [place] [address] [place], [place] [address]    Report Status PENDING  Incomplete    FURTHER DISCHARGE INSTRUCTIONS:  Get Medicines reviewed and adjusted: Please take all your medications with you for your next visit with your Primary MD  Laboratory/radiological data: Please request your Primary MD to go over all hospital tests and procedure/radiological results at the follow up, please ask your Primary MD to get all Hospital records sent to his/her office.  In some cases, they will be blood work, cultures and biopsy results pending at the time of your discharge. Please request that your primary care M.D. goes through all the records of your hospital data and follows up on these results.  Also Note the following: If you experience worsening of your admission symptoms, develop shortness of breath, life threatening emergency, suicidal or homicidal thoughts you must seek medical attention immediately by calling 911 or calling your MD immediately  if symptoms less severe.  You must read complete instructions/literature along with all the possible adverse reactions/side effects for all the Medicines you take and that have been prescribed to you. Take any new Medicines after you have completely understood and accpet all the possible adverse reactions/side effects.   Do not drive when taking Pain  medications or sleeping medications (Benzodaizepines)  Do not take more than prescribed Pain,  Sleep and Anxiety Medications. It is not advisable to combine anxiety,sleep and pain medications without talking with your primary care practitioner  Special Instructions: If you have smoked or chewed Tobacco  in the last 2 yrs please stop smoking, stop any regular Alcohol  and or any Recreational drug use.  Wear Seat belts while driving.  Please note: You were cared for by a hospitalist during your hospital stay. Once you are discharged, your primary care physician will handle any further medical issues. Please note that NO REFILLS for any discharge medications will be authorized once you are discharged, as it is imperative that you return to your primary care physician (or establish a relationship with a primary care physician if you do not have one) for your post hospital discharge needs so that they can reassess your need for medications and monitor your lab values.  Total Time spent coordinating discharge including counseling, education and face to face time equals 35 minutes.  SignedOren Binet 08/10/2021 11:56 AM

## 2021-08-10 NOTE — TOC Transition Note (Signed)
Transition of Care [facility]) - CM/SW Discharge Note   Patient Details  Name: Briana [name] MRN: [id number] Date of Birth: January 20, 1931  [facility] CM/SW Contact:  Bartholomew Crews, RN Phone Number: [phone] 08/10/2021, 12:10 PM   Clinical Narrative:     Spoke with patient on hospital room phone to discuss transition home. Demographics and PCP verified. States one of her sons lives with her, but Briana [name] also helps her out. Has DME - walker, cane, and wheelchair at home. Agreeable to [facility] PT. Offered choice of agency. Referral accepted by [facility]. HH/Face to Face orders provided. Patient stated that she will call her son, Briana [name], to pick her up. No further TOC needs identified at this time.   Final next level of care: Home w Home Health Services Barriers to Discharge: No Barriers Identified   Patient Goals and CMS Choice Patient states their goals for this hospitalization and ongoing recovery are:: return home with son CMS Medicare.gov Compare Post Acute Care list provided to:: Patient Choice offered to / list presented to : Patient  Discharge Placement                       Discharge Plan and Services In-house Referral: NA Discharge Planning Services: CM Consult Post Acute Care Choice: Home Health          DME Arranged: N/A DME Agency: NA       HH Arranged: PT HH Agency: [place] Date [place]: 08/10/21 Time [place]: 1207 Representative spoke with at [place]: [place] ([place]) Interventions     Readmission Risk Interventions No flowsheet data found.

## 2021-08-11 ENCOUNTER — Telehealth: Payer: Self-pay | Admitting: Internal Medicine

## 2021-08-11 ENCOUNTER — Other Ambulatory Visit: Payer: Self-pay | Admitting: Internal Medicine

## 2021-08-11 ENCOUNTER — Telehealth: Payer: Self-pay

## 2021-08-11 ENCOUNTER — Ambulatory Visit: Payer: Medicare HMO | Admitting: Internal Medicine

## 2021-08-11 DIAGNOSIS — M069 Rheumatoid arthritis, unspecified: Secondary | ICD-10-CM | POA: Diagnosis not present

## 2021-08-11 DIAGNOSIS — M170 Bilateral primary osteoarthritis of knee: Secondary | ICD-10-CM | POA: Diagnosis not present

## 2021-08-11 DIAGNOSIS — B0229 Other postherpetic nervous system involvement: Secondary | ICD-10-CM | POA: Diagnosis not present

## 2021-08-11 DIAGNOSIS — N189 Chronic kidney disease, unspecified: Secondary | ICD-10-CM | POA: Diagnosis not present

## 2021-08-11 DIAGNOSIS — D509 Iron deficiency anemia, unspecified: Secondary | ICD-10-CM | POA: Diagnosis not present

## 2021-08-11 DIAGNOSIS — M05711 Rheumatoid arthritis with rheumatoid factor of right shoulder without organ or systems involvement: Secondary | ICD-10-CM

## 2021-08-11 DIAGNOSIS — I129 Hypertensive chronic kidney disease with stage 1 through stage 4 chronic kidney disease, or unspecified chronic kidney disease: Secondary | ICD-10-CM | POA: Diagnosis not present

## 2021-08-11 DIAGNOSIS — G319 Degenerative disease of nervous system, unspecified: Secondary | ICD-10-CM | POA: Diagnosis not present

## 2021-08-11 DIAGNOSIS — E1122 Type 2 diabetes mellitus with diabetic chronic kidney disease: Secondary | ICD-10-CM | POA: Diagnosis not present

## 2021-08-11 DIAGNOSIS — I679 Cerebrovascular disease, unspecified: Secondary | ICD-10-CM | POA: Diagnosis not present

## 2021-08-11 MED ORDER — TRAMADOL HCL 50 MG PO TABS: 50.0000 mg | ORAL_TABLET | Freq: Two times a day (BID) | ORAL | 1 refills | Status: DC | PRN

## 2021-08-11 NOTE — Telephone Encounter (Signed)
Transition Care Management Follow-up Telephone Call Date of discharge and from where: 08/10/2021 from [facility] How have you been since you were released from the hospital? "Doing petty good, still having the back pain from shingles" Any questions or concerns? No  Items Reviewed: Did the pt receive and understand the discharge instructions provided? Yes  Medications obtained and verified? Yes  Other? No  Any new allergies since your discharge? No  Dietary orders reviewed? Yes; low sodium heart healthy Do you have support at home? Yes ; sons  Home Care and Equipment/Supplies: Were home health services ordered? yes If so, what is the name of the agency? [facility]  Has the agency set up a time to come to the patient's home? Yes, 08/11/2021 Were any new equipment or medical supplies ordered?  No What is the name of the medical supply agency? no Were you able to get the supplies/equipment? not applicable Do you have any questions related to the use of the equipment or supplies? No  Functional Questionnaire: (I = Independent and D = Dependent) ADLs: I  Bathing/Dressing- I  Meal Prep- I  Eating- I  Maintaining continence- I  Transferring/Ambulation- I  Managing Meds- I  Follow up appointments reviewed:  PCP Hospital f/u appt confirmed? Yes  Scheduled to see Pricilla Holm, MD on 08/13/2021 @ 10:20 am. [facility] f/u appt confirmed? No   Are transportation arrangements needed? No  If their condition worsens, is the pt aware to call PCP or go to the Emergency Dept.? Yes Was the patient provided with contact information for the PCP's office or ED? Yes Was to pt encouraged to call back with questions or concerns? Yes

## 2021-08-11 NOTE — Telephone Encounter (Signed)
1.Medication Requested: traMADol (ULTRAM) 50 MG tablet  2. Pharmacy (Name, [facility]): [facility], [place]  Phone:  [phone] Fax:  [phone]   3. On Med List: yes  4. Last Visit with PCP: 03.21.22  5. Next visit date with PCP: 11.02.22 (hosp fu w/ [place])   Agent: Please be advised that RX refills may take up to 3 business days. We ask that you follow-up with your pharmacy.

## 2021-08-13 ENCOUNTER — Ambulatory Visit (INDEPENDENT_AMBULATORY_CARE_PROVIDER_SITE_OTHER): Payer: Medicare HMO | Admitting: Internal Medicine

## 2021-08-13 ENCOUNTER — Encounter: Payer: Self-pay | Admitting: Internal Medicine

## 2021-08-13 ENCOUNTER — Other Ambulatory Visit: Payer: Self-pay

## 2021-08-13 VITALS — BP 130/80 | HR 59 | Resp 18 | Ht 64.0 in | Wt 163.4 lb

## 2021-08-13 DIAGNOSIS — N179 Acute kidney failure, unspecified: Secondary | ICD-10-CM | POA: Diagnosis not present

## 2021-08-13 DIAGNOSIS — B029 Zoster without complications: Secondary | ICD-10-CM | POA: Diagnosis not present

## 2021-08-13 LAB — CULTURE, BLOOD (ROUTINE X 2)
Culture: NO GROWTH
Culture: NO GROWTH
Special Requests: ADEQUATE

## 2021-08-13 LAB — BASIC METABOLIC PANEL
BUN: 22 mg/dL (ref 6–23)
CO2: 25 mEq/L (ref 19–32)
Calcium: 9.5 mg/dL (ref 8.4–10.5)
Chloride: 107 mEq/L (ref 96–112)
Creatinine, Ser: 1.26 mg/dL — ABNORMAL HIGH (ref 0.40–1.20)
GFR: 38.27 mL/min — ABNORMAL LOW (ref >60.00)
Glucose, Bld: 127 mg/dL — ABNORMAL HIGH (ref 70–99)
Potassium: 3.4 mEq/L — ABNORMAL LOW (ref 3.5–5.1)
Sodium: 143 mEq/L (ref 135–145)

## 2021-08-13 MED ORDER — ONDANSETRON 4 MG PO TBDP
4.0000 mg | ORAL_TABLET | Freq: Three times a day (TID) | ORAL | 0 refills | Status: DC | PRN
Start: 2021-08-13 — End: 2022-03-20

## 2021-08-13 NOTE — Progress Notes (Signed)
   Subjective:   Patient ID: Briana [name], female    DOB: December 27, 1930, 87 y.o.   MRN: [id number]  HPI The patient is an 87 YO female coming in for hospital follow up. In for renal failure after taking valtrex for shingles. IVF and renal function improved. Her shingles is resolving and not painful now. Eating and drinking normally. Slightly nauseous.  PMH, [facility], social history reviewed and updated.   Review of Systems  Constitutional: Negative.   HENT: Negative.    Eyes: Negative.   Respiratory:  Negative for cough, chest tightness and shortness of breath.   Cardiovascular:  Negative for chest pain, palpitations and leg swelling.  Gastrointestinal:  Positive for nausea. Negative for abdominal distention, abdominal pain, constipation, diarrhea and vomiting.  Musculoskeletal: Negative.   Skin: Negative.   Neurological: Negative.   Psychiatric/Behavioral: Negative.     Objective:  Physical Exam Constitutional:      Appearance: She is well-developed.  HENT:     Head: Normocephalic and atraumatic.  Cardiovascular:     Rate and Rhythm: Normal rate and regular rhythm.  Pulmonary:     Effort: Pulmonary effort is normal. No respiratory distress.     Breath sounds: Normal breath sounds. No wheezing or rales.  Abdominal:     General: Bowel sounds are normal. There is no distension.     Palpations: Abdomen is soft.     Tenderness: There is no abdominal tenderness. There is no rebound.  Musculoskeletal:     Cervical back: Normal range of motion.  Skin:    General: Skin is warm and dry.  Neurological:     Mental Status: She is alert and oriented to person, place, and time.     Coordination: Coordination normal.    Vitals:   08/13/21 1058  BP: 130/80  Pulse: (!) 59  Resp: 18  SpO2: 98%  Weight: 163 lb 6.4 oz (74.1 kg)  Height: 5\' 4"  (1.626 m)    This visit occurred during the SARS-CoV-2 public health emergency.  Safety protocols were in place, including screening questions  prior to the visit, additional usage of staff PPE, and extensive cleaning of exam room while observing appropriate contact time as indicated for disinfecting solutions.   Assessment & Plan:

## 2021-08-13 NOTE — Patient Instructions (Signed)
We have sent in zofran for nausea to use.   Make sure to drink plenty of fluids.

## 2021-08-15 ENCOUNTER — Encounter: Payer: Self-pay | Admitting: Internal Medicine

## 2021-08-15 NOTE — Assessment & Plan Note (Signed)
Overall resolving and pain is managed with otc. Advised on potential timeline for resolution.

## 2021-08-15 NOTE — Assessment & Plan Note (Signed)
Checking BMP today for assessment. Rx zofran for nausea. Advised to continue increasing fluids.

## 2021-08-19 DIAGNOSIS — M069 Rheumatoid arthritis, unspecified: Secondary | ICD-10-CM | POA: Diagnosis not present

## 2021-08-19 DIAGNOSIS — I129 Hypertensive chronic kidney disease with stage 1 through stage 4 chronic kidney disease, or unspecified chronic kidney disease: Secondary | ICD-10-CM | POA: Diagnosis not present

## 2021-08-19 DIAGNOSIS — G319 Degenerative disease of nervous system, unspecified: Secondary | ICD-10-CM | POA: Diagnosis not present

## 2021-08-19 DIAGNOSIS — D509 Iron deficiency anemia, unspecified: Secondary | ICD-10-CM | POA: Diagnosis not present

## 2021-08-19 DIAGNOSIS — N189 Chronic kidney disease, unspecified: Secondary | ICD-10-CM | POA: Diagnosis not present

## 2021-08-19 DIAGNOSIS — E1122 Type 2 diabetes mellitus with diabetic chronic kidney disease: Secondary | ICD-10-CM | POA: Diagnosis not present

## 2021-08-19 DIAGNOSIS — B0229 Other postherpetic nervous system involvement: Secondary | ICD-10-CM | POA: Diagnosis not present

## 2021-08-19 DIAGNOSIS — M170 Bilateral primary osteoarthritis of knee: Secondary | ICD-10-CM | POA: Diagnosis not present

## 2021-08-19 DIAGNOSIS — I679 Cerebrovascular disease, unspecified: Secondary | ICD-10-CM | POA: Diagnosis not present

## 2021-08-21 DIAGNOSIS — E1122 Type 2 diabetes mellitus with diabetic chronic kidney disease: Secondary | ICD-10-CM | POA: Diagnosis not present

## 2021-08-21 DIAGNOSIS — M170 Bilateral primary osteoarthritis of knee: Secondary | ICD-10-CM | POA: Diagnosis not present

## 2021-08-21 DIAGNOSIS — B0229 Other postherpetic nervous system involvement: Secondary | ICD-10-CM | POA: Diagnosis not present

## 2021-08-21 DIAGNOSIS — I129 Hypertensive chronic kidney disease with stage 1 through stage 4 chronic kidney disease, or unspecified chronic kidney disease: Secondary | ICD-10-CM | POA: Diagnosis not present

## 2021-08-21 DIAGNOSIS — D509 Iron deficiency anemia, unspecified: Secondary | ICD-10-CM | POA: Diagnosis not present

## 2021-08-21 DIAGNOSIS — N189 Chronic kidney disease, unspecified: Secondary | ICD-10-CM | POA: Diagnosis not present

## 2021-08-21 DIAGNOSIS — M069 Rheumatoid arthritis, unspecified: Secondary | ICD-10-CM | POA: Diagnosis not present

## 2021-08-21 DIAGNOSIS — I679 Cerebrovascular disease, unspecified: Secondary | ICD-10-CM | POA: Diagnosis not present

## 2021-08-21 DIAGNOSIS — G319 Degenerative disease of nervous system, unspecified: Secondary | ICD-10-CM | POA: Diagnosis not present

## 2021-08-26 DIAGNOSIS — I679 Cerebrovascular disease, unspecified: Secondary | ICD-10-CM | POA: Diagnosis not present

## 2021-08-26 DIAGNOSIS — E1122 Type 2 diabetes mellitus with diabetic chronic kidney disease: Secondary | ICD-10-CM | POA: Diagnosis not present

## 2021-08-26 DIAGNOSIS — B0229 Other postherpetic nervous system involvement: Secondary | ICD-10-CM | POA: Diagnosis not present

## 2021-08-26 DIAGNOSIS — D509 Iron deficiency anemia, unspecified: Secondary | ICD-10-CM | POA: Diagnosis not present

## 2021-08-26 DIAGNOSIS — M069 Rheumatoid arthritis, unspecified: Secondary | ICD-10-CM | POA: Diagnosis not present

## 2021-08-26 DIAGNOSIS — M170 Bilateral primary osteoarthritis of knee: Secondary | ICD-10-CM | POA: Diagnosis not present

## 2021-08-26 DIAGNOSIS — I129 Hypertensive chronic kidney disease with stage 1 through stage 4 chronic kidney disease, or unspecified chronic kidney disease: Secondary | ICD-10-CM | POA: Diagnosis not present

## 2021-08-26 DIAGNOSIS — N189 Chronic kidney disease, unspecified: Secondary | ICD-10-CM | POA: Diagnosis not present

## 2021-08-26 DIAGNOSIS — G319 Degenerative disease of nervous system, unspecified: Secondary | ICD-10-CM | POA: Diagnosis not present

## 2021-08-28 DIAGNOSIS — Z79899 Other long term (current) drug therapy: Secondary | ICD-10-CM | POA: Diagnosis not present

## 2021-08-28 DIAGNOSIS — M150 Primary generalized (osteo)arthritis: Secondary | ICD-10-CM | POA: Diagnosis not present

## 2021-08-28 DIAGNOSIS — Z6828 Body mass index (BMI) 28.0-28.9, adult: Secondary | ICD-10-CM | POA: Diagnosis not present

## 2021-08-28 DIAGNOSIS — E663 Overweight: Secondary | ICD-10-CM | POA: Diagnosis not present

## 2021-08-28 DIAGNOSIS — M2550 Pain in unspecified joint: Secondary | ICD-10-CM | POA: Diagnosis not present

## 2021-08-28 DIAGNOSIS — M0609 Rheumatoid arthritis without rheumatoid factor, multiple sites: Secondary | ICD-10-CM | POA: Diagnosis not present

## 2021-08-31 ENCOUNTER — Other Ambulatory Visit: Payer: Self-pay | Admitting: Internal Medicine

## 2021-09-02 DIAGNOSIS — M069 Rheumatoid arthritis, unspecified: Secondary | ICD-10-CM | POA: Diagnosis not present

## 2021-09-02 DIAGNOSIS — G319 Degenerative disease of nervous system, unspecified: Secondary | ICD-10-CM | POA: Diagnosis not present

## 2021-09-02 DIAGNOSIS — N189 Chronic kidney disease, unspecified: Secondary | ICD-10-CM | POA: Diagnosis not present

## 2021-09-02 DIAGNOSIS — I129 Hypertensive chronic kidney disease with stage 1 through stage 4 chronic kidney disease, or unspecified chronic kidney disease: Secondary | ICD-10-CM | POA: Diagnosis not present

## 2021-09-02 DIAGNOSIS — D509 Iron deficiency anemia, unspecified: Secondary | ICD-10-CM | POA: Diagnosis not present

## 2021-09-02 DIAGNOSIS — I679 Cerebrovascular disease, unspecified: Secondary | ICD-10-CM | POA: Diagnosis not present

## 2021-09-02 DIAGNOSIS — B0229 Other postherpetic nervous system involvement: Secondary | ICD-10-CM | POA: Diagnosis not present

## 2021-09-02 DIAGNOSIS — E1122 Type 2 diabetes mellitus with diabetic chronic kidney disease: Secondary | ICD-10-CM | POA: Diagnosis not present

## 2021-09-02 DIAGNOSIS — M170 Bilateral primary osteoarthritis of knee: Secondary | ICD-10-CM | POA: Diagnosis not present

## 2021-09-09 DIAGNOSIS — G319 Degenerative disease of nervous system, unspecified: Secondary | ICD-10-CM | POA: Diagnosis not present

## 2021-09-09 DIAGNOSIS — M170 Bilateral primary osteoarthritis of knee: Secondary | ICD-10-CM | POA: Diagnosis not present

## 2021-09-09 DIAGNOSIS — D509 Iron deficiency anemia, unspecified: Secondary | ICD-10-CM | POA: Diagnosis not present

## 2021-09-09 DIAGNOSIS — M069 Rheumatoid arthritis, unspecified: Secondary | ICD-10-CM | POA: Diagnosis not present

## 2021-09-09 DIAGNOSIS — I129 Hypertensive chronic kidney disease with stage 1 through stage 4 chronic kidney disease, or unspecified chronic kidney disease: Secondary | ICD-10-CM | POA: Diagnosis not present

## 2021-09-09 DIAGNOSIS — B0229 Other postherpetic nervous system involvement: Secondary | ICD-10-CM | POA: Diagnosis not present

## 2021-09-09 DIAGNOSIS — I679 Cerebrovascular disease, unspecified: Secondary | ICD-10-CM | POA: Diagnosis not present

## 2021-09-09 DIAGNOSIS — N189 Chronic kidney disease, unspecified: Secondary | ICD-10-CM | POA: Diagnosis not present

## 2021-09-09 DIAGNOSIS — E1122 Type 2 diabetes mellitus with diabetic chronic kidney disease: Secondary | ICD-10-CM | POA: Diagnosis not present

## 2021-09-11 ENCOUNTER — Other Ambulatory Visit: Payer: Self-pay | Admitting: Internal Medicine

## 2021-09-11 DIAGNOSIS — H8113 Benign paroxysmal vertigo, bilateral: Secondary | ICD-10-CM

## 2021-09-16 DIAGNOSIS — D509 Iron deficiency anemia, unspecified: Secondary | ICD-10-CM | POA: Diagnosis not present

## 2021-09-16 DIAGNOSIS — I679 Cerebrovascular disease, unspecified: Secondary | ICD-10-CM | POA: Diagnosis not present

## 2021-09-16 DIAGNOSIS — I129 Hypertensive chronic kidney disease with stage 1 through stage 4 chronic kidney disease, or unspecified chronic kidney disease: Secondary | ICD-10-CM | POA: Diagnosis not present

## 2021-09-16 DIAGNOSIS — G319 Degenerative disease of nervous system, unspecified: Secondary | ICD-10-CM | POA: Diagnosis not present

## 2021-09-16 DIAGNOSIS — E1122 Type 2 diabetes mellitus with diabetic chronic kidney disease: Secondary | ICD-10-CM | POA: Diagnosis not present

## 2021-09-16 DIAGNOSIS — N189 Chronic kidney disease, unspecified: Secondary | ICD-10-CM | POA: Diagnosis not present

## 2021-09-16 DIAGNOSIS — M069 Rheumatoid arthritis, unspecified: Secondary | ICD-10-CM | POA: Diagnosis not present

## 2021-09-16 DIAGNOSIS — M170 Bilateral primary osteoarthritis of knee: Secondary | ICD-10-CM | POA: Diagnosis not present

## 2021-09-16 DIAGNOSIS — B0229 Other postherpetic nervous system involvement: Secondary | ICD-10-CM | POA: Diagnosis not present

## 2021-09-23 DIAGNOSIS — I129 Hypertensive chronic kidney disease with stage 1 through stage 4 chronic kidney disease, or unspecified chronic kidney disease: Secondary | ICD-10-CM | POA: Diagnosis not present

## 2021-09-23 DIAGNOSIS — D509 Iron deficiency anemia, unspecified: Secondary | ICD-10-CM | POA: Diagnosis not present

## 2021-09-23 DIAGNOSIS — I679 Cerebrovascular disease, unspecified: Secondary | ICD-10-CM | POA: Diagnosis not present

## 2021-09-23 DIAGNOSIS — G319 Degenerative disease of nervous system, unspecified: Secondary | ICD-10-CM | POA: Diagnosis not present

## 2021-09-23 DIAGNOSIS — M170 Bilateral primary osteoarthritis of knee: Secondary | ICD-10-CM | POA: Diagnosis not present

## 2021-09-23 DIAGNOSIS — N189 Chronic kidney disease, unspecified: Secondary | ICD-10-CM | POA: Diagnosis not present

## 2021-09-23 DIAGNOSIS — B0229 Other postherpetic nervous system involvement: Secondary | ICD-10-CM | POA: Diagnosis not present

## 2021-09-23 DIAGNOSIS — M069 Rheumatoid arthritis, unspecified: Secondary | ICD-10-CM | POA: Diagnosis not present

## 2021-09-23 DIAGNOSIS — E1122 Type 2 diabetes mellitus with diabetic chronic kidney disease: Secondary | ICD-10-CM | POA: Diagnosis not present

## 2021-09-29 DIAGNOSIS — H43813 Vitreous degeneration, bilateral: Secondary | ICD-10-CM | POA: Diagnosis not present

## 2021-09-29 DIAGNOSIS — H04123 Dry eye syndrome of bilateral lacrimal glands: Secondary | ICD-10-CM | POA: Diagnosis not present

## 2021-09-29 DIAGNOSIS — H401113 Primary open-angle glaucoma, right eye, severe stage: Secondary | ICD-10-CM | POA: Diagnosis not present

## 2021-09-29 DIAGNOSIS — H401121 Primary open-angle glaucoma, left eye, mild stage: Secondary | ICD-10-CM | POA: Diagnosis not present

## 2021-09-29 DIAGNOSIS — H10413 Chronic giant papillary conjunctivitis, bilateral: Secondary | ICD-10-CM | POA: Diagnosis not present

## 2021-09-29 DIAGNOSIS — Z961 Presence of intraocular lens: Secondary | ICD-10-CM | POA: Diagnosis not present

## 2021-09-29 DIAGNOSIS — H26493 Other secondary cataract, bilateral: Secondary | ICD-10-CM | POA: Diagnosis not present

## 2021-11-13 LAB — HM DIABETES EYE EXAM

## 2021-11-21 ENCOUNTER — Other Ambulatory Visit: Payer: Self-pay

## 2021-11-21 ENCOUNTER — Ambulatory Visit (INDEPENDENT_AMBULATORY_CARE_PROVIDER_SITE_OTHER): Payer: Medicare HMO | Admitting: Internal Medicine

## 2021-11-21 VITALS — Ht 64.0 in | Wt 163.0 lb

## 2021-11-21 DIAGNOSIS — Z0000 Encounter for general adult medical examination without abnormal findings: Secondary | ICD-10-CM

## 2021-11-21 DIAGNOSIS — G9332 Myalgic encephalomyelitis/chronic fatigue syndrome: Secondary | ICD-10-CM | POA: Insufficient documentation

## 2021-11-21 DIAGNOSIS — Z79899 Other long term (current) drug therapy: Secondary | ICD-10-CM | POA: Insufficient documentation

## 2021-11-21 DIAGNOSIS — Z6831 Body mass index (BMI) 31.0-31.9, adult: Secondary | ICD-10-CM | POA: Insufficient documentation

## 2021-11-21 DIAGNOSIS — M5430 Sciatica, unspecified side: Secondary | ICD-10-CM | POA: Insufficient documentation

## 2021-11-21 NOTE — Patient Instructions (Signed)
[name] , Thank you for taking time to come for your Medicare Wellness Visit. I appreciate your ongoing commitment to your health goals. Please review the following plan we discussed and let me know if I can assist you in the future.   Screening recommendations/referrals: Colonoscopy: Done 04/08/2009. No longer required due to age. Mammogram: Done 06/19/2021.  Bone Density: Done 04/19/2014.  Recommended yearly ophthalmology/optometry visit for glaucoma screening and checkup Recommended yearly dental visit for hygiene and checkup  Vaccinations: Influenza vaccine: Done 07/15/2021 Repeat annually  Pneumococcal vaccine: Done 04/19/2014, 10/23/2015 and 12/30/2020. Tdap vaccine: Done 05/30/2012 Repeat in 10 years  Shingles vaccine: Done 07/13/2015   Covid-19:Done 10/24/2019 and 12/01/2019  Advanced directives: Advance directive discussed with you today. Even though you declined this today, please call our office should you change your mind, and we can give you the proper paperwork for you to fill out.   Conditions/risks identified: Aim for 30 minutes of exercise or brisk walking each day, drink 6-8 glasses of water and eat lots of fruits and vegetables. KEEP UP THE GOOD WORK!!  Next appointment: Follow up in one year for your annual wellness visit 2024   Preventive Care 65 Years and Older, Female Preventive care refers to lifestyle choices and visits with your health care provider that can promote health and wellness. What does preventive care include? A yearly physical exam. This is also called an annual well check. Dental exams once or twice a year. Routine eye exams. Ask your health care provider how often you should have your eyes checked. Personal lifestyle choices, including: Daily care of your teeth and gums. Regular physical activity. Eating a healthy diet. Avoiding tobacco and drug use. Limiting alcohol use. Practicing safe sex. Taking low-dose aspirin every day. Taking vitamin  and mineral supplements as recommended by your health care provider. What happens during an annual well check? The services and screenings done by your health care provider during your annual well check will depend on your age, overall health, lifestyle risk factors, and family history of disease. Counseling  Your health care provider may ask you questions about your: Alcohol use. Tobacco use. Drug use. Emotional well-being. Home and relationship well-being. Sexual activity. Eating habits. History of falls. Memory and ability to understand (cognition). Work and work Statistician. Reproductive health. Screening  You may have the following tests or measurements: Height, weight, and BMI. Blood pressure. Lipid and cholesterol levels. These may be checked every 5 years, or more frequently if you are over 53 years old. Skin check. Lung cancer screening. You may have this screening every year starting at age 16 if you have a 30-pack-year history of smoking and currently smoke or have quit within the past 15 years. Fecal occult blood test (FOBT) of the stool. You may have this test every year starting at age 43. Flexible sigmoidoscopy or colonoscopy. You may have a sigmoidoscopy every 5 years or a colonoscopy every 10 years starting at age 77. Hepatitis C blood test. Hepatitis B blood test. Sexually transmitted disease (STD) testing. Diabetes screening. This is done by checking your blood sugar (glucose) after you have not eaten for a while (fasting). You may have this done every 1-3 years. Bone density scan. This is done to screen for osteoporosis. You may have this done starting at age 75. Mammogram. This may be done every 1-2 years. Talk to your health care provider about how often you should have regular mammograms. Talk with your health care provider about your test results,  treatment options, and if necessary, the need for more tests. Vaccines  Your health care provider may recommend  certain vaccines, such as: Influenza vaccine. This is recommended every year. Tetanus, diphtheria, and acellular pertussis (Tdap, Td) vaccine. You may need a Td booster every 10 years. Zoster vaccine. You may need this after age 77. Pneumococcal 13-valent conjugate (PCV13) vaccine. One dose is recommended after age 86. Pneumococcal polysaccharide (PPSV23) vaccine. One dose is recommended after age 12. Talk to your health care provider about which screenings and vaccines you need and how often you need them. This information is not intended to replace advice given to you by your health care provider. Make sure you discuss any questions you have with your health care provider. Document Released: 10/25/2015 Document Revised: 06/17/2016 Document Reviewed: 07/30/2015 Elsevier Interactive Patient Education  2017 [place] Prevention in the Home Falls can cause injuries. They can happen to people of all ages. There are many things you can do to make your home safe and to help prevent falls. What can I do on the outside of my home? Regularly fix the edges of walkways and driveways and fix any cracks. Remove anything that might make you trip as you walk through a door, such as a raised step or threshold. Trim any bushes or trees on the path to your home. Use bright outdoor lighting. Clear any walking paths of anything that might make someone trip, such as rocks or tools. Regularly check to see if handrails are loose or broken. Make sure that both sides of any steps have handrails. Any raised decks and porches should have guardrails on the edges. Have any leaves, snow, or ice cleared regularly. Use sand or salt on walking paths during winter. Clean up any spills in your garage right away. This includes oil or grease spills. What can I do in the bathroom? Use night lights. Install grab bars by the toilet and in the tub and shower. Do not use towel bars as grab bars. Use non-skid mats or  decals in the tub or shower. If you need to sit down in the shower, use a plastic, non-slip stool. Keep the floor dry. Clean up any water that spills on the floor as soon as it happens. Remove soap buildup in the tub or shower regularly. Attach bath mats securely with double-sided non-slip rug tape. Do not have throw rugs and other things on the floor that can make you trip. What can I do in the bedroom? Use night lights. Make sure that you have a light by your bed that is easy to reach. Do not use any sheets or blankets that are too big for your bed. They should not hang down onto the floor. Have a firm chair that has side arms. You can use this for support while you get dressed. Do not have throw rugs and other things on the floor that can make you trip. What can I do in the kitchen? Clean up any spills right away. Avoid walking on wet floors. Keep items that you use a lot in easy-to-reach places. If you need to reach something above you, use a strong step stool that has a grab bar. Keep electrical cords out of the way. Do not use floor polish or wax that makes floors slippery. If you must use wax, use non-skid floor wax. Do not have throw rugs and other things on the floor that can make you trip. What can I do with my stairs? Do  not leave any items on the stairs. Make sure that there are handrails on both sides of the stairs and use them. Fix handrails that are broken or loose. Make sure that handrails are as long as the stairways. Check any carpeting to make sure that it is firmly attached to the stairs. Fix any carpet that is loose or worn. Avoid having throw rugs at the top or bottom of the stairs. If you do have throw rugs, attach them to the floor with carpet tape. Make sure that you have a light switch at the top of the stairs and the bottom of the stairs. If you do not have them, ask someone to add them for you. What else can I do to help prevent falls? Wear shoes that: Do not  have high heels. Have rubber bottoms. Are comfortable and fit you well. Are closed at the toe. Do not wear sandals. If you use a stepladder: Make sure that it is fully opened. Do not climb a closed stepladder. Make sure that both sides of the stepladder are locked into place. Ask someone to hold it for you, if possible. Clearly mark and make sure that you can see: Any grab bars or handrails. First and last steps. Where the edge of each step is. Use tools that help you move around (mobility aids) if they are needed. These include: Canes. Walkers. Scooters. Crutches. Turn on the lights when you go into a dark area. Replace any light bulbs as soon as they burn out. Set up your furniture so you have a clear path. Avoid moving your furniture around. If any of your floors are uneven, fix them. If there are any pets around you, be aware of where they are. Review your medicines with your doctor. Some medicines can make you feel dizzy. This can increase your chance of falling. Ask your doctor what other things that you can do to help prevent falls. This information is not intended to replace advice given to you by your health care provider. Make sure you discuss any questions you have with your health care provider. Document Released: 07/25/2009 Document Revised: 03/05/2016 Document Reviewed: 11/02/2014 Elsevier Interactive Patient Education  2017 [employer].

## 2021-11-21 NOTE — Progress Notes (Signed)
Subjective:   Briana [name] is a 88 y.o. female who presents for Medicare Annual (Subsequent) preventive examination. Virtual Visit via Telephone Note  I connected with  Briana [name] on 11/21/21 at  1:00 PM EST by telephone and verified that I am speaking with the correct person using two identifiers.  Location: Patient: HOME Provider: [facility] Persons participating in the virtual visit: patient/Nurse Health Advisor   I discussed the limitations, risks, security and privacy concerns of performing an evaluation and management service by telephone and the availability of in person appointments. The patient expressed understanding and agreed to proceed.  Interactive audio and video telecommunications were attempted between this nurse and patient, however failed, due to patient having technical difficulties OR patient did not have access to video capability.  We continued and completed visit with audio only.  Some vital signs may be absent or patient reported.   Chriss Driver, LPN  Review of Systems     Cardiac Risk Factors include: advanced age (>9men, >31 women);diabetes mellitus;dyslipidemia;hypertension;sedentary lifestyle  PHONE VISIT. PT AT HOME. NURSE AT [facility].    Objective:    Today's Vitals   11/21/21 1307 11/21/21 1312  Weight: 163 lb (73.9 kg)   Height: 5\' 4"  (1.626 m)   PainSc:  2    Body mass index is 27.98 kg/m.  Advanced Directives 11/21/2021 08/08/2021 08/08/2021 08/07/2021 07/11/2020 06/20/2019 06/02/2018  Does Patient Have a Medical Advance Directive? No No No No No Yes No  Type of Advance Directive - - - - - Press photographer;Living will -  Does patient want to make changes to medical advance directive? - - - - - - -  Copy of [place] in Chart? - - - - - No - copy requested -  Would patient like information on creating a medical advance directive? No - Patient declined No - Patient declined No -  Patient declined No - Patient declined No - Patient declined - Yes (ED - Information included in AVS)  Pre-existing out of facility DNR order (yellow form or pink MOST form) - - - - - - -    Current Medications (verified) Outpatient Encounter Medications as of 11/21/2021  Medication Sig   acetaminophen (TYLENOL) 650 MG CR tablet Take 650 mg by mouth every 8 (eight) hours as needed for pain.   amLODipine (NORVASC) 10 MG tablet TAKE 1 TABLET EVERY DAY (Patient taking differently: Take 10 mg by mouth daily.)   atenolol (TENORMIN) 100 MG tablet TAKE 1 TABLET EVERY DAY (Patient taking differently: Take 100 mg by mouth daily.)   atorvastatin (LIPITOR) 10 MG tablet TAKE 1 TABLET EVERY DAY (Patient taking differently: Take 10 mg by mouth at bedtime.)   b complex vitamins tablet Take 1 tablet by mouth daily.   Cholecalciferol (VITAMIN D) 2000 UNITS tablet Take 2,000 Units by mouth daily.   dorzolamide-timolol (COSOPT) 22.3-6.8 MG/ML ophthalmic solution Place 1 drop into both eyes 2 (two) times daily.   fexofenadine (ALLEGRA) 180 MG tablet Take 180 mg by mouth daily as needed for allergies.   folic acid (FOLVITE) 1 MG tablet Take 1 mg by mouth daily.   hydroxychloroquine (PLAQUENIL) 200 MG tablet Take 200 mg by mouth 2 (two) times daily.   latanoprost (XALATAN) 0.005 % ophthalmic solution Place 1 drop into both eyes at bedtime.   lidocaine (LIDODERM) 5 % Place 1 patch onto the skin daily. Remove & Discard patch within 12 hours or as  directed by MD   meclizine (ANTIVERT) 25 MG tablet TAKE 1 TABLET THREE TIMES DAILY AS NEEDED FOR DIZZINESS.   methotrexate 2.5 MG tablet TAKE 6 TABLETS ONE TIME WEEKLY   Netarsudil Dimesylate (RHOPRESSA) 0.02 % SOLN Place 1 drop into the right eye at bedtime.   ondansetron (ZOFRAN ODT) 4 MG disintegrating tablet Take 1 tablet (4 mg total) by mouth every 8 (eight) hours as needed for nausea or vomiting.   traMADol (ULTRAM) 50 MG tablet Take 1 tablet (50 mg total) by mouth  every 12 (twelve) hours as needed for moderate pain.   No facility-administered encounter medications on file as of 11/21/2021.    Allergies (verified) Ibuprofen; Percocet [oxycodone-acetaminophen]; Simvastatin; 2,4-d dimethylamine (amisol); Diltiazem hcl; Hydrochlorothiazide; Lisinopril; Valacyclovir; and Verapamil   History: Past Medical History:  Diagnosis Date   ACUTE URIS OF UNSPECIFIED SITE 11/26/2008   ALLERGIC ARTHRITIS OTHER SPECIFIED SITES 10/22/2010   OA + RA   ALLERGIC RHINITIS 10/28/2007   ARTHRITIS, KNEES, BILATERAL 10/28/2007   BACK PAIN 04/12/2009   BUNIONECTOMY, HX OF 10/28/2007   CELLULITIS&ABSCESS OF HAND EXCEPT FINGERS&THUMB 1/61/0960   Complication of anesthesia    itching and makes me crazy   Family history of anesthesia complication    DAUGHTER HAD HIVES AND " WENT WILD "   HYPERLIPIDEMIA 10/28/2007   HYPERTENSION 10/28/2007   INSOMNIA UNSPECIFIED 10/31/2007   MYOSITIS 10/22/2010   NECK PAIN 12/03/2008   Pain in joint, site unspecified 10/07/2010   WRIST PAIN, LEFT 10/22/2010   Past Surgical History:  Procedure Laterality Date   arthroscopic surgery and rotator cuff repair left shoulder     arthroscopy right knee hx of surgery     BUNIONECTOMY     hx of   FOOT SURGERY     RIGHT   hand surgery for broken finger, remote     JOINT REPLACEMENT Right    knee   TOTAL KNEE ARTHROPLASTY  07/01/2012   Procedure: TOTAL KNEE ARTHROPLASTY;  Surgeon: Yvette Rack., MD;  Location: [place];  Service: Orthopedics;  Laterality: Right;  right total knee arthroplasty   TOTAL KNEE ARTHROPLASTY Left 06/13/2014   dr Percell Miller   TOTAL KNEE ARTHROPLASTY Left 06/13/2014   Procedure: TOTAL KNEE ARTHROPLASTY;  Surgeon: Ninetta Lights, MD;  Location: [place];  Service: Orthopedics;  Laterality: Left;   Family History  Problem Relation Age of Onset   Cancer Brother        lung cancer   Coronary artery disease Other    Heart attack Other    Cancer Sister        Lung, stomach   Social  History   Socioeconomic History   Marital status: Widowed    Spouse name: Not on file   Number of children: 4   Years of education: 12   Highest education level: Not on file  Occupational History   Occupation: ASSN II 1    Employer: [employer]  Tobacco Use   Smoking status: Never   Smokeless tobacco: Never  Vaping Use   Vaping Use: Never used  Substance and Sexual Activity   Alcohol use: No   Drug use: No   Sexual activity: Not Currently  Other Topics Concern   Not on file  Social History Narrative   Married in [place] widowed on 00   4 sons, 1 daughter, 6 grandchildren, 3 great-grands   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   Social Determinants of Health   Financial Resource Strain: Low Risk    Difficulty of Paying Living Expenses: Not very hard  Food Insecurity: No Food Insecurity   Worried About Charity fundraiser in the Last Year: Never true   Ran Out of Food in the Last Year: Never true  Transportation Needs: No Transportation Needs   Lack of Transportation (Medical): No   Lack of Transportation (Non-Medical): No  Physical Activity: Insufficiently Active   Days of Exercise per Week: 3 days   Minutes of Exercise per Session: 30 min  Stress: No Stress Concern Present   Feeling of Stress : Not at all  Social Connections: Moderately Integrated   Frequency of Communication with Friends and Family: More than three times a week   Frequency of Social Gatherings with Friends and Family: More than three times a week   Attends Religious Services: More than 4 times per year   Active Member of [employer] or Organizations: Yes   Attends Archivist Meetings: More than 4 times per year   Marital Status: Widowed    Tobacco Counseling Counseling given: Not Answered   Clinical Intake:  Pre-visit preparation completed: Yes  Pain : 0-10 Pain Score: 2  Pain Type: Chronic pain Pain Location:  Knee Pain Descriptors / Indicators: Aching Pain Onset: More than a month ago Pain Frequency: Intermittent     BMI - recorded: 27.98 Nutritional Status: BMI 25 -29 Overweight Nutritional Risks: None Diabetes: Yes  How often do you need to have someone help you when you read instructions, pamphlets, or other written materials from your doctor or pharmacy?: 1 - Never  Diabetic?Pt states she is not on medication for diabetes and does not check her blood glucose.  Interpreter Needed?: No  Information entered by :: mj Jesyka Slaght, lpn   Activities of Daily Living In your present state of health, do you have any difficulty performing the following activities: 11/21/2021 08/10/2021  Hearing? N -  Vision? Y -  Comment - -  Difficulty concentrating or making decisions? N -  Walking or climbing stairs? N -  Dressing or bathing? N -  Doing errands, shopping? N N  Preparing Food and eating ? N -  Using the Toilet? N -  In the past six months, have you accidently leaked urine? N -  Do you have problems with loss of bowel control? N -  Managing your Medications? N -  Managing your Finances? N -  Housekeeping or managing your Housekeeping? N -  Some recent data might be hidden    Patient Care Team: Janith Lima, MD as PCP - General (Internal Medicine) Gavin Pound, MD as Consulting Physician (Rheumatology) Clent Jacks, MD as Consulting Physician (Ophthalmology) Gatha Mayer, MD as Consulting Physician (Gastroenterology) Charlton Haws, [facility] as Pharmacist (Pharmacist)  Indicate any recent Medical Services you may have received from other than [facility] providers in the past year (date may be approximate).     Assessment:   This is a routine wellness examination for Keilah.  Hearing/Vision screen Hearing Screening - Comments:: No hearing issues.  Vision Screening - Comments:: Glasses. Glaucoma  Dietary issues and exercise activities discussed: Current Exercise Habits: Home  exercise routine, Type of exercise: treadmill, Time (Minutes): 30, Frequency (Times/Week): 3, Weekly Exercise (Minutes/Week): 90, Intensity: Mild, Exercise limited by: cardiac condition(s)   Goals Addressed             This Visit's Progress  Exercise 150 min/wk Moderate Activity       Continue to exercise, when able.        Depression Screen PHQ 2/9 Scores 11/21/2021 07/11/2020 06/20/2019 12/16/2018 06/02/2018 12/02/2017 11/10/2016  PHQ - 2 Score 0 0 0 0 1 0 0  PHQ- 9 Score - - - - 6 - -    Fall Risk Fall Risk  11/21/2021 07/11/2020 06/20/2019 12/16/2018 12/15/2018  Falls in the past year? 0 0 1 0 0  Number falls in past yr: 0 0 0 - 0  Injury with Fall? 0 0 1 - 0  Risk for fall due to : Impaired balance/gait;Impaired mobility No Fall Risks Impaired balance/gait - -  Risk for fall due to: Comment - - - - -  Follow up Falls prevention discussed Falls evaluation completed Falls prevention discussed - Falls evaluation completed    FALL RISK PREVENTION PERTAINING TO THE HOME:  Any stairs in or around the home? Yes  If so, are there any without handrails? No  Home free of loose throw rugs in walkways, pet beds, electrical cords, etc? Yes  Adequate lighting in your home to reduce risk of falls? Yes   ASSISTIVE DEVICES UTILIZED TO PREVENT FALLS:  Life alert? No  Use of a cane, walker or w/c? Yes  Grab bars in the bathroom? Yes  Shower chair or bench in shower? Yes  Elevated toilet seat or a handicapped toilet? Yes   TIMED UP AND GO:  Was the test performed? No .  Phone visit.  Cognitive Function: MMSE - Mini Mental State Exam 06/20/2019 06/02/2018  Not completed: Refused -  Orientation to time - 5  Orientation to Place - 5  Registration - 3  Attention/ Calculation - 3  Recall - 3  Language- name 2 objects - 2  Language- repeat - 1  Language- follow 3 step command - 3  Language- read & follow direction - 1  Write a sentence - 1  Copy design - 1  Total score - 28     6CIT  Screen 11/21/2021  What Year? 0 points  What month? 0 points  What time? 0 points  Count back from 20 0 points  Months in reverse 0 points  Repeat phrase 0 points  Total Score 0    Immunizations Immunization History  Administered Date(s) Administered   Influenza Split 07/03/2012   Influenza, High Dose Seasonal PF 07/24/2013, 07/15/2018, 07/15/2020   Influenza,inj,Quad PF,6+ Mos 08/06/2014   Influenza-Unspecified 07/12/2017, 07/18/2019, 07/15/2021   Moderna Sars-Covid-2 Vaccination 10/24/2019, 11/21/2019   Pneumococcal Conjugate-13 10/23/2015   Pneumococcal Polysaccharide-23 04/19/2014, 12/30/2020   Td 05/30/2012   Zoster, Live 07/13/2015    TDAP status: Due, Education has been provided regarding the importance of this vaccine. Advised may receive this vaccine at local pharmacy or Health Dept. Aware to provide a copy of the vaccination record if obtained from local pharmacy or Health Dept. Verbalized acceptance and understanding.  Flu Vaccine status: Up to date  Pneumococcal vaccine status: Up to date  Covid-19 vaccine status: Completed vaccines  Qualifies for Shingles Vaccine? Yes   Zostavax completed Yes   Shingrix Completed?: No.    Education has been provided regarding the importance of this vaccine. Patient has been advised to call insurance company to determine out of pocket expense if they have not yet received this vaccine. Advised may also receive vaccine at local pharmacy or Health Dept. Verbalized acceptance and understanding.  Screening Tests Health Maintenance  Topic Date Due  Zoster Vaccines- Shingrix (1 of 2) Never done   COVID-19 Vaccine (3 - Moderna risk series) 12/19/2019   FOOT EXAM  05/29/2021   OPHTHALMOLOGY EXAM  09/30/2021   URINE MICROALBUMIN  12/30/2021   HEMOGLOBIN A1C  02/06/2022   TETANUS/TDAP  05/30/2022   Pneumonia Vaccine 75+ Years old  Completed   INFLUENZA VACCINE  Completed   DEXA SCAN  Completed   HPV VACCINES  Aged Out    Health  Maintenance  Health Maintenance Due  Topic Date Due   Zoster Vaccines- Shingrix (1 of 2) Never done   COVID-19 Vaccine (3 - Moderna risk series) 12/19/2019   FOOT EXAM  05/29/2021   OPHTHALMOLOGY EXAM  09/30/2021   URINE MICROALBUMIN  12/30/2021    Colorectal cancer screening: No longer required.   Mammogram status: No longer required due to age.  Bone Density status: Ordered PT DECLINED. Pt provided with contact info and advised to call to schedule appt.  Lung Cancer Screening: (Low Dose CT Chest recommended if Age 66-80 years, 30 pack-year currently smoking OR have quit w/in 15years.) does not qualify.    Additional Screening:  Hepatitis C Screening: does not qualify;   Vision Screening: Recommended annual ophthalmology exams for early detection of glaucoma and other disorders of the eye. Is the patient up to date with their annual eye exam?  Yes  Who is the provider or what is the name of the office in which the patient attends annual eye exams? Pt unable to remember name. If pt is not established with a provider, would they like to be referred to a provider to establish care? No .   Dental Screening: Recommended annual dental exams for proper oral hygiene  Community Resource Referral / Chronic Care Management: CRR required this visit?  No   CCM required this visit?  No      Plan:     I have personally reviewed and noted the following in the patients chart:   Medical and social history Use of alcohol, tobacco or illicit drugs  Current medications and supplements including opioid prescriptions.  Functional ability and status Nutritional status Physical activity Advanced directives List of other physicians Hospitalizations, surgeries, and ER visits in previous 12 months Vitals Screenings to include cognitive, depression, and falls Referrals and appointments  In addition, I have reviewed and discussed with patient certain preventive protocols, quality metrics,  and best practice recommendations. A written personalized care plan for preventive services as well as general preventive health recommendations were provided to patient.     Chriss Driver, LPN   02/14/3975   Nurse Notes: Discussed Shingrix and how to obtain. Declined DEXA.

## 2021-11-26 ENCOUNTER — Other Ambulatory Visit: Payer: Self-pay

## 2021-11-26 ENCOUNTER — Emergency Department (HOSPITAL_BASED_OUTPATIENT_CLINIC_OR_DEPARTMENT_OTHER)
Admission: EM | Admit: 2021-11-26 | Discharge: 2021-11-27 | Disposition: A | Discharge status: Home / Self Care | Payer: Medicare HMO | Attending: Emergency Medicine | Admitting: Emergency Medicine

## 2021-11-26 ENCOUNTER — Encounter (HOSPITAL_BASED_OUTPATIENT_CLINIC_OR_DEPARTMENT_OTHER): Payer: Self-pay

## 2021-11-26 DIAGNOSIS — Z79899 Other long term (current) drug therapy: Secondary | ICD-10-CM | POA: Diagnosis not present

## 2021-11-26 DIAGNOSIS — T783XXA Angioneurotic edema, initial encounter: Secondary | ICD-10-CM | POA: Diagnosis not present

## 2021-11-26 DIAGNOSIS — R220 Localized swelling, mass and lump, head: Secondary | ICD-10-CM | POA: Diagnosis present

## 2021-11-26 MED ORDER — METHYLPREDNISOLONE SODIUM SUCC 125 MG IJ SOLR
125.0000 mg | Freq: Once | INTRAMUSCULAR | Status: AC
  Administered 2021-11-26: 125 mg via INTRAVENOUS
  Filled 2021-11-26: qty 2

## 2021-11-26 MED ORDER — DIPHENHYDRAMINE HCL 50 MG/ML IJ SOLN
25.0000 mg | Freq: Once | INTRAMUSCULAR | Status: AC
  Administered 2021-11-26: 25 mg via INTRAVENOUS
  Filled 2021-11-26: qty 1

## 2021-11-26 MED ORDER — PREDNISONE 10 MG (21) PO TBPK: ORAL_TABLET | ORAL | 0 refills | Status: DC

## 2021-11-26 MED ORDER — EPINEPHRINE 0.3 MG/0.3ML IJ SOAJ
0.3000 mg | Freq: Once | INTRAMUSCULAR | Status: AC
  Administered 2021-11-26: 0.3 mg via INTRAMUSCULAR
  Filled 2021-11-26: qty 0.3

## 2021-11-26 MED ORDER — FAMOTIDINE IN NACL 20-0.9 MG/50ML-% IV SOLN
20.0000 mg | Freq: Once | INTRAVENOUS | Status: AC
  Administered 2021-11-26: 20 mg via INTRAVENOUS
  Filled 2021-11-26: qty 50

## 2021-11-26 NOTE — ED Provider Notes (Signed)
[facility] EMERG[facility] DEPT  Provider Note  CSN: [id number] Arrival date & time: 11/26/21 1910  History Chief Complaint  Patient presents with   Facial Swelling    Briana [name] is a 88 y.o. female reports swelling of eyes and back of throat this evening after she was given her glaucoma eye drops by her son. She also had vomiting which has resolved. No SOB or difficulty breathing/swallowing. She feels like her tongue is thick.    Home Medications Prior to Admission medications   Medication Sig Start Date End Date Taking? Authorizing Provider  predniSONE (STERAPRED UNI-PAK 21 TAB) 10 MG (21) TBPK tablet 10mg  Tabs, 6 day taper. Use as directed 11/26/21  Yes Truddie Hidden, MD  acetaminophen (TYLENOL) 650 MG CR tablet Take 650 mg by mouth every 8 (eight) hours as needed for pain.    [provider]  amLODipine (NORVASC) 10 MG tablet TAKE 1 TABLET EVERY DAY Patient taking differently: Take 10 mg by mouth daily. 06/02/21   Janith Lima, MD  atenolol (TENORMIN) 100 MG tablet TAKE 1 TABLET EVERY DAY Patient taking differently: Take 100 mg by mouth daily. 06/02/21   Janith Lima, MD  atorvastatin (LIPITOR) 10 MG tablet TAKE 1 TABLET EVERY DAY Patient taking differently: Take 10 mg by mouth at bedtime. 06/02/21   Janith Lima, MD  b complex vitamins tablet Take 1 tablet by mouth daily.    [provider]  Cholecalciferol (VITAMIN D) 2000 UNITS tablet Take 2,000 Units by mouth daily.    [provider]  dorzolamide-timolol (COSOPT) 22.3-6.8 MG/ML ophthalmic solution Place 1 drop into both eyes 2 (two) times daily.    [provider]  fexofenadine (ALLEGRA) 180 MG tablet Take 180 mg by mouth daily as needed for allergies.    [provider]  folic acid (FOLVITE) 1 MG tablet Take 1 mg by mouth daily.    [provider]  hydroxychloroquine (PLAQUENIL) 200 MG tablet Take 200 mg by mouth 2 (two) times daily.    Gavin Pound, MD  latanoprost (XALATAN) 0.005 % ophthalmic solution Place 1 drop into both eyes at bedtime.    [provider]  lidocaine (LIDODERM) 5 % Place 1 patch onto the skin daily. Remove & Discard patch within 12 hours or as directed by MD 08/10/21   Jonetta Osgood, MD  meclizine (ANTIVERT) 25 MG tablet TAKE 1 TABLET THREE TIMES DAILY AS NEEDED FOR DIZZINESS. 09/11/21   Janith Lima, MD  methotrexate 2.5 MG tablet TAKE 6 TABLETS ONE TIME WEEKLY    [provider]  Netarsudil Dimesylate (RHOPRESSA) 0.02 % SOLN Place 1 drop into the right eye at bedtime.    [provider]  ondansetron (ZOFRAN ODT) 4 MG disintegrating tablet Take 1 tablet (4 mg total) by mouth every 8 (eight) hours as needed for nausea or vomiting. 08/13/21   Hoyt Koch, MD  traMADol (ULTRAM) 50 MG tablet Take 1 tablet (50 mg total) by mouth every 12 (twelve) hours as needed for moderate pain. 08/11/21   Janith Lima, MD     Allergies    Ibuprofen; Percocet [oxycodone-acetaminophen]; Simvastatin; 2,4-d dimethylamine; Diltiazem hcl; Hydrochlorothiazide; Lisinopril; Valacyclovir; and Verapamil   Review of Systems   Review of Systems Please see HPI for pertinent positives and negatives  Physical Exam BP (!) 141/55    Pulse 64    Temp 98.4 F (36.9 C) (Oral)    Resp 20  Ht 5\' 4"  (1.626 m)    Wt 73.9 kg    SpO2 97%    BMI 27.97 kg/m   Physical Exam Vitals and nursing note reviewed.  Constitutional:      Appearance: Normal appearance.  HENT:     Head: Normocephalic and atraumatic.     Nose: Nose normal.     Mouth/Throat:     Mouth: Mucous membranes are moist.     Comments: Mild angioedema of uvula and soft palate Eyes:     Extraocular Movements: Extraocular movements intact.     Conjunctiva/sclera: Conjunctivae normal.     Comments: Moderate conjunctival injection  Cardiovascular:     Rate and Rhythm: Normal rate.  Pulmonary:     Effort: Pulmonary effort is normal.      Breath sounds: Normal breath sounds. No stridor. No wheezing or rales.  Abdominal:     General: Abdomen is flat.     Palpations: Abdomen is soft.     Tenderness: There is no abdominal tenderness.  Musculoskeletal:        General: No swelling. Normal range of motion.     Cervical back: Neck supple.  Skin:    General: Skin is warm and dry.  Neurological:     General: No focal deficit present.     Mental Status: She is alert.  Psychiatric:        Mood and Affect: Mood normal.    ED Results / Procedures / Treatments   EKG None  Procedures .Critical Care Performed by: Truddie Hidden, MD Authorized by: Truddie Hidden, MD   Critical care provider statement:    Critical care time (minutes):  45   Critical care time was exclusive of:  Separately billable procedures and treating other patients   Critical care was necessary to treat or prevent imminent or life-threatening deterioration of the following conditions:  Respiratory failure   Critical care was time spent personally by me on the following activities:  Development of treatment plan with patient or surrogate, discussions with consultants, evaluation of patient's response to treatment, examination of patient, ordering and review of laboratory studies, ordering and review of radiographic studies, ordering and performing treatments and interventions, pulse oximetry, re-evaluation of patient's condition and review of old charts  Medications Ordered in the ED Medications  methylPREDNISolone sodium succinate (SOLU-MEDROL) 125 mg/2 mL injection 125 mg (125 mg Intravenous Given 11/26/21 1959)  diphenhydrAMINE (BENADRYL) injection 25 mg (25 mg Intravenous Given 11/26/21 2002)  EPINEPHrine (EPI-PEN) injection 0.3 mg (0.3 mg Intramuscular Given 11/26/21 1954)  famotidine (PEPCID) IVPB 20 mg premix (0 mg Intravenous Stopped 11/26/21 2033)    Initial Impression and Plan  Patient with evidence of allergic reaction affecting her soft  palate/uvula and her conjunctiva/periorbital area. She does not have any evidence of airway compromise. Denies history of CAD or other heart problems, will give steroids, antihistamines and EpiPen.   ED Course   Clinical Course as of 11/26/21 2334  Wed Nov 26, 2021  2256 Facial and pharyngeal swelling improving. Continue to monitor for 4 hours post Epi injection.  [CS]  2334 Given presenting complaint, I considered that admission might be necessary. After management of her symptoms in the ED, admission to the hospital is not indicated at this time.   [CS]    Clinical Course User Index [CS] Truddie Hidden, MD     MDM Rules/Calculators/A&P Medical Decision Making Problems Addressed: Angioedema, initial encounter: acute illness or injury that poses a threat to life  or bodily functions  Risk Prescription drug management. Drug therapy requiring intensive monitoring for toxicity. Decision regarding hospitalization.    Final Clinical Impression(s) / ED Diagnoses Final diagnoses:  Angioedema, initial encounter    Rx / DC Orders ED Discharge Orders          Ordered    predniSONE (STERAPRED UNI-PAK 21 TAB) 10 MG (21) TBPK tablet        11/26/21 2333             Truddie Hidden, MD 11/26/21 [id number]

## 2021-11-26 NOTE — ED Notes (Signed)
Voice is more clear, patient states that her throat feels less swollen

## 2021-11-26 NOTE — ED Triage Notes (Signed)
Patient here POV from Home with Facial Swelling.  Patient endorses shortly after taking Glaucoma Medication (Eye Drops) the Patient began to have Bilateral Eye Swelling and Facial Swelling.  Also endorses Vomiting. No Diarrhea. Speaking in Complete Sentences but Daughter states she normally doesn't sound how she is speaking currently.  NAD noted during Triage. A&Ox4. GCS 15. BIB Wheelchair.

## 2021-11-26 NOTE — ED Notes (Signed)
Pt is able to speak in full sentences, states that her throat still feels slightly swollen.

## 2021-12-03 DIAGNOSIS — H26493 Other secondary cataract, bilateral: Secondary | ICD-10-CM | POA: Diagnosis not present

## 2021-12-03 DIAGNOSIS — H401113 Primary open-angle glaucoma, right eye, severe stage: Secondary | ICD-10-CM | POA: Diagnosis not present

## 2021-12-03 DIAGNOSIS — H04123 Dry eye syndrome of bilateral lacrimal glands: Secondary | ICD-10-CM | POA: Diagnosis not present

## 2021-12-03 DIAGNOSIS — H10413 Chronic giant papillary conjunctivitis, bilateral: Secondary | ICD-10-CM | POA: Diagnosis not present

## 2021-12-03 DIAGNOSIS — H43813 Vitreous degeneration, bilateral: Secondary | ICD-10-CM | POA: Diagnosis not present

## 2021-12-03 DIAGNOSIS — Z961 Presence of intraocular lens: Secondary | ICD-10-CM | POA: Diagnosis not present

## 2021-12-04 DIAGNOSIS — R768 Other specified abnormal immunological findings in serum: Secondary | ICD-10-CM | POA: Diagnosis not present

## 2021-12-04 DIAGNOSIS — E663 Overweight: Secondary | ICD-10-CM | POA: Diagnosis not present

## 2021-12-04 DIAGNOSIS — Z6828 Body mass index (BMI) 28.0-28.9, adult: Secondary | ICD-10-CM | POA: Diagnosis not present

## 2021-12-04 DIAGNOSIS — Z79899 Other long term (current) drug therapy: Secondary | ICD-10-CM | POA: Diagnosis not present

## 2021-12-04 DIAGNOSIS — M2550 Pain in unspecified joint: Secondary | ICD-10-CM | POA: Diagnosis not present

## 2021-12-04 DIAGNOSIS — M0609 Rheumatoid arthritis without rheumatoid factor, multiple sites: Secondary | ICD-10-CM | POA: Diagnosis not present

## 2021-12-04 DIAGNOSIS — M150 Primary generalized (osteo)arthritis: Secondary | ICD-10-CM | POA: Diagnosis not present

## 2022-01-01 ENCOUNTER — Telehealth: Payer: Medicare HMO

## 2022-01-01 ENCOUNTER — Ambulatory Visit (INDEPENDENT_AMBULATORY_CARE_PROVIDER_SITE_OTHER): Payer: Medicare HMO | Admitting: Internal Medicine

## 2022-01-01 ENCOUNTER — Encounter: Payer: Self-pay | Admitting: Internal Medicine

## 2022-01-01 ENCOUNTER — Other Ambulatory Visit: Payer: Self-pay

## 2022-01-01 VITALS — BP 154/80 | HR 56 | Temp 97.6°F | Ht 64.0 in | Wt 165.0 lb

## 2022-01-01 DIAGNOSIS — I10 Essential (primary) hypertension: Secondary | ICD-10-CM

## 2022-01-01 DIAGNOSIS — M05711 Rheumatoid arthritis with rheumatoid factor of right shoulder without organ or systems involvement: Secondary | ICD-10-CM | POA: Diagnosis not present

## 2022-01-01 DIAGNOSIS — N1832 Chronic kidney disease, stage 3b: Secondary | ICD-10-CM

## 2022-01-01 DIAGNOSIS — E785 Hyperlipidemia, unspecified: Secondary | ICD-10-CM

## 2022-01-01 DIAGNOSIS — Z23 Encounter for immunization: Secondary | ICD-10-CM

## 2022-01-01 DIAGNOSIS — Z0000 Encounter for general adult medical examination without abnormal findings: Secondary | ICD-10-CM | POA: Diagnosis not present

## 2022-01-01 DIAGNOSIS — E118 Type 2 diabetes mellitus with unspecified complications: Secondary | ICD-10-CM | POA: Diagnosis not present

## 2022-01-01 DIAGNOSIS — M05712 Rheumatoid arthritis with rheumatoid factor of left shoulder without organ or systems involvement: Secondary | ICD-10-CM

## 2022-01-01 DIAGNOSIS — E1122 Type 2 diabetes mellitus with diabetic chronic kidney disease: Secondary | ICD-10-CM | POA: Diagnosis not present

## 2022-01-01 DIAGNOSIS — M170 Bilateral primary osteoarthritis of knee: Secondary | ICD-10-CM | POA: Diagnosis not present

## 2022-01-01 LAB — CBC WITH DIFFERENTIAL/PLATELET
Basophils Absolute: 0.1 10*3/uL (ref 0.0–0.1)
Basophils Relative: 0.7 % (ref 0.0–3.0)
Eosinophils Absolute: 0.2 10*3/uL (ref 0.0–0.7)
Eosinophils Relative: 3.1 % (ref 0.0–5.0)
HCT: 37.7 % (ref 36.0–46.0)
Hemoglobin: 12.7 g/dL (ref 12.0–15.0)
Lymphocytes Relative: 20.3 % (ref 12.0–46.0)
Lymphs Abs: 1.6 10*3/uL (ref 0.7–4.0)
MCHC: 33.7 g/dL (ref 30.0–36.0)
MCV: 91.3 fl (ref 78.0–100.0)
Monocytes Absolute: 0.6 10*3/uL (ref 0.1–1.0)
Monocytes Relative: 8 % (ref 3.0–12.0)
Neutro Abs: 5.3 10*3/uL (ref 1.4–7.7)
Neutrophils Relative %: 67.9 % (ref 43.0–77.0)
Platelets: 177 10*3/uL (ref 150.0–400.0)
RBC: 4.13 Mil/uL (ref 3.87–5.11)
RDW: 14.8 % (ref 11.5–15.5)
WBC: 7.8 10*3/uL (ref 4.0–10.5)

## 2022-01-01 LAB — BASIC METABOLIC PANEL
BUN: 19 mg/dL (ref 6–23)
CO2: 29 mEq/L (ref 19–32)
Calcium: 9.2 mg/dL (ref 8.4–10.5)
Chloride: 104 mEq/L (ref 96–112)
Creatinine, Ser: 0.98 mg/dL (ref 0.40–1.20)
GFR: 51.59 mL/min — ABNORMAL LOW (ref >60.00)
Glucose, Bld: 121 mg/dL — ABNORMAL HIGH (ref 70–99)
Potassium: 3.9 mEq/L (ref 3.5–5.1)
Sodium: 140 mEq/L (ref 135–145)

## 2022-01-01 LAB — HEPATIC FUNCTION PANEL
ALT: 93 U/L — ABNORMAL HIGH (ref 0–35)
AST: 52 U/L — ABNORMAL HIGH (ref 0–37)
Albumin: 4.1 g/dL (ref 3.5–5.2)
Alkaline Phosphatase: 105 U/L (ref 39–117)
Bilirubin, Direct: 0.2 mg/dL (ref 0.0–0.3)
Total Bilirubin: 0.7 mg/dL (ref 0.2–1.2)
Total Protein: 7.1 g/dL (ref 6.0–8.3)

## 2022-01-01 LAB — URINALYSIS, ROUTINE W REFLEX MICROSCOPIC
Bilirubin Urine: NEGATIVE
Hgb urine dipstick: NEGATIVE
Ketones, ur: NEGATIVE
Leukocytes,Ua: NEGATIVE
Nitrite: NEGATIVE
RBC / HPF: NONE SEEN (ref 0–?)
Specific Gravity, Urine: 1.025 (ref 1.000–1.030)
Urine Glucose: NEGATIVE
Urobilinogen, UA: 0.2 (ref 0.0–1.0)
pH: 6 (ref 5.0–8.0)

## 2022-01-01 LAB — MICROALBUMIN / CREATININE URINE RATIO
Creatinine,U: 189.6 mg/dL
Microalb Creat Ratio: 3.5 mg/g (ref 0.0–30.0)
Microalb, Ur: 6.7 mg/dL — ABNORMAL HIGH (ref 0.0–1.9)

## 2022-01-01 LAB — LIPID PANEL
Cholesterol: 179 mg/dL (ref 0–200)
HDL: 101.4 mg/dL (ref >39.00)
LDL Cholesterol: 64 mg/dL (ref 0–99)
NonHDL: 77.94
Total CHOL/HDL Ratio: 2
Triglycerides: 68 mg/dL (ref 0.0–149.0)
VLDL: 13.6 mg/dL (ref 0.0–40.0)

## 2022-01-01 LAB — TSH: TSH: 3.6 u[IU]/mL (ref 0.35–5.50)

## 2022-01-01 LAB — HEMOGLOBIN A1C: Hgb A1c MFr Bld: 6.4 % (ref 4.6–6.5)

## 2022-01-01 MED ORDER — AMLODIPINE BESYLATE 10 MG PO TABS: 10.0000 mg | ORAL_TABLET | Freq: Every day | ORAL | 1 refills | Status: DC

## 2022-01-01 MED ORDER — ATORVASTATIN CALCIUM 10 MG PO TABS: 10.0000 mg | ORAL_TABLET | Freq: Every day | ORAL | 1 refills | Status: DC

## 2022-01-01 MED ORDER — TRAMADOL HCL 50 MG PO TABS: 50.0000 mg | ORAL_TABLET | Freq: Two times a day (BID) | ORAL | 1 refills | Status: DC | PRN

## 2022-01-01 MED ORDER — ATENOLOL 100 MG PO TABS: 100.0000 mg | ORAL_TABLET | Freq: Every day | ORAL | 1 refills | Status: DC

## 2022-01-01 NOTE — Progress Notes (Unsigned)
? ?Chronic Care Management ?Pharmacy Note ? ?01/01/2022 ?Name:  Briana [name] MRN:  [id number] DOB:  August 17, 1931 ? ?Summary: ?-Breast biopsy scheduled 10/12 ?-Rheumatology started hydroxychloroquine 200 mg BID 06/03/21 ?-Pt reports [facility] She does not have vaccine card readily accessible during call. ? ?Recommendations/Changes made from today's visit: ?-Advised to get flu vaccine ? ? ?Subjective: ?Briana [name] is an 88 y.o. year old female who is a primary patient of Janith Lima, MD.  The CCM team was consulted for assistance with disease management and care coordination needs.   ?  ?Engaged with patient by telephone for follow up visit in response to provider referral for pharmacy case management and/or care coordination services.  ? ?Consent to Services:  ?The patient was given information about Chronic Care Management services, agreed to services, and gave verbal consent prior to initiation of services.  Please see initial visit note for detailed documentation.  ? ?Patient Care Team: ?Janith Lima, MD as PCP - General (Internal Medicine) ?Gavin Pound, MD as Consulting Physician (Rheumatology) ?Clent Jacks, MD as Consulting Physician (Ophthalmology) ?Gatha Mayer, MD as Consulting Physician (Gastroenterology) ?Foltanski, Cleaster Corin, [facility] as Pharmacist (Pharmacist) ? ?Recent office visits: ?Recent office visits: ?11/21/2021 - Dr. Ronnald Ramp - no changes to medications  ?08/13/2022 - Dr. Sharlet Salina - hospital follow up - ondansetron for nausea ?08/04/2021 - Dr. Sharlet Salina - urgent care follow up - shingles outbreak - rx'd lidocaine 5% patches, gabapentin ? ?Recent consult visits: ?09/29/2021 - Dr. Katy Fitch - Ophthalmology - note not available  ? ?Hospital visits: ?11/23/2021 - ED visit - facial swelling - medrol, epinephrine, diphenhydramine, and famotidine given IV - discharged with prednisone taper  ? ?08/07/2021-08/10/2021 [facility] admission for acute renal failure - stop  gabapentin, valacyclovir, and mtx - restart mtx once kidney function has stabilized    ? ?08/02/2021 - Urgent Care visit - Shingles - rx'd valacyclovir  ? ?Objective: ? ?Lab Results  ?Component Value Date  ? CREATININE 0.98 01/01/2022  ? BUN 19 01/01/2022  ? GFR 51.59 (L) 01/01/2022  ? GFRNONAA 32 (L) 08/10/2021  ? GFRAA 63 05/29/2020  ? NA 140 01/01/2022  ? K 3.9 01/01/2022  ? CALCIUM 9.2 01/01/2022  ? CO2 29 01/01/2022  ? GLUCOSE 121 (H) 01/01/2022  ? ? ?Lab Results  ?Component Value Date/Time  ? HGBA1C 6.4 01/01/2022 09:22 AM  ? HGBA1C 6.2 (H) 08/08/2021 08:00 AM  ? GFR 51.59 (L) 01/01/2022 09:22 AM  ? GFR 38.27 (L) 08/13/2021 11:15 AM  ? MICROALBUR 6.7 (H) 01/01/2022 09:22 AM  ? MICROALBUR 6.3 (H) 12/30/2020 10:31 AM  ?  ?Last diabetic Eye exam:  ?Lab Results  ?Component Value Date/Time  ? HMDIABEYEEXA No Retinopathy 09/30/2020 12:00 AM  ?  ?Last diabetic Foot exam: No results found for: HMDIABFOOTEX  ? ?Lab Results  ?Component Value Date  ? CHOL 179 01/01/2022  ? HDL 101.40 01/01/2022  ? Spur 64 01/01/2022  ? LDLDIRECT 104.5 04/25/2010  ? TRIG 68.0 01/01/2022  ? CHOLHDL 2 01/01/2022  ? ? ? ?  Latest Ref Rng & Units 01/01/2022  ?  9:22 AM 08/08/2021  ?  7:01 AM 08/07/2021  ?  4:58 PM  ?Hepatic Function  ?Total Protein 6.0 - 8.3 g/dL 7.1   7.4   7.1    ?Albumin 3.5 - 5.2 g/dL 4.1   3.7   3.7    ?AST 0 - 37 U/L 52   40   47    ?  ALT 0 - 35 U/L 93   39   30    ?Alk Phosphatase 39 - 117 U/L 105   86   89    ?Total Bilirubin 0.2 - 1.2 mg/dL 0.7   0.7   1.1    ?Bilirubin, Direct 0.0 - 0.3 mg/dL 0.2      ? ? ?Lab Results  ?Component Value Date/Time  ? TSH 3.60 01/01/2022 09:22 AM  ? TSH 1.84 12/30/2020 10:31 AM  ? ? ? ?  Latest Ref Rng & Units 01/01/2022  ?  9:22 AM 08/08/2021  ?  8:00 AM 08/07/2021  ?  4:58 PM  ?CBC  ?WBC 4.0 - 10.5 K/uL 7.8   11.7   11.9    ?Hemoglobin 12.0 - 15.0 g/dL 12.7   13.9   13.3    ?Hematocrit 36.0 - 46.0 % 37.7   39.6   39.0    ?Platelets 150.0 - 400.0 K/uL 177.0   161   168    ? ? ?No  results found for: VD25OH ? ?Clinical ASCVD: No  ?The ASCVD Risk score (Arnett DK, et al., 2019) failed to calculate for the following reasons: ?  The 2019 ASCVD risk score is only valid for ages 90 to 48   ? ? ?  11/21/2021  ?  1:15 PM 07/11/2020  ? 11:00 AM 06/20/2019  ?  2:02 PM  ?Depression screen PHQ 2/9  ?Decreased Interest 0 0 0  ?Down, Depressed, Hopeless 0 0 0  ?PHQ - 2 Score 0 0 0  ?  ? ? ?Social History  ? ?Tobacco Use  ?Smoking Status Never  ?Smokeless Tobacco Never  ? ?BP Readings from Last 3 Encounters:  ?01/01/22 (!) 154/80  ?11/26/21 (!) 141/55  ?08/13/21 130/80  ? ?Pulse Readings from Last 3 Encounters:  ?01/01/22 (!) 56  ?11/26/21 64  ?08/13/21 (!) 59  ? ?Wt Readings from Last 3 Encounters:  ?01/01/22 165 lb (74.8 kg)  ?11/26/21 162 lb 14.7 oz (73.9 kg)  ?11/21/21 163 lb (73.9 kg)  ? ?BMI Readings from Last 3 Encounters:  ?01/01/22 28.32 kg/m?  ?11/26/21 27.97 kg/m?  ?11/21/21 27.98 kg/m?  ? ? ?Assessment/Interventions: Review of patient past medical history, allergies, medications, health status, including review of consultants reports, laboratory and other test data, was performed as part of comprehensive evaluation and provision of chronic care management services.  ? ?SDOH:  (Social Determinants of Health) assessments and interventions performed: Yes ?  ?SDOH Screenings  ? ?Alcohol Screen: Low Risk   ? Last Alcohol Screening Score (AUDIT): 0  ?Depression (PHQ2-9): Low Risk   ? PHQ-2 Score: 0  ?Financial Resource Strain: Low Risk   ? Difficulty of Paying Living Expenses: Not very hard  ?Food Insecurity: No Food Insecurity  ? Worried About Charity fundraiser in the Last Year: Never true  ? Ran Out of Food in the Last Year: Never true  ?Housing: Low Risk   ? Last Housing Risk Score: 0  ?Physical Activity: Insufficiently Active  ? Days of Exercise per Week: 3 days  ? Minutes of Exercise per Session: 30 min  ?Social Connections: Moderately Integrated  ? Frequency of Communication with Friends and  Family: More than three times a week  ? Frequency of Social Gatherings with Friends and Family: More than three times a week  ? Attends Religious Services: More than 4 times per year  ? Active Member of Clubs or Organizations: Yes  ? Attends Archivist Meetings:  More than 4 times per year  ? Marital Status: Widowed  ?Stress: No Stress Concern Present  ? Feeling of Stress : Not at all  ?Tobacco Use: Low Risk   ? Smoking Tobacco Use: Never  ? Smokeless Tobacco Use: Never  ? Passive Exposure: Not on file  ?Transportation Needs: No Transportation Needs  ? Lack of Transportation (Medical): No  ? Lack of Transportation (Non-Medical): No  ? ? ? ?CCM Care Plan ? ?Allergies  ?Allergen Reactions  ? Ibuprofen Anaphylaxis  ? Percocet [Oxycodone-Acetaminophen]   ?  nausea  ? Simvastatin Other (See Comments)  ?  muscle aches  ? 2,4-D Dimethylamine Other (See Comments)  ? Diltiazem Hcl   ?  REACTION: ANGIO EDEMA  ? Hydrochlorothiazide   ?  REACTION: SWELLING  ? Lisinopril   ?  REACTION: SWELLING  ? Valacyclovir Other (See Comments)  ? Verapamil   ?  REACTION: SWELLING  ? ? ?Medications Reviewed Today   ? ? Reviewed by Janith Lima, MD (Physician) on 01/01/22 at [phone]  Med List Status: <None>  ? ?Medication Order Taking? Sig Documenting Provider Last Dose Status Informant  ?acetaminophen (TYLENOL) 650 MG CR tablet [id number] Yes Take 650 mg by mouth every 8 (eight) hours as needed for pain. [provider] Taking Active Self  ?amLODipine (NORVASC) 10 MG tablet [id number]  Take 1 tablet (10 mg total) by mouth daily. Janith Lima, MD  Active   ?atenolol (TENORMIN) 100 MG tablet [id number]  Take 1 tablet (100 mg total) by mouth daily. Janith Lima, MD  Active   ?atorvastatin (LIPITOR) 10 MG tablet [id number]  Take 1 tablet (10 mg total) by mouth at bedtime. Janith Lima, MD  Active   ?b complex vitamins tablet [id number] Yes Take 1 tablet by mouth daily. [provider] Taking Active Self   ?Cholecalciferol (VITAMIN D) 2000 UNITS tablet [id number] Yes Take 2,000 Units by mouth daily. [provider] Taking Active Self  ?dorzolamide-timolol (COSOPT) 22.3-6.8 MG/ML ophthalmic solution [id number] Yes Plac

## 2022-01-01 NOTE — Patient Instructions (Signed)

## 2022-01-01 NOTE — Progress Notes (Signed)
Subjective:  Patient ID: Briana [name], female    DOB: Jun 20, 1931  Age: 88 y.o. MRN: [id number]  CC: Annual Exam  This visit occurred during the SARS-CoV-2 public health emergency.  Safety protocols were in place, including screening questions prior to the visit, additional usage of staff PPE, and extensive cleaning of exam room while observing appropriate contact time as indicated for disinfecting solutions.    HPI Briana [name] presents for a CPX and f/up -  She denies DOE, SOB, CP, edema, or palpitations.  She recently saw her rheumatologist and was told that she had elevated liver enzymes.  She denies abdominal pain, nausea, vomiting, or weight loss.  Outpatient Medications Prior to Visit  Medication Sig Dispense Refill   acetaminophen (TYLENOL) 650 MG CR tablet Take 650 mg by mouth every 8 (eight) hours as needed for pain.     b complex vitamins tablet Take 1 tablet by mouth daily.     Cholecalciferol (VITAMIN D) 2000 UNITS tablet Take 2,000 Units by mouth daily.     dorzolamide-timolol (COSOPT) 22.3-6.8 MG/ML ophthalmic solution Place 1 drop into both eyes 2 (two) times daily.     fexofenadine (ALLEGRA) 180 MG tablet Take 180 mg by mouth daily as needed for allergies.     folic acid (FOLVITE) 1 MG tablet Take 1 mg by mouth daily.     hydroxychloroquine (PLAQUENIL) 200 MG tablet Take 200 mg by mouth 2 (two) times daily.     latanoprost (XALATAN) 0.005 % ophthalmic solution Place 1 drop into both eyes at bedtime.     lidocaine (LIDODERM) 5 % Place 1 patch onto the skin daily. Remove & Discard patch within 12 hours or as directed by MD 10 patch 0   meclizine (ANTIVERT) 25 MG tablet TAKE 1 TABLET THREE TIMES DAILY AS NEEDED FOR DIZZINESS. 90 tablet 3   methotrexate 2.5 MG tablet TAKE 6 TABLETS ONE TIME WEEKLY     Netarsudil Dimesylate (RHOPRESSA) 0.02 % SOLN Place 1 drop into the right eye at bedtime.     ondansetron (ZOFRAN ODT) 4 MG disintegrating tablet Take 1 tablet (4 mg  total) by mouth every 8 (eight) hours as needed for nausea or vomiting. 20 tablet 0   predniSONE (STERAPRED UNI-PAK 21 TAB) 10 MG (21) TBPK tablet 10mg  Tabs, 6 day taper. Use as directed 1 each 0   amLODipine (NORVASC) 10 MG tablet TAKE 1 TABLET EVERY DAY (Patient taking differently: Take 10 mg by mouth daily.) 90 tablet 1   atenolol (TENORMIN) 100 MG tablet TAKE 1 TABLET EVERY DAY (Patient taking differently: Take 100 mg by mouth daily.) 90 tablet 1   atorvastatin (LIPITOR) 10 MG tablet TAKE 1 TABLET EVERY DAY (Patient taking differently: Take 10 mg by mouth at bedtime.) 90 tablet 1   traMADol (ULTRAM) 50 MG tablet Take 1 tablet (50 mg total) by mouth every 12 (twelve) hours as needed for moderate pain. 180 tablet 1   No facility-administered medications prior to visit.    ROS Review of Systems  Constitutional:  Negative for chills, diaphoresis, fatigue and fever.  HENT: Negative.    Eyes: Negative.   Respiratory:  Negative for cough, chest tightness, shortness of breath and wheezing.   Cardiovascular:  Negative for chest pain, palpitations and leg swelling.  Gastrointestinal:  Negative for abdominal pain, constipation, diarrhea, nausea and vomiting.  Endocrine: Negative.   Genitourinary: Negative.  Negative for difficulty urinating.  Musculoskeletal:  Positive for arthralgias. Negative for joint swelling and  myalgias.  Neurological: Negative.  Negative for speech difficulty.  Hematological:  Negative for adenopathy. Does not bruise/bleed easily.  Psychiatric/Behavioral: Negative.     Objective:  BP (!) 154/80 (BP Location: Left Arm, Patient Position: Sitting, Cuff Size: Large)   Pulse (!) 56   Temp 97.6 F (36.4 C) (Oral)   Ht 5\' 4"  (1.626 m)   Wt 165 lb (74.8 kg)   SpO2 98%   BMI 28.32 kg/m   BP Readings from Last 3 Encounters:  01/01/22 (!) 154/80  11/26/21 (!) 141/55  08/13/21 130/80    Wt Readings from Last 3 Encounters:  01/01/22 165 lb (74.8 kg)  11/26/21 162 lb  14.7 oz (73.9 kg)  11/21/21 163 lb (73.9 kg)    Physical Exam Vitals reviewed.  Constitutional:      Appearance: Normal appearance.  HENT:     Nose: Nose normal.     Mouth/Throat:     Mouth: Mucous membranes are moist.  Eyes:     General: No scleral icterus.    Conjunctiva/sclera: Conjunctivae normal.  Cardiovascular:     Rate and Rhythm: Normal rate and regular rhythm.     Heart sounds: No murmur heard. Pulmonary:     Effort: Pulmonary effort is normal.     Breath sounds: No stridor. No wheezing, rhonchi or rales.  Abdominal:     General: Abdomen is flat.     Palpations: There is no mass.     Tenderness: There is no abdominal tenderness. There is no guarding.     Hernia: No hernia is present.  Musculoskeletal:        General: No swelling.     Cervical back: Neck supple.     Right lower leg: No edema.     Left lower leg: No edema.  Lymphadenopathy:     Cervical: No cervical adenopathy.  Skin:    General: Skin is warm and dry.  Neurological:     General: No focal deficit present.     Mental Status: She is alert.  Psychiatric:        Mood and Affect: Mood normal.        Behavior: Behavior normal.    Lab Results  Component Value Date   WBC 7.8 01/01/2022   HGB 12.7 01/01/2022   HCT 37.7 01/01/2022   PLT 177.0 01/01/2022   GLUCOSE 121 (H) 01/01/2022   CHOL 179 01/01/2022   TRIG 68.0 01/01/2022   HDL 101.40 01/01/2022   LDLDIRECT 104.5 04/25/2010   LDLCALC 64 01/01/2022   ALT 93 (H) 01/01/2022   AST 52 (H) 01/01/2022   NA 140 01/01/2022   K 3.9 01/01/2022   CL 104 01/01/2022   CREATININE 0.98 01/01/2022   BUN 19 01/01/2022   CO2 29 01/01/2022   TSH 3.60 01/01/2022   INR 0.92 06/05/2014   HGBA1C 6.4 01/01/2022   MICROALBUR 6.7 (H) 01/01/2022    No results found.  Assessment & Plan:   Briana [name] was seen today for annual exam.  Diagnoses and all orders for this visit:  Type II diabetes mellitus with manifestations (HCC)- Her blood sugar is well  controlled. -     HM Diabetes Foot Exam -     Basic metabolic panel; Future -     Hemoglobin A1c; Future -     Microalbumin / creatinine urine ratio; Future -     Microalbumin / creatinine urine ratio -     Hemoglobin A1c -     Basic metabolic panel  Essential hypertension, benign- Her blood pressure is well controlled. -     amLODipine (NORVASC) 10 MG tablet; Take 1 tablet (10 mg total) by mouth daily. -     atenolol (TENORMIN) 100 MG tablet; Take 1 tablet (100 mg total) by mouth daily. -     Basic metabolic panel; Future -     TSH; Future -     Hepatic function panel; Future -     CBC with Differential/Platelet; Future -     CBC with Differential/Platelet -     Hepatic function panel -     TSH -     Basic metabolic panel  Hyperlipidemia with target LDL less than 130- Her LFT.s are very mildly elevated but I think it's safe to continue the statin.  -     atorvastatin (LIPITOR) 10 MG tablet; Take 1 tablet (10 mg total) by mouth at bedtime. -     Lipid panel; Future -     Hepatic function panel; Future -     Hepatic function panel -     Lipid panel  Primary osteoarthritis of both knees -     traMADol (ULTRAM) 50 MG tablet; Take 1 tablet (50 mg total) by mouth every 12 (twelve) hours as needed for moderate pain.  Rheumatoid arthritis involving both shoulders with positive rheumatoid factor (HCC) -     traMADol (ULTRAM) 50 MG tablet; Take 1 tablet (50 mg total) by mouth every 12 (twelve) hours as needed for moderate pain.  Stage 3b chronic kidney disease (HCC)- Her renal function is stable. -     Basic metabolic panel; Future -     Urinalysis, Routine w reflex microscopic; Future -     Microalbumin / creatinine urine ratio; Future -     CBC with Differential/Platelet; Future -     CBC with Differential/Platelet -     Microalbumin / creatinine urine ratio -     Urinalysis, Routine w reflex microscopic -     Basic metabolic panel  Routine health maintenance- Exam completed,  labs reviewed, vaccines reviewed and updated, no cancer screenings indicated, patient education was given.   I have changed Briana [name]'s amLODipine, atorvastatin, and atenolol. I am also having her start on Shingrix. Additionally, I am having her maintain her folic acid, Vitamin D, fexofenadine, b complex vitamins, acetaminophen, hydroxychloroquine, dorzolamide-timolol, Rhopressa, latanoprost, lidocaine, ondansetron, meclizine, methotrexate, predniSONE, and traMADol.  Meds ordered this encounter  Medications   amLODipine (NORVASC) 10 MG tablet    Sig: Take 1 tablet (10 mg total) by mouth daily.    Dispense:  90 tablet    Refill:  1   atorvastatin (LIPITOR) 10 MG tablet    Sig: Take 1 tablet (10 mg total) by mouth at bedtime.    Dispense:  90 tablet    Refill:  1   traMADol (ULTRAM) 50 MG tablet    Sig: Take 1 tablet (50 mg total) by mouth every 12 (twelve) hours as needed for moderate pain.    Dispense:  180 tablet    Refill:  1   atenolol (TENORMIN) 100 MG tablet    Sig: Take 1 tablet (100 mg total) by mouth daily.    Dispense:  90 tablet    Refill:  1   Zoster Vaccine Adjuvanted [facility]) injection    Sig: Inject 0.5 mLs into the muscle once for 1 dose.    Dispense:  0.5 mL    Refill:  1  Follow-up: Return in about 6 months (around 07/04/2022).  Sanda Linger, MD

## 2022-01-04 MED ORDER — SHINGRIX 50 MCG/0.5ML IM SUSR: 0.5000 mL | Freq: Once | INTRAMUSCULAR | 1 refills | Status: AC

## 2022-01-06 ENCOUNTER — Telehealth: Payer: Self-pay | Admitting: Internal Medicine

## 2022-01-06 NOTE — Telephone Encounter (Signed)
Called pt, LVM.   

## 2022-01-06 NOTE — Telephone Encounter (Signed)
Pt checking status of 01-01-2022 lab results ? ?Informed pt of providers 01-01-2022 result notes ? ?Pt has additional question regarding  liver function ? ?Pt requesting a cb ? ?

## 2022-01-10 ENCOUNTER — Other Ambulatory Visit: Payer: Self-pay | Admitting: Internal Medicine

## 2022-01-10 DIAGNOSIS — E785 Hyperlipidemia, unspecified: Secondary | ICD-10-CM

## 2022-02-24 ENCOUNTER — Ambulatory Visit (INDEPENDENT_AMBULATORY_CARE_PROVIDER_SITE_OTHER): Payer: Medicare HMO | Admitting: Family Medicine

## 2022-02-24 ENCOUNTER — Encounter: Payer: Self-pay | Admitting: Family Medicine

## 2022-02-24 VITALS — BP 138/60 | HR 55 | Temp 97.8°F | Ht 64.0 in | Wt 162.0 lb

## 2022-02-24 DIAGNOSIS — L309 Dermatitis, unspecified: Secondary | ICD-10-CM | POA: Diagnosis not present

## 2022-02-24 MED ORDER — TRIAMCINOLONE ACETONIDE 0.1 % EX CREA: 1.0000 "application " | TOPICAL_CREAM | Freq: Two times a day (BID) | CUTANEOUS | 0 refills | Status: DC

## 2022-02-24 NOTE — Patient Instructions (Signed)
Use the steroid cream (triamcinolone) twice daily for the next 1 to 2 weeks.  Stop using it after that. ? ?I recommend that you get over-the-counter Zyrtec and you can take this in the evening. ? ?Use cool compresses to help control itching also. ? ?Follow-up if the area is getting worse or not improving after 2 weeks. ? ? ?Contact Dermatitis ?Dermatitis is redness, soreness, and swelling (inflammation) of the skin. Contact dermatitis is a reaction to something that touches the skin. ?There are two types of contact dermatitis: ?Irritant contact dermatitis. This happens when something bothers (irritates) your skin, like soap. ?Allergic contact dermatitis. This is caused when you are exposed to something that you are allergic to, such as poison ivy. ?What are the causes? ?Common causes of irritant contact dermatitis include: ?Makeup. ?Soaps. ?Detergents. ?Bleaches. ?Acids. ?Metals, such as nickel. ?Common causes of allergic contact dermatitis include: ?Plants. ?Chemicals. ?Jewelry. ?Latex. ?Medicines. ?Preservatives in products, such as clothing. ?What increases the risk? ?Having a job that exposes you to things that bother your skin. ?Having asthma or eczema. ?What are the signs or symptoms? ?Symptoms may happen anywhere the irritant has touched your skin. Symptoms include: ?Dry or flaky skin. ?Redness. ?Cracks. ?Itching. ?Pain or a burning feeling. ?Blisters. ?Blood or clear fluid draining from skin cracks. ?With allergic contact dermatitis, swelling may occur. This may happen in places such as the eyelids, mouth, or genitals. ?How is this treated? ?This condition is treated by checking for the cause of the reaction and protecting your skin. Treatment may also include: ?Steroid creams, ointments, or medicines. ?Antibiotic medicines or other ointments, if you have a skin infection. ?Lotion or medicines to help with itching. ?A bandage (dressing). ?Follow these instructions at home: ?Skin care ?Moisturize your skin as  needed. ?Put cool cloths on your skin. ?Put a baking soda paste on your skin. Stir water into baking soda until it looks like a paste. ?Do not scratch your skin. ?Avoid having things rub up against your skin. ?Avoid the use of soaps, perfumes, and dyes. ?Medicines ?Take or apply over-the-counter and prescription medicines only as told by your doctor. ?If you were prescribed an antibiotic medicine, take or apply it as told by your doctor. Do not stop using it even if your condition starts to get better. ?Bathing ?Take a bath with: ?Epsom salts. ?Baking soda. ?Colloidal oatmeal. ?Bathe less often. ?Bathe in warm water. Avoid using hot water. ?Bandage care ?If you were given a bandage, change it as told by your doctor. ?Wash your hands with soap and water before and after you change your bandage. If soap and water are not available, use hand sanitizer. ?General instructions ?Avoid the things that caused your reaction. If you do not know what caused it, keep a journal. Write down: ?What you eat. ?What skin products you use. ?What you drink. ?What you wear in the area that has symptoms. This includes jewelry. ?Check the affected areas every day for signs of infection. Check for: ?More redness, swelling, or pain. ?More fluid or blood. ?Warmth. ?Pus or a bad smell. ?Keep all follow-up visits as told by your doctor. This is important. ?Contact a doctor if: ?You do not get better with treatment. ?Your condition gets worse. ?You have signs of infection, such as: ?More swelling. ?Tenderness. ?More redness. ?Soreness. ?Warmth. ?You have a fever. ?You have new symptoms. ?Get help right away if: ?You have a very bad headache. ?You have neck pain. ?Your neck is stiff. ?You throw up (vomit). ?You feel  very sleepy. ?You see red streaks coming from the area. ?Your bone or joint near the area hurts after the skin has healed. ?The area turns darker. ?You have trouble breathing. ?Summary ?Dermatitis is redness, soreness, and swelling of  the skin. ?Symptoms may occur where the irritant has touched you. ?Treatment may include medicines and skin care. ?If you do not know what caused your reaction, keep a journal. ?Contact a doctor if your condition gets worse or you have signs of infection. ?This information is not intended to replace advice given to you by your health care provider. Make sure you discuss any questions you have with your health care provider. ?Document Revised: 07/14/2021 Document Reviewed: 07/14/2021 ?Elsevier Patient Education ? [place]. ? ?

## 2022-02-24 NOTE — Progress Notes (Signed)
? ?Subjective:  ? ? ? Patient ID: Briana [name], female    DOB: [birth year], 89 y.o.   MRN: [id number] ? ?Chief Complaint  ?Patient presents with  ? Rash  ?  Rash on neck for about 5 days. Has been trying OTC creams but does not seem to help, denies any new use of soaps/detergent.  ? ? ?HPI ?Patient is in today for a 5 day history of a pruritic rash. No known explanation. No new soaps, lotions, detergents, hair products. She may have worn a necklace which was out of her norm but cannot recall how long ago.  ? ?She is using calamine lotion.  ? ?No fever, chills, dizziness, chest pain, shortness of breath, cough, abdominal pain, N/V/D.  ? ?Health Maintenance Due  ?Topic Date Due  ? Zoster Vaccines- Shingrix (1 of 2) Never done  ? ? ?Past Medical History:  ?Diagnosis Date  ? ACUTE URIS OF UNSPECIFIED SITE 11/26/2008  ? ALLERGIC ARTHRITIS OTHER SPECIFIED SITES 10/22/2010  ? OA + RA  ? ALLERGIC RHINITIS 10/28/2007  ? ARTHRITIS, KNEES, BILATERAL 10/28/2007  ? BACK PAIN 04/12/2009  ? BUNIONECTOMY, HX OF 10/28/2007  ? CELLULITIS&ABSCESS OF HAND EXCEPT FINGERS&THUMB 10/23/2010  ? Complication of anesthesia   ? itching and makes me crazy  ? Family history of anesthesia complication   ? DAUGHTER HAD HIVES AND " WENT WILD "  ? HYPERLIPIDEMIA 10/28/2007  ? HYPERTENSION 10/28/2007  ? INSOMNIA UNSPECIFIED 10/31/2007  ? MYOSITIS 10/22/2010  ? NECK PAIN 12/03/2008  ? Pain in joint, site unspecified 10/07/2010  ? WRIST PAIN, LEFT 10/22/2010  ? ? ?Past Surgical History:  ?Procedure Laterality Date  ? arthroscopic surgery and rotator cuff repair left shoulder    ? arthroscopy right knee hx of surgery    ? BUNIONECTOMY    ? hx of  ? FOOT SURGERY    ? RIGHT  ? hand surgery for broken finger, remote    ? JOINT REPLACEMENT Right   ? knee  ? TOTAL KNEE ARTHROPLASTY  07/01/2012  ? Procedure: TOTAL KNEE ARTHROPLASTY;  Surgeon: Yvette Rack., MD;  Location: [facility];  Service: Orthopedics;  Laterality: Right;  right total knee arthroplasty  ? TOTAL KNEE  ARTHROPLASTY Left 06/13/2014  ? dr Percell Miller  ? TOTAL KNEE ARTHROPLASTY Left 06/13/2014  ? Procedure: TOTAL KNEE ARTHROPLASTY;  Surgeon: Ninetta Lights, MD;  Location: [facility];  Service: Orthopedics;  Laterality: Left;  ? ? ?Family History  ?Problem Relation Age of Onset  ? Cancer Brother   ?     lung cancer  ? Coronary artery disease Other   ? Heart attack Other   ? Cancer Sister   ?     Lung, stomach  ? ? ?Social History  ? ?Socioeconomic History  ? Marital status: Widowed  ?  Spouse name: Not on file  ? Number of children: 4  ? Years of education: 33  ? Highest education level: Not on file  ?Occupational History  ? Occupation: ASSN II 1  ?  Employer: [employer]  ?Tobacco Use  ? Smoking status: Never  ? Smokeless tobacco: Never  ?Vaping Use  ? Vaping Use: Never used  ?Substance and Sexual Activity  ? Alcohol use: No  ? Drug use: No  ? Sexual activity: Not Currently  ?Other Topics Concern  ? Not on file  ?Social History Narrative  ? Married in 1968 widowed on 00  ? 4 sons, 1 daughter, 6 grandchildren, 3 great-grands  ?  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 Lives in her own home, one son lives with her.  ? ?Social Determinants of Health  ? ?Financial Resource Strain: Low Risk   ? Difficulty of Paying Living Expenses: Not very hard  ?Food Insecurity: No Food Insecurity  ? Worried About Charity fundraiser in the Last Year: Never true  ? Ran Out of Food in the Last Year: Never true  ?Transportation Needs: No Transportation Needs  ? Lack of Transportation (Medical): No  ? Lack of Transportation (Non-Medical): No  ?Physical Activity: Insufficiently Active  ? Days of Exercise per Week: 3 days  ? Minutes of Exercise per Session: 30 min  ?Stress: No Stress Concern Present  ? Feeling of Stress : Not at all  ?Social Connections: Moderately Integrated  ? Frequency of Communication with Friends and Family: More than three times a week  ? Frequency of Social Gatherings with Friends and Family: More than three  times a week  ? Attends Religious Services: More than 4 times per year  ? Active Member of Clubs or Organizations: Yes  ? Attends Archivist Meetings: More than 4 times per year  ? Marital Status: Widowed  ?Intimate Partner Violence: Not At Risk  ? Fear of Current or Ex-Partner: No  ? Emotionally Abused: No  ? Physically Abused: No  ? Sexually Abused: No  ? ? ?Outpatient Medications Prior to Visit  ?Medication Sig Dispense Refill  ? acetaminophen (TYLENOL) 650 MG CR tablet Take 650 mg by mouth every 8 (eight) hours as needed for pain.    ? amLODipine (NORVASC) 10 MG tablet Take 1 tablet (10 mg total) by mouth daily. 90 tablet 1  ? atenolol (TENORMIN) 100 MG tablet Take 1 tablet (100 mg total) by mouth daily. 90 tablet 1  ? atorvastatin (LIPITOR) 10 MG tablet TAKE 1 TABLET EVERY DAY 90 tablet 1  ? b complex vitamins tablet Take 1 tablet by mouth daily.    ? Cholecalciferol (VITAMIN D) 2000 UNITS tablet Take 2,000 Units by mouth daily.    ? dorzolamide-timolol (COSOPT) 22.3-6.8 MG/ML ophthalmic solution Place 1 drop into both eyes 2 (two) times daily.    ? fexofenadine (ALLEGRA) 180 MG tablet Take 180 mg by mouth daily as needed for allergies.    ? hydroxychloroquine (PLAQUENIL) 200 MG tablet Take 200 mg by mouth 2 (two) times daily.    ? latanoprost (XALATAN) 0.005 % ophthalmic solution Place 1 drop into both eyes at bedtime.    ? meclizine (ANTIVERT) 25 MG tablet TAKE 1 TABLET THREE TIMES DAILY AS NEEDED FOR DIZZINESS. 90 tablet 3  ? methotrexate 2.5 MG tablet TAKE 6 TABLETS ONE TIME WEEKLY    ? Netarsudil Dimesylate (RHOPRESSA) 0.02 % SOLN Place 1 drop into the right eye at bedtime.    ? traMADol (ULTRAM) 50 MG tablet Take 1 tablet (50 mg total) by mouth every 12 (twelve) hours as needed for moderate pain. 180 tablet 1  ? folic acid (FOLVITE) 1 MG tablet Take 1 mg by mouth daily. (Patient not taking: Reported on 02/24/2022)    ? lidocaine (LIDODERM) 5 % Place 1 patch onto the skin daily. Remove & Discard  patch within 12 hours or as directed by MD (Patient not taking: Reported on 02/24/2022) 10 patch 0  ? ondansetron (ZOFRAN ODT) 4 MG disintegrating tablet Take 1 tablet (4 mg total) by mouth every 8 (eight) hours as needed for nausea or vomiting. (Patient not  taking: Reported on 02/24/2022) 20 tablet 0  ? predniSONE (STERAPRED UNI-PAK 21 TAB) 10 MG (21) TBPK tablet '10mg'$  Tabs, 6 day taper. Use as directed 1 each 0  ? ?No facility-administered medications prior to visit.  ? ? ?Allergies  ?Allergen Reactions  ? Ibuprofen Anaphylaxis  ? Percocet [Oxycodone-Acetaminophen]   ?  nausea  ? Simvastatin Other (See Comments)  ?  muscle aches  ? 2,4-D Dimethylamine Other (See Comments)  ? Diltiazem Hcl   ?  REACTION: ANGIO EDEMA  ? Hydrochlorothiazide   ?  REACTION: SWELLING  ? Lisinopril   ?  REACTION: SWELLING  ? Valacyclovir Other (See Comments)  ? Verapamil   ?  REACTION: SWELLING  ? ? ?ROS ? ?   ?Objective:  ?  ?Physical Exam ? ?BP 138/60 (BP Location: Left Arm, Patient Position: Sitting, Cuff Size: Large)   Pulse (!) 55   Temp 97.8 ?F (36.6 ?C) (Temporal)   Ht '5\' 4"'$  (1.626 m)   Wt 162 lb (73.5 kg)   SpO2 98%   BMI 27.81 kg/m?  ?Wt Readings from Last 3 Encounters:  ?02/24/22 162 lb (73.5 kg)  ?01/01/22 165 lb (74.8 kg)  ?11/26/21 162 lb 14.7 oz (73.9 kg)  ? ?Alert and oriented and in no acute distress. Raised, pruritic maculopapular rash, somewhat hyperpigmented, along her anterior and posterior neck line. No erythema, induration or drainage.  ?   ?Assessment & Plan:  ? ?Problem List Items Addressed This Visit   ?None ?Visit Diagnoses   ? ? Dermatitis    -  Primary  ? Relevant Medications  ? triamcinolone cream (KENALOG) 0.1 %  ? ?  ? ?Discussed that her rash is most likely due to a contact dermatitis.  Triamcinolone prescribed.  She may take Zyrtec for itching, avoid more sedating medication such as Benadryl.  Also recommend cool compresses.  Look for possible triggers.  Follow-up if worsening.  ? ?I have discontinued  Tacey M. Folsom's predniSONE. I am also having her start on triamcinolone cream. Additionally, I am having her maintain her folic acid, Vitamin D, fexofenadine, b complex vitamins, acetaminophen, hydroxychloro

## 2022-03-17 ENCOUNTER — Telehealth: Payer: Medicare HMO

## 2022-03-20 ENCOUNTER — Ambulatory Visit (INDEPENDENT_AMBULATORY_CARE_PROVIDER_SITE_OTHER): Payer: Medicare HMO

## 2022-03-20 DIAGNOSIS — N1832 Chronic kidney disease, stage 3b: Secondary | ICD-10-CM

## 2022-03-20 DIAGNOSIS — I10 Essential (primary) hypertension: Secondary | ICD-10-CM

## 2022-03-20 DIAGNOSIS — E785 Hyperlipidemia, unspecified: Secondary | ICD-10-CM

## 2022-03-20 DIAGNOSIS — M05711 Rheumatoid arthritis with rheumatoid factor of right shoulder without organ or systems involvement: Secondary | ICD-10-CM

## 2022-03-20 NOTE — Progress Notes (Signed)
Chronic Care Management Pharmacy Note  03/20/2022 Name:  Briana [name] MRN:  [id number] DOB:  July 23, 1931  Summary: -Pt reports that since last appointment rheumatology has stopped mtx and folic acid, has follow up in 6 months to reassess  -Notes that she has purchased a new BP cuff, has not started to use yet -Received both doses of shingrix vaccine since last appointment   Recommendations/Changes made from today's visit: -Recommending no changes to medications, advised for patient to start monitoring BP at least 2-3 times weekly, to reach out to office should BP average >140/90 -F/u in 6 months    Subjective: Briana [name] is an 88 y.o. year old female who is a primary patient of Janith Lima, MD.  The CCM team was consulted for assistance with disease management and care coordination needs.     Engaged with patient by telephone for follow up visit in response to provider referral for pharmacy case management and/or care coordination services.   Consent to Services:  The patient was given information about Chronic Care Management services, agreed to services, and gave verbal consent prior to initiation of services.  Please see initial visit note for detailed documentation.   Patient Care Team: Janith Lima, MD as PCP - General (Internal Medicine) Gavin Pound, MD as Consulting Physician (Rheumatology) Clent Jacks, MD as Consulting Physician (Ophthalmology) Gatha Mayer, MD as Consulting Physician (Gastroenterology) Charlton Haws, [facility] as Pharmacist (Pharmacist)  Recent office visits: 02/24/2022 - Harland Dingwall PA-C - dermatitis - triamcinolone cream rx'd  01/01/2022 - Dr. Ronnald Ramp - no changes to medications - f/u in 6 months  11/21/2021 - Dr. Ronnald Ramp - no changes to medications  08/13/2022 - Dr. Sharlet Salina - hospital follow up - ondansetron for nausea 08/04/2021 - Dr. Sharlet Salina - urgent care follow up - shingles outbreak - rx'd lidocaine 5% patches,  gabapentin  Recent consult visits: 09/29/2021 - Dr. Katy Fitch - Ophthalmology - note not available   Hospital visits: 11/26/2021 - ED visit - facial swelling - medrol, epinephrine, diphenhydramine, and famotidine given IV - discharged with prednisone taper   08/07/2021-08/10/2021 [facility] admission for acute renal failure - stop gabapentin, valacyclovir, and mtx - restart mtx once kidney function has stabilized     Objective:  Lab Results  Component Value Date   CREATININE 0.98 01/01/2022   BUN 19 01/01/2022   GFR 51.59 (L) 01/01/2022   GFRNONAA 32 (L) 08/10/2021   GFRAA 63 05/29/2020   NA 140 01/01/2022   K 3.9 01/01/2022   CALCIUM 9.2 01/01/2022   CO2 29 01/01/2022   GLUCOSE 121 (H) 01/01/2022    Lab Results  Component Value Date/Time   HGBA1C 6.4 01/01/2022 09:22 AM   HGBA1C 6.2 (H) 08/08/2021 08:00 AM   GFR 51.59 (L) 01/01/2022 09:22 AM   GFR 38.27 (L) 08/13/2021 11:15 AM   MICROALBUR 6.7 (H) 01/01/2022 09:22 AM   MICROALBUR 6.3 (H) 12/30/2020 10:31 AM    Last diabetic Eye exam:  Lab Results  Component Value Date/Time   HMDIABEYEEXA No Retinopathy 11/13/2021 12:00 AM    Last diabetic Foot exam: No results found for: "HMDIABFOOTEX"   Lab Results  Component Value Date   CHOL 179 01/01/2022   HDL 101.40 01/01/2022   LDLCALC 64 01/01/2022   LDLDIRECT 104.5 04/25/2010   TRIG 68.0 01/01/2022   CHOLHDL 2 01/01/2022       Latest Ref Rng & Units 01/01/2022    9:22 AM 08/08/2021    7:01 AM  08/07/2021    4:58 PM  Hepatic Function  Total Protein 6.0 - 8.3 g/dL 7.1  7.4  7.1   Albumin 3.5 - 5.2 g/dL 4.1  3.7  3.7   AST 0 - 37 U/L 52  40  47   ALT 0 - 35 U/L 93  39  30   Alk Phosphatase 39 - 117 U/L 105  86  89   Total Bilirubin 0.2 - 1.2 mg/dL 0.7  0.7  1.1   Bilirubin, Direct 0.0 - 0.3 mg/dL 0.2       Lab Results  Component Value Date/Time   TSH 3.60 01/01/2022 09:22 AM   TSH 1.84 12/30/2020 10:31 AM       Latest Ref Rng & Units 01/01/2022    9:22 AM  08/08/2021    8:00 AM 08/07/2021    4:58 PM  CBC  WBC 4.0 - 10.5 K/uL 7.8  11.7  11.9   Hemoglobin 12.0 - 15.0 g/dL 12.7  13.9  13.3   Hematocrit 36.0 - 46.0 % 37.7  39.6  39.0   Platelets 150.0 - 400.0 K/uL 177.0  161  168     No results found for: "VD25OH"  Clinical ASCVD: No  The ASCVD Risk score (Arnett DK, et al., 2019) failed to calculate for the following reasons:   The 2019 ASCVD risk score is only valid for ages 63 to 17       11/21/2021    1:15 PM 07/11/2020   11:00 AM 06/20/2019    2:02 PM  Depression screen PHQ 2/9  Decreased Interest 0 0 0  Down, Depressed, Hopeless 0 0 0  PHQ - 2 Score 0 0 0      Social History   Tobacco Use  Smoking Status Never  Smokeless Tobacco Never   BP Readings from Last 3 Encounters:  02/24/22 138/60  01/01/22 (!) 154/80  11/26/21 (!) 141/55   Pulse Readings from Last 3 Encounters:  02/24/22 (!) 55  01/01/22 (!) 56  11/26/21 64   Wt Readings from Last 3 Encounters:  02/24/22 162 lb (73.5 kg)  01/01/22 165 lb (74.8 kg)  11/26/21 162 lb 14.7 oz (73.9 kg)   BMI Readings from Last 3 Encounters:  02/24/22 27.81 kg/m  01/01/22 28.32 kg/m  11/26/21 27.97 kg/m    Assessment/Interventions: Review of patient past medical history, allergies, medications, health status, including review of consultants reports, laboratory and other test data, was performed as part of comprehensive evaluation and provision of chronic care management services.   SDOH:  (Social Determinants of Health) assessments and interventions performed: Yes   SDOH Screenings   Alcohol Screen: Low Risk  (11/21/2021)   Alcohol Screen    Last Alcohol Screening Score (AUDIT): 0  Depression (PHQ2-9): Low Risk  (11/21/2021)   Depression (PHQ2-9)    PHQ-2 Score: 0  Financial Resource Strain: Low Risk  (11/21/2021)   Overall Financial Resource Strain (CARDIA)    Difficulty of Paying Living Expenses: Not very hard  Food Insecurity: No Food Insecurity (11/21/2021)    Hunger Vital Sign    Worried About Running Out of Food in the Last Year: Never true    Ran Out of Food in the Last Year: Never true  Housing: Low Risk  (11/21/2021)   Housing    Last Housing Risk Score: 0  Physical Activity: Insufficiently Active (11/21/2021)   Exercise Vital Sign    Days of Exercise per Week: 3 days    Minutes of Exercise  per Session: 30 min  Social Connections: Moderately Integrated (11/21/2021)   Social Connection and Isolation Panel [NHANES]    Frequency of Communication with Friends and Family: More than three times a week    Frequency of Social Gatherings with Friends and Family: More than three times a week    Attends Religious Services: More than 4 times per year    Active Member of [employer] or Organizations: Yes    Attends Archivist Meetings: More than 4 times per year    Marital Status: Widowed  Stress: No Stress Concern Present (11/21/2021)   [place]    Feeling of Stress : Not at all  Tobacco Use: Low Risk  (02/24/2022)   Patient History    Smoking Tobacco Use: Never    Smokeless Tobacco Use: Never    Passive Exposure: Not on file  Transportation Needs: No Transportation Needs (11/21/2021)   PRAPARE - Transportation    Lack of Transportation (Medical): No    Lack of Transportation (Non-Medical): No     CCM Care Plan  Allergies  Allergen Reactions   Ibuprofen Anaphylaxis   Percocet [Oxycodone-Acetaminophen]     nausea   Simvastatin Other (See Comments)    muscle aches   2,4-D Dimethylamine Other (See Comments)   Diltiazem Hcl     REACTION: ANGIO EDEMA   Hydrochlorothiazide     REACTION: SWELLING   Lisinopril     REACTION: SWELLING   Valacyclovir Other (See Comments)   Verapamil     REACTION: SWELLING    Medications Reviewed Today     Reviewed by Tomasa Blase, [facility] (Pharmacist) on 03/20/22 at 1004  Med List Status: <None>   Medication Order Taking? Sig  Documenting Provider Last Dose Status Informant  acetaminophen (TYLENOL) 650 MG CR tablet [id number] Yes Take 650 mg by mouth every 8 (eight) hours as needed for pain. [provider] Taking Active Self  amLODipine (NORVASC) 10 MG tablet [id number] Yes Take 1 tablet (10 mg total) by mouth daily. Janith Lima, MD Taking Active   atenolol (TENORMIN) 100 MG tablet [id number] Yes Take 1 tablet (100 mg total) by mouth daily. Janith Lima, MD Taking Active   atorvastatin (LIPITOR) 10 MG tablet [id number] Yes TAKE 1 TABLET EVERY DAY Janith Lima, MD Taking Active   b complex vitamins tablet [id number] Yes Take 1 tablet by mouth daily. [provider] Taking Active Self  Cholecalciferol (VITAMIN D) 2000 UNITS tablet [id number] Yes Take 2,000 Units by mouth daily. [provider] Taking Active Self  dorzolamide-timolol (COSOPT) 22.3-6.8 MG/ML ophthalmic solution [id number] Yes Place 1 drop into both eyes 2 (two) times daily. [provider] Taking Active Self  fexofenadine (ALLEGRA) 180 MG tablet [id number] Yes Take 180 mg by mouth daily as needed for allergies. [provider] Taking Active Self  folic acid (FOLVITE) 1 MG tablet [id number] No Take 1 mg by mouth daily.  Patient not taking: Reported on 02/24/2022   [provider] Not Taking Active Self  hydroxychloroquine (PLAQUENIL) 200 MG tablet [id number] Yes Take 200 mg by mouth 2 (two) times daily. Gavin Pound, MD Taking Active Self  latanoprost (XALATAN) 0.005 % ophthalmic solution [id number] Yes Place 1 drop into both eyes at bedtime. [provider] Taking Active Self  lidocaine (LIDODERM) 5 % [id number] No Place 1 patch onto the skin daily. Remove & Discard patch within 12 hours or as directed by MD  Patient not taking:  Reported on 02/24/2022   Jonetta Osgood, MD Not Taking Active   meclizine (ANTIVERT) 25 MG tablet [id number] Yes TAKE 1 TABLET THREE TIMES DAILY AS NEEDED FOR DIZZINESS.  Janith Lima, MD Taking Active   methotrexate 2.5 MG tablet [id number] No TAKE 6 TABLETS ONE TIME WEEKLY  Patient not taking: Reported on 03/20/2022   [provider] Not Taking Active   Netarsudil Dimesylate (RHOPRESSA) 0.02 % SOLN [id number] Yes Place 1 drop into the right eye at bedtime. [provider] Taking Active Self  traMADol (ULTRAM) 50 MG tablet [id number] Yes Take 1 tablet (50 mg total) by mouth every 12 (twelve) hours as needed for moderate pain. Janith Lima, MD Taking Active             Patient Active Problem List   Diagnosis Date Noted   Body mass index (BMI) 31.0-31.9, adult 11/21/2021   Chronic fatigue syndrome 11/21/2021   Other long term (current) drug therapy 11/21/2021   Sciatica 11/21/2021   Stage 3b chronic kidney disease ([place]) 05/30/2020   Type II diabetes mellitus with manifestations ([place]) 12/28/2019   Vertigo, benign paroxysmal, bilateral 08/15/2019   Left lumbar radiculitis 03/07/2017   Essential hypertension, benign 06/19/2014   RA (rheumatoid arthritis) ([place]) 06/19/2014   Constipation due to opioid therapy 06/19/2014   Routine health maintenance 05/30/2012   INSOMNIA UNSPECIFIED 10/31/2007   Hyperlipidemia with target LDL less than 130 10/28/2007   Seasonal allergic rhinitis 10/28/2007   DJD (degenerative joint disease) of knee 10/28/2007    Immunization History  Administered Date(s) Administered   Influenza Split 07/03/2012   Influenza, High Dose Seasonal PF 07/24/2013, 07/15/2018, 07/15/2020   Influenza,inj,Quad PF,6+ Mos 08/06/2014   Influenza-Unspecified 07/12/2017, 07/18/2019, 07/15/2021   Moderna Sars-Covid-2 Vaccination 10/24/2019, 11/21/2019   Pneumococcal Conjugate-13 10/23/2015   Pneumococcal Polysaccharide-23 04/19/2014, 12/30/2020   Td 05/30/2012   Zoster Recombinat (Shingrix) 10/21/2021, 12/23/2021   Zoster, Live 07/13/2015    Conditions to be addressed/monitored:  Hypertension, Hyperlipidemia, Diabetes,  Chronic Kidney Disease and Rheumatoid arthritis  Care Plan : [place]  Updates made by Tomasa Blase, [employer] since 03/20/2022 12:00 AM     Problem: Hypertension, Hyperlipidemia, Diabetes, Chronic Kidney Disease and Rheumatoid arthritis   Priority: High     Long-Range Goal: Disease management   Start Date: 01/02/2021  Expected End Date: 03/21/2023  This Visit's Progress: On track  Recent Progress: On track  Priority: High  Note:   Current Barriers:  Unable to independently monitor therapeutic efficacy  Pharmacist Clinical Goal(s):  Patient will achieve adherence to monitoring guidelines and medication adherence to achieve therapeutic efficacy through collaboration with PharmD and provider.   Interventions: 1:1 collaboration with Janith Lima, MD regarding development and update of comprehensive plan of care as evidenced by provider attestation and co-signature Inter-disciplinary care team collaboration (see longitudinal plan of care) Comprehensive medication review performed; medication list updated in electronic medical record  Hypertension / CKD Stage 3a (BP goal <140/90) -Not ideally controlled - recent BP in office was above goal, pt has not been able to check at home, has purchased a new BP cuff, but has not started using yet  -Current home readings: unknown -Current treatment: Amlodipine 10 mg daily AM Atenolol 100 mg daily AM -Educated on Importance of home blood pressure monitoring; Proper BP monitoring technique; -Counseled to monitor BP at home 3 times a week -Recommended to continue current medication  Hyperlipidemia: (LDL goal < 100) -Controlled - LDL at goal; pt  endorses compliance with statin Lab Results  Component Value Date   LDLCALC 64 01/01/2022  -Current treatment: Atorvastatin 10 mg daily HS -Educated on Cholesterol goals; Benefits of statin for ASCVD risk reduction; -Recommended to continue current medication  Rheumatoid arthritis (Goal:  manage pain) -Controlled - patient sees rheumatologist Dr Trudie Reed every 3 months, with last appointment mtx and folic acid stopped  -Current treatment  Methotrexate 2.5 mg - take 15 mg (6 tab) once a week - on hold at this time Hydroxychloroquine 431 mg BID Folic acid 1 mg daily - 3 tab daily - on hold at this time  Tramadol 50 mg q12 hr PRN Tylenol 650 mg PRN -Counseled on importance of routine monitoring/follow up visits with rheumatology Recommend to continue current medication  Glaucoma (Goal: maintain IOP at goal) -Controlled - pt sees ophthalmologist Dr Katy Fitch periodically -Current treatment  Dorzolamide-timolol eye drops Latanoprost 0.005% eye drops Rhopressa 0.02% eye drops Refresh eye drops -Recommended to continue current medication  Health Maintenance -Vaccine gaps: covid booster  Patient Goals/Self-Care Activities Patient will:  - take medications as prescribed -focus on medication adherence by pill box -check blood pressure 3 times weekly -Keep regular appts with rheumatology -Get flu vaccine     Care Gaps: COVID booster    Medication Assistance: None required.  Patient affirms current coverage meets needs.  Patient's preferred pharmacy is:  [facility], [address] [address] Phone: [phone] Fax: [phone]  [facility] [place], [address] [place] [address] Phone: [phone] Fax: [phone]   Uses pill box? Yes Pt endorses 100% compliance  Care Plan and Follow Up Patient Decision:  Patient agrees to Care Plan and Follow-up.  Plan: Telephone follow up appointment with care management team member scheduled for:  6 months  Tomasa Blase, PharmD Clinical Pharmacist, [place]

## 2022-03-20 NOTE — Patient Instructions (Signed)
Visit Information  Following are the goals we discussed today:   Track and Manage My Blood Pressure   Timeframe:  Long-Range Goal Priority:  High Start Date:       01/01/21                      Expected End Date:   03/21/2023                    Follow Up Date 09/2022   - check blood pressure 3 times per week - choose a place to take my blood pressure (home, clinic or office, retail store) - write blood pressure results in a log or diary    Why is this important?   You won't feel high blood pressure, but it can still hurt your blood vessels.  High blood pressure can cause heart or kidney problems. It can also cause a stroke.  Making lifestyle changes like losing a little weight or eating less salt will help.  Checking your blood pressure at home and at different times of the day can help to control blood pressure.  If the doctor prescribes medicine remember to take it the way the doctor ordered.  Call the office if you cannot afford the medicine or if there are questions about it.    Plan: Telephone follow up appointment with care management team member scheduled for:  6 months The patient has been provided with contact information for the care management team and has been advised to call with any health related questions or concerns.   Tomasa Blase, PharmD Clinical Pharmacist, [facility]   Please call the care guide team at [phone] if you need to cancel or reschedule your appointment.   Patient verbalizes understanding of instructions and care plan provided today and agrees to view in [place]. Active MyChart status and patient understanding of how to access instructions and care plan via MyChart confirmed with patient.

## 2022-04-08 DIAGNOSIS — H26493 Other secondary cataract, bilateral: Secondary | ICD-10-CM | POA: Diagnosis not present

## 2022-04-08 DIAGNOSIS — H04123 Dry eye syndrome of bilateral lacrimal glands: Secondary | ICD-10-CM | POA: Diagnosis not present

## 2022-04-08 DIAGNOSIS — H10413 Chronic giant papillary conjunctivitis, bilateral: Secondary | ICD-10-CM | POA: Diagnosis not present

## 2022-04-08 DIAGNOSIS — H401121 Primary open-angle glaucoma, left eye, mild stage: Secondary | ICD-10-CM | POA: Diagnosis not present

## 2022-04-08 DIAGNOSIS — H43813 Vitreous degeneration, bilateral: Secondary | ICD-10-CM | POA: Diagnosis not present

## 2022-04-08 DIAGNOSIS — H401113 Primary open-angle glaucoma, right eye, severe stage: Secondary | ICD-10-CM | POA: Diagnosis not present

## 2022-04-08 DIAGNOSIS — Z961 Presence of intraocular lens: Secondary | ICD-10-CM | POA: Diagnosis not present

## 2022-04-10 DIAGNOSIS — I129 Hypertensive chronic kidney disease with stage 1 through stage 4 chronic kidney disease, or unspecified chronic kidney disease: Secondary | ICD-10-CM

## 2022-04-10 DIAGNOSIS — M069 Rheumatoid arthritis, unspecified: Secondary | ICD-10-CM | POA: Diagnosis not present

## 2022-04-10 DIAGNOSIS — E785 Hyperlipidemia, unspecified: Secondary | ICD-10-CM

## 2022-04-10 DIAGNOSIS — H409 Unspecified glaucoma: Secondary | ICD-10-CM | POA: Diagnosis not present

## 2022-04-10 DIAGNOSIS — N1831 Chronic kidney disease, stage 3a: Secondary | ICD-10-CM | POA: Diagnosis not present

## 2022-04-30 ENCOUNTER — Ambulatory Visit (INDEPENDENT_AMBULATORY_CARE_PROVIDER_SITE_OTHER): Payer: Medicare HMO | Admitting: Internal Medicine

## 2022-04-30 ENCOUNTER — Encounter: Payer: Self-pay | Admitting: Internal Medicine

## 2022-04-30 VITALS — BP 138/78 | HR 61 | Temp 98.2°F | Ht 64.0 in | Wt 165.0 lb

## 2022-04-30 DIAGNOSIS — I10 Essential (primary) hypertension: Secondary | ICD-10-CM

## 2022-04-30 DIAGNOSIS — N1832 Chronic kidney disease, stage 3b: Secondary | ICD-10-CM

## 2022-04-30 DIAGNOSIS — E118 Type 2 diabetes mellitus with unspecified complications: Secondary | ICD-10-CM | POA: Diagnosis not present

## 2022-04-30 DIAGNOSIS — R0609 Other forms of dyspnea: Secondary | ICD-10-CM | POA: Insufficient documentation

## 2022-04-30 DIAGNOSIS — R778 Other specified abnormalities of plasma proteins: Secondary | ICD-10-CM | POA: Diagnosis not present

## 2022-04-30 DIAGNOSIS — R9431 Abnormal electrocardiogram [ECG] [EKG]: Secondary | ICD-10-CM | POA: Diagnosis not present

## 2022-04-30 DIAGNOSIS — L233 Allergic contact dermatitis due to drugs in contact with skin: Secondary | ICD-10-CM

## 2022-04-30 DIAGNOSIS — R7989 Other specified abnormal findings of blood chemistry: Secondary | ICD-10-CM | POA: Insufficient documentation

## 2022-04-30 LAB — CBC WITH DIFFERENTIAL/PLATELET
Basophils Absolute: 0.1 10*3/uL (ref 0.0–0.1)
Basophils Relative: 0.8 % (ref 0.0–3.0)
Eosinophils Absolute: 0.4 10*3/uL (ref 0.0–0.7)
Eosinophils Relative: 5.4 % — ABNORMAL HIGH (ref 0.0–5.0)
HCT: 38.1 % (ref 36.0–46.0)
Hemoglobin: 12.7 g/dL (ref 12.0–15.0)
Lymphocytes Relative: 19.8 % (ref 12.0–46.0)
Lymphs Abs: 1.4 10*3/uL (ref 0.7–4.0)
MCHC: 33.5 g/dL (ref 30.0–36.0)
MCV: 92.3 fl (ref 78.0–100.0)
Monocytes Absolute: 0.6 10*3/uL (ref 0.1–1.0)
Monocytes Relative: 9 % (ref 3.0–12.0)
Neutro Abs: 4.6 10*3/uL (ref 1.4–7.7)
Neutrophils Relative %: 65 % (ref 43.0–77.0)
Platelets: 163 10*3/uL (ref 150.0–400.0)
RBC: 4.12 Mil/uL (ref 3.87–5.11)
RDW: 14.9 % (ref 11.5–15.5)
WBC: 7.1 10*3/uL (ref 4.0–10.5)

## 2022-04-30 LAB — BASIC METABOLIC PANEL
BUN: 12 mg/dL (ref 6–23)
CO2: 31 mEq/L (ref 19–32)
Calcium: 9.6 mg/dL (ref 8.4–10.5)
Chloride: 103 mEq/L (ref 96–112)
Creatinine, Ser: 0.95 mg/dL (ref 0.40–1.20)
GFR: 53.43 mL/min — ABNORMAL LOW (ref >60.00)
Glucose, Bld: 120 mg/dL — ABNORMAL HIGH (ref 70–99)
Potassium: 3.7 mEq/L (ref 3.5–5.1)
Sodium: 141 mEq/L (ref 135–145)

## 2022-04-30 LAB — BRAIN NATRIURETIC PEPTIDE: Pro B Natriuretic peptide (BNP): 451 pg/mL — ABNORMAL HIGH (ref 0.0–100.0)

## 2022-04-30 LAB — HEMOGLOBIN A1C: Hgb A1c MFr Bld: 6.1 % (ref 4.6–6.5)

## 2022-04-30 LAB — TROPONIN I (HIGH SENSITIVITY): High Sens Troponin I: 34 ng/L (ref 2–17)

## 2022-04-30 MED ORDER — BETAMETHASONE DIPROPIONATE 0.05 % EX CREA: TOPICAL_CREAM | Freq: Two times a day (BID) | CUTANEOUS | 1 refills | Status: DC

## 2022-04-30 MED ORDER — HYDROXYZINE HCL 10 MG PO TABS: 10.0000 mg | ORAL_TABLET | Freq: Three times a day (TID) | ORAL | 0 refills | Status: DC | PRN

## 2022-04-30 MED ORDER — METHYLPREDNISOLONE 4 MG PO TBPK: ORAL_TABLET | ORAL | 0 refills | Status: AC

## 2022-04-30 NOTE — Progress Notes (Signed)
Subjective:  Patient ID: Briana [name], female    DOB: 10-18-1930  Age: 88 y.o. MRN: [id number]  CC: Rash and Hypertension   HPI  Briana [name] presents for f/up -  She complains of a 2 to 3-week history of itchy rash on her upper chest and neck.  She tells me she saw someone else and has been using a topical steroid without much improvement.  She continues to apply a mentholated ointment and calamine lotion to the area with no improvement.  She says the itching is intense.  She also complains of chronic, unchanged dyspnea on exertion.  She denies chest pain, diaphoresis, or edema.  She would like to undergo a cardiovascular work-up.  Outpatient Medications Prior to Visit  Medication Sig Dispense Refill   acetaminophen (TYLENOL) 650 MG CR tablet Take 650 mg by mouth every 8 (eight) hours as needed for pain.     amLODipine (NORVASC) 10 MG tablet Take 1 tablet (10 mg total) by mouth daily. 90 tablet 1   atenolol (TENORMIN) 100 MG tablet Take 1 tablet (100 mg total) by mouth daily. 90 tablet 1   atorvastatin (LIPITOR) 10 MG tablet TAKE 1 TABLET EVERY DAY 90 tablet 1   b complex vitamins tablet Take 1 tablet by mouth daily.     Cholecalciferol (VITAMIN D) 2000 UNITS tablet Take 2,000 Units by mouth daily.     dorzolamide-timolol (COSOPT) 22.3-6.8 MG/ML ophthalmic solution Place 1 drop into both eyes 2 (two) times daily.     folic acid (FOLVITE) 1 MG tablet Take 1 mg by mouth daily.     hydroxychloroquine (PLAQUENIL) 200 MG tablet Take 200 mg by mouth 2 (two) times daily.     latanoprost (XALATAN) 0.005 % ophthalmic solution Place 1 drop into both eyes at bedtime.     lidocaine (LIDODERM) 5 % Place 1 patch onto the skin daily. Remove & Discard patch within 12 hours or as directed by MD 10 patch 0   meclizine (ANTIVERT) 25 MG tablet TAKE 1 TABLET THREE TIMES DAILY AS NEEDED FOR DIZZINESS. 90 tablet 3   methotrexate 2.5 MG tablet      Netarsudil Dimesylate (RHOPRESSA) 0.02 % SOLN Place 1  drop into the right eye at bedtime.     traMADol (ULTRAM) 50 MG tablet Take 1 tablet (50 mg total) by mouth every 12 (twelve) hours as needed for moderate pain. 180 tablet 1   fexofenadine (ALLEGRA) 180 MG tablet Take 180 mg by mouth daily as needed for allergies.     No facility-administered medications prior to visit.    ROS Review of Systems  Constitutional: Negative.  Negative for appetite change, diaphoresis, fatigue and unexpected weight change.  HENT: Negative.    Eyes: Negative.   Respiratory:  Positive for shortness of breath (DOE). Negative for cough, chest tightness and wheezing.   Cardiovascular:  Negative for chest pain, palpitations and leg swelling.  Gastrointestinal:  Negative for abdominal pain, constipation, diarrhea, nausea and vomiting.  Endocrine: Negative.   Genitourinary: Negative.  Negative for difficulty urinating.  Musculoskeletal:  Positive for arthralgias. Negative for myalgias.  Skin:  Positive for rash. Negative for color change.  Neurological:  Negative for dizziness, weakness, light-headedness and headaches.  Hematological:  Negative for adenopathy. Does not bruise/bleed easily.  Psychiatric/Behavioral: Negative.      Objective:  BP 138/78 (BP Location: Right Arm, Patient Position: Sitting, Cuff Size: Large)   Pulse 61   Temp 98.2 F (36.8 C) (Oral)  Ht '5\' 4"'$  (1.626 m)   Wt 165 lb (74.8 kg)   SpO2 91%   BMI 28.32 kg/m   BP Readings from Last 3 Encounters:  04/30/22 138/78  02/24/22 138/60  01/01/22 (!) 154/80    Wt Readings from Last 3 Encounters:  04/30/22 165 lb (74.8 kg)  02/24/22 162 lb (73.5 kg)  01/01/22 165 lb (74.8 kg)    Physical Exam Vitals reviewed.  Constitutional:      Appearance: She is not ill-appearing.  HENT:     Nose: Nose normal.     Mouth/Throat:     Mouth: Mucous membranes are moist.  Eyes:     General: No scleral icterus.    Conjunctiva/sclera: Conjunctivae normal.  Cardiovascular:     Rate and Rhythm:  Regular rhythm. Bradycardia present.     Heart sounds: Normal heart sounds, S1 normal and S2 normal.     No friction rub. No gallop.     Comments: EKG- SB, 57 bpm Septal infarct pattern is old No ST/T wave changes Pulmonary:     Effort: Pulmonary effort is normal.     Breath sounds: No stridor. No wheezing, rhonchi or rales.  Abdominal:     General: Abdomen is flat.     Palpations: There is no mass.     Tenderness: There is no abdominal tenderness. There is no guarding.     Hernia: No hernia is present.  Musculoskeletal:     Right lower leg: No edema.     Left lower leg: No edema.  Skin:    General: Skin is warm.     Findings: Erythema and rash present. Rash is macular and papular. Rash is not nodular, pustular, scaling, urticarial or vesicular.     Comments: Coalesced, erythematous macules and papules over the upper anterior chest and posterior neck.  Neurological:     General: No focal deficit present.     Mental Status: Mental status is at baseline.  Psychiatric:        Mood and Affect: Mood normal.     Lab Results  Component Value Date   WBC 7.1 04/30/2022   HGB 12.7 04/30/2022   HCT 38.1 04/30/2022   PLT 163.0 04/30/2022   GLUCOSE 120 (H) 04/30/2022   CHOL 179 01/01/2022   TRIG 68.0 01/01/2022   HDL 101.40 01/01/2022   LDLDIRECT 104.5 04/25/2010   LDLCALC 64 01/01/2022   ALT 93 (H) 01/01/2022   AST 52 (H) 01/01/2022   NA 141 04/30/2022   K 3.7 04/30/2022   CL 103 04/30/2022   CREATININE 0.95 04/30/2022   BUN 12 04/30/2022   CO2 31 04/30/2022   TSH 3.60 01/01/2022   INR 0.92 06/05/2014   HGBA1C 6.1 04/30/2022   MICROALBUR 6.7 (H) 01/01/2022    No results found.  Assessment & Plan:   Briana [name] was seen today for rash and hypertension.  Diagnoses and all orders for this visit:  Essential hypertension, benign-her blood pressure is adequately well controlled. -     EKG 12-Lead -     Basic metabolic panel; Future -     CBC with Differential/Platelet;  Future -     CBC with Differential/Platelet -     Basic metabolic panel  Type II diabetes mellitus with manifestations (HCC)-her blood sugar is well controlled. -     Hemoglobin A1c; Future -     Hemoglobin A1c  Stage 3b chronic kidney disease (HCC)-her renal function is stable. -     Basic metabolic  panel; Future -     Basic metabolic panel  DOE (dyspnea on exertion)-her EKG is unchanged.  Her troponin is slightly elevated.  I recommended that she undergo a myocardial perfusion imaging to screen for ischemia and low ejection fraction. -     EKG 12-Lead -     Troponin I (High Sensitivity); Future -     Brain natriuretic peptide; Future -     CBC with Differential/Platelet; Future -     CBC with Differential/Platelet -     Brain natriuretic peptide -     Troponin I (High Sensitivity) -     MYOCARDIAL PERFUSION IMAGING; Future -     Cardiac Stress Test: Informed Consent Details: Physician/Practitioner Attestation; Transcribe to consent form and obtain patient signature; Future  Allergic contact dermatitis due to drugs in contact with skin-she will stop applying all the products she is currently applying to the area.  Will treat with systemic methylprednisolone, a topical steroid, and an antihistamine. -     methylPREDNISolone (MEDROL DOSEPAK) 4 MG TBPK tablet; TAKE AS DIRECTED -     betamethasone dipropionate 0.05 % cream; Apply topically 2 (two) times daily. -     hydrOXYzine (ATARAX) 10 MG tablet; Take 1 tablet (10 mg total) by mouth every 8 (eight) hours as needed.  Abnormal electrocardiogram (ECG) (EKG)-see above. -     Troponin I (High Sensitivity); Future -     Brain natriuretic peptide; Future -     Brain natriuretic peptide -     Troponin I (High Sensitivity) -     MYOCARDIAL PERFUSION IMAGING; Future -     Cardiac Stress Test: Informed Consent Details: Physician/Practitioner Attestation; Transcribe to consent form and obtain patient signature; Future  Elevated troponin-see  above. -     MYOCARDIAL PERFUSION IMAGING; Future -     Cardiac Stress Test: Informed Consent Details: Physician/Practitioner Attestation; Transcribe to consent form and obtain patient signature; Future   I have discontinued Briana [name]'s fexofenadine. I am also having her start on methylPREDNISolone, betamethasone dipropionate, and hydrOXYzine. Additionally, I am having her maintain her folic acid, Vitamin D, b complex vitamins, acetaminophen, hydroxychloroquine, dorzolamide-timolol, Rhopressa, latanoprost, lidocaine, meclizine, methotrexate, amLODipine, traMADol, atenolol, and atorvastatin.  Meds ordered this encounter  Medications   methylPREDNISolone (MEDROL DOSEPAK) 4 MG TBPK tablet    Sig: TAKE AS DIRECTED    Dispense:  21 tablet    Refill:  0   betamethasone dipropionate 0.05 % cream    Sig: Apply topically 2 (two) times daily.    Dispense:  45 g    Refill:  1   hydrOXYzine (ATARAX) 10 MG tablet    Sig: Take 1 tablet (10 mg total) by mouth every 8 (eight) hours as needed.    Dispense:  30 tablet    Refill:  0   I spent 50 minutes in preparing to see the patient by review of recent labs, obtaining and reviewing separately obtained history, communicating with the patient, ordering medications, an EKG , and MPI, and documenting clinical information in the EHR including the differential Dx, treatment, and any further evaluation and management of multiple complex medical issues.     Follow-up: Return in about 3 weeks (around 05/21/2022).  Scarlette Calico, MD

## 2022-04-30 NOTE — Patient Instructions (Signed)
Contact Dermatitis Dermatitis is redness, soreness, and swelling (inflammation) of the skin. Contact dermatitis is a reaction to certain substances that touch the skin. Many different substances can cause contact dermatitis. There are two types of contact dermatitis: Irritant contact dermatitis. This type is caused by something that irritates your skin, such as having dry hands from washing them too often with soap. This type does not require previous exposure to the substance for a reaction to occur. This is the most common type. Allergic contact dermatitis. This type is caused by a substance that you are allergic to, such as poison ivy. This type occurs when you have been exposed to the substance (allergen) and develop a sensitivity to it. Dermatitis may develop soon after your first exposure to the allergen, or it may not develop until the next time you are exposed and every time thereafter. What are the causes? Irritant contact dermatitis is most commonly caused by exposure to: Makeup. Soaps. Detergents. Bleaches. Acids. Metal salts, such as nickel. Allergic contact dermatitis is most commonly caused by exposure to: Poisonous plants. Chemicals. Jewelry. Latex. Medicines. Preservatives in products, such as clothing. What increases the risk? You are more likely to develop this condition if you have: A job that exposes you to irritants or allergens. Certain medical conditions, such as asthma or eczema. What are the signs or symptoms? Symptoms of this condition may occur on your body anywhere the irritant has touched you or is touched by you. Symptoms include: Dryness or flaking. Redness. Cracks. Itching. Pain or a burning feeling. Blisters. Drainage of small amounts of blood or clear fluid from skin cracks. With allergic contact dermatitis, there may also be swelling in areas such as the eyelids, mouth, or genitals. How is this diagnosed? This condition is diagnosed with a medical  history and physical exam. A patch skin test may be performed to help determine the cause. If the condition is related to your job, you may need to see an occupational medicine specialist. How is this treated? This condition is treated by checking for the cause of the reaction and protecting your skin from further contact. Treatment may also include: Steroid creams or ointments. Oral steroid medicines may be needed in more severe cases. Antibiotic medicines or antibacterial ointments, if a skin infection is present. Antihistamine lotion or an antihistamine taken by mouth to ease itching. A bandage (dressing). Follow these instructions at home: Skin care Moisturize your skin as needed. Apply cool compresses to the affected areas. Try applying baking soda paste to your skin. Stir water into baking soda until it reaches a paste-like consistency. Do not scratch your skin, and avoid friction to the affected area. Avoid the use of soaps, perfumes, and dyes. Medicines Take or apply over-the-counter and prescription medicines only as told by your health care provider. If you were prescribed an antibiotic medicine, take or apply the antibiotic as told by your health care provider. Do not stop using the antibiotic even if your condition improves. Bathing Try taking a bath with: Epsom salts. Follow the instructions on the packaging. You can get these at your local pharmacy or grocery store. Baking soda. Pour a small amount into the bath as directed by your health care provider. Colloidal oatmeal. Follow the instructions on the packaging. You can get this at your local pharmacy or grocery store. Bathe less frequently, such as every other day. Bathe in lukewarm water. Avoid using hot water. Bandage care If you were given a bandage (dressing), change it as told   by your health care provider. Wash your hands with soap and water before and after you change your dressing. If soap and water are not  available, use hand sanitizer. General instructions Avoid the substance that caused your reaction. If you do not know what caused it, keep a journal to try to track what caused it. Write down: What you eat. What cosmetic products you use. What you drink. What you wear in the affected area. This includes jewelry. Check the affected areas every day for signs of infection. Check for: More redness, swelling, or pain. More fluid or blood. Warmth. Pus or a bad smell. Keep all follow-up visits as told by your health care provider. This is important. Contact a health care provider if: Your condition does not improve with treatment. Your condition gets worse. You have signs of infection such as swelling, tenderness, redness, soreness, or warmth in the affected area. You have a fever. You have new symptoms. Get help right away if: You have a severe headache, neck pain, or neck stiffness. You vomit. You feel very sleepy. You notice red streaks coming from the affected area. Your bone or joint underneath the affected area becomes painful after the skin has healed. The affected area turns darker. You have difficulty breathing. Summary Dermatitis is redness, soreness, and swelling (inflammation) of the skin. Contact dermatitis is a reaction to certain substances that touch the skin. Symptoms of this condition may occur on your body anywhere the irritant has touched you or is touched by you. This condition is treated by figuring out what caused the reaction and protecting your skin from further contact. Treatment may also include medicines and skin care. Avoid the substance that caused your reaction. If you do not know what caused it, keep a journal to try to track what caused it. Contact a health care provider if your condition gets worse or you have signs of infection such as swelling, tenderness, redness, soreness, or warmth in the affected area. This information is not intended to replace  advice given to you by your health care provider. Make sure you discuss any questions you have with your health care provider. Document Revised: 07/14/2021 Document Reviewed: 07/14/2021 Elsevier Patient Education  2023 Elsevier Inc.  

## 2022-05-06 ENCOUNTER — Telehealth: Payer: Self-pay | Admitting: Internal Medicine

## 2022-05-06 NOTE — Telephone Encounter (Signed)
Pt is requesting a callback to go over lab results from 04/30/22.   Please advise.

## 2022-05-07 ENCOUNTER — Telehealth: Payer: Self-pay | Admitting: Internal Medicine

## 2022-05-07 ENCOUNTER — Encounter: Payer: Self-pay | Admitting: Internal Medicine

## 2022-05-07 NOTE — Telephone Encounter (Signed)
Called patient and patient's daughter about her covid symptoms. Patient states she is running a fever of 102, cough, vomiting and headache. Informed patient of what to do at home and OTC meds to take for her symptoms until she sees Dr. Quay Burow tomorrow. Advised the daughter that if the patient is feeling worse she can take her to the UC or ED.

## 2022-05-07 NOTE — Telephone Encounter (Signed)
Pt has been informed of the below:  "Briana [name] -   The 2 heart tests were mildly abnormal. I have ordered a heart test. Your kidney function is stable. The other labs are okay.   Scarlette Calico, MD "  She expressed understanding and will await for the phone call to schedule for heart testing.

## 2022-05-07 NOTE — Telephone Encounter (Signed)
Pt daughter Lesleigh Noe is requesting a callback with some advice on what her mom can do about her covid symptoms until the VV tomorrow at 3pm.    Please advise  CB: [place]

## 2022-05-07 NOTE — Progress Notes (Signed)
Virtual Visit via Video Note  I connected with ERNESTENE COOVER on 05/07/22 at  3:00 PM EDT by a video enabled telemedicine application and verified that I am speaking with the correct person using two identifiers.   I discussed the limitations of evaluation and management by telemedicine and the availability of in person appointments. The patient expressed understanding and agreed to proceed.  Present for the visit:  Myself, Dr Billey Gosling, Jakylah Wach and her son.  The patient is currently at home and I am in the office.    No referring provider.    History of Present Illness: Acute visit for covid.  Her symptoms started yesterday and she tested positive for COVID yesterday.  She has been holding her methotrexate.  She states a fever up to 102, sneezing, cough, nasal congestion, sinus pain, sore throat, some shortness of breath and wheezing.  She also has myalgias, dizziness and headaches.  She is fatigued.  She is decreased appetite.  She has been taking Tylenol.  Review of Systems  Constitutional:  Positive for fever.  HENT:  Positive for congestion, sinus pain and sore throat.   Respiratory:  Positive for cough, shortness of breath and wheezing.   Cardiovascular:  Negative for chest pain.  Musculoskeletal:  Positive for myalgias.  Neurological:  Positive for dizziness and headaches.      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   Social Determinants of Health   Financial Resource Strain: Low Risk  (11/21/2021)   Overall Financial Resource Strain (CARDIA)    Difficulty of Paying Living Expenses: Not very hard  Food Insecurity: No Food Insecurity (11/21/2021)   Hunger Vital Sign    Worried About Running Out of Food in the Last Year: Never true    Ran Out of Food in the Last Year: Never true  Transportation Needs: No Transportation Needs (11/21/2021)   PRAPARE - Hydrologist (Medical): No    Lack of Transportation (Non-Medical): No  Physical Activity: Insufficiently Active (11/21/2021)   Exercise Vital Sign    Days of Exercise per Week: 3 days    Minutes of Exercise per Session: 30 min  Stress: No Stress Concern Present (11/21/2021)   [place]    Feeling of Stress : Not at all  Social Connections: Moderately Integrated (11/21/2021)   Social Connection and Isolation Panel [NHANES]    Frequency of Communication with Friends and Family: More than three times a week    Frequency of Social Gatherings with Friends and Family: More than three times a week    Attends Religious Services:  More than 4 times per year    Active Member of Clubs or Organizations: Yes    Attends Archivist Meetings: More than 4 times per year    Marital Status: Widowed     Observations/Objective: Appears well in NAD She is breathing normally and able to speak in full sentences.  Assessment and Plan:  COVID: Acute Symptoms started yesterday and tested positive yesterday Today is day 2 of symptoms Symptoms are moderate in nature and she is high risk for complications She has held her methotrexate Start molnupiravir 4 tabs twice daily for 5 days.  Discussed possible side effects Discussed other  over-the-counter medications she can take for her symptoms Continue Tylenol Encouraged her to increase fluids She will call with any questions or concerns   Follow Up Instructions:    I discussed the assessment and treatment plan with the patient. The patient was provided an opportunity to ask questions and all were answered. The patient agreed with the plan and demonstrated an understanding of the instructions.   The patient was advised to call back or seek an in-person evaluation if the symptoms worsen or if the condition fails to improve as anticipated.    Binnie Rail, MD

## 2022-05-08 ENCOUNTER — Telehealth (INDEPENDENT_AMBULATORY_CARE_PROVIDER_SITE_OTHER): Payer: Medicare HMO | Admitting: Internal Medicine

## 2022-05-08 DIAGNOSIS — U071 COVID-19: Secondary | ICD-10-CM

## 2022-05-08 MED ORDER — MOLNUPIRAVIR 200 MG PO CAPS: 4.0000 | ORAL_CAPSULE | Freq: Two times a day (BID) | ORAL | 0 refills | Status: AC

## 2022-05-27 ENCOUNTER — Other Ambulatory Visit: Payer: Self-pay | Admitting: Internal Medicine

## 2022-05-27 DIAGNOSIS — I10 Essential (primary) hypertension: Secondary | ICD-10-CM

## 2022-06-04 DIAGNOSIS — Z6827 Body mass index (BMI) 27.0-27.9, adult: Secondary | ICD-10-CM | POA: Diagnosis not present

## 2022-06-04 DIAGNOSIS — Z79899 Other long term (current) drug therapy: Secondary | ICD-10-CM | POA: Diagnosis not present

## 2022-06-04 DIAGNOSIS — M0609 Rheumatoid arthritis without rheumatoid factor, multiple sites: Secondary | ICD-10-CM | POA: Diagnosis not present

## 2022-06-04 DIAGNOSIS — E663 Overweight: Secondary | ICD-10-CM | POA: Diagnosis not present

## 2022-06-04 DIAGNOSIS — M1991 Primary osteoarthritis, unspecified site: Secondary | ICD-10-CM | POA: Diagnosis not present

## 2022-07-07 ENCOUNTER — Other Ambulatory Visit: Payer: Self-pay | Admitting: Internal Medicine

## 2022-07-07 DIAGNOSIS — L233 Allergic contact dermatitis due to drugs in contact with skin: Secondary | ICD-10-CM

## 2022-07-09 DIAGNOSIS — Z1231 Encounter for screening mammogram for malignant neoplasm of breast: Secondary | ICD-10-CM | POA: Diagnosis not present

## 2022-07-11 ENCOUNTER — Other Ambulatory Visit: Payer: Self-pay | Admitting: Internal Medicine

## 2022-07-11 DIAGNOSIS — M170 Bilateral primary osteoarthritis of knee: Secondary | ICD-10-CM

## 2022-07-11 DIAGNOSIS — M05711 Rheumatoid arthritis with rheumatoid factor of right shoulder without organ or systems involvement: Secondary | ICD-10-CM

## 2022-07-31 DIAGNOSIS — Z23 Encounter for immunization: Secondary | ICD-10-CM | POA: Diagnosis not present

## 2022-08-02 ENCOUNTER — Other Ambulatory Visit: Payer: Self-pay | Admitting: Internal Medicine

## 2022-08-02 DIAGNOSIS — E785 Hyperlipidemia, unspecified: Secondary | ICD-10-CM

## 2022-08-04 ENCOUNTER — Other Ambulatory Visit: Payer: Self-pay | Admitting: Internal Medicine

## 2022-08-04 ENCOUNTER — Telehealth: Payer: Self-pay

## 2022-08-04 NOTE — Telephone Encounter (Signed)
[facility] called and stating that is was a med interaction indicated for meclizine and hydroxyzine. Pharmacist needed a verbal ok to fill both from PCP.   Please advise.   CB# [phone]

## 2022-08-21 DIAGNOSIS — H43813 Vitreous degeneration, bilateral: Secondary | ICD-10-CM | POA: Diagnosis not present

## 2022-08-21 DIAGNOSIS — H04123 Dry eye syndrome of bilateral lacrimal glands: Secondary | ICD-10-CM | POA: Diagnosis not present

## 2022-08-21 DIAGNOSIS — H26493 Other secondary cataract, bilateral: Secondary | ICD-10-CM | POA: Diagnosis not present

## 2022-08-21 DIAGNOSIS — H401113 Primary open-angle glaucoma, right eye, severe stage: Secondary | ICD-10-CM | POA: Diagnosis not present

## 2022-08-21 DIAGNOSIS — H10413 Chronic giant papillary conjunctivitis, bilateral: Secondary | ICD-10-CM | POA: Diagnosis not present

## 2022-08-21 DIAGNOSIS — Z961 Presence of intraocular lens: Secondary | ICD-10-CM | POA: Diagnosis not present

## 2022-09-18 ENCOUNTER — Telehealth: Payer: Medicare HMO

## 2022-10-22 ENCOUNTER — Other Ambulatory Visit: Payer: Self-pay | Admitting: Nurse Practitioner

## 2022-10-22 ENCOUNTER — Ambulatory Visit (INDEPENDENT_AMBULATORY_CARE_PROVIDER_SITE_OTHER): Payer: Medicare HMO

## 2022-10-22 ENCOUNTER — Ambulatory Visit (INDEPENDENT_AMBULATORY_CARE_PROVIDER_SITE_OTHER): Payer: Medicare HMO | Admitting: Nurse Practitioner

## 2022-10-22 VITALS — BP 134/68 | HR 60 | Temp 98.1°F | Ht 64.0 in

## 2022-10-22 DIAGNOSIS — M79671 Pain in right foot: Secondary | ICD-10-CM

## 2022-10-22 DIAGNOSIS — M779 Enthesopathy, unspecified: Secondary | ICD-10-CM

## 2022-10-22 MED ORDER — PREDNISONE 20 MG PO TABS: 20.0000 mg | ORAL_TABLET | Freq: Every day | ORAL | 0 refills | Status: DC

## 2022-10-22 NOTE — Patient Instructions (Signed)
[address] [place], [place] [address]

## 2022-10-22 NOTE — Assessment & Plan Note (Addendum)
Acute, concern for fracture with patient not being able to bear weight. Continue on tramadol, tylenol, start short course of prednisone for antiinflammatory. Xray ordered stat, will notify patient of results. Refer to ortho if fracture seen. While waiting for results rest, elevate, use Ice.

## 2022-10-22 NOTE — Progress Notes (Signed)
   Established Patient Office Visit  Subjective   Patient ID: Briana [name], female    DOB: 1930-12-16  Age: 89 y.o. MRN: [id number]  Chief Complaint  Patient presents with   right foot pain    Onset two days ago after walking on treadmill. No falls or other traumatic event proceeding onset. Pain is 10/10 at rest and worse with bearing weight. On tramadol chronically for her chronic pain. Reports it is not helping this acute pain, also taking tylenol.     ROS: see hpi    Objective:     BP 134/68   Pulse 60   Temp 98.1 F (36.7 C) (Temporal)   Ht '5\' 4"'$  (1.626 m)   SpO2 96%   BMI 28.32 kg/m    Physical Exam Vitals reviewed.  Constitutional:      General: She is not in acute distress.    Appearance: Normal appearance.  HENT:     Head: Normocephalic and atraumatic.  Neck:     Vascular: No carotid bruit.  Cardiovascular:     Rate and Rhythm: Normal rate and regular rhythm.     Pulses: Normal pulses.          Dorsalis pedis pulses are 2+ on the right side and 2+ on the left side.     Heart sounds: Normal heart sounds.  Pulmonary:     Effort: Pulmonary effort is normal.     Breath sounds: Normal breath sounds.  Musculoskeletal:       Feet:  Feet:     Right foot:     Skin integrity: Skin integrity normal.     Left foot:     Skin integrity: Skin integrity normal.     Comments: Mild swelling to right foot, nonpitting. Significant tenderness to area as indicated at red line.  Skin:    General: Skin is warm and dry.  Neurological:     General: No focal deficit present.     Mental Status: She is alert and oriented to person, place, and time.  Psychiatric:        Mood and Affect: Mood normal.        Behavior: Behavior normal.        Judgment: Judgment normal.      No results found for any visits on 10/22/22.    The ASCVD Risk score (Arnett DK, et al., 2019) failed to calculate for the following reasons:   The 2019 ASCVD risk score is only valid for ages  33 to 27    Assessment & Plan:   Problem List Items Addressed This Visit       Other   Right foot pain - Primary    Acute, concern for fracture with patient not being able to bear weight. Continue on tramadol, tylenol, start short course of prednisone for antiinflammatory. Xray ordered stat, will notify patient of results. Refer to ortho if fracture seen. While waiting for results rest, elevate, use Ice.       Relevant Medications   predniSONE (DELTASONE) 20 MG tablet   Other Relevant Orders   DG Foot Complete Right    Return if symptoms worsen or fail to improve.    Ailene Ards, NP

## 2022-11-12 ENCOUNTER — Ambulatory Visit (INDEPENDENT_AMBULATORY_CARE_PROVIDER_SITE_OTHER): Payer: Medicare HMO | Admitting: Orthopedic Surgery

## 2022-11-12 DIAGNOSIS — M6701 Short Achilles tendon (acquired), right ankle: Secondary | ICD-10-CM | POA: Diagnosis not present

## 2022-11-12 DIAGNOSIS — M6702 Short Achilles tendon (acquired), left ankle: Secondary | ICD-10-CM

## 2022-11-13 ENCOUNTER — Encounter: Payer: Self-pay | Admitting: Orthopedic Surgery

## 2022-11-13 NOTE — Progress Notes (Signed)
Office Visit Note   Patient: Briana [name]           Date of Birth: 01-Mar-1931           MRN: [id number] Visit Date: 11/12/2022              Requested by: Ailene Ards, NP [address] [place],  [place] [address] PCP: Janith Lima, MD  Chief Complaint  Patient presents with   Right Foot - Pain      HPI: Patient is an 89 year old woman who is seen for initial evaluation for foot and heel pain on the right.  Patient states that she started developing pain when she was walking on a treadmill she denies any falls or injuries.  Patient states she has had foot surgery in the past on the right with bone transfer.  Assessment & Plan: Visit Diagnoses:  1. Achilles tendon contracture, bilateral     Plan: Patient was given instructions and demonstrated Achilles stretching recommended stiff soled shoes.  Follow-Up Instructions: No follow-ups on file.   Ortho Exam  Patient is alert, oriented, no adenopathy, well-dressed, normal affect, normal respiratory effort. Examination patient has a palpable pulse.  Radiograph shows decreased bone mineral density but does not show any evidence of fracture.  There is no periarticular cystic changes no signs of gout radiographically on examination there is no redness or swelling around the joints.  Patient with her knee extended has dorsiflexion 20 degrees short of neutral.  Imaging: No results found. No images are attached to the encounter.  Labs: Lab Results  Component Value Date   HGBA1C 6.1 04/30/2022   HGBA1C 6.4 01/01/2022   HGBA1C 6.2 (H) 08/08/2021   ESRSEDRATE 43 (H) 10/22/2010   LABURIC 4.2 10/22/2010   REPTSTATUS 08/13/2021 FINAL 08/08/2021   CULT  08/08/2021    NO GROWTH 5 DAYS Performed at [facility], [place] [address], [place], [place] [address]      Lab Results  Component Value Date   ALBUMIN 4.1 01/01/2022   ALBUMIN 3.7 08/08/2021   ALBUMIN 3.7 08/07/2021    No results found for: "MG" No results  found for: "VD25OH"  No results found for: "PREALBUMIN"    Latest Ref Rng & Units 04/30/2022   12:12 PM 01/01/2022    9:22 AM 08/08/2021    8:00 AM  CBC EXTENDED  WBC 4.0 - 10.5 K/uL 7.1  7.8  11.7   RBC 3.87 - 5.11 Mil/uL 4.12  4.13  4.34   Hemoglobin 12.0 - 15.0 g/dL 12.7  12.7  13.9   HCT 36.0 - 46.0 % 38.1  37.7  39.6   Platelets 150.0 - 400.0 K/uL 163.0  177.0  161   NEUT# 1.4 - 7.7 K/uL 4.6  5.3  8.8   Lymph# 0.7 - 4.0 K/uL 1.4  1.6  1.7      There is no height or weight on file to calculate BMI.  Orders:  No orders of the defined types were placed in this encounter.  No orders of the defined types were placed in this encounter.    Procedures: No procedures performed  Clinical Data: No additional findings.  ROS:  All other systems negative, except as noted in the HPI. Review of Systems  Objective: Vital Signs: There were no vitals taken for this visit.  Specialty Comments:  No specialty comments available.  PMFS History: Patient Active Problem List   Diagnosis Date Noted   Right foot pain  10/22/2022   Allergic contact dermatitis due to drugs in contact with skin 04/30/2022   DOE (dyspnea on exertion) 04/30/2022   Abnormal electrocardiogram (ECG) (EKG) 04/30/2022   Elevated troponin 04/30/2022   Body mass index (BMI) 31.0-31.9, adult 11/21/2021   Chronic fatigue syndrome 11/21/2021   Other long term (current) drug therapy 11/21/2021   Sciatica 11/21/2021   Stage 3b chronic kidney disease ([place]) 05/30/2020   Type II diabetes mellitus with manifestations ([place]) 12/28/2019   Vertigo, benign paroxysmal, bilateral 08/15/2019   Left lumbar radiculitis 03/07/2017   Essential hypertension, benign 06/19/2014   RA (rheumatoid arthritis) ([place]) 06/19/2014   Constipation due to opioid therapy 06/19/2014   Routine health maintenance 05/30/2012   INSOMNIA UNSPECIFIED 10/31/2007   Hyperlipidemia with target LDL less than 130 10/28/2007   Seasonal allergic rhinitis  10/28/2007   DJD (degenerative joint disease) of knee 10/28/2007   Past Medical History:  Diagnosis Date   ACUTE URIS OF UNSPECIFIED SITE 11/26/2008   ALLERGIC ARTHRITIS OTHER SPECIFIED SITES 10/22/2010   OA + RA   ALLERGIC RHINITIS 10/28/2007   ARTHRITIS, KNEES, BILATERAL 10/28/2007   BACK PAIN 04/12/2009   BUNIONECTOMY, HX OF 10/28/2007   CELLULITIS&ABSCESS OF HAND EXCEPT FINGERS&THUMB 2/62/0355   Complication of anesthesia    itching and makes me crazy   Family history of anesthesia complication    DAUGHTER HAD HIVES AND " WENT WILD "   HYPERLIPIDEMIA 10/28/2007   HYPERTENSION 10/28/2007   INSOMNIA UNSPECIFIED 10/31/2007   MYOSITIS 10/22/2010   NECK PAIN 12/03/2008   Pain in joint, site unspecified 10/07/2010   WRIST PAIN, LEFT 10/22/2010    Family History  Problem Relation Age of Onset   Cancer Brother        lung cancer   Coronary artery disease Other    Heart attack Other    Cancer Sister        Lung, stomach    Past Surgical History:  Procedure Laterality Date   arthroscopic surgery and rotator cuff repair left shoulder     arthroscopy right knee hx of surgery     BUNIONECTOMY     hx of   FOOT SURGERY     RIGHT   hand surgery for broken finger, remote     JOINT REPLACEMENT Right    knee   TOTAL KNEE ARTHROPLASTY  07/01/2012   Procedure: TOTAL KNEE ARTHROPLASTY;  Surgeon: Yvette Rack., MD;  Location: [facility];  Service: Orthopedics;  Laterality: Right;  right total knee arthroplasty   TOTAL KNEE ARTHROPLASTY Left 06/13/2014   dr Percell Miller   TOTAL KNEE ARTHROPLASTY Left 06/13/2014   Procedure: TOTAL KNEE ARTHROPLASTY;  Surgeon: Ninetta Lights, MD;  Location: [facility];  Service: Orthopedics;  Laterality: Left;   Social History   Occupational History   Occupation: ASSN II 1    Employer: [employer]  Tobacco Use   Smoking status: Never   Smokeless tobacco: Never  Vaping Use   Vaping Use: Never used  Substance and Sexual Activity   Alcohol use: No   Drug use: No    Sexual activity: Not Currently

## 2022-12-07 DIAGNOSIS — E663 Overweight: Secondary | ICD-10-CM | POA: Diagnosis not present

## 2022-12-07 DIAGNOSIS — R42 Dizziness and giddiness: Secondary | ICD-10-CM | POA: Diagnosis not present

## 2022-12-07 DIAGNOSIS — M0609 Rheumatoid arthritis without rheumatoid factor, multiple sites: Secondary | ICD-10-CM | POA: Diagnosis not present

## 2022-12-07 DIAGNOSIS — Z6826 Body mass index (BMI) 26.0-26.9, adult: Secondary | ICD-10-CM | POA: Diagnosis not present

## 2022-12-07 DIAGNOSIS — M1991 Primary osteoarthritis, unspecified site: Secondary | ICD-10-CM | POA: Diagnosis not present

## 2022-12-07 DIAGNOSIS — Z79899 Other long term (current) drug therapy: Secondary | ICD-10-CM | POA: Diagnosis not present

## 2022-12-07 LAB — HM DIABETES EYE EXAM

## 2022-12-10 ENCOUNTER — Ambulatory Visit (INDEPENDENT_AMBULATORY_CARE_PROVIDER_SITE_OTHER): Payer: Medicare HMO

## 2022-12-10 VITALS — Wt 165.0 lb

## 2022-12-10 DIAGNOSIS — Z0000 Encounter for general adult medical examination without abnormal findings: Secondary | ICD-10-CM

## 2022-12-10 NOTE — Progress Notes (Signed)
Subjective:   [name] is a 89 y.o. female who presents for Medicare Annual (Subsequent) preventive examination.  I connected with  Briana [name] on 12/10/22 by a audio enabled telemedicine application and verified that I am speaking with the correct person using two identifiers.  Patient Location: Home  Provider Location: Home Office  I discussed the limitations of evaluation and management by telemedicine. The patient expressed understanding and agreed to proceed.   Review of Systems     Cardiac Risk Factors include: advanced age (>81mn, >>19women);diabetes mellitus;dyslipidemia;hypertension;sedentary lifestyle     Objective:    Today's Vitals   12/10/22 1433  Weight: 165 lb (74.8 kg)   Body mass index is 28.32 kg/m.     12/10/2022    3:00 PM 11/26/2021    7:21 PM 11/21/2021    1:19 PM 08/08/2021   10:00 PM 08/08/2021   11:59 AM 08/07/2021    4:49 PM 07/11/2020   11:02 AM  Advanced Directives  Does Patient Have a Medical Advance Directive? Yes No No No No No No  Type of [place]        Copy of HLynnin Chart? No - copy requested        Would patient like information on creating a medical advance directive?  No - Patient declined No - Patient declined No - Patient declined No - Patient declined No - Patient declined No - Patient declined    Current Medications (verified) Outpatient Encounter Medications as of 12/10/2022  Medication Sig   acetaminophen (TYLENOL) 650 MG CR tablet Take 650 mg by mouth every 8 (eight) hours as needed for pain.   amLODipine (NORVASC) 10 MG tablet TAKE 1 TABLET EVERY DAY   atenolol (TENORMIN) 100 MG tablet TAKE 1 TABLET EVERY DAY   atorvastatin (LIPITOR) 10 MG tablet TAKE 1 TABLET AT BEDTIME   b complex vitamins tablet Take 1 tablet by mouth daily.   Cholecalciferol (VITAMIN D) 2000 UNITS tablet Take 2,000 Units by mouth daily.   dorzolamide-timolol  (COSOPT) 22.3-6.8 MG/ML ophthalmic solution Place 1 drop into both eyes 2 (two) times daily.   hydroxychloroquine (PLAQUENIL) 200 MG tablet Take 200 mg by mouth 2 (two) times daily.   hydrOXYzine (ATARAX) 10 MG tablet TAKE 1 TABLET(10 MG) BY MOUTH EVERY 8 HOURS AS NEEDED   latanoprost (XALATAN) 0.005 % ophthalmic solution Place 1 drop into both eyes at bedtime.   Netarsudil Dimesylate (RHOPRESSA) 0.02 % SOLN Place 1 drop into the right eye at bedtime.   traMADol (ULTRAM) 50 MG tablet TAKE 1 TABLET EVERY 12 HOURS AS NEEDED FOR MODERATE PAIN   [DISCONTINUED] betamethasone dipropionate 0.05 % cream Apply topically 2 (two) times daily.   [DISCONTINUED] folic acid (FOLVITE) 1 MG tablet Take 1 mg by mouth daily.   [DISCONTINUED] lidocaine (LIDODERM) 5 % Place 1 patch onto the skin daily. Remove & Discard patch within 12 hours or as directed by MD   [DISCONTINUED] methotrexate 2.5 MG tablet    [DISCONTINUED] predniSONE (DELTASONE) 20 MG tablet Take 1 tablet (20 mg total) by mouth daily with breakfast.   No facility-administered encounter medications on file as of 12/10/2022.    Allergies (verified) Ibuprofen; Percocet [oxycodone-acetaminophen]; Simvastatin; 2,4-d dimethylamine; Diltiazem hcl; Hydrochlorothiazide; Lisinopril; Valacyclovir; and Verapamil   History: Past Medical History:  Diagnosis Date   ACUTE URIS OF UNSPECIFIED SITE 11/26/2008   ALLERGIC ARTHRITIS OTHER SPECIFIED SITES 10/22/2010   OA + RA  ALLERGIC RHINITIS 10/28/2007   ARTHRITIS, KNEES, BILATERAL 10/28/2007   BACK PAIN 04/12/2009   BUNIONECTOMY, HX OF 10/28/2007   CELLULITIS&ABSCESS OF HAND EXCEPT FINGERS&THUMB 0000000   Complication of anesthesia    itching and makes me crazy   Family history of anesthesia complication    DAUGHTER HAD HIVES AND " WENT WILD "   HYPERLIPIDEMIA 10/28/2007   HYPERTENSION 10/28/2007   INSOMNIA UNSPECIFIED 10/31/2007   MYOSITIS 10/22/2010   NECK PAIN 12/03/2008   Pain in joint, site unspecified  10/07/2010   WRIST PAIN, LEFT 10/22/2010   Past Surgical History:  Procedure Laterality Date   arthroscopic surgery and rotator cuff repair left shoulder     arthroscopy right knee hx of surgery     BUNIONECTOMY     hx of   FOOT SURGERY     RIGHT   hand surgery for broken finger, remote     JOINT REPLACEMENT Right    knee   TOTAL KNEE ARTHROPLASTY  07/01/2012   Procedure: TOTAL KNEE ARTHROPLASTY;  Surgeon: Yvette Rack., MD;  Location: [facility];  Service: Orthopedics;  Laterality: Right;  right total knee arthroplasty   TOTAL KNEE ARTHROPLASTY Left 06/13/2014   dr Percell Miller   TOTAL KNEE ARTHROPLASTY Left 06/13/2014   Procedure: TOTAL KNEE ARTHROPLASTY;  Surgeon: Ninetta Lights, MD;  Location: [facility];  Service: Orthopedics;  Laterality: Left;   Family History  Problem Relation Age of Onset   Cancer Brother        lung cancer   Coronary artery disease Other    Heart attack Other    Cancer Sister        Lung, stomach   Social History   Socioeconomic History   Marital status: Widowed    Spouse name: Not on file   Number of children: 4   Years of education: 12   Highest education level: Not on file  Occupational History   Occupation: ASSN II 1    Employer: [employer]  Tobacco Use   Smoking status: Never   Smokeless tobacco: Never  Vaping Use   Vaping Use: Never used  Substance and Sexual Activity   Alcohol use: No   Drug use: No   Sexual activity: Not Currently  Other Topics Concern   Not on file  Social History Narrative   Married in [place] widowed on 00   4 sons, 1 daughter, 6 grandchildren, 3 great-grands   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   Social Determinants of Health   Financial Resource Strain: Low Risk  (12/10/2022)   Overall Financial Resource Strain (CARDIA)    Difficulty of Paying Living Expenses: Not hard at all  Food Insecurity: No Food Insecurity (12/10/2022)   Hunger Vital Sign     Worried About Running Out of Food in the Last Year: Never true    Ran Out of Food in the Last Year: Never true  Transportation Needs: No Transportation Needs (12/10/2022)   PRAPARE - Hydrologist (Medical): No    Lack of Transportation (Non-Medical): No  Physical Activity: Insufficiently Active (12/10/2022)   Exercise Vital Sign    Days of Exercise per Week: 7 days    Minutes of Exercise per Session: 10 min  Stress: No Stress Concern Present (12/10/2022)   [place]    Feeling of Stress :  Only a little  Social Connections: Moderately Integrated (12/10/2022)   Social Connection and Isolation Panel [NHANES]    Frequency of Communication with Friends and Family: More than three times a week    Frequency of Social Gatherings with Friends and Family: More than three times a week    Attends Religious Services: More than 4 times per year    Active Member of [employer] or Organizations: Yes    Attends Archivist Meetings: More than 4 times per year    Marital Status: Widowed    Tobacco Counseling Counseling given: Not Answered   Clinical Intake:  Pre-visit preparation completed: Yes  Pain : No/denies pain     BMI - recorded: 28.32 Nutritional Status: BMI 25 -29 Overweight Nutritional Risks: None Diabetes: No  How often do you need to have someone help you when you read instructions, pamphlets, or other written materials from your doctor or pharmacy?: 1 - Never  Diabetic? Nutrition Risk Assessment:  Has the patient had any N/V/D within the last 2 months?   Yes, nausea with vertigo intermittently Does the patient have any non-healing wounds?  No  Has the patient had any unintentional weight loss or weight gain?  No   Diabetes:  Is the patient diabetic?  Yes  If diabetic, was a CBG obtained today?  No  Did the patient bring in their glucometer from home?  No  How often do you  monitor your CBG's? never.   Financial Strains and Diabetes Management:  Are you having any financial strains with the device, your supplies or your medication? No .  Does the patient want to be seen by Chronic Care Management for management of their diabetes?  No  Would the patient like to be referred to a Nutritionist or for Diabetic Management?  No   Diabetic Exams:  Diabetic Eye Exam: Completed 11/13/2021 Diabetic Foot Exam: Completed 01/01/2022    Interpreter Needed?: No  Information entered by :: Kimmi Acocella, LPN   Activities of Daily Living    12/10/2022    3:00 PM  In your present state of health, do you have any difficulty performing the following activities:  Hearing? 0  Vision? 0  Difficulty concentrating or making decisions? 0  Walking or climbing stairs? 1  Dressing or bathing? 0  Doing errands, shopping? 1  Comment glaucoma - son drives her  Preparing Food and eating ? N  Using the Toilet? N  In the past six months, have you accidently leaked urine? N  Do you have problems with loss of bowel control? N  Managing your Medications? N  Managing your Finances? N  Housekeeping or managing your Housekeeping? N    Patient Care Team: Janith Lima, MD as PCP - General (Internal Medicine) Gavin Pound, MD as Consulting Physician (Rheumatology) Clent Jacks, MD as Consulting Physician (Ophthalmology) Gatha Mayer, MD as Consulting Physician (Gastroenterology) Charlton Haws, [facility] as Pharmacist (Pharmacist)  Indicate any recent Medical Services you may have received from other than [facility] providers in the past year (date may be approximate).     Assessment:   This is a routine wellness examination for Briana [name].  Hearing/Vision screen Hearing Screening - Comments:: Denies hearing difficulties   Vision Screening - Comments:: Wears rx glasses; has glaucoma - up to date with routine eye exams with [facility] - has appt 12/25/2022  Dietary issues and exercise  activities discussed: Current Exercise Habits: Home exercise routine, Type of exercise: walking, Time (Minutes): 10, Frequency (Times/Week):  7, Weekly Exercise (Minutes/Week): 70, Intensity: Mild, Exercise limited by: orthopedic condition(s)   Goals Addressed             This Visit's Progress    Patient Stated   On track    Stay active physically and socially active. Enjoy life, church and love family.       Depression Screen    12/10/2022    2:59 PM 10/22/2022    1:27 PM 11/21/2021    1:15 PM 07/11/2020   11:00 AM 06/20/2019    2:02 PM 12/16/2018    4:27 PM 06/02/2018    9:29 AM  PHQ 2/9 Scores  PHQ - 2 Score 0 0 0 0 0 0 1  PHQ- 9 Score 0 0     6    Fall Risk    12/10/2022    2:57 PM 10/22/2022    1:27 PM 11/21/2021    1:19 PM 07/11/2020   11:02 AM 06/20/2019    2:02 PM  Fall Risk   Falls in the past year? 0 0 0 0 1  Number falls in past yr: 0 0 0 0 0  Injury with Fall? 0 0 0 0 1  Risk for fall due to : No Fall Risks No Fall Risks Impaired balance/gait;Impaired mobility No Fall Risks Impaired balance/gait  Follow up Falls prevention discussed;Falls evaluation completed Falls evaluation completed Falls prevention discussed Falls evaluation completed Falls prevention discussed    FALL RISK PREVENTION PERTAINING TO THE HOME:  Any stairs in or around the home? No  If so, are there any without handrails? No  Home free of loose throw rugs in walkways, pet beds, electrical cords, etc? Yes  Adequate lighting in your home to reduce risk of falls? Yes   ASSISTIVE DEVICES UTILIZED TO PREVENT FALLS:  Life alert? No  - used to have one, but it was too sensitive Use of a cane, walker or w/c? Yes  Grab bars in the bathroom? Yes  Shower chair or bench in shower? Yes  Elevated toilet seat or a handicapped toilet? Yes   TIMED UP AND GO:  Was the test performed? No . televisit  Cognitive Function:    06/20/2019    2:16 PM 06/02/2018    9:30 AM  MMSE - Mini Mental State Exam  Not  completed: Refused   Orientation to time  5  Orientation to Place  5  Registration  3  Attention/ Calculation  3  Recall  3  Language- name 2 objects  2  Language- repeat  1  Language- follow 3 step command  3  Language- read & follow direction  1  Write a sentence  1  Copy design  1  Total score  28        12/10/2022    3:00 PM 11/21/2021    1:22 PM  6CIT Screen  What Year? 0 points 0 points  What month? 0 points 0 points  What time? 0 points 0 points  Count back from 20 0 points 0 points  Months in reverse 0 points 0 points  Repeat phrase 0 points 0 points  Total Score 0 points 0 points    Immunizations Immunization History  Administered Date(s) Administered   Influenza Split 07/03/2012   Influenza, High Dose Seasonal PF 07/24/2013, 07/15/2018, 07/15/2020   Influenza,inj,Quad PF,6+ Mos 08/06/2014   Influenza-Unspecified 07/12/2017, 07/18/2019, 07/15/2021   Moderna Sars-Covid-2 Vaccination 10/24/2019, 11/21/2019   Pneumococcal Conjugate-13 10/23/2015   Pneumococcal Polysaccharide-23 04/19/2014, 12/30/2020  Td 05/30/2012   Zoster Recombinat (Shingrix) 10/21/2021, 12/23/2021   Zoster, Live 07/13/2015    TDAP status: Due, Education has been provided regarding the importance of this vaccine. Advised may receive this vaccine at local pharmacy or Health Dept. Aware to provide a copy of the vaccination record if obtained from local pharmacy or Health Dept. Verbalized acceptance and understanding.  Flu Vaccine status: Up to date  Pneumococcal vaccine status: Up to date  Covid-19 vaccine status: Completed vaccines  Qualifies for Shingles Vaccine? Yes   Zostavax completed Yes   Shingrix Completed?: Yes  Screening Tests Health Maintenance  Topic Date Due   COVID-19 Vaccine (3 - Moderna risk series) 12/19/2019   INFLUENZA VACCINE  05/12/2022   DTaP/Tdap/Td (2 - Tdap) 05/30/2022   HEMOGLOBIN A1C  10/31/2022   OPHTHALMOLOGY EXAM  11/13/2022   FOOT EXAM  01/02/2023    Medicare Annual Wellness (AWV)  12/10/2023   Pneumonia Vaccine 45+ Years old  Completed   DEXA SCAN  Completed   Zoster Vaccines- Shingrix  Completed   HPV VACCINES  Aged Out    Health Maintenance  Health Maintenance Due  Topic Date Due   COVID-19 Vaccine (3 - Moderna risk series) 12/19/2019   INFLUENZA VACCINE  05/12/2022   DTaP/Tdap/Td (2 - Tdap) 05/30/2022   HEMOGLOBIN A1C  10/31/2022   OPHTHALMOLOGY EXAM  11/13/2022    Colorectal cancer screening: No longer required.   Mammogram status: No longer required due to age.  DEXA: NO LONGER REQUIRED AT AGE 69  Lung Cancer Screening: (Low Dose CT Chest recommended if Age 23-80 years, 30 pack-year currently smoking OR have quit w/in 15years.) does not qualify.   Additional Screening:  Hepatitis C Screening: does not qualifY  Vision Screening: Recommended annual ophthalmology exams for early detection of glaucoma and other disorders of the eye. Is the patient up to date with their annual eye exam?  Yes  Who is the provider or what is the name of the office in which the patient attends annual eye exams? [facility] If pt is not established with a provider, would they like to be referred to a provider to establish care? No .   Dental Screening: Recommended annual dental exams for proper oral hygiene  Community Resource Referral / Chronic Care Management: CRR required this visit?  No   CCM required this visit?  No      Plan:     I have personally reviewed and noted the following in the patient's chart:   Medical and social history Use of alcohol, tobacco or illicit drugs  Current medications and supplements including opioid prescriptions. Patient is not currently taking opioid prescriptions. Functional ability and status Nutritional status Physical activity Advanced directives List of other physicians Hospitalizations, surgeries, and ER visits in previous 12 months Vitals Screenings to include cognitive, depression, and  falls Referrals and appointments  In addition, I have reviewed and discussed with patient certain preventive protocols, quality metrics, and best practice recommendations. A written personalized care plan for preventive services as well as general preventive health recommendations were provided to patient.     Sandrea Hammond, LPN   [id number]   Nurse Notes: She has vertigo quite often now, whenever she turns her head - this started last year, but used to be infrequent.

## 2022-12-10 NOTE — Patient Instructions (Signed)
Briana [name] , Thank you for taking time to come for your Medicare Wellness Visit. I appreciate your ongoing commitment to your health goals. Please review the following plan we discussed and let me know if I can assist you in the future.   These are the goals we discussed:  Goals      Manage My Medicine     Timeframe:  Long-Range Goal Priority:  Medium Start Date:     01/01/21                        Expected End Date:  07/07/22  Follow Up Date March 2023   - call for medicine refill 2 or 3 days before it runs out - call if I am sick and can't take my medicine - keep a list of all the medicines I take; vitamins and herbals too - use a pillbox to sort medicine    Why is this important?   These steps will help you keep on track with your medicines.   Notes:      Patient Stated     Stay active physically and socially active. Enjoy life, church and love family.     Track and Manage My Blood Pressure-Hypertension     Timeframe:  Long-Range Goal Priority:  High Start Date:       01/01/21                      Expected End Date:   03/21/2023                    Follow Up Date 09/2022   - check blood pressure 3 times per week - choose a place to take my blood pressure (home, clinic or office, retail store) - write blood pressure results in a log or diary    Why is this important?   You won't feel high blood pressure, but it can still hurt your blood vessels.  High blood pressure can cause heart or kidney problems. It can also cause a stroke.  Making lifestyle changes like losing a little weight or eating less salt will help.  Checking your blood pressure at home and at different times of the day can help to control blood pressure.  If the doctor prescribes medicine remember to take it the way the doctor ordered.  Call the office if you cannot afford the medicine or if there are questions about it.     Notes:         This is a list of the screening recommended for you and due  dates:  Health Maintenance  Topic Date Due   COVID-19 Vaccine (3 - Moderna risk series) 12/19/2019   Flu Shot  05/12/2022   DTaP/Tdap/Td vaccine (2 - Tdap) 05/30/2022   Hemoglobin A1C  10/31/2022   Eye exam for diabetics  11/13/2022   Complete foot exam   01/02/2023   Medicare Annual Wellness Visit  12/10/2023   Pneumonia Vaccine  Completed   DEXA scan (bone density measurement)  Completed   Zoster (Shingles) Vaccine  Completed   HPV Vaccine  Aged Out    Advanced directives: Please bring a copy of your health care power of attorney and living will to the office to be added to your chart at your convenience.   Conditions/risks identified: Try to stretch and walk daily to keep up your strength.  Next appointment: Follow up in one year for your  annual wellness visit    Preventive Care 65 Years and Older, Female Preventive care refers to lifestyle choices and visits with your health care provider that can promote health and wellness. What does preventive care include? A yearly physical exam. This is also called an annual well check. Dental exams once or twice a year. Routine eye exams. Ask your health care provider how often you should have your eyes checked. Personal lifestyle choices, including: Daily care of your teeth and gums. Regular physical activity. Eating a healthy diet. Avoiding tobacco and drug use. Limiting alcohol use. Practicing safe sex. Taking low-dose aspirin every day. Taking vitamin and mineral supplements as recommended by your health care provider. What happens during an annual well check? The services and screenings done by your health care provider during your annual well check will depend on your age, overall health, lifestyle risk factors, and family history of disease. Counseling  Your health care provider may ask you questions about your: Alcohol use. Tobacco use. Drug use. Emotional well-being. Home and relationship well-being. Sexual  activity. Eating habits. History of falls. Memory and ability to understand (cognition). Work and work Statistician. Reproductive health. Screening  You may have the following tests or measurements: Height, weight, and BMI. Blood pressure. Lipid and cholesterol levels. These may be checked every 5 years, or more frequently if you are over 13 years old. Skin check. Lung cancer screening. You may have this screening every year starting at age 62 if you have a 30-pack-year history of smoking and currently smoke or have quit within the past 15 years. Fecal occult blood test (FOBT) of the stool. You may have this test every year starting at age 79. Flexible sigmoidoscopy or colonoscopy. You may have a sigmoidoscopy every 5 years or a colonoscopy every 10 years starting at age 95. Hepatitis C blood test. Hepatitis B blood test. Sexually transmitted disease (STD) testing. Diabetes screening. This is done by checking your blood sugar (glucose) after you have not eaten for a while (fasting). You may have this done every 1-3 years. Bone density scan. This is done to screen for osteoporosis. You may have this done starting at age 66. Mammogram. This may be done every 1-2 years. Talk to your health care provider about how often you should have regular mammograms. Talk with your health care provider about your test results, treatment options, and if necessary, the need for more tests. Vaccines  Your health care provider may recommend certain vaccines, such as: Influenza vaccine. This is recommended every year. Tetanus, diphtheria, and acellular pertussis (Tdap, Td) vaccine. You may need a Td booster every 10 years. Zoster vaccine. You may need this after age 19. Pneumococcal 13-valent conjugate (PCV13) vaccine. One dose is recommended after age 24. Pneumococcal polysaccharide (PPSV23) vaccine. One dose is recommended after age 28. Talk to your health care provider about which screenings and vaccines  you need and how often you need them. This information is not intended to replace advice given to you by your health care provider. Make sure you discuss any questions you have with your health care provider. Document Released: 10/25/2015 Document Revised: 06/17/2016 Document Reviewed: 07/30/2015 Elsevier Interactive Patient Education  2017 [place] Prevention in the Home Falls can cause injuries. They can happen to people of all ages. There are many things you can do to make your home safe and to help prevent falls. What can I do on the outside of my home? Regularly fix the edges of walkways and  driveways and fix any cracks. Remove anything that might make you trip as you walk through a door, such as a raised step or threshold. Trim any bushes or trees on the path to your home. Use bright outdoor lighting. Clear any walking paths of anything that might make someone trip, such as rocks or tools. Regularly check to see if handrails are loose or broken. Make sure that both sides of any steps have handrails. Any raised decks and porches should have guardrails on the edges. Have any leaves, snow, or ice cleared regularly. Use sand or salt on walking paths during winter. Clean up any spills in your garage right away. This includes oil or grease spills. What can I do in the bathroom? Use night lights. Install grab bars by the toilet and in the tub and shower. Do not use towel bars as grab bars. Use non-skid mats or decals in the tub or shower. If you need to sit down in the shower, use a plastic, non-slip stool. Keep the floor dry. Clean up any water that spills on the floor as soon as it happens. Remove soap buildup in the tub or shower regularly. Attach bath mats securely with double-sided non-slip rug tape. Do not have throw rugs and other things on the floor that can make you trip. What can I do in the bedroom? Use night lights. Make sure that you have a light by your bed that  is easy to reach. Do not use any sheets or blankets that are too big for your bed. They should not hang down onto the floor. Have a firm chair that has side arms. You can use this for support while you get dressed. Do not have throw rugs and other things on the floor that can make you trip. What can I do in the kitchen? Clean up any spills right away. Avoid walking on wet floors. Keep items that you use a lot in easy-to-reach places. If you need to reach something above you, use a strong step stool that has a grab bar. Keep electrical cords out of the way. Do not use floor polish or wax that makes floors slippery. If you must use wax, use non-skid floor wax. Do not have throw rugs and other things on the floor that can make you trip. What can I do with my stairs? Do not leave any items on the stairs. Make sure that there are handrails on both sides of the stairs and use them. Fix handrails that are broken or loose. Make sure that handrails are as long as the stairways. Check any carpeting to make sure that it is firmly attached to the stairs. Fix any carpet that is loose or worn. Avoid having throw rugs at the top or bottom of the stairs. If you do have throw rugs, attach them to the floor with carpet tape. Make sure that you have a light switch at the top of the stairs and the bottom of the stairs. If you do not have them, ask someone to add them for you. What else can I do to help prevent falls? Wear shoes that: Do not have high heels. Have rubber bottoms. Are comfortable and fit you well. Are closed at the toe. Do not wear sandals. If you use a stepladder: Make sure that it is fully opened. Do not climb a closed stepladder. Make sure that both sides of the stepladder are locked into place. Ask someone to hold it for you, if possible. Clearly mark  and make sure that you can see: Any grab bars or handrails. First and last steps. Where the edge of each step is. Use tools that help you  move around (mobility aids) if they are needed. These include: Canes. Walkers. Scooters. Crutches. Turn on the lights when you go into a dark area. Replace any light bulbs as soon as they burn out. Set up your furniture so you have a clear path. Avoid moving your furniture around. If any of your floors are uneven, fix them. If there are any pets around you, be aware of where they are. Review your medicines with your doctor. Some medicines can make you feel dizzy. This can increase your chance of falling. Ask your doctor what other things that you can do to help prevent falls. This information is not intended to replace advice given to you by your health care provider. Make sure you discuss any questions you have with your health care provider. Document Released: 07/25/2009 Document Revised: 03/05/2016 Document Reviewed: 11/02/2014 Elsevier Interactive Patient Education  2017 [employer].

## 2022-12-14 ENCOUNTER — Other Ambulatory Visit: Payer: Self-pay | Admitting: Internal Medicine

## 2022-12-14 DIAGNOSIS — H8113 Benign paroxysmal vertigo, bilateral: Secondary | ICD-10-CM

## 2022-12-25 DIAGNOSIS — H10413 Chronic giant papillary conjunctivitis, bilateral: Secondary | ICD-10-CM | POA: Diagnosis not present

## 2022-12-25 DIAGNOSIS — Z961 Presence of intraocular lens: Secondary | ICD-10-CM | POA: Diagnosis not present

## 2022-12-25 DIAGNOSIS — H26493 Other secondary cataract, bilateral: Secondary | ICD-10-CM | POA: Diagnosis not present

## 2022-12-25 DIAGNOSIS — H401113 Primary open-angle glaucoma, right eye, severe stage: Secondary | ICD-10-CM | POA: Diagnosis not present

## 2022-12-25 DIAGNOSIS — H04123 Dry eye syndrome of bilateral lacrimal glands: Secondary | ICD-10-CM | POA: Diagnosis not present

## 2023-01-07 ENCOUNTER — Encounter: Payer: Self-pay | Admitting: Internal Medicine

## 2023-01-07 ENCOUNTER — Ambulatory Visit (INDEPENDENT_AMBULATORY_CARE_PROVIDER_SITE_OTHER): Payer: Medicare HMO | Admitting: Internal Medicine

## 2023-01-07 VITALS — BP 138/64 | HR 65 | Temp 97.7°F | Ht 64.0 in | Wt 158.0 lb

## 2023-01-07 DIAGNOSIS — I10 Essential (primary) hypertension: Secondary | ICD-10-CM | POA: Diagnosis not present

## 2023-01-07 DIAGNOSIS — Z0001 Encounter for general adult medical examination with abnormal findings: Secondary | ICD-10-CM | POA: Diagnosis not present

## 2023-01-07 DIAGNOSIS — M05711 Rheumatoid arthritis with rheumatoid factor of right shoulder without organ or systems involvement: Secondary | ICD-10-CM | POA: Diagnosis not present

## 2023-01-07 DIAGNOSIS — Z23 Encounter for immunization: Secondary | ICD-10-CM

## 2023-01-07 DIAGNOSIS — L233 Allergic contact dermatitis due to drugs in contact with skin: Secondary | ICD-10-CM

## 2023-01-07 DIAGNOSIS — E785 Hyperlipidemia, unspecified: Secondary | ICD-10-CM

## 2023-01-07 DIAGNOSIS — N1832 Chronic kidney disease, stage 3b: Secondary | ICD-10-CM | POA: Diagnosis not present

## 2023-01-07 DIAGNOSIS — M05712 Rheumatoid arthritis with rheumatoid factor of left shoulder without organ or systems involvement: Secondary | ICD-10-CM | POA: Diagnosis not present

## 2023-01-07 DIAGNOSIS — M170 Bilateral primary osteoarthritis of knee: Secondary | ICD-10-CM | POA: Diagnosis not present

## 2023-01-07 DIAGNOSIS — E118 Type 2 diabetes mellitus with unspecified complications: Secondary | ICD-10-CM | POA: Diagnosis not present

## 2023-01-07 LAB — HEMOGLOBIN A1C: Hgb A1c MFr Bld: 6 % (ref 4.6–6.5)

## 2023-01-07 LAB — URINALYSIS, ROUTINE W REFLEX MICROSCOPIC
Bilirubin Urine: NEGATIVE
Hgb urine dipstick: NEGATIVE
Ketones, ur: NEGATIVE
Leukocytes,Ua: NEGATIVE
Nitrite: NEGATIVE
RBC / HPF: NONE SEEN (ref 0–?)
Specific Gravity, Urine: >=1.03 — AB (ref 1.000–1.030)
Urine Glucose: NEGATIVE
Urobilinogen, UA: 0.2 (ref 0.0–1.0)
pH: 6 (ref 5.0–8.0)

## 2023-01-07 LAB — CBC WITH DIFFERENTIAL/PLATELET
Basophils Absolute: 0.1 10*3/uL (ref 0.0–0.1)
Basophils Relative: 0.7 % (ref 0.0–3.0)
Eosinophils Absolute: 0.3 10*3/uL (ref 0.0–0.7)
Eosinophils Relative: 3.6 % (ref 0.0–5.0)
HCT: 39.5 % (ref 36.0–46.0)
Hemoglobin: 13.2 g/dL (ref 12.0–15.0)
Lymphocytes Relative: 19.6 % (ref 12.0–46.0)
Lymphs Abs: 1.4 10*3/uL (ref 0.7–4.0)
MCHC: 33.5 g/dL (ref 30.0–36.0)
MCV: 92.8 fl (ref 78.0–100.0)
Monocytes Absolute: 0.6 10*3/uL (ref 0.1–1.0)
Monocytes Relative: 8 % (ref 3.0–12.0)
Neutro Abs: 4.7 10*3/uL (ref 1.4–7.7)
Neutrophils Relative %: 68.1 % (ref 43.0–77.0)
Platelets: 175 10*3/uL (ref 150.0–400.0)
RBC: 4.25 Mil/uL (ref 3.87–5.11)
RDW: 15.6 % — ABNORMAL HIGH (ref 11.5–15.5)
WBC: 7 10*3/uL (ref 4.0–10.5)

## 2023-01-07 LAB — LIPID PANEL
Cholesterol: 192 mg/dL (ref 0–200)
HDL: 119.4 mg/dL (ref >39.00)
LDL Cholesterol: 63 mg/dL (ref 0–99)
NonHDL: 72.69
Total CHOL/HDL Ratio: 2
Triglycerides: 48 mg/dL (ref 0.0–149.0)
VLDL: 9.6 mg/dL (ref 0.0–40.0)

## 2023-01-07 LAB — BASIC METABOLIC PANEL
BUN: 17 mg/dL (ref 6–23)
CO2: 30 mEq/L (ref 19–32)
Calcium: 9.6 mg/dL (ref 8.4–10.5)
Chloride: 104 mEq/L (ref 96–112)
Creatinine, Ser: 0.92 mg/dL (ref 0.40–1.20)
GFR: 55.26 mL/min — ABNORMAL LOW (ref >60.00)
Glucose, Bld: 118 mg/dL — ABNORMAL HIGH (ref 70–99)
Potassium: 3.8 mEq/L (ref 3.5–5.1)
Sodium: 140 mEq/L (ref 135–145)

## 2023-01-07 LAB — HEPATIC FUNCTION PANEL
ALT: 41 U/L — ABNORMAL HIGH (ref 0–35)
AST: 30 U/L (ref 0–37)
Albumin: 4.3 g/dL (ref 3.5–5.2)
Alkaline Phosphatase: 115 U/L (ref 39–117)
Bilirubin, Direct: 0.2 mg/dL (ref 0.0–0.3)
Total Bilirubin: 0.9 mg/dL (ref 0.2–1.2)
Total Protein: 7.5 g/dL (ref 6.0–8.3)

## 2023-01-07 LAB — TSH: TSH: 2.12 u[IU]/mL (ref 0.35–5.50)

## 2023-01-07 LAB — MICROALBUMIN / CREATININE URINE RATIO
Creatinine,U: 199.2 mg/dL
Microalb Creat Ratio: 5.1 mg/g (ref 0.0–30.0)
Microalb, Ur: 10.1 mg/dL — ABNORMAL HIGH (ref 0.0–1.9)

## 2023-01-07 MED ORDER — TRAMADOL HCL 50 MG PO TABS: ORAL_TABLET | ORAL | 5 refills | Status: DC

## 2023-01-07 MED ORDER — HYDROXYZINE HCL 10 MG PO TABS: ORAL_TABLET | ORAL | 2 refills | Status: DC

## 2023-01-07 MED ORDER — ATENOLOL 100 MG PO TABS: 100.0000 mg | ORAL_TABLET | Freq: Every day | ORAL | 1 refills | Status: DC

## 2023-01-07 MED ORDER — AMLODIPINE BESYLATE 10 MG PO TABS: 10.0000 mg | ORAL_TABLET | Freq: Every day | ORAL | 1 refills | Status: DC

## 2023-01-07 MED ORDER — ATORVASTATIN CALCIUM 10 MG PO TABS: 10.0000 mg | ORAL_TABLET | Freq: Every day | ORAL | 1 refills | Status: DC

## 2023-01-07 NOTE — Patient Instructions (Signed)

## 2023-01-07 NOTE — Progress Notes (Signed)
Subjective:  Patient ID: Briana [name], female    DOB: 15-Jan-1931  Age: 89 y.o. MRN: [id number]  CC: Annual Exam   HPI [name] presents for a CPX and f/up --  She is active and denies DOE, SOB, edema, CP, or palpitations.  Outpatient Medications Prior to Visit  Medication Sig Dispense Refill   acetaminophen (TYLENOL) 650 MG CR tablet Take 650 mg by mouth every 8 (eight) hours as needed for pain.     b complex vitamins tablet Take 1 tablet by mouth daily.     Cholecalciferol (VITAMIN D) 2000 UNITS tablet Take 2,000 Units by mouth daily.     dorzolamide-timolol (COSOPT) 22.3-6.8 MG/ML ophthalmic solution Place 1 drop into both eyes 2 (two) times daily.     hydroxychloroquine (PLAQUENIL) 200 MG tablet Take 200 mg by mouth 2 (two) times daily.     latanoprost (XALATAN) 0.005 % ophthalmic solution Place 1 drop into both eyes at bedtime.     Netarsudil Dimesylate (RHOPRESSA) 0.02 % SOLN Place 1 drop into the right eye at bedtime.     amLODipine (NORVASC) 10 MG tablet TAKE 1 TABLET EVERY DAY 90 tablet 1   atenolol (TENORMIN) 100 MG tablet TAKE 1 TABLET EVERY DAY 90 tablet 1   atorvastatin (LIPITOR) 10 MG tablet TAKE 1 TABLET AT BEDTIME 90 tablet 10   hydrOXYzine (ATARAX) 10 MG tablet TAKE 1 TABLET(10 MG) BY MOUTH EVERY 8 HOURS AS NEEDED 30 tablet 2   traMADol (ULTRAM) 50 MG tablet TAKE 1 TABLET EVERY 12 HOURS AS NEEDED FOR MODERATE PAIN 60 tablet 5   No facility-administered medications prior to visit.    ROS Review of Systems  Constitutional: Negative.  Negative for appetite change, chills, diaphoresis, fatigue and fever.  HENT:  Positive for congestion and postnasal drip. Negative for nosebleeds, sinus pressure, sore throat, trouble swallowing and voice change.   Respiratory: Negative.  Negative for cough, chest tightness and wheezing.   Cardiovascular:  Negative for chest pain, palpitations and leg swelling.  Gastrointestinal:  Negative for abdominal pain, diarrhea, nausea  and vomiting.  Genitourinary: Negative.  Negative for difficulty urinating.  Musculoskeletal:  Positive for arthralgias, back pain and gait problem. Negative for myalgias.  Neurological:  Positive for dizziness. Negative for weakness and light-headedness.  Hematological:  Negative for adenopathy. Does not bruise/bleed easily.  Psychiatric/Behavioral: Negative.      Objective:  BP 138/64 (BP Location: Right Arm, Patient Position: Sitting, Cuff Size: Large)   Pulse 65   Temp 97.7 F (36.5 C) (Oral)   Ht 5\' 4"  (1.626 m)   Wt 158 lb (71.7 kg)   SpO2 92%   BMI 27.12 kg/m   BP Readings from Last 3 Encounters:  01/07/23 138/64  10/22/22 134/68  04/30/22 138/78    Wt Readings from Last 3 Encounters:  01/07/23 158 lb (71.7 kg)  12/10/22 165 lb (74.8 kg)  04/30/22 165 lb (74.8 kg)    Physical Exam Vitals reviewed.  HENT:     Nose: Nose normal.     Mouth/Throat:     Mouth: Mucous membranes are moist.  Eyes:     General: No scleral icterus.    Conjunctiva/sclera: Conjunctivae normal.  Cardiovascular:     Rate and Rhythm: Normal rate and regular rhythm.     Heart sounds: No murmur heard.    No friction rub. No gallop.  Pulmonary:     Effort: Pulmonary effort is normal.     Breath sounds: No stridor.  No wheezing, rhonchi or rales.  Abdominal:     General: Abdomen is flat.     Palpations: There is no mass.     Tenderness: There is no abdominal tenderness. There is no guarding.     Hernia: No hernia is present.  Musculoskeletal:        General: Deformity (DJD) present. No swelling or tenderness.     Cervical back: Neck supple.     Right lower leg: No edema.     Left lower leg: No edema.  Lymphadenopathy:     Cervical: No cervical adenopathy.  Skin:    General: Skin is warm and dry.  Neurological:     General: No focal deficit present.     Mental Status: She is alert. Mental status is at baseline.  Psychiatric:        Mood and Affect: Mood normal.        Behavior:  Behavior normal.     Lab Results  Component Value Date   WBC 7.1 04/30/2022   HGB 12.7 04/30/2022   HCT 38.1 04/30/2022   PLT 163.0 04/30/2022   GLUCOSE 120 (H) 04/30/2022   CHOL 179 01/01/2022   TRIG 68.0 01/01/2022   HDL 101.40 01/01/2022   LDLDIRECT 104.5 04/25/2010   LDLCALC 64 01/01/2022   ALT 93 (H) 01/01/2022   AST 52 (H) 01/01/2022   NA 141 04/30/2022   K 3.7 04/30/2022   CL 103 04/30/2022   CREATININE 0.95 04/30/2022   BUN 12 04/30/2022   CO2 31 04/30/2022   TSH 3.60 01/01/2022   INR 0.92 06/05/2014   HGBA1C 6.1 04/30/2022   MICROALBUR 6.7 (H) 01/01/2022    No results found.  Assessment & Plan:  Essential hypertension, benign- Her BP is well controlled. -     amLODIPine Besylate; Take 1 tablet (10 mg total) by mouth daily.  Dispense: 90 tablet; Refill: 1 -     Atenolol; Take 1 tablet (100 mg total) by mouth daily.  Dispense: 90 tablet; Refill: 1 -     TSH; Future -     Urinalysis, Routine w reflex microscopic; Future -     CBC with Differential/Platelet; Future -     Basic metabolic panel; Future  Allergic contact dermatitis due to drugs in contact with skin -     hydrOXYzine HCl; TAKE 1 TABLET(10 MG) BY MOUTH EVERY 8 HOURS AS NEEDED  Dispense: 30 tablet; Refill: 2  Primary osteoarthritis of both knees -     traMADol HCl; TAKE 1 TABLET EVERY 12 HOURS AS NEEDED FOR MODERATE PAIN  Dispense: 60 tablet; Refill: 5  Rheumatoid arthritis involving both shoulders with positive rheumatoid factor (HCC) -     traMADol HCl; TAKE 1 TABLET EVERY 12 HOURS AS NEEDED FOR MODERATE PAIN  Dispense: 60 tablet; Refill: 5  Hyperlipidemia with target LDL less than 130 - LDL goal achieved. Doing well on the statin  -     Atorvastatin Calcium; Take 1 tablet (10 mg total) by mouth at bedtime.  Dispense: 90 tablet; Refill: 1 -     Lipid panel; Future -     TSH; Future -     Hepatic function panel; Future  Encounter for general adult medical examination with abnormal findings-  Exam completed, labs reviewed, vaccines reviewed and updated, no cancer screenings indicated, patient education was given.  Type II diabetes mellitus with manifestations ([place]) - Her blood sugar is well controlled. -     HM Diabetes Foot Exam -  Hemoglobin A1c; Future -     Basic metabolic panel; Future -     Microalbumin / creatinine urine ratio; Future  Stage 3b chronic kidney disease ([place])- Renal function is stable. -     Urinalysis, Routine w reflex microscopic; Future -     Basic metabolic panel; Future -     Microalbumin / creatinine urine ratio; Future     Follow-up: No follow-ups on file.  Scarlette Calico, MD

## 2023-01-10 MED ORDER — BOOSTRIX 5-2.5-18.5 LF-MCG/0.5 IM SUSP: 0.5000 mL | Freq: Once | INTRAMUSCULAR | 0 refills | Status: AC

## 2023-02-22 ENCOUNTER — Other Ambulatory Visit: Payer: Self-pay | Admitting: Internal Medicine

## 2023-02-22 DIAGNOSIS — E785 Hyperlipidemia, unspecified: Secondary | ICD-10-CM

## 2023-03-03 ENCOUNTER — Encounter: Payer: Self-pay | Admitting: Emergency Medicine

## 2023-03-03 ENCOUNTER — Ambulatory Visit (INDEPENDENT_AMBULATORY_CARE_PROVIDER_SITE_OTHER): Payer: Medicare HMO | Admitting: Emergency Medicine

## 2023-03-03 ENCOUNTER — Ambulatory Visit (INDEPENDENT_AMBULATORY_CARE_PROVIDER_SITE_OTHER): Payer: Medicare HMO

## 2023-03-03 VITALS — BP 128/86 | HR 55 | Temp 98.2°F | Ht 64.0 in | Wt 157.5 lb

## 2023-03-03 DIAGNOSIS — M19032 Primary osteoarthritis, left wrist: Secondary | ICD-10-CM | POA: Insufficient documentation

## 2023-03-03 DIAGNOSIS — M25532 Pain in left wrist: Secondary | ICD-10-CM | POA: Insufficient documentation

## 2023-03-03 DIAGNOSIS — L233 Allergic contact dermatitis due to drugs in contact with skin: Secondary | ICD-10-CM | POA: Diagnosis not present

## 2023-03-03 MED ORDER — PREDNISONE 20 MG PO TABS: 20.0000 mg | ORAL_TABLET | Freq: Every day | ORAL | 0 refills | Status: AC

## 2023-03-03 MED ORDER — HYDROXYZINE HCL 10 MG PO TABS: ORAL_TABLET | ORAL | 2 refills | Status: DC

## 2023-03-03 NOTE — Assessment & Plan Note (Signed)
As seen on x-ray report done today Pain management discussed Recommend to start prednisone 20 mg daily for 5 days Immobilization not recommended at this time Advised to contact the office if no better or worse during the next several days

## 2023-03-03 NOTE — Assessment & Plan Note (Signed)
Active and affecting quality of life Recent onset.  No signs of cellulitis No identifiable trauma Reactive arthritis. Recommend to start 20 mg of prednisone daily for 5 days Allergic to NSAIDs May take Tylenol and or tramadol as needed for pain Advised to contact the office if no better or worse during the next several days

## 2023-03-03 NOTE — Progress Notes (Signed)
Briana [name] 89 y.o.   Chief Complaint  Patient presents with   Wrist Pain    Left wrist pain, started Saturday, patient states she has not hurt it in anyway, the only thing she thinks is putting too much weight on it when getting up out the chair.     HISTORY OF PRESENT ILLNESS: Acute problem visit today.  Patient of Dr. Sanda Linger This is a 89 y.o. female complaining of pain to left wrist that started 4 to 5 days ago.  Accompanied by daughter today. Denies injury or any other associated symptoms. No other complaints or medical concerns today.  Wrist Pain  Pertinent negatives include no fever.     Prior to Admission medications   Medication Sig Start Date End Date Taking? Authorizing Provider  acetaminophen (TYLENOL) 650 MG CR tablet Take 650 mg by mouth every 8 (eight) hours as needed for pain.   Yes [provider]  amLODipine (NORVASC) 10 MG tablet Take 1 tablet (10 mg total) by mouth daily. 01/07/23  Yes Etta Grandchild, MD  atenolol (TENORMIN) 100 MG tablet Take 1 tablet (100 mg total) by mouth daily. 01/07/23  Yes Etta Grandchild, MD  atorvastatin (LIPITOR) 10 MG tablet TAKE 1 TABLET EVERY DAY 02/22/23  Yes Etta Grandchild, MD  b complex vitamins tablet Take 1 tablet by mouth daily.   Yes [provider]  Cholecalciferol (VITAMIN D) 2000 UNITS tablet Take 2,000 Units by mouth daily.   Yes [provider]  dorzolamide-timolol (COSOPT) 22.3-6.8 MG/ML ophthalmic solution Place 1 drop into both eyes 2 (two) times daily.   Yes [provider]  hydroxychloroquine (PLAQUENIL) 200 MG tablet Take 200 mg by mouth 2 (two) times daily.   Yes Zenovia Jordan, MD  hydrOXYzine (ATARAX) 10 MG tablet TAKE 1 TABLET(10 MG) BY MOUTH EVERY 8 HOURS AS NEEDED 01/07/23  Yes Etta Grandchild, MD  latanoprost (XALATAN) 0.005 % ophthalmic solution Place 1 drop into both eyes at bedtime.   Yes [provider]  Netarsudil Dimesylate (RHOPRESSA) 0.02 % SOLN Place  1 drop into the right eye at bedtime.   Yes [provider]  traMADol (ULTRAM) 50 MG tablet TAKE 1 TABLET EVERY 12 HOURS AS NEEDED FOR MODERATE PAIN 01/07/23  Yes Etta Grandchild, MD    Allergies  Allergen Reactions   Ibuprofen Anaphylaxis   Percocet [Oxycodone-Acetaminophen]     nausea   Simvastatin Other (See Comments)    muscle aches   2,4-D Dimethylamine Other (See Comments)   Diltiazem Hcl     REACTION: ANGIO EDEMA   Hydrochlorothiazide     REACTION: SWELLING   Lisinopril     REACTION: SWELLING   Valacyclovir Other (See Comments)   Verapamil     REACTION: SWELLING    Patient Active Problem List   Diagnosis Date Noted   Allergic contact dermatitis due to drugs in contact with skin 04/30/2022   Abnormal electrocardiogram (ECG) (EKG) 04/30/2022   Elevated troponin 04/30/2022   Body mass index (BMI) 31.0-31.9, adult 11/21/2021   Other long term (current) drug therapy 11/21/2021   Sciatica 11/21/2021   Stage 3b chronic kidney disease (HCC) 05/30/2020   Type II diabetes mellitus with manifestations (HCC) 12/28/2019   Vertigo, benign paroxysmal, bilateral 08/15/2019   Left lumbar radiculitis 03/07/2017   Essential hypertension, benign 06/19/2014   RA (rheumatoid arthritis) (HCC) 06/19/2014   Constipation due to opioid therapy 06/19/2014   Encounter for general adult medical examination with abnormal  findings 05/30/2012   INSOMNIA UNSPECIFIED 10/31/2007   Hyperlipidemia with target LDL less than 130 10/28/2007   Seasonal allergic rhinitis 10/28/2007   DJD (degenerative joint disease) of knee 10/28/2007    Past Medical History:  Diagnosis Date   ACUTE URIS OF UNSPECIFIED SITE 11/26/2008   ALLERGIC ARTHRITIS OTHER SPECIFIED SITES 10/22/2010   OA + RA   ALLERGIC RHINITIS 10/28/2007   ARTHRITIS, KNEES, BILATERAL 10/28/2007   BACK PAIN 04/12/2009   BUNIONECTOMY, HX OF 10/28/2007   CELLULITIS&ABSCESS OF HAND EXCEPT FINGERS&THUMB 10/23/2010   Complication of anesthesia     itching and makes me crazy   Family history of anesthesia complication    DAUGHTER HAD HIVES AND " WENT WILD "   HYPERLIPIDEMIA 10/28/2007   HYPERTENSION 10/28/2007   INSOMNIA UNSPECIFIED 10/31/2007   MYOSITIS 10/22/2010   NECK PAIN 12/03/2008   Pain in joint, site unspecified 10/07/2010   WRIST PAIN, LEFT 10/22/2010    Past Surgical History:  Procedure Laterality Date   arthroscopic surgery and rotator cuff repair left shoulder     arthroscopy right knee hx of surgery     BUNIONECTOMY     hx of   FOOT SURGERY     RIGHT   hand surgery for broken finger, remote     JOINT REPLACEMENT Right    knee   TOTAL KNEE ARTHROPLASTY  07/01/2012   Procedure: TOTAL KNEE ARTHROPLASTY;  Surgeon: Thera Flake., MD;  Location: [facility] OR;  Service: Orthopedics;  Laterality: Right;  right total knee arthroplasty   TOTAL KNEE ARTHROPLASTY Left 06/13/2014   dr Eulah Pont   TOTAL KNEE ARTHROPLASTY Left 06/13/2014   Procedure: TOTAL KNEE ARTHROPLASTY;  Surgeon: Loreta Ave, MD;  Location: [facility] OR;  Service: Orthopedics;  Laterality: Left;    Social History   Socioeconomic History   Marital status: Widowed    Spouse name: Not on file   Number of children: 4   Years of education: 12   Highest education level: Not on file  Occupational History   Occupation: ASSN II 1    Employer: [employer]  Tobacco Use   Smoking status: Never   Smokeless tobacco: Never  Vaping Use   Vaping Use: Never used  Substance and Sexual Activity   Alcohol use: No   Drug use: No   Sexual activity: Not Currently  Other Topics Concern   Not on file  Social History Narrative   Married in [place] widowed on 00   4 sons, 1 daughter, 6 grandchildren, 3 great-grands   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   Social Determinants of Health   Financial Resource Strain: Low Risk  (12/10/2022)   Overall Financial Resource Strain (CARDIA)    Difficulty of Paying  Living Expenses: Not hard at all  Food Insecurity: No Food Insecurity (12/10/2022)   Hunger Vital Sign    Worried About Running Out of Food in the Last Year: Never true    Ran Out of Food in the Last Year: Never true  Transportation Needs: No Transportation Needs (12/10/2022)   PRAPARE - Administrator, Civil Service (Medical): No    Lack of Transportation (Non-Medical): No  Physical Activity: Insufficiently Active (12/10/2022)   Exercise Vital Sign    Days of Exercise per Week: 7 days    Minutes of Exercise per Session: 10 min  Stress: No Stress Concern Present (12/10/2022)  [employer] of Occupational Health - Occupational Stress Questionnaire    Feeling of Stress : Only a little  Social Connections: Moderately Integrated (12/10/2022)   Social Connection and Isolation Panel [NHANES]    Frequency of Communication with Friends and Family: More than three times a week    Frequency of Social Gatherings with Friends and Family: More than three times a week    Attends Religious Services: More than 4 times per year    Active Member of [employer] or Organizations: Yes    Attends Banker Meetings: More than 4 times per year    Marital Status: Widowed  Intimate Partner Violence: Not At Risk (12/10/2022)   Humiliation, Afraid, Rape, and Kick questionnaire    Fear of Current or Ex-Partner: No    Emotionally Abused: No    Physically Abused: No    Sexually Abused: No    Family History  Problem Relation Age of Onset   Cancer Brother        lung cancer   Coronary artery disease Other    Heart attack Other    Cancer Sister        Lung, stomach     Review of Systems  Constitutional: Negative.  Negative for chills and fever.  HENT: Negative.  Negative for congestion and sore throat.   Respiratory: Negative.  Negative for cough and shortness of breath.   Cardiovascular:  Negative for chest pain and palpitations.  Gastrointestinal:  Negative for abdominal pain,  diarrhea, nausea and vomiting.  Genitourinary: Negative.  Negative for dysuria and hematuria.  Musculoskeletal:  Positive for joint pain (Left wrist).  Skin: Negative.  Negative for rash.  Neurological: Negative.  Negative for dizziness and headaches.  All other systems reviewed and are negative.   Vitals:   03/03/23 1017  BP: 128/86  Pulse: (!) 55  Temp: 98.2 F (36.8 C)  SpO2: 98%    Physical Exam Vitals reviewed.  Constitutional:      Appearance: Normal appearance.  HENT:     Head: Normocephalic.  Eyes:     Extraocular Movements: Extraocular movements intact.  Cardiovascular:     Rate and Rhythm: Normal rate.  Pulmonary:     Effort: Pulmonary effort is normal.  Musculoskeletal:     Comments: Left wrist: Positive swelling with erythema and painful range of motion Clearly intact.  Good pulses and good distal sensation to the fingers.  Skin:    General: Skin is warm and dry.  Neurological:     Mental Status: She is alert and oriented to person, place, and time.  Psychiatric:        Mood and Affect: Mood normal.        Behavior: Behavior normal.    DG Wrist Complete Left  Result Date: 03/03/2023 CLINICAL DATA:  Left wrist pain EXAM: LEFT WRIST - COMPLETE 3+ VIEW COMPARISON:  X-ray left wrist 04/02/2015 FINDINGS: There is no evidence of fracture or dislocation. Chondrocalcinosis with associated moderate ulnocarpal and radiocarpal joint space narrowing. Mild carpal joint space degenerative changes. At least mild first digit carpometacarpal joint degenerative changes. Soft tissues are unremarkable. IMPRESSION: 1. No acute displaced fracture or dislocation. 2. Arthropathy as above. Electronically Signed   By: Tish Frederickson M.D.   On: 03/03/2023 11:25     ASSESSMENT & PLAN: Problem List Items Addressed This Visit       Musculoskeletal and Integument   Allergic contact dermatitis due to drugs in contact with skin   Relevant Medications  hydrOXYzine (ATARAX) 10 MG  tablet   Arthropathy of left wrist    As seen on x-ray report done today Pain management discussed Recommend to start prednisone 20 mg daily for 5 days Immobilization not recommended at this time Advised to contact the office if no better or worse during the next several days      Relevant Orders   DG Wrist Complete Left (Completed)     Other   Left wrist pain - Primary    Active and affecting quality of life Recent onset.  No signs of cellulitis No identifiable trauma Reactive arthritis. Recommend to start 20 mg of prednisone daily for 5 days Allergic to NSAIDs May take Tylenol and or tramadol as needed for pain Advised to contact the office if no better or worse during the next several days      Relevant Medications   predniSONE (DELTASONE) 20 MG tablet   Other Relevant Orders   DG Wrist Complete Left (Completed)   Patient Instructions  Wrist Pain, Adult There are many things that can cause wrist pain. Some common causes include: An injury to the wrist. Using the joint too much. A condition that causes too much pressure to be put on a nerve in the wrist (carpal tunnel syndrome). Wear and tear of the joints that happens as a person gets older (osteoarthritis). A condition that causes swelling and stiffness in the joints (arthritis). Sometimes, the cause of wrist pain is not known. Often, the pain goes away when you follow your doctor's instructions for easing pain at home. This may include resting your wrist, icing your wrist, or using a splint or an elastic wrap for a short time. It is important to tell your doctor if your wrist pain does not go away. Follow these instructions at home: If you have a splint or elastic wrap that can be taken off: Wear the splint or wrap as told by your doctor. Take it off only as told by your doctor. Ask if you can take it off for bathing. Check the skin around the splint or wrap every day. Tell your doctor if you see problems. Loosen the  splint or wrap if your fingers: Tingle. Become numb. Turn cold and blue. Keep the splint or wrap clean. If the splint or wrap is not waterproof: Do not let it get wet. Cover it with a watertight covering when you take a bath or shower. Managing pain, stiffness, and swelling  If told, put ice on the painful area. If you have a removable splint or wrap, take it off as told by your doctor. Put ice in a plastic bag. Place a towel between your skin and the bag or between your splint or wrap and the bag. Leave the ice on for 20 minutes, 2-3 times a day. If your skin turns bright red, take off the ice right away to prevent skin damage. The risk of damage is higher if you cannot feel pain, heat, or cold. Move your fingers often. Raise the injured area above the level of your heart while you are sitting or lying down. Activity Rest your wrist as told by your doctor. Return to your normal activities when your doctor says that it is safe. Ask your doctor when it is safe to drive if you have a splint or wrap on your wrist. Do exercises as told by your doctor. General instructions Pay attention to any changes in your symptoms. Take over-the-counter and prescription medicines only as told by your  doctor. Contact a doctor if: You have a sudden, sharp pain in the wrist, hand, or arm that is different or new. Any swelling or bruising on your wrist or hand gets worse. Your skin: Becomes red. Has a rash. Has open sores. Your pain does not get better. Your pain gets worse. You have a fever or chills. Get help right away if: You lose feeling in your fingers or hand. Your fingers turn white, very red, or cold and blue. You cannot move your fingers. This information is not intended to replace advice given to you by your health care provider. Make sure you discuss any questions you have with your health care provider. Document Revised: 07/03/2022 Document Reviewed: 07/03/2022 Elsevier Patient  Education  2023 Elsevier Inc.     Edwina Barth, MD [facility]

## 2023-03-03 NOTE — Patient Instructions (Signed)
Wrist Pain, Adult There are many things that can cause wrist pain. Some common causes include: An injury to the wrist. Using the joint too much. A condition that causes too much pressure to be put on a nerve in the wrist (carpal tunnel syndrome). Wear and tear of the joints that happens as a person gets older (osteoarthritis). A condition that causes swelling and stiffness in the joints (arthritis). Sometimes, the cause of wrist pain is not known. Often, the pain goes away when you follow your doctor's instructions for easing pain at home. This may include resting your wrist, icing your wrist, or using a splint or an elastic wrap for a short time. It is important to tell your doctor if your wrist pain does not go away. Follow these instructions at home: If you have a splint or elastic wrap that can be taken off: Wear the splint or wrap as told by your doctor. Take it off only as told by your doctor. Ask if you can take it off for bathing. Check the skin around the splint or wrap every day. Tell your doctor if you see problems. Loosen the splint or wrap if your fingers: Tingle. Become numb. Turn cold and blue. Keep the splint or wrap clean. If the splint or wrap is not waterproof: Do not let it get wet. Cover it with a watertight covering when you take a bath or shower. Managing pain, stiffness, and swelling  If told, put ice on the painful area. If you have a removable splint or wrap, take it off as told by your doctor. Put ice in a plastic bag. Place a towel between your skin and the bag or between your splint or wrap and the bag. Leave the ice on for 20 minutes, 2-3 times a day. If your skin turns bright red, take off the ice right away to prevent skin damage. The risk of damage is higher if you cannot feel pain, heat, or cold. Move your fingers often. Raise the injured area above the level of your heart while you are sitting or lying down. Activity Rest your wrist as told by your  doctor. Return to your normal activities when your doctor says that it is safe. Ask your doctor when it is safe to drive if you have a splint or wrap on your wrist. Do exercises as told by your doctor. General instructions Pay attention to any changes in your symptoms. Take over-the-counter and prescription medicines only as told by your doctor. Contact a doctor if: You have a sudden, sharp pain in the wrist, hand, or arm that is different or new. Any swelling or bruising on your wrist or hand gets worse. Your skin: Becomes red. Has a rash. Has open sores. Your pain does not get better. Your pain gets worse. You have a fever or chills. Get help right away if: You lose feeling in your fingers or hand. Your fingers turn white, very red, or cold and blue. You cannot move your fingers. This information is not intended to replace advice given to you by your health care provider. Make sure you discuss any questions you have with your health care provider. Document Revised: 07/03/2022 Document Reviewed: 07/03/2022 Elsevier Patient Education  2023 Elsevier Inc.  

## 2023-05-03 DIAGNOSIS — H401113 Primary open-angle glaucoma, right eye, severe stage: Secondary | ICD-10-CM | POA: Diagnosis not present

## 2023-05-13 ENCOUNTER — Encounter: Payer: Self-pay | Admitting: Internal Medicine

## 2023-05-13 ENCOUNTER — Ambulatory Visit (INDEPENDENT_AMBULATORY_CARE_PROVIDER_SITE_OTHER): Payer: Medicare HMO

## 2023-05-13 ENCOUNTER — Ambulatory Visit (INDEPENDENT_AMBULATORY_CARE_PROVIDER_SITE_OTHER): Payer: Medicare HMO | Admitting: Internal Medicine

## 2023-05-13 VITALS — BP 118/64 | HR 60 | Temp 98.4°F | Resp 16 | Ht 64.0 in

## 2023-05-13 DIAGNOSIS — U071 COVID-19: Secondary | ICD-10-CM | POA: Diagnosis not present

## 2023-05-13 DIAGNOSIS — J069 Acute upper respiratory infection, unspecified: Secondary | ICD-10-CM

## 2023-05-13 DIAGNOSIS — R059 Cough, unspecified: Secondary | ICD-10-CM | POA: Diagnosis not present

## 2023-05-13 DIAGNOSIS — R058 Other specified cough: Secondary | ICD-10-CM

## 2023-05-13 DIAGNOSIS — J208 Acute bronchitis due to other specified organisms: Secondary | ICD-10-CM | POA: Insufficient documentation

## 2023-05-13 DIAGNOSIS — I517 Cardiomegaly: Secondary | ICD-10-CM | POA: Diagnosis not present

## 2023-05-13 LAB — POCT INFLUENZA A/B
Influenza A, POC: NEGATIVE
Influenza B, POC: NEGATIVE

## 2023-05-13 LAB — POC COVID19 BINAXNOW: SARS Coronavirus 2 Ag: POSITIVE — AB

## 2023-05-13 MED ORDER — NIRMATRELVIR/RITONAVIR (PAXLOVID) TABLET (RENAL DOSING)
2.0000 | ORAL_TABLET | Freq: Two times a day (BID) | ORAL | 0 refills | Status: AC
Start: 2023-05-13 — End: 2023-05-18

## 2023-05-13 MED ORDER — PROMETHAZINE HCL 12.5 MG PO TABS
12.5000 mg | ORAL_TABLET | Freq: Four times a day (QID) | ORAL | 0 refills | Status: DC | PRN
Start: 2023-05-13 — End: 2023-05-26

## 2023-05-13 NOTE — Progress Notes (Signed)
Subjective:  Patient ID: Briana [name], female    DOB: [birth year]  Age: 89 y.o. MRN: [id number]  CC: URI   HPI Briana [name] presents for f/up ----  Discussed the use of AI scribe software for clinical note transcription with the patient, who gave verbal consent to proceed.  History of Present Illness   The patient, with a recent positive home COVID-19 test, presents with a two-day history of upper respiratory symptoms. They initially noticed a runny nose and cough, which was followed by the onset of chills. The cough is productive, with the patient describing the sputum as dark, a mix of brown and yellow. Despite these symptoms, the patient denies experiencing shortness of breath. They have received a COVID-19 vaccine in the past. The patient sought medical attention due to the persistence and progression of these symptoms.        ROS Review of Systems  Constitutional:  Positive for chills and fatigue. Negative for fever.  HENT:  Positive for postnasal drip, rhinorrhea and sore throat. Negative for nosebleeds and sinus pressure.   Eyes:  Negative for visual disturbance.  Respiratory:  Positive for cough. Negative for chest tightness, shortness of breath and wheezing.   Cardiovascular:  Negative for chest pain, palpitations and leg swelling.  Gastrointestinal:  Negative for abdominal pain, diarrhea, nausea and vomiting.  Genitourinary: Negative.  Negative for difficulty urinating.  Musculoskeletal: Negative.  Negative for arthralgias and myalgias.  Skin: Negative.  Negative for color change, pallor and rash.  Neurological: Negative.  Negative for dizziness and light-headedness.  Hematological:  Negative for adenopathy. Does not bruise/bleed easily.  Psychiatric/Behavioral:  Positive for confusion. Negative for sleep disturbance. The patient is not nervous/anxious.     Objective:  BP 118/64 (BP Location: Right Arm, Patient Position: Sitting, Cuff Size: Large)   Pulse 60    Temp 98.4 F (36.9 C) (Oral)   Resp 16   Ht 5\' 4"  (1.626 m)   SpO2 95%   BMI 27.03 kg/m   BP Readings from Last 3 Encounters:  05/13/23 118/64  03/03/23 128/86  01/07/23 138/64    Wt Readings from Last 3 Encounters:  03/03/23 157 lb 8 oz (71.4 kg)  01/07/23 158 lb (71.7 kg)  12/10/22 165 lb (74.8 kg)    Physical Exam Vitals reviewed.  Constitutional:      Appearance: She is not ill-appearing.  HENT:     Mouth/Throat:     Mouth: Mucous membranes are moist.  Eyes:     General: No scleral icterus.    Conjunctiva/sclera: Conjunctivae normal.  Cardiovascular:     Rate and Rhythm: Normal rate and regular rhythm.     Heart sounds: No murmur heard.    No friction rub. No gallop.  Pulmonary:     Effort: Pulmonary effort is normal.     Breath sounds: No stridor. No decreased breath sounds, wheezing, rhonchi or rales.  Abdominal:     General: Abdomen is flat.     Palpations: There is no mass.     Tenderness: There is no abdominal tenderness. There is no guarding.     Hernia: No hernia is present.  Musculoskeletal:     Cervical back: Neck supple.     Right lower leg: No edema.     Left lower leg: No edema.  Lymphadenopathy:     Cervical: No cervical adenopathy.  Skin:    General: Skin is warm.     Findings: No lesion or rash.  Neurological:  General: No focal deficit present.     Mental Status: She is alert. Mental status is at baseline.  Psychiatric:        Mood and Affect: Mood normal.        Behavior: Behavior normal.     Lab Results  Component Value Date   WBC 7.0 01/07/2023   HGB 13.2 01/07/2023   HCT 39.5 01/07/2023   PLT 175.0 01/07/2023   GLUCOSE 118 (H) 01/07/2023   CHOL 192 01/07/2023   TRIG 48.0 01/07/2023   HDL 119.40 01/07/2023   LDLDIRECT 104.5 04/25/2010   LDLCALC 63 01/07/2023   ALT 41 (H) 01/07/2023   AST 30 01/07/2023   NA 140 01/07/2023   K 3.8 01/07/2023   CL 104 01/07/2023   CREATININE 0.92 01/07/2023   BUN 17 01/07/2023   CO2  30 01/07/2023   TSH 2.12 01/07/2023   INR 0.92 06/05/2014   HGBA1C 6.0 01/07/2023   MICROALBUR 10.1 (H) 01/07/2023    DG Chest 2 View  Result Date: 05/13/2023 CLINICAL DATA:  Cough x2 days, COVID positive EXAM: CHEST - 2 VIEW COMPARISON:  08/07/2021 FINDINGS: Transverse diameter of heart is increased. There are no signs of pulmonary edema or focal pulmonary consolidation. There is no pleural effusion or pneumothorax. There is mild dextroscoliosis in thoracic spine. IMPRESSION: Cardiomegaly. There are no signs of pulmonary edema or focal pulmonary consolidation. There is no pleural effusion or pneumothorax. Electronically Signed   By: Ernie Avena M.D.   On: 05/13/2023 14:13     Assessment & Plan:    Cough productive of purulent sputum- Chest x-ray is negative for infiltrate. -     DG Chest 2 View; Future -     POCT Influenza A/B -     POC COVID-19 BinaxNow -     Promethazine HCl; Take 1 tablet (12.5 mg total) by mouth every 6 (six) hours as needed for nausea or vomiting.  Dispense: 30 tablet; Refill: 0  Viral upper respiratory tract infection -     POCT Influenza A/B -     POC COVID-19 BinaxNow -     Promethazine HCl; Take 1 tablet (12.5 mg total) by mouth every 6 (six) hours as needed for nausea or vomiting.  Dispense: 30 tablet; Refill: 0  Acute bronchitis due to COVID-19 virus -     nirmatrelvir/ritonavir (renal dosing); Take 2 tablets by mouth 2 (two) times daily for 5 days. (Take nirmatrelvir 150 mg one tablet twice daily for 5 days and ritonavir 100 mg one tablet twice daily for 5 days) Patient GFR is 55  Dispense: 20 tablet; Refill: 0 -     Promethazine HCl; Take 1 tablet (12.5 mg total) by mouth every 6 (six) hours as needed for nausea or vomiting.  Dispense: 30 tablet; Refill: 0     Follow-up: Return in about 3 weeks (around 06/03/2023).  Sanda Linger, MD

## 2023-05-13 NOTE — Patient Instructions (Signed)

## 2023-05-17 ENCOUNTER — Telehealth: Payer: Self-pay | Admitting: Internal Medicine

## 2023-05-17 NOTE — Telephone Encounter (Signed)
Pt's daughter, Briana [name], stated that pt is confused as pt is unable to recognize her children. Briana [name] stated that the confusion started yesterday 8/5. Daughter is wondering if that is possibly a side effect of the Paxlovid that she was prescribed on 8/1. Please advise.

## 2023-05-17 NOTE — Telephone Encounter (Signed)
Patient's daughter called and said that patient is very confused and not feeling well.  She is on the paxlovid.  Please call daugter at:  [phone]  [facility])

## 2023-05-17 NOTE — Telephone Encounter (Signed)
If she can

## 2023-05-18 NOTE — Telephone Encounter (Signed)
Patient's daughter wants to know if she should come back in the office since the symptoms are worse.  Daughter would like someone to call her back and let her know so she will know what to do.  [phone]  [facility]

## 2023-05-18 NOTE — Telephone Encounter (Signed)
Pt has been scheduled for tomorrow 8/7 @ 1.40pm.

## 2023-05-18 NOTE — Telephone Encounter (Signed)
Pt's daughter has been informed of PCPs responses. However she is wondering is pt needs to come back in for a follow up appt and additional lab work.   Please advise.

## 2023-05-19 ENCOUNTER — Ambulatory Visit: Payer: Medicare HMO | Admitting: Internal Medicine

## 2023-05-19 ENCOUNTER — Encounter: Payer: Self-pay | Admitting: Internal Medicine

## 2023-05-19 VITALS — BP 132/74 | HR 62 | Temp 98.4°F | Resp 16 | Ht 64.0 in | Wt 156.0 lb

## 2023-05-19 DIAGNOSIS — J208 Acute bronchitis due to other specified organisms: Secondary | ICD-10-CM

## 2023-05-19 DIAGNOSIS — U071 COVID-19: Secondary | ICD-10-CM | POA: Diagnosis not present

## 2023-05-19 DIAGNOSIS — R9431 Abnormal electrocardiogram [ECG] [EKG]: Secondary | ICD-10-CM | POA: Diagnosis not present

## 2023-05-19 DIAGNOSIS — I10 Essential (primary) hypertension: Secondary | ICD-10-CM

## 2023-05-19 NOTE — Progress Notes (Signed)
Subjective:  Patient ID: Briana [name], female    DOB: 10-Sep-1931  Age: 89 y.o. MRN: [id number]  CC: Hypertension and URI   HPI Briana [name] presents for f/up ----  Discussed the use of AI scribe software for clinical note transcription with the patient, who gave verbal consent to proceed.  History of Present Illness   The patient, with a history of COVID-19, presents with complaints of cognitive impairment, described as an inability to function mentally, particularly with tasks such as counting money. This is a new symptom for the patient, who has had COVID-19 once before without experiencing similar issues. The patient completed a course of Paxlovid following his recent COVID-19 diagnosis.  The patient reports the cough has resolved. She denies fever, chills, night sweats, abdominal pain, nausea, and vomiting. The patient has been taking an unspecified medication for cough, obtained from [facility].  The patient also reports musculoskeletal pain, for which he took a pain pill on the day of the consultation. He denies any symptoms of dehydration, chest pain, or shortness of breath. However, he reports a lack of appetite.  The patient lives with his son, who provides care. She expresses a preference for avoiding hospitalization. The patient's last EKG was performed a significant time ago, and she consented to a new EKG during the consultation to assess for potential cardiac issues.       Outpatient Medications Prior to Visit  Medication Sig Dispense Refill   acetaminophen (TYLENOL) 650 MG CR tablet Take 650 mg by mouth every 8 (eight) hours as needed for pain.     amLODipine (NORVASC) 10 MG tablet Take 1 tablet (10 mg total) by mouth daily. 90 tablet 1   atenolol (TENORMIN) 100 MG tablet Take 1 tablet (100 mg total) by mouth daily. 90 tablet 1   atorvastatin (LIPITOR) 10 MG tablet TAKE 1 TABLET EVERY DAY 90 tablet 1   b complex vitamins tablet Take 1 tablet by mouth daily.      Cholecalciferol (VITAMIN D) 2000 UNITS tablet Take 2,000 Units by mouth daily.     dorzolamide-timolol (COSOPT) 22.3-6.8 MG/ML ophthalmic solution Place 1 drop into both eyes 2 (two) times daily.     hydroxychloroquine (PLAQUENIL) 200 MG tablet Take 200 mg by mouth 2 (two) times daily.     hydrOXYzine (ATARAX) 10 MG tablet TAKE 1 TABLET(10 MG) BY MOUTH EVERY 8 HOURS AS NEEDED 30 tablet 2   latanoprost (XALATAN) 0.005 % ophthalmic solution Place 1 drop into both eyes at bedtime.     Netarsudil Dimesylate (RHOPRESSA) 0.02 % SOLN Place 1 drop into the right eye at bedtime.     promethazine (PHENERGAN) 12.5 MG tablet Take 1 tablet (12.5 mg total) by mouth every 6 (six) hours as needed for nausea or vomiting. 30 tablet 0   traMADol (ULTRAM) 50 MG tablet TAKE 1 TABLET EVERY 12 HOURS AS NEEDED FOR MODERATE PAIN 60 tablet 5   No facility-administered medications prior to visit.    ROS Review of Systems  Objective:  BP 132/74 (BP Location: Left Arm, Patient Position: Sitting, Cuff Size: Large)   Pulse 62   Temp 98.4 F (36.9 C) (Oral)   Resp 16   Ht 5\' 4"  (1.626 m)   Wt 156 lb (70.8 kg)   SpO2 95%   BMI 26.78 kg/m   BP Readings from Last 3 Encounters:  05/19/23 132/74  05/13/23 118/64  03/03/23 128/86    Wt Readings from Last 3 Encounters:  05/19/23 156  lb (70.8 kg)  03/03/23 157 lb 8 oz (71.4 kg)  01/07/23 158 lb (71.7 kg)    Physical Exam Cardiovascular:     Comments: EKG- NSR, 63 bpm LAD Minimal LVH Septal infarct pattern - old unchanged Musculoskeletal:     Right lower leg: No edema.     Left lower leg: No edema.     Lab Results  Component Value Date   WBC 7.0 01/07/2023   HGB 13.2 01/07/2023   HCT 39.5 01/07/2023   PLT 175.0 01/07/2023   GLUCOSE 118 (H) 01/07/2023   CHOL 192 01/07/2023   TRIG 48.0 01/07/2023   HDL 119.40 01/07/2023   LDLDIRECT 104.5 04/25/2010   LDLCALC 63 01/07/2023   ALT 41 (H) 01/07/2023   AST 30 01/07/2023   NA 140 01/07/2023   K  3.8 01/07/2023   CL 104 01/07/2023   CREATININE 0.92 01/07/2023   BUN 17 01/07/2023   CO2 30 01/07/2023   TSH 2.12 01/07/2023   INR 0.92 06/05/2014   HGBA1C 6.0 01/07/2023   MICROALBUR 10.1 (H) 01/07/2023    No results found.  Assessment & Plan:   Essential hypertension, benign- Her blood pressure is well-controlled. -     EKG 12-Lead  Abnormal electrocardiogram (ECG) (EKG)- Her EKG is unchanged. -     EKG 12-Lead  Acute bronchitis due to COVID-19 virus- The infection has been treated but she has post-COVID symptoms.  I recommended supportive care.     Follow-up: Return in about 6 months (around 11/19/2023).  Sanda Linger, MD

## 2023-05-19 NOTE — Patient Instructions (Signed)
Hypertension, Adult High blood pressure (hypertension) is when the force of blood pumping through the arteries is too strong. The arteries are the blood vessels that carry blood from the heart throughout the body. Hypertension forces the heart to work harder to pump blood and may cause arteries to become narrow or stiff. Untreated or uncontrolled hypertension can lead to a heart attack, heart failure, a stroke, kidney disease, and other problems. A blood pressure reading consists of a higher number over a lower number. Ideally, your blood pressure should be below 120/80. The first ("top") number is called the systolic pressure. It is a measure of the pressure in your arteries as your heart beats. The second ("bottom") number is called the diastolic pressure. It is a measure of the pressure in your arteries as the heart relaxes. What are the causes? The exact cause of this condition is not known. There are some conditions that result in high blood pressure. What increases the risk? Certain factors may make you more likely to develop high blood pressure. Some of these risk factors are under your control, including: Smoking. Not getting enough exercise or physical activity. Being overweight. Having too much fat, sugar, calories, or salt (sodium) in your diet. Drinking too much alcohol. Other risk factors include: Having a personal history of heart disease, diabetes, high cholesterol, or kidney disease. Stress. Having a family history of high blood pressure and high cholesterol. Having obstructive sleep apnea. Age. The risk increases with age. What are the signs or symptoms? High blood pressure may not cause symptoms. Very high blood pressure (hypertensive crisis) may cause: Headache. Fast or irregular heartbeats (palpitations). Shortness of breath. Nosebleed. Nausea and vomiting. Vision changes. Severe chest pain, dizziness, and seizures. How is this diagnosed? This condition is diagnosed by  measuring your blood pressure while you are seated, with your arm resting on a flat surface, your legs uncrossed, and your feet flat on the floor. The cuff of the blood pressure monitor will be placed directly against the skin of your upper arm at the level of your heart. Blood pressure should be measured at least twice using the same arm. Certain conditions can cause a difference in blood pressure between your right and left arms. If you have a high blood pressure reading during one visit or you have normal blood pressure with other risk factors, you may be asked to: Return on a different day to have your blood pressure checked again. Monitor your blood pressure at home for 1 week or longer. If you are diagnosed with hypertension, you may have other blood or imaging tests to help your health care provider understand your overall risk for other conditions. How is this treated? This condition is treated by making healthy lifestyle changes, such as eating healthy foods, exercising more, and reducing your alcohol intake. You may be referred for counseling on a healthy diet and physical activity. Your health care provider may prescribe medicine if lifestyle changes are not enough to get your blood pressure under control and if: Your systolic blood pressure is above 130. Your diastolic blood pressure is above 80. Your personal target blood pressure may vary depending on your medical conditions, your age, and other factors. Follow these instructions at home: Eating and drinking  Eat a diet that is high in fiber and potassium, and low in sodium, added sugar, and fat. An example of this eating plan is called the DASH diet. DASH stands for Dietary Approaches to Stop Hypertension. To eat this way: Eat   plenty of fresh fruits and vegetables. Try to fill one half of your plate at each meal with fruits and vegetables. Eat whole grains, such as whole-wheat pasta, brown rice, or whole-grain bread. Fill about one  fourth of your plate with whole grains. Eat or drink low-fat dairy products, such as skim milk or low-fat yogurt. Avoid fatty cuts of meat, processed or cured meats, and poultry with skin. Fill about one fourth of your plate with lean proteins, such as fish, chicken without skin, beans, eggs, or tofu. Avoid pre-made and processed foods. These tend to be higher in sodium, added sugar, and fat. Reduce your daily sodium intake. Many people with hypertension should eat less than 1,500 mg of sodium a day. Do not drink alcohol if: Your health care provider tells you not to drink. You are pregnant, may be pregnant, or are planning to become pregnant. If you drink alcohol: Limit how much you have to: 0-1 drink a day for women. 0-2 drinks a day for men. Know how much alcohol is in your drink. In the [place] one drink equals one 12 oz bottle of beer (355 mL), one 5 oz glass of wine (148 mL), or one 1 oz glass of hard liquor (44 mL). Lifestyle  Work with your health care provider to maintain a healthy body weight or to lose weight. Ask what an ideal weight is for you. Get at least 30 minutes of exercise that causes your heart to beat faster (aerobic exercise) most days of the week. Activities may include walking, swimming, or biking. Include exercise to strengthen your muscles (resistance exercise), such as Pilates or lifting weights, as part of your weekly exercise routine. Try to do these types of exercises for 30 minutes at least 3 days a week. Do not use any products that contain nicotine or tobacco. These products include cigarettes, chewing tobacco, and vaping devices, such as e-cigarettes. If you need help quitting, ask your health care provider. Monitor your blood pressure at home as told by your health care provider. Keep all follow-up visits. This is important. Medicines Take over-the-counter and prescription medicines only as told by your health care provider. Follow directions carefully. Blood  pressure medicines must be taken as prescribed. Do not skip doses of blood pressure medicine. Doing this puts you at risk for problems and can make the medicine less effective. Ask your health care provider about side effects or reactions to medicines that you should watch for. Contact a health care provider if you: Think you are having a reaction to a medicine you are taking. Have headaches that keep coming back (recurring). Feel dizzy. Have swelling in your ankles. Have trouble with your vision. Get help right away if you: Develop a severe headache or confusion. Have unusual weakness or numbness. Feel faint. Have severe pain in your chest or abdomen. Vomit repeatedly. Have trouble breathing. These symptoms may be an emergency. Get help right away. Call 911. Do not wait to see if the symptoms will go away. Do not drive yourself to the hospital. Summary Hypertension is when the force of blood pumping through your arteries is too strong. If this condition is not controlled, it may put you at risk for serious complications. Your personal target blood pressure may vary depending on your medical conditions, your age, and other factors. For most people, a normal blood pressure is less than 120/80. Hypertension is treated with lifestyle changes, medicines, or a combination of both. Lifestyle changes include losing weight, eating a healthy,   low-sodium diet, exercising more, and limiting alcohol. This information is not intended to replace advice given to you by your health care provider. Make sure you discuss any questions you have with your health care provider. Document Revised: 08/05/2021 Document Reviewed: 08/05/2021 Elsevier Patient Education  2024 [employer]  

## 2023-05-26 ENCOUNTER — Other Ambulatory Visit: Payer: Self-pay | Admitting: Internal Medicine

## 2023-05-26 DIAGNOSIS — J208 Acute bronchitis due to other specified organisms: Secondary | ICD-10-CM

## 2023-05-26 DIAGNOSIS — J069 Acute upper respiratory infection, unspecified: Secondary | ICD-10-CM

## 2023-05-26 DIAGNOSIS — R058 Other specified cough: Secondary | ICD-10-CM

## 2023-06-07 DIAGNOSIS — M0609 Rheumatoid arthritis without rheumatoid factor, multiple sites: Secondary | ICD-10-CM | POA: Diagnosis not present

## 2023-06-07 DIAGNOSIS — Z79899 Other long term (current) drug therapy: Secondary | ICD-10-CM | POA: Diagnosis not present

## 2023-06-07 DIAGNOSIS — M1991 Primary osteoarthritis, unspecified site: Secondary | ICD-10-CM | POA: Diagnosis not present

## 2023-06-07 DIAGNOSIS — E663 Overweight: Secondary | ICD-10-CM | POA: Diagnosis not present

## 2023-06-07 DIAGNOSIS — Z6826 Body mass index (BMI) 26.0-26.9, adult: Secondary | ICD-10-CM | POA: Diagnosis not present

## 2023-06-09 DIAGNOSIS — H401113 Primary open-angle glaucoma, right eye, severe stage: Secondary | ICD-10-CM | POA: Diagnosis not present

## 2023-06-09 DIAGNOSIS — H538 Other visual disturbances: Secondary | ICD-10-CM | POA: Diagnosis not present

## 2023-06-23 ENCOUNTER — Observation Stay (HOSPITAL_COMMUNITY): Payer: Medicare HMO

## 2023-06-23 ENCOUNTER — Inpatient Hospital Stay (HOSPITAL_BASED_OUTPATIENT_CLINIC_OR_DEPARTMENT_OTHER)
Admission: EM | Admit: 2023-06-23 | Discharge: 2023-06-25 | DRG: 066 | Disposition: A | Discharge status: Home Health | Payer: Medicare HMO | Attending: Internal Medicine | Admitting: Internal Medicine

## 2023-06-23 ENCOUNTER — Encounter (HOSPITAL_BASED_OUTPATIENT_CLINIC_OR_DEPARTMENT_OTHER): Payer: Self-pay | Admitting: Emergency Medicine

## 2023-06-23 ENCOUNTER — Emergency Department (HOSPITAL_BASED_OUTPATIENT_CLINIC_OR_DEPARTMENT_OTHER): Payer: Medicare HMO

## 2023-06-23 ENCOUNTER — Other Ambulatory Visit: Payer: Self-pay

## 2023-06-23 DIAGNOSIS — Z885 Allergy status to narcotic agent status: Secondary | ICD-10-CM

## 2023-06-23 DIAGNOSIS — H5461 Unqualified visual loss, right eye, normal vision left eye: Secondary | ICD-10-CM | POA: Diagnosis present

## 2023-06-23 DIAGNOSIS — Z96653 Presence of artificial knee joint, bilateral: Secondary | ICD-10-CM | POA: Diagnosis present

## 2023-06-23 DIAGNOSIS — M069 Rheumatoid arthritis, unspecified: Secondary | ICD-10-CM | POA: Diagnosis present

## 2023-06-23 DIAGNOSIS — R9089 Other abnormal findings on diagnostic imaging of central nervous system: Secondary | ICD-10-CM | POA: Diagnosis not present

## 2023-06-23 DIAGNOSIS — Z23 Encounter for immunization: Secondary | ICD-10-CM

## 2023-06-23 DIAGNOSIS — R112 Nausea with vomiting, unspecified: Secondary | ICD-10-CM

## 2023-06-23 DIAGNOSIS — R29705 NIHSS score 5: Secondary | ICD-10-CM | POA: Diagnosis present

## 2023-06-23 DIAGNOSIS — I10 Essential (primary) hypertension: Secondary | ICD-10-CM | POA: Diagnosis not present

## 2023-06-23 DIAGNOSIS — Z8673 Personal history of transient ischemic attack (TIA), and cerebral infarction without residual deficits: Secondary | ICD-10-CM | POA: Diagnosis not present

## 2023-06-23 DIAGNOSIS — E1122 Type 2 diabetes mellitus with diabetic chronic kidney disease: Secondary | ICD-10-CM | POA: Diagnosis not present

## 2023-06-23 DIAGNOSIS — H409 Unspecified glaucoma: Secondary | ICD-10-CM | POA: Diagnosis present

## 2023-06-23 DIAGNOSIS — F0390 Unspecified dementia without behavioral disturbance: Secondary | ICD-10-CM | POA: Diagnosis present

## 2023-06-23 DIAGNOSIS — E785 Hyperlipidemia, unspecified: Secondary | ICD-10-CM

## 2023-06-23 DIAGNOSIS — R29818 Other symptoms and signs involving the nervous system: Secondary | ICD-10-CM | POA: Diagnosis not present

## 2023-06-23 DIAGNOSIS — E118 Type 2 diabetes mellitus with unspecified complications: Secondary | ICD-10-CM

## 2023-06-23 DIAGNOSIS — Z1152 Encounter for screening for COVID-19: Secondary | ICD-10-CM | POA: Diagnosis not present

## 2023-06-23 DIAGNOSIS — Z8616 Personal history of COVID-19: Secondary | ICD-10-CM | POA: Diagnosis not present

## 2023-06-23 DIAGNOSIS — I639 Cerebral infarction, unspecified: Secondary | ICD-10-CM | POA: Diagnosis not present

## 2023-06-23 DIAGNOSIS — R4189 Other symptoms and signs involving cognitive functions and awareness: Secondary | ICD-10-CM | POA: Diagnosis present

## 2023-06-23 DIAGNOSIS — I611 Nontraumatic intracerebral hemorrhage in hemisphere, cortical: Secondary | ICD-10-CM | POA: Diagnosis not present

## 2023-06-23 DIAGNOSIS — N1832 Chronic kidney disease, stage 3b: Secondary | ICD-10-CM | POA: Diagnosis not present

## 2023-06-23 DIAGNOSIS — H8113 Benign paroxysmal vertigo, bilateral: Secondary | ICD-10-CM

## 2023-06-23 DIAGNOSIS — I6501 Occlusion and stenosis of right vertebral artery: Secondary | ICD-10-CM | POA: Diagnosis not present

## 2023-06-23 DIAGNOSIS — H53462 Homonymous bilateral field defects, left side: Secondary | ICD-10-CM | POA: Diagnosis present

## 2023-06-23 DIAGNOSIS — R42 Dizziness and giddiness: Secondary | ICD-10-CM

## 2023-06-23 DIAGNOSIS — Z886 Allergy status to analgesic agent status: Secondary | ICD-10-CM

## 2023-06-23 DIAGNOSIS — H538 Other visual disturbances: Secondary | ICD-10-CM | POA: Diagnosis present

## 2023-06-23 DIAGNOSIS — I63511 Cerebral infarction due to unspecified occlusion or stenosis of right middle cerebral artery: Secondary | ICD-10-CM | POA: Diagnosis not present

## 2023-06-23 DIAGNOSIS — Z7902 Long term (current) use of antithrombotics/antiplatelets: Secondary | ICD-10-CM

## 2023-06-23 DIAGNOSIS — Z8249 Family history of ischemic heart disease and other diseases of the circulatory system: Secondary | ICD-10-CM

## 2023-06-23 DIAGNOSIS — M05712 Rheumatoid arthritis with rheumatoid factor of left shoulder without organ or systems involvement: Secondary | ICD-10-CM

## 2023-06-23 DIAGNOSIS — I6521 Occlusion and stenosis of right carotid artery: Secondary | ICD-10-CM | POA: Diagnosis not present

## 2023-06-23 DIAGNOSIS — M05711 Rheumatoid arthritis with rheumatoid factor of right shoulder without organ or systems involvement: Secondary | ICD-10-CM

## 2023-06-23 DIAGNOSIS — I1310 Hypertensive heart and chronic kidney disease without heart failure, with stage 1 through stage 4 chronic kidney disease, or unspecified chronic kidney disease: Secondary | ICD-10-CM | POA: Diagnosis present

## 2023-06-23 DIAGNOSIS — G4700 Insomnia, unspecified: Secondary | ICD-10-CM | POA: Diagnosis present

## 2023-06-23 DIAGNOSIS — Z888 Allergy status to other drugs, medicaments and biological substances status: Secondary | ICD-10-CM

## 2023-06-23 DIAGNOSIS — G9389 Other specified disorders of brain: Secondary | ICD-10-CM | POA: Diagnosis present

## 2023-06-23 DIAGNOSIS — I5030 Unspecified diastolic (congestive) heart failure: Secondary | ICD-10-CM | POA: Diagnosis not present

## 2023-06-23 DIAGNOSIS — Z79899 Other long term (current) drug therapy: Secondary | ICD-10-CM

## 2023-06-23 HISTORY — DX: Cerebral infarction, unspecified: I63.9

## 2023-06-23 LAB — CBC
HCT: 37.7 % (ref 36.0–46.0)
Hemoglobin: 12.8 g/dL (ref 12.0–15.0)
MCH: 30.3 pg (ref 26.0–34.0)
MCHC: 34 g/dL (ref 30.0–36.0)
MCV: 89.3 fL (ref 80.0–100.0)
Platelets: 183 10*3/uL (ref 150–400)
RBC: 4.22 MIL/uL (ref 3.87–5.11)
RDW: 15.2 % (ref 11.5–15.5)
WBC: 9 10*3/uL (ref 4.0–10.5)
nRBC: 0 % (ref 0.0–0.2)

## 2023-06-23 LAB — BASIC METABOLIC PANEL
Anion gap: 8 (ref 5–15)
BUN: 14 mg/dL (ref 8–23)
CO2: 28 mmol/L (ref 22–32)
Calcium: 9.4 mg/dL (ref 8.9–10.3)
Chloride: 104 mmol/L (ref 98–111)
Creatinine, Ser: 0.93 mg/dL (ref 0.44–1.00)
GFR, Estimated: 59 mL/min — ABNORMAL LOW (ref >60)
Glucose, Bld: 119 mg/dL — ABNORMAL HIGH (ref 70–99)
Potassium: 3.8 mmol/L (ref 3.5–5.1)
Sodium: 140 mmol/L (ref 135–145)

## 2023-06-23 LAB — COMPREHENSIVE METABOLIC PANEL
ALT: 25 U/L (ref 0–44)
AST: 26 U/L (ref 15–41)
Albumin: 3.8 g/dL (ref 3.5–5.0)
Alkaline Phosphatase: 114 U/L (ref 38–126)
Anion gap: 11 (ref 5–15)
BUN: 7 mg/dL — ABNORMAL LOW (ref 8–23)
CO2: 25 mmol/L (ref 22–32)
Calcium: 9.4 mg/dL (ref 8.9–10.3)
Chloride: 104 mmol/L (ref 98–111)
Creatinine, Ser: 0.84 mg/dL (ref 0.44–1.00)
GFR, Estimated: >60 mL/min (ref >60)
Glucose, Bld: 118 mg/dL — ABNORMAL HIGH (ref 70–99)
Potassium: 3.6 mmol/L (ref 3.5–5.1)
Sodium: 140 mmol/L (ref 135–145)
Total Bilirubin: 0.9 mg/dL (ref 0.3–1.2)
Total Protein: 8 g/dL (ref 6.5–8.1)

## 2023-06-23 LAB — MAGNESIUM: Magnesium: 1.9 mg/dL (ref 1.7–2.4)

## 2023-06-23 LAB — TSH: TSH: 1.44 u[IU]/mL (ref 0.350–4.500)

## 2023-06-23 LAB — LIPASE, BLOOD: Lipase: 28 U/L (ref 11–51)

## 2023-06-23 LAB — URINALYSIS, ROUTINE W REFLEX MICROSCOPIC
Bilirubin Urine: NEGATIVE
Glucose, UA: NEGATIVE mg/dL
Hgb urine dipstick: NEGATIVE
Ketones, ur: NEGATIVE mg/dL
Leukocytes,Ua: NEGATIVE
Nitrite: NEGATIVE
Specific Gravity, Urine: 1.011 (ref 1.005–1.030)
pH: 6.5 (ref 5.0–8.0)

## 2023-06-23 LAB — GLUCOSE, CAPILLARY: Glucose-Capillary: 103 mg/dL — ABNORMAL HIGH (ref 70–99)

## 2023-06-23 LAB — CBG MONITORING, ED: Glucose-Capillary: 126 mg/dL — ABNORMAL HIGH (ref 70–99)

## 2023-06-23 LAB — CK: Total CK: 116 U/L (ref 38–234)

## 2023-06-23 LAB — PHOSPHORUS: Phosphorus: 3.5 mg/dL (ref 2.5–4.6)

## 2023-06-23 LAB — SARS CORONAVIRUS 2 BY RT PCR: SARS Coronavirus 2 by RT PCR: NEGATIVE

## 2023-06-23 MED ORDER — IOHEXOL 350 MG/ML SOLN
75.0000 mL | Freq: Once | INTRAVENOUS | Status: AC | PRN
  Administered 2023-06-23: 75 mL via INTRAVENOUS

## 2023-06-23 MED ORDER — NETARSUDIL DIMESYLATE 0.02 % OP SOLN: 1.0000 [drp] | Freq: Every day | OPHTHALMIC | Status: DC

## 2023-06-23 MED ORDER — ATORVASTATIN CALCIUM 10 MG PO TABS
10.0000 mg | ORAL_TABLET | Freq: Every day | ORAL | Status: DC
  Administered 2023-06-23 – 2023-06-24 (×2): 10 mg via ORAL
  Filled 2023-06-23 (×2): qty 1

## 2023-06-23 MED ORDER — SODIUM CHLORIDE 0.9 % IV BOLUS
1000.0000 mL | Freq: Once | INTRAVENOUS | Status: AC
  Administered 2023-06-23: 1000 mL via INTRAVENOUS

## 2023-06-23 MED ORDER — DORZOLAMIDE HCL-TIMOLOL MAL 2-0.5 % OP SOLN
1.0000 [drp] | Freq: Two times a day (BID) | OPHTHALMIC | Status: DC
  Administered 2023-06-23 – 2023-06-25 (×4): 1 [drp] via OPHTHALMIC
  Filled 2023-06-23: qty 10

## 2023-06-23 MED ORDER — LATANOPROST 0.005 % OP SOLN
1.0000 [drp] | Freq: Every day | OPHTHALMIC | Status: DC
  Administered 2023-06-23 – 2023-06-24 (×2): 1 [drp] via OPHTHALMIC
  Filled 2023-06-23: qty 2.5

## 2023-06-23 MED ORDER — SODIUM CHLORIDE 0.9 % IV SOLN: INTRAVENOUS | Status: DC

## 2023-06-23 MED ORDER — INSULIN ASPART 100 UNIT/ML IJ SOLN: 0.0000 [IU] | Freq: Every day | INTRAMUSCULAR | Status: DC

## 2023-06-23 MED ORDER — MECLIZINE HCL 25 MG PO TABS
25.0000 mg | ORAL_TABLET | Freq: Once | ORAL | Status: AC
  Administered 2023-06-23: 25 mg via ORAL
  Filled 2023-06-23: qty 1

## 2023-06-23 MED ORDER — STROKE: EARLY STAGES OF RECOVERY BOOK
Freq: Once | Status: AC
  Filled 2023-06-23: qty 1

## 2023-06-23 MED ORDER — ONDANSETRON HCL 4 MG/2ML IJ SOLN
4.0000 mg | Freq: Once | INTRAMUSCULAR | Status: AC
  Administered 2023-06-23: 4 mg via INTRAVENOUS
  Filled 2023-06-23: qty 2

## 2023-06-23 MED ORDER — INSULIN ASPART 100 UNIT/ML IJ SOLN
0.0000 [IU] | Freq: Three times a day (TID) | INTRAMUSCULAR | Status: DC
  Administered 2023-06-24: 1 [IU] via SUBCUTANEOUS

## 2023-06-23 MED ORDER — HYDROXYCHLOROQUINE SULFATE 200 MG PO TABS
200.0000 mg | ORAL_TABLET | Freq: Two times a day (BID) | ORAL | Status: DC
  Administered 2023-06-23 – 2023-06-25 (×4): 200 mg via ORAL
  Filled 2023-06-23 (×5): qty 1

## 2023-06-23 MED ORDER — CLOPIDOGREL BISULFATE 75 MG PO TABS
75.0000 mg | ORAL_TABLET | Freq: Once | ORAL | Status: AC
  Administered 2023-06-23: 75 mg via ORAL
  Filled 2023-06-23: qty 1

## 2023-06-23 NOTE — Assessment & Plan Note (Signed)
Continue Plaquenil 200 mg twice a day

## 2023-06-23 NOTE — Subjective & Objective (Signed)
Presents with dizziness and blurred vision w nausea since this AM endorses brain fog started after breakfast,  Chronic visual loss from right eye due to glaucoma No trouble walking no speech difficulty

## 2023-06-23 NOTE — ED Triage Notes (Signed)
Dizziness and blurry vision , nausea started this morning . Unable to recall the exact timing .  Per daughter she usually eat her breakfast at 10, she took her meal ok .  No new onset of confusion per daughter  Reports feels "brain Fog" intermittently .

## 2023-06-23 NOTE — Assessment & Plan Note (Signed)
-   will admit based on TIA/CVA protocol, Initial Stroke scale        Monitor on Tele       MRA/MRI await  results         CTA orderd       Echo to evaluate for possible embolic source,        obtain cardiac enzymes,  ECG,   Lipid panel, TSH.        Order PT/OT evaluation.         passed swallow eval        Will make sure patient is on antiplatelet allergic to aspirin will likely need  Plavix agent and statin if LDL >70       Allow permissive Hypertension keep BP <220/120        Neurology consulted

## 2023-06-23 NOTE — Progress Notes (Signed)
Received patient via stretcher from [facility]. Oriented to room and paged MD that the patient has arrived.

## 2023-06-23 NOTE — Assessment & Plan Note (Signed)
-  Continue Lipitor °

## 2023-06-23 NOTE — Assessment & Plan Note (Signed)
Order sliding scale diet controlled

## 2023-06-23 NOTE — ED Notes (Signed)
Pt aware of need for urine. Unable to provide sample at this time.

## 2023-06-23 NOTE — Plan of Care (Addendum)
  Problem: Education: Goal: Knowledge of General Education information will improve Description: Including pain rating scale, medication(s)/side effects and non-pharmacologic comfort measures Outcome: Progressing   Problem: Activity: Goal: Risk for activity intolerance will decrease Outcome: Progressing   

## 2023-06-23 NOTE — ED Notes (Signed)
2 x unsuccessful IV attempts, RAC, RT FA, pt tolerated well

## 2023-06-23 NOTE — Plan of Care (Signed)
This is a 89 year old female with past medical history of hypertension, hyperlipidemia, glaucoma and arthritis who presented to [facility] ED with complaint of blurry vision and dizziness which started in the late morning today.  Reportedly, she was doing well and was in her usual state of health when she woke up and had breakfast but then suddenly she started feeling dizzy and nauseous and had blurry vision in both eyes, having said that, patient appears to have glaucoma and cannot see well from the right eye for that and cannot see well from the left eye due to not having glasses at the moment.  Unsure whether this is acute or chronic visual changes.  Nonetheless, further workup including CT scan shows possible cerebellar stroke.  EDP has discussed the case with neurology who recommended admission to hospital service for further workup of stroke.  Of note, patient has not received any antiplatelets.  Per EDP, patient has allergy to aspirin.  I requested the patient to give her Plavix if there is no contraindication.  Patient is accepted under hospital service to [facility] service and neurology will consult.  Patient to remain under care of emergency department until she arrives at [facility] campus.

## 2023-06-23 NOTE — ED Provider Notes (Signed)
[place] [facility] AT [facility] Provider Note   CSN: [id number] Arrival date & time: 06/23/23  1243     History  Chief Complaint  Patient presents with   Blurred Vision   Dizziness    Briana [name] is a 89 y.o. female.  Pt is a 89 yo female with pmhx significant for arthritis, htn, hld, and glaucoma.  Pt said she was ok when she woke up.  She ate her breakfast normally, but then started feeling dizzy and nauseous.  She has blurry vision in both eyes.  No weakness in arms/legs.  Pt said she can usually not see well from her right eye as she has glaucoma.  She can't see well from her left eye as she does not have her glasses, so it is unclear if she has new blurry vision.  She is not sure exactly when that started.  Pt denies any trouble speaking or swallowing.  Dizziness is worse with movement.       Home Medications Prior to Admission medications   Medication Sig Start Date End Date Taking? Authorizing Provider  acetaminophen (TYLENOL) 650 MG CR tablet Take 650 mg by mouth every 8 (eight) hours as needed for pain.    [provider]  amLODipine (NORVASC) 10 MG tablet Take 1 tablet (10 mg total) by mouth daily. 01/07/23   Etta Grandchild, MD  atenolol (TENORMIN) 100 MG tablet Take 1 tablet (100 mg total) by mouth daily. 01/07/23   Etta Grandchild, MD  atorvastatin (LIPITOR) 10 MG tablet TAKE 1 TABLET EVERY DAY 02/22/23   Etta Grandchild, MD  b complex vitamins tablet Take 1 tablet by mouth daily.    [provider]  Cholecalciferol (VITAMIN D) 2000 UNITS tablet Take 2,000 Units by mouth daily.    [provider]  dorzolamide-timolol (COSOPT) 22.3-6.8 MG/ML ophthalmic solution Place 1 drop into both eyes 2 (two) times daily.    [provider]  hydroxychloroquine (PLAQUENIL) 200 MG tablet Take 200 mg by mouth 2 (two) times daily.    Zenovia Jordan, MD  hydrOXYzine (ATARAX) 10 MG tablet TAKE 1 TABLET(10 MG) BY MOUTH EVERY 8  HOURS AS NEEDED 03/03/23   Georgina Quint, MD  latanoprost (XALATAN) 0.005 % ophthalmic solution Place 1 drop into both eyes at bedtime.    [provider]  Netarsudil Dimesylate (RHOPRESSA) 0.02 % SOLN Place 1 drop into the right eye at bedtime.    [provider]  promethazine (PHENERGAN) 12.5 MG tablet TAKE 1 TABLET BY MOUTH EVERY 6 HOURS AS NEEDED FOR NAUSEA OR VOMITING 05/26/23   Etta Grandchild, MD  traMADol (ULTRAM) 50 MG tablet TAKE 1 TABLET EVERY 12 HOURS AS NEEDED FOR MODERATE PAIN 01/07/23   Etta Grandchild, MD      Allergies    Ibuprofen; Percocet [oxycodone-acetaminophen]; Simvastatin; 2,4-d dimethylamine; Diltiazem hcl; Hydrochlorothiazide; Lisinopril; Valacyclovir; and Verapamil    Review of Systems   Review of Systems  Gastrointestinal:  Positive for nausea.  Neurological:  Positive for dizziness.  All other systems reviewed and are negative.   Physical Exam Updated Vital Signs BP (!) 154/61   Pulse 60   Temp (!) 97.5 F (36.4 C) (Oral)   Resp 17   Wt 71.4 kg   SpO2 95%   BMI 27.02 kg/m  Physical Exam Vitals and nursing note reviewed.  Constitutional:      Appearance: Normal appearance.  HENT:     Head: Normocephalic and atraumatic.  Right Ear: External ear normal.     Left Ear: External ear normal.     Nose: Nose normal.     Mouth/Throat:     Mouth: Mucous membranes are moist.     Pharynx: Oropharynx is clear.  Eyes:     Extraocular Movements: Extraocular movements intact.     Conjunctiva/sclera: Conjunctivae normal.     Pupils: Pupils are equal, round, and reactive to light.  Cardiovascular:     Rate and Rhythm: Regular rhythm. Bradycardia present.     Pulses: Normal pulses.     Heart sounds: Normal heart sounds.  Pulmonary:     Effort: Pulmonary effort is normal.     Breath sounds: Normal breath sounds.  Abdominal:     General: Abdomen is flat. Bowel sounds are normal.     Palpations: Abdomen is soft.  Musculoskeletal:         General: Normal range of motion.     Cervical back: Normal range of motion and neck supple.  Skin:    General: Skin is warm.     Capillary Refill: Capillary refill takes less than 2 seconds.  Neurological:     General: No focal deficit present.     Mental Status: She is alert and oriented to person, place, and time.  Psychiatric:        Mood and Affect: Mood normal.        Behavior: Behavior normal.     ED Results / Procedures / Treatments   Labs (all labs ordered are listed, but only abnormal results are displayed) Labs Reviewed  BASIC METABOLIC PANEL - Abnormal; Notable for the following components:      Result Value   Glucose, Bld 119 (*)    GFR, Estimated 59 (*)    All other components within normal limits  URINALYSIS, ROUTINE W REFLEX MICROSCOPIC - Abnormal; Notable for the following components:   Color, Urine COLORLESS (*)    Protein, ur TRACE (*)    All other components within normal limits  CBG MONITORING, ED - Abnormal; Notable for the following components:   Glucose-Capillary 126 (*)    All other components within normal limits  SARS CORONAVIRUS 2 BY RT PCR  CBC    EKG EKG Interpretation Date/Time:  Wednesday June 23 2023 12:53:32 EDT Ventricular Rate:  59 PR Interval:  176 QRS Duration:  76 QT Interval:  450 QTC Calculation: 445 R Axis:   -11  Text Interpretation: Sinus bradycardia Otherwise normal ECG When compared with ECG of 07-Aug-2021 16:02, Premature ventricular complexes are no longer Present Nonspecific T wave abnormality has replaced inverted T waves in Inferior leads Confirmed by Jacalyn Lefevre [phone]) on 06/23/2023 1:06:41 PM  Radiology CT Head Wo Contrast  Result Date: 06/23/2023 CLINICAL DATA:  Neuro deficit, acute, stroke suspected EXAM: CT HEAD WITHOUT CONTRAST TECHNIQUE: Contiguous axial images were obtained from the base of the skull through the vertex without intravenous contrast. RADIATION DOSE REDUCTION: This exam was  performed according to the departmental dose-optimization program which includes automated exposure control, adjustment of the mA and/or kV according to patient size and/or use of iterative reconstruction technique. COMPARISON:  Head CT 08/07/21 FINDINGS: Brain: No hemorrhage. No hydrocephalus. No extra-axial fluid collection. There is prominence of the bilateral subarachnoid spaces along the frontal convexities, likely related to volume loss. Compared to prior head CT from 08/07/2021 there is a new age indeterminate infarct in the right parietal lobe (series 2, image 19). There is mild ex vacuo dilatation of  the right lateral ventricular system in this region. Redemonstrated chronic right cerebellar infarct. There is also a chronic left occipital lobe infarct (series 2, image 13). Compared to prior exam there is a new left cerebellar infarct (series 2, image 5). Vascular: No hyperdense vessel or unexpected calcification. Skull: Normal. Negative for fracture or focal lesion. Sinuses/Orbits: No middle ear or mastoid effusion. Paranasal sinuses are clear. Bilateral lens replacement. Orbits are otherwise unremarkable. Other: None. IMPRESSION: 1. Age indeterminate infarct in the left cerebellar hemisphere. 2. Age indeterminate, but likely chronic, right parietal lobe infarct. Findings were discussed with Dr. Particia Nearing on 06/23/23 at 3:40 PM. Electronically Signed   By: Lorenza Cambridge M.D.   On: 06/23/2023 15:50    Procedures Procedures    Medications Ordered in ED Medications  clopidogrel (PLAVIX) tablet 75 mg (has no administration in time range)  sodium chloride 0.9 % bolus 1,000 mL (1,000 mLs Intravenous New Bag/Given 06/23/23 1358)  meclizine (ANTIVERT) tablet 25 mg (25 mg Oral Given 06/23/23 1344)  ondansetron (ZOFRAN) injection 4 mg (4 mg Intravenous Given 06/23/23 1353)    ED Course/ Medical Decision Making/ A&P                                 Medical Decision Making Amount and/or Complexity of Data  Reviewed Labs: ordered. Radiology: ordered.  Risk Prescription drug management. Decision regarding hospitalization.   This patient presents to the ED for concern of dizziness, this involves an extensive number of treatment options, and is a complaint that carries with it a high risk of complications and morbidity.  The differential diagnosis includes cva, vertigo, infection, covid   Co morbidities that complicate the patient evaluation  arthritis, htn, hld, and glaucoma   Additional history obtained:  Additional history obtained from epic chart review External records from outside source obtained and reviewed including daughter   Lab Tests:  I Ordered, and personally interpreted labs.  The pertinent results include:  cbc nl, bmp nl, ua nl, covid neg   Imaging Studies ordered:  I ordered imaging studies including ct head  I independently visualized and interpreted imaging which showed  Age indeterminate infarct in the left cerebellar hemisphere.  2. Age indeterminate, but likely chronic, right parietal lobe  infarct.   I agree with the radiologist interpretation   Cardiac Monitoring:  The patient was maintained on a cardiac monitor.  I personally viewed and interpreted the cardiac monitored which showed an underlying rhythm of: nsr   Medicines ordered and prescription drug management:  I ordered medication including ivfs/zofran/antivert  for sx  Reevaluation of the patient after these medicines showed that the patient improved I have reviewed the patients home medicines and have made adjustments as needed   Test Considered:  ct   Critical Interventions:  Neuro consult   Consultations Obtained:  I requested consultation with the neurologist (Dr. Selina Cooley),  and discussed lab and imaging findings as well as pertinent plan - she recommends admission for stroke work up Pt d/w Dr. Jacqulyn Bath ([facility]) who will admit    Problem List / ED Course:  CVA:  code  stroke not called because pt did not have a lvo.  She does have a small stroke, but sx are improving, so she does not need tpa.  Pt is also not clear of time sx started.  Pt is allergic to asa, so asa not given.  75 mg Plavix given per Dr. Sharen Hones  recommendation. Dizziness:  improved after treatment   Reevaluation:  After the interventions noted above, I reevaluated the patient and found that they have :improved   Social Determinants of Health:  Lives at home   Dispostion:  After consideration of the diagnostic results and the patients response to treatment, I feel that the patent would benefit from admission.    CRITICAL CARE Performed by: Jacalyn Lefevre   Total critical care time: 30 minutes  Critical care time was exclusive of separately billable procedures and treating other patients.  Critical care was necessary to treat or prevent imminent or life-threatening deterioration.  Critical care was time spent personally by me on the following activities: development of treatment plan with patient and/or surrogate as well as nursing, discussions with consultants, evaluation of patient's response to treatment, examination of patient, obtaining history from patient or surrogate, ordering and performing treatments and interventions, ordering and review of laboratory studies, ordering and review of radiographic studies, pulse oximetry and re-evaluation of patient's condition.         Final Clinical Impression(s) / ED Diagnoses Final diagnoses:  Cerebrovascular accident (CVA), unspecified mechanism (HCC)  Nausea and vomiting, unspecified vomiting type  Dizziness    Rx / DC Orders ED Discharge Orders     None         Jacalyn Lefevre, MD 06/23/23 1612

## 2023-06-23 NOTE — Plan of Care (Signed)
  Problem: Education: Goal: Knowledge of General Education information will improve Description: Including pain rating scale, medication(s)/side effects and non-pharmacologic comfort measures 06/23/2023 1934 by Avie Arenas, RN Outcome: Progressing 06/23/2023 1907 by Avie Arenas, RN Outcome: Progressing   Problem: Activity: Goal: Risk for activity intolerance will decrease 06/23/2023 1934 by Avie Arenas, RN Outcome: Progressing 06/23/2023 1907 by Avie Arenas, RN Outcome: Progressing   Problem: Coping: Goal: Level of anxiety will decrease Outcome: Progressing   Problem: Safety: Goal: Ability to remain free from injury will improve Outcome: Progressing

## 2023-06-23 NOTE — H&P (Signed)
Briana [name] WGN:[id number] DOB: [birth year] DOA: 06/23/2023     PCP: Briana Grandchild, MD   Outpatient Specialists:     GI  Dr.  Shearon Stalls) Iva Boop, MD    Patient arrived to ER on 06/23/23 at 1243 Referred by Attending Hughie Closs, MD   Patient coming from:    home Lives With family    Chief Complaint:   Chief Complaint  Patient presents with   Blurred Vision   Dizziness    HPI: Briana [name] is a 89 y.o. female with medical history significant of HTN, HLD, glaucoma     Presented with   dizziness, blurred vision Presents with dizziness and blurred vision w nausea since this AM endorses brain fog started after breakfast,  Chronic visual loss from right eye due to glaucoma No trouble walking no speech difficulty  further workup including CT scan shows possible cerebellar stroke  patient has allergy to aspirin.    She was given a dose of home Plavix  Denies significant ETOH intake   Does not smoke   Lab Results  Component Value Date   SARSCOV2NAA NEGATIVE 06/23/2023   SARSCOV2NAA NEGATIVE 08/08/2021        Regarding pertinent Chronic problems:     Hyperlipidemia -  on statins Lipitor (atorvastatin)  Lipid Panel     Component Value Date/Time   CHOL 192 01/07/2023 1031   TRIG 48.0 01/07/2023 1031   HDL 119.40 01/07/2023 1031   CHOLHDL 2 01/07/2023 1031   VLDL 9.6 01/07/2023 1031   LDLCALC 63 01/07/2023 1031   LDLDIRECT 104.5 04/25/2010 1237     HTN on NOrvasc, atenonlol,        While in ER:    CT showed 1. Age indeterminate infarct in the left cerebellar hemisphere. 2. Age indeterminate, but likely chronic, right parietal lobe infarct.     Lab Orders         SARS Coronavirus 2 by RT PCR (hospital order, performed in [facility] hospital lab) *cepheid single result test* Anterior Nasal Swab         Basic metabolic panel         CBC         Urinalysis, Routine w reflex microscopic -Urine, Clean Catch         CBG monitoring, ED       CT HEAD. Age indeterminate infarct in the left cerebellar hemisphere.  Age indeterminate, but likely chronic, right parietal lobe infarct.   CTA - No emergent large vessel occlusion. 2. Approximately 65% stenosis of the proximal right ICA secondary to bulky calcification.  Moderate stenosis of the right clinoid segment of the internal carotid artery. 4. Moderate narrowing of the right vertebral artery origin. CXR - ordered Following Medications were ordered in ER: Medications  sodium chloride 0.9 % bolus 1,000 mL (0 mLs Intravenous Stopped 06/23/23 1711)  meclizine (ANTIVERT) tablet 25 mg (25 mg Oral Given 06/23/23 1344)  ondansetron (ZOFRAN) injection 4 mg (4 mg Intravenous Given 06/23/23 1353)  clopidogrel (PLAVIX) tablet 75 mg (75 mg Oral Given 06/23/23 1626)    _______________________________________________________ ER Provider Called:      Neurology  Dr. Darlyn Read They Recommend admit to medicine   Will see  today     ED Triage Vitals  Encounter Vitals Group     BP 06/23/23 1248 (!) 153/59     Systolic BP Percentile --      Diastolic BP Percentile --  Pulse Rate 06/23/23 1248 (!) 59     Resp 06/23/23 1248 18     Temp 06/23/23 1248 (!) 97.5 F (36.4 C)     Temp Source 06/23/23 1248 Oral     SpO2 06/23/23 1248 99 %     Weight 06/23/23 1302 157 lb 6.4 oz (71.4 kg)     Height 06/23/23 1815 5\' 2"  (1.575 m)     Head Circumference --      Peak Flow --      Pain Score 06/23/23 1302 0     Pain Loc --      Pain Education --      Exclude from Growth Chart --   EXBM(84)@     _________________________________________ Significant initial  Findings: Abnormal Labs Reviewed  BASIC METABOLIC PANEL - Abnormal; Notable for the following components:      Result Value   Glucose, Bld 119 (*)    GFR, Estimated 59 (*)    All other components within normal limits  URINALYSIS, ROUTINE W REFLEX MICROSCOPIC - Abnormal; Notable for the following components:   Color, Urine COLORLESS  (*)    Protein, ur TRACE (*)    All other components within normal limits  CBG MONITORING, ED - Abnormal; Notable for the following components:   Glucose-Capillary 126 (*)    All other components within normal limits      ECG: Ordered Personally reviewed and interpreted by me showing: HR : 59 Rhythm:Otherwise normal ECG When compared with ECG of 07-Aug-2021 16:02, Premature ventricular complexes are no longer Present Nonspecific T wave abnormality has replaced inverted T waves in Inferior leads QTC 445     Lab Results  Component Value Date   SARSCOV2NAA NEGATIVE 06/23/2023   SARSCOV2NAA NEGATIVE 08/08/2021     The recent clinical data is shown below. Vitals:   06/23/23 1630 06/23/23 1700 06/23/23 1805 06/23/23 1815  BP: (!) 156/72 (!) 164/68 (!) 163/62   Pulse: 67 65 64   Resp: 16  16   Temp:   98.6 F (37 C)   TempSrc:   Oral   SpO2: 98% 96% 100%   Weight:      Height:    5\' 2"  (1.575 m)    WBC     Component Value Date/Time   WBC 9.0 06/23/2023 1321   LYMPHSABS 1.4 01/07/2023 1031   MONOABS 0.6 01/07/2023 1031   EOSABS 0.3 01/07/2023 1031   BASOSABS 0.1 01/07/2023 1031      UA   no evidence of UTI     Urine analysis:    Component Value Date/Time   COLORURINE COLORLESS (A) 06/23/2023 1321   APPEARANCEUR CLEAR 06/23/2023 1321   LABSPEC 1.011 06/23/2023 1321   PHURINE 6.5 06/23/2023 1321   GLUCOSEU NEGATIVE 06/23/2023 1321   GLUCOSEU NEGATIVE 01/07/2023 1031   HGBUR NEGATIVE 06/23/2023 1321   BILIRUBINUR NEGATIVE 06/23/2023 1321   KETONESUR NEGATIVE 06/23/2023 1321   PROTEINUR TRACE (A) 06/23/2023 1321   UROBILINOGEN 0.2 01/07/2023 1031   NITRITE NEGATIVE 06/23/2023 1321   LEUKOCYTESUR NEGATIVE 06/23/2023 1321    Results for orders placed or performed during the hospital encounter of 06/23/23  SARS Coronavirus 2 by RT PCR (hospital order, performed in [facility] hospital lab) *cepheid single result test* Anterior Nasal Swab     Status: None    Collection Time: 06/23/23  1:28 PM   Specimen: Anterior Nasal Swab  Result Value Ref Range Status   SARS Coronavirus 2 by RT PCR NEGATIVE NEGATIVE  Final          __________________________________________________________ Recent Labs  Lab 06/23/23 1321  NA 140  K 3.8  CO2 28  GLUCOSE 119*  BUN 14  CREATININE 0.93  CALCIUM 9.4    Cr stable,  Lab Results  Component Value Date   CREATININE 0.93 06/23/2023   CREATININE 0.92 01/07/2023   CREATININE 0.95 04/30/2022    No results for input(s): "AST", "ALT", "ALKPHOS", "BILITOT", "PROT", "ALBUMIN" in the last 168 hours. Lab Results  Component Value Date   CALCIUM 9.4 06/23/2023       Plt: Lab Results  Component Value Date   PLT 183 06/23/2023       Recent Labs  Lab 06/23/23 1321  WBC 9.0  HGB 12.8  HCT 37.7  MCV 89.3  PLT 183    HG/HCT   stable,      Component Value Date/Time   HGB 12.8 06/23/2023 1321   HCT 37.7 06/23/2023 1321   MCV 89.3 06/23/2023 1321      _______________________________________________ Hospitalist was called for admission for   Cerebrovascular accident (CVA),    The following Work up has been ordered so far:  Orders Placed This Encounter  Procedures   SARS Coronavirus 2 by RT PCR (hospital order, performed in [facility] hospital lab) *cepheid single result test* Anterior Nasal Swab   CT Head Wo Contrast   Basic metabolic panel   CBC   Urinalysis, Routine w reflex microscopic -Urine, Clean Catch   Document Height and Actual Weight   ED Cardiac monitoring   Cardiac Monitoring Continuous x 24 hours Indications for use: Other; other indications for use: stroke   Consult to neurology   Consult to hospitalist   CBG monitoring, ED   EKG 12-Lead   ED EKG   EKG   EKG   Place in observation (patient's expected length of stay will be less than 2 midnights)     OTHER Significant initial  Findings:  labs showing:     DM  labs:  HbA1C: Recent Labs    01/07/23 1031  HGBA1C 6.0        CBG (last 3)  Recent Labs    06/23/23 1339  GLUCAP 126*    Cultures:    Component Value Date/Time   SDES BLOOD SITE NOT SPECIFIED 08/08/2021 0824   SPECREQUEST  08/08/2021 0824    BOTTLES DRAWN AEROBIC AND ANAEROBIC Blood Culture results may not be optimal due to an excessive volume of blood received in culture bottles   CULT  08/08/2021 0824    NO GROWTH 5 DAYS Performed at [facility], [address] [place], [place] [address]    REPTSTATUS 08/13/2021 FINAL 08/08/2021 1914     Radiological Exams on Admission: CT Head Wo Contrast  Result Date: 06/23/2023 CLINICAL DATA:  Neuro deficit, acute, stroke suspected EXAM: CT HEAD WITHOUT CONTRAST TECHNIQUE: Contiguous axial images were obtained from the base of the skull through the vertex without intravenous contrast. RADIATION DOSE REDUCTION: This exam was performed according to the departmental dose-optimization program which includes automated exposure control, adjustment of the mA and/or kV according to patient size and/or use of iterative reconstruction technique. COMPARISON:  Head CT 08/07/21 FINDINGS: Brain: No hemorrhage. No hydrocephalus. No extra-axial fluid collection. There is prominence of the bilateral subarachnoid spaces along the frontal convexities, likely related to volume loss. Compared to prior head CT from 08/07/2021 there is a new age indeterminate infarct in the right parietal lobe (series 2,  image 19). There is mild ex vacuo dilatation of the right lateral ventricular system in this region. Redemonstrated chronic right cerebellar infarct. There is also a chronic left occipital lobe infarct (series 2, image 13). Compared to prior exam there is a new left cerebellar infarct (series 2, image 5). Vascular: No hyperdense vessel or unexpected calcification. Skull: Normal. Negative for fracture or focal lesion. Sinuses/Orbits: No middle ear or mastoid effusion. Paranasal sinuses are clear. Bilateral lens  replacement. Orbits are otherwise unremarkable. Other: None. IMPRESSION: 1. Age indeterminate infarct in the left cerebellar hemisphere. 2. Age indeterminate, but likely chronic, right parietal lobe infarct. Findings were discussed with Dr. Particia Nearing on 06/23/23 at 3:40 PM. Electronically Signed   By: Lorenza Cambridge M.D.   On: 06/23/2023 15:50   _______________________________________________________________________________________________________ Latest  Blood pressure (!) 163/62, pulse 64, temperature 98.6 F (37 C), temperature source Oral, resp. rate 16, height 5\' 2"  (1.575 m), weight 71.4 kg, SpO2 100%.   Vitals  labs and radiology finding personally reviewed  Review of Systems:    Pertinent positives include:  vertigo confusion ataxia  Constitutional:  No weight loss, night sweats, Fevers, chills, fatigue, weight loss  HEENT:  No headaches, Difficulty swallowing,Tooth/dental problems,Sore throat,  No sneezing, itching, ear ache, nasal congestion, post nasal drip,  Cardio-vascular:  No chest pain, Orthopnea, PND, anasarca, dizziness, palpitations.no Bilateral lower extremity swelling  GI:  No heartburn, indigestion, abdominal pain, nausea, vomiting, diarrhea, change in bowel habits, loss of appetite, melena, blood in stool, hematemesis Resp:  no shortness of breath at rest. No dyspnea on exertion, No excess mucus, no productive cough, No non-productive cough, No coughing up of blood.No change in color of mucus.No wheezing. Skin:  no rash or lesions. No jaundice GU:  no dysuria, change in color of urine, no urgency or frequency. No straining to urinate.  No flank pain.  Musculoskeletal:  No joint pain or no joint swelling. No decreased range of motion. No back pain.  Psych:  No change in mood or affect. No depression or anxiety. No memory loss.  Neuro: no localizing neurological complaints, no tingling, no weakness, no double vision, no gait abnormality, no slurred speech, no   All  systems reviewed and apart from HOPI all are negative _______________________________________________________________________________________________ Past Medical History:   Past Medical History:  Diagnosis Date   ACUTE URIS OF UNSPECIFIED SITE 11/26/2008   ALLERGIC ARTHRITIS OTHER SPECIFIED SITES 10/22/2010   OA + RA   ALLERGIC RHINITIS 10/28/2007   ARTHRITIS, KNEES, BILATERAL 10/28/2007   BACK PAIN 04/12/2009   BUNIONECTOMY, HX OF 10/28/2007   CELLULITIS&ABSCESS OF HAND EXCEPT FINGERS&THUMB 10/23/2010   Complication of anesthesia    itching and makes me crazy   Family history of anesthesia complication    DAUGHTER HAD HIVES AND " WENT WILD "   HYPERLIPIDEMIA 10/28/2007   HYPERTENSION 10/28/2007   INSOMNIA UNSPECIFIED 10/31/2007   MYOSITIS 10/22/2010   NECK PAIN 12/03/2008   Pain in joint, site unspecified 10/07/2010   WRIST PAIN, LEFT 10/22/2010    Past Surgical History:  Procedure Laterality Date   arthroscopic surgery and rotator cuff repair left shoulder     arthroscopy right knee hx of surgery     BUNIONECTOMY     hx of   FOOT SURGERY     RIGHT   hand surgery for broken finger, remote     JOINT REPLACEMENT Right    knee   TOTAL KNEE ARTHROPLASTY  07/01/2012   Procedure: TOTAL KNEE ARTHROPLASTY;  Surgeon:  Thera Flake., MD;  Location: [facility] OR;  Service: Orthopedics;  Laterality: Right;  right total knee arthroplasty   TOTAL KNEE ARTHROPLASTY Left 06/13/2014   dr Eulah Pont   TOTAL KNEE ARTHROPLASTY Left 06/13/2014   Procedure: TOTAL KNEE ARTHROPLASTY;  Surgeon: Loreta Ave, MD;  Location: [facility] OR;  Service: Orthopedics;  Laterality: Left;    Social History:  Ambulatory  walker     reports that she has never smoked. She has never used smokeless tobacco. She reports that she does not drink alcohol and does not use drugs.     Family History:  Family History  Problem Relation Age of Onset   Cancer Brother        lung cancer   Coronary artery disease Other    Heart attack Other     Cancer Sister        Lung, stomach   ______________________________________________________________________________________________ Allergies: Allergies  Allergen Reactions   Ibuprofen Anaphylaxis   Percocet [Oxycodone-Acetaminophen]     nausea   Simvastatin Other (See Comments)    muscle aches   2,4-D Dimethylamine Other (See Comments)   Diltiazem Hcl     REACTION: ANGIO EDEMA   Hydrochlorothiazide     REACTION: SWELLING   Lisinopril     REACTION: SWELLING   Valacyclovir Other (See Comments)   Verapamil     REACTION: SWELLING     Prior to Admission medications   Medication Sig Start Date End Date Taking? Authorizing Provider  acetaminophen (TYLENOL) 650 MG CR tablet Take 650 mg by mouth every 8 (eight) hours as needed for pain.    [provider]  amLODipine (NORVASC) 10 MG tablet Take 1 tablet (10 mg total) by mouth daily. 01/07/23   Briana Grandchild, MD  atenolol (TENORMIN) 100 MG tablet Take 1 tablet (100 mg total) by mouth daily. 01/07/23   Briana Grandchild, MD  atorvastatin (LIPITOR) 10 MG tablet TAKE 1 TABLET EVERY DAY 02/22/23   Briana Grandchild, MD  b complex vitamins tablet Take 1 tablet by mouth daily.    [provider]  Cholecalciferol (VITAMIN D) 2000 UNITS tablet Take 2,000 Units by mouth daily.    [provider]  dorzolamide-timolol (COSOPT) 22.3-6.8 MG/ML ophthalmic solution Place 1 drop into both eyes 2 (two) times daily.    [provider]  hydroxychloroquine (PLAQUENIL) 200 MG tablet Take 200 mg by mouth 2 (two) times daily.    Zenovia Jordan, MD  hydrOXYzine (ATARAX) 10 MG tablet TAKE 1 TABLET(10 MG) BY MOUTH EVERY 8 HOURS AS NEEDED Patient taking differently: Take 10 mg by mouth See admin instructions. TAKE 1 TABLET(10 MG) BY MOUTH EVERY 8 HOURS AS NEEDED 03/03/23   Georgina Quint, MD  latanoprost (XALATAN) 0.005 % ophthalmic solution Place 1 drop into both eyes at bedtime.    [provider]  Netarsudil  Dimesylate (RHOPRESSA) 0.02 % SOLN Place 1 drop into the right eye at bedtime.    [provider]  promethazine (PHENERGAN) 12.5 MG tablet TAKE 1 TABLET BY MOUTH EVERY 6 HOURS AS NEEDED FOR NAUSEA OR VOMITING 05/26/23   Briana Grandchild, MD  traMADol (ULTRAM) 50 MG tablet TAKE 1 TABLET EVERY 12 HOURS AS NEEDED FOR MODERATE PAIN Patient taking differently: Take 50 mg by mouth See admin instructions. TAKE 1 TABLET EVERY 12 HOURS AS NEEDED FOR MODERATE PAIN 01/07/23   Briana Grandchild, MD    ___________________________________________________________________________________________________ Physical Exam:    06/23/2023    6:15  PM 06/23/2023    6:05 PM 06/23/2023    5:00 PM  Vitals with BMI  Height 5\' 2"     Systolic  163 164  Diastolic  62 68  Pulse  64 65     1. General:  in No  Acute distress   Chronically ill -appearing 2. Psychological: Alert and   Oriented 3. Head/ENT:  Dry Mucous Membranes                          Head Non traumatic, neck supple                            Poor Dentition 4. SKIN:  decreased Skin turgor,  Skin clean Dry and intact no rash    5. Heart: Regular rate and rhythm no  Murmur, no Rub or gallop 6. Lungs:  no wheezes or crackles   7. Abdomen: Soft,  non-tender, Non distended   obese  bowel sounds present 8. Lower extremities: no clubbing, cyanosis, no  edema 9. Neurologically strength 5 out of 5 in all 4 extremities cranial nerves II through XII intact dysmetria of left leg noted 10. MSK: Normal range of motion    Chart has been reviewed  ______________________________________________________________________________________________  Assessment/Plan 89 y.o. female with medical history significant of HTN, HLD, glaucoma , DM2, RA  Admitted for CVA   Present on Admission:  Acute ischemic stroke (HCC)  Hyperlipidemia with target LDL less than 130  Essential hypertension, benign  Vertigo, benign paroxysmal, bilateral  RA (rheumatoid arthritis)  (HCC)  Type II diabetes mellitus with manifestations (HCC)  Stage 3b chronic kidney disease (HCC)     Acute ischemic stroke (HCC)  - will admit based on TIA/CVA protocol, Initial Stroke scale        Monitor on Tele       MRA/MRI await  results         CTA orderd       Echo to evaluate for possible embolic source,        obtain cardiac enzymes,  ECG,   Lipid panel, TSH.        Order PT/OT evaluation.         passed swallow eval        Will make sure patient is on antiplatelet allergic to aspirin will likely need  Plavix agent and statin if LDL >70       Allow permissive Hypertension keep BP <220/120        Neurology consulted    Hyperlipidemia with target LDL less than 130 Continue Lipitor  Essential hypertension, benign Allow permissive hypertension  Vertigo, benign paroxysmal, bilateral Would benefit from PT OT prior to discharge symptomatic management  RA (rheumatoid arthritis) (HCC) Continue Plaquenil 200 mg twice a day  Type II diabetes mellitus with manifestations (HCC) Order sliding scale diet controlled  Stage 3b chronic kidney disease (HCC)  -chronic avoid nephrotoxic medications such as NSAIDs, Vanco Zosyn combo,  avoid hypotension, continue to follow renal function   Other plan as per orders.  DVT prophylaxis:  SCD     Code Status:    Code Status: Prior FULL CODE   as per patient   I had personally discussed CODE STATUS with patient and family   ACP   none    Family Communication:   Family  at  Bedside  plan of care was discussed   with  Daughter,  Diet  daibetic diet   Disposition Plan:       To home once workup is complete and patient is stable   Following barriers for discharge:                              Stroke   work up is complete                                                 Would benefit from PT/OT eval prior to DC  Ordered                   Swallow eval - SLP ordered                                      Consults called: Neurology     Admission status:  ED Disposition     ED Disposition  Admit   Condition  --   Comment  Hospital Area: [facility] [[id number]]  Level of Care: Telemetry Medical [104]  Interfacility transfer: Yes  May place patient in observation at [facility] or [facility] if equivalent level of care is available:: Yes  Covid Evaluation: Asymptomatic - no recent exposure (last 10 days) testing not required  Diagnosis: Acute ischemic stroke [facility]) [[id number]]  Admitting Physician: Hughie Closs [[id number]]  Attending Physician: Hughie Closs [phone]           Obs     Level of care     tele  indefinitely please discontinue once patient no longer qualifies COVID-19 Labs    Lab Results  Component Value Date   SARSCOV2NAA NEGATIVE 06/23/2023     Precautions: admitted as   Covid Negative     Brownie Gockel 06/23/2023, 9:52 PM    [facility]     after 2 AM please page floor coverage PA If 7AM-7PM, please contact the day team taking care of the patient using [email or web address]

## 2023-06-23 NOTE — Assessment & Plan Note (Signed)
Would benefit from PT OT prior to discharge symptomatic management

## 2023-06-23 NOTE — ED Notes (Signed)
I have just given report to [facility] and I have given report to Ward, Charity fundraiser at [facility].

## 2023-06-23 NOTE — Assessment & Plan Note (Signed)
Allow permissive hypertension 

## 2023-06-23 NOTE — Assessment & Plan Note (Signed)
-

## 2023-06-24 ENCOUNTER — Observation Stay (HOSPITAL_COMMUNITY): Payer: Medicare HMO

## 2023-06-24 DIAGNOSIS — I63511 Cerebral infarction due to unspecified occlusion or stenosis of right middle cerebral artery: Secondary | ICD-10-CM | POA: Diagnosis present

## 2023-06-24 DIAGNOSIS — Z23 Encounter for immunization: Secondary | ICD-10-CM | POA: Diagnosis present

## 2023-06-24 DIAGNOSIS — Z96653 Presence of artificial knee joint, bilateral: Secondary | ICD-10-CM | POA: Diagnosis present

## 2023-06-24 DIAGNOSIS — I639 Cerebral infarction, unspecified: Secondary | ICD-10-CM | POA: Diagnosis present

## 2023-06-24 DIAGNOSIS — E1122 Type 2 diabetes mellitus with diabetic chronic kidney disease: Secondary | ICD-10-CM | POA: Diagnosis present

## 2023-06-24 DIAGNOSIS — H409 Unspecified glaucoma: Secondary | ICD-10-CM | POA: Diagnosis present

## 2023-06-24 DIAGNOSIS — R112 Nausea with vomiting, unspecified: Secondary | ICD-10-CM | POA: Diagnosis not present

## 2023-06-24 DIAGNOSIS — E785 Hyperlipidemia, unspecified: Secondary | ICD-10-CM | POA: Diagnosis present

## 2023-06-24 DIAGNOSIS — I1310 Hypertensive heart and chronic kidney disease without heart failure, with stage 1 through stage 4 chronic kidney disease, or unspecified chronic kidney disease: Secondary | ICD-10-CM | POA: Diagnosis present

## 2023-06-24 DIAGNOSIS — R42 Dizziness and giddiness: Secondary | ICD-10-CM | POA: Diagnosis present

## 2023-06-24 DIAGNOSIS — Z886 Allergy status to analgesic agent status: Secondary | ICD-10-CM | POA: Diagnosis not present

## 2023-06-24 DIAGNOSIS — Z8249 Family history of ischemic heart disease and other diseases of the circulatory system: Secondary | ICD-10-CM | POA: Diagnosis not present

## 2023-06-24 DIAGNOSIS — G9389 Other specified disorders of brain: Secondary | ICD-10-CM | POA: Diagnosis present

## 2023-06-24 DIAGNOSIS — H53462 Homonymous bilateral field defects, left side: Secondary | ICD-10-CM

## 2023-06-24 DIAGNOSIS — Z8673 Personal history of transient ischemic attack (TIA), and cerebral infarction without residual deficits: Secondary | ICD-10-CM | POA: Diagnosis not present

## 2023-06-24 DIAGNOSIS — R29705 NIHSS score 5: Secondary | ICD-10-CM | POA: Diagnosis present

## 2023-06-24 DIAGNOSIS — H8113 Benign paroxysmal vertigo, bilateral: Secondary | ICD-10-CM | POA: Diagnosis present

## 2023-06-24 DIAGNOSIS — Z8616 Personal history of COVID-19: Secondary | ICD-10-CM | POA: Diagnosis not present

## 2023-06-24 DIAGNOSIS — Z79899 Other long term (current) drug therapy: Secondary | ICD-10-CM | POA: Diagnosis not present

## 2023-06-24 DIAGNOSIS — I5030 Unspecified diastolic (congestive) heart failure: Secondary | ICD-10-CM | POA: Diagnosis not present

## 2023-06-24 DIAGNOSIS — N1832 Chronic kidney disease, stage 3b: Secondary | ICD-10-CM | POA: Diagnosis present

## 2023-06-24 DIAGNOSIS — Z1152 Encounter for screening for COVID-19: Secondary | ICD-10-CM | POA: Diagnosis not present

## 2023-06-24 DIAGNOSIS — I6521 Occlusion and stenosis of right carotid artery: Secondary | ICD-10-CM | POA: Diagnosis present

## 2023-06-24 DIAGNOSIS — F0390 Unspecified dementia without behavioral disturbance: Secondary | ICD-10-CM | POA: Diagnosis present

## 2023-06-24 DIAGNOSIS — H5461 Unqualified visual loss, right eye, normal vision left eye: Secondary | ICD-10-CM | POA: Diagnosis present

## 2023-06-24 DIAGNOSIS — Z7902 Long term (current) use of antithrombotics/antiplatelets: Secondary | ICD-10-CM | POA: Diagnosis not present

## 2023-06-24 DIAGNOSIS — H538 Other visual disturbances: Secondary | ICD-10-CM | POA: Diagnosis present

## 2023-06-24 DIAGNOSIS — M069 Rheumatoid arthritis, unspecified: Secondary | ICD-10-CM | POA: Diagnosis present

## 2023-06-24 LAB — LIPID PANEL
Cholesterol: 176 mg/dL (ref 0–200)
HDL: 100 mg/dL (ref >40)
LDL Cholesterol: 66 mg/dL (ref 0–99)
Total CHOL/HDL Ratio: 1.8 ratio
Triglycerides: 52 mg/dL (ref <150)
VLDL: 10 mg/dL (ref 0–40)

## 2023-06-24 LAB — ECHOCARDIOGRAM COMPLETE
AV Mean grad: 5 mmHg
AV Peak grad: 8 mmHg
Ao pk vel: 1.41 m/s
Area-P 1/2: 3.37 cm2
Height: 62 in
P 1/2 time: 662 ms
S' Lateral: 2.7 cm
Weight: 2518.4 [oz_av]

## 2023-06-24 LAB — GLUCOSE, CAPILLARY
Glucose-Capillary: 99 mg/dL (ref 70–99)
Glucose-Capillary: 132 mg/dL — ABNORMAL HIGH (ref 70–99)
Glucose-Capillary: 105 mg/dL — ABNORMAL HIGH (ref 70–99)
Glucose-Capillary: 95 mg/dL (ref 70–99)

## 2023-06-24 MED ORDER — GUAIFENESIN-DM 100-10 MG/5ML PO SYRP
10.0000 mL | ORAL_SOLUTION | ORAL | Status: DC | PRN
  Administered 2023-06-24: 10 mL via ORAL
  Filled 2023-06-24: qty 10

## 2023-06-24 MED ORDER — ONDANSETRON HCL 4 MG/2ML IJ SOLN
4.0000 mg | Freq: Four times a day (QID) | INTRAMUSCULAR | Status: DC | PRN
  Administered 2023-06-24: 4 mg via INTRAVENOUS
  Filled 2023-06-24: qty 2

## 2023-06-24 MED ORDER — INFLUENZA VAC A&B SURF ANT ADJ 0.5 ML IM SUSY
0.5000 mL | PREFILLED_SYRINGE | INTRAMUSCULAR | Status: AC
  Administered 2023-06-25: 0.5 mL via INTRAMUSCULAR
  Filled 2023-06-24: qty 0.5

## 2023-06-24 MED ORDER — ATORVASTATIN CALCIUM 40 MG PO TABS
40.0000 mg | ORAL_TABLET | Freq: Every day | ORAL | Status: DC
  Administered 2023-06-25: 40 mg via ORAL
  Filled 2023-06-24: qty 1

## 2023-06-24 MED ORDER — NETARSUDIL DIMESYLATE 0.02 % OP SOLN
1.0000 [drp] | Freq: Every day | OPHTHALMIC | Status: DC
  Administered 2023-06-24: 1 [drp] via OPHTHALMIC

## 2023-06-24 NOTE — Progress Notes (Signed)
PROGRESS NOTE    MOSSIE WOWK  ZOX:[id number] DOB: 1930-12-12 DOA: 06/23/2023 PCP: Etta Grandchild, MD    Brief Narrative:  89 year old with history of glaucoma, hypertension, hyperlipidemia woke up yesterday morning and she was normal, later on after eating breakfast she felt dizzy with blurry vision.  She was recently diagnosed with COVID and since then she was having brain fog.  She was also noted to be feeling more off balance recently.  In the emergency room hemodynamically stable.  CT scan of the head with left cerebellar and right parietal lobe infarct.  MRI notable for subacute right parietal lobe infarct.  Admitted with acute/subacute stroke.   Assessment & Plan:   Acute/subacute right MCA stroke: Clinical findings, blurry vision and foggy brain. CT head findings, subacute looking right parietal stroke MRI of the brain, subacute right parietal white matter stroke Carotid Doppler, pending CT angiogram of the neck with no emergent large vessel occlusion.  65% stenosis of the proximal right ICA. 2D echocardiogram, pending today. Antiplatelet therapy, none at home.  Neurology recommended aspirin and Plavix for 21 days then aspirin alone. LDL 66.  At goal.  Continue home dose of atorvastatin. Hemoglobin A1c, pending. Therapy recommendations, pending.  Will likely benefit with rehab. Further management as per neurology.  Patient's hypertension: Blood pressures well-controlled.  Allowing permissive hypertension. Rheumatoid arthritis: On Plaquenil.  Continue. Type 2 diabetes: Well-controlled.  Not on treatment at home.  A1c pending.  Currently on sliding scale insulin.  CKD stage IIIb: Creatinine at about baseline.   DVT prophylaxis: SCD's Start: 06/23/23 1930   Code Status: Full code Family Communication: Multiple family members at the bedside Disposition Plan: Status is: Observation The patient will require care spanning > 2 midnights and should be moved to inpatient  because: Stroke workup     Consultants:  Neurology  Procedures:  None  Antimicrobials:  None   Subjective: Patient was seen and examined.  Complained of nausea earlier that is relieved with nausea medications.  Patient otherwise denies any complaints.  She has left lateral vision loss.  Patient does complains of having episodes of nausea and brain fog.  Telemetry monitor did not show any arrhythmias.  Objective: Vitals:   06/23/23 1959 06/23/23 2359 06/24/23 0404 06/24/23 0903  BP: (!) 158/61 (!) 145/59 (!) 140/52 (!) 151/59  Pulse: 66 65 62 71  Resp: 18 17 18 18   Temp: 98 F (36.7 C) 98 F (36.7 C) 97.8 F (36.6 C) 98.9 F (37.2 C)  TempSrc:  Oral Oral Oral  SpO2: 98% 97% 94% 99%  Weight:      Height:        Intake/Output Summary (Last 24 hours) at 06/24/2023 1137 Last data filed at 06/24/2023 1000 Gross per 24 hour  Intake 1053.73 ml  Output --  Net 1053.73 ml   Filed Weights   06/23/23 1302  Weight: 71.4 kg    Examination:  General exam: Appears calm and comfortable  Pleasant to conversation.  Follow-up sense of humor. Respiratory system: Clear to auscultation. Respiratory effort normal. Cardiovascular system: S1 & S2 heard, RRR.  Gastrointestinal system: Soft nontender.  Bowel sound present.   Central nervous system:  Left lateral quadrant visual loss.  Other cranial nerves are normal.  Alert and oriented. No focal neurological deficits. Extremities: Symmetric 5 x 5 power. Skin: No rashes, lesions or ulcers Psychiatry: Judgement and insight appear normal. Mood & affect appropriate.     Data Reviewed: I have personally reviewed following labs  and imaging studies  CBC: Recent Labs  Lab 06/23/23 1321  WBC 9.0  HGB 12.8  HCT 37.7  MCV 89.3  PLT 183   Basic Metabolic Panel: Recent Labs  Lab 06/23/23 1321 06/23/23 2156  NA 140 140  K 3.8 3.6  CL 104 104  CO2 28 25  GLUCOSE 119* 118*  BUN 14 7*  CREATININE 0.93 0.84  CALCIUM 9.4 9.4   MG  --  1.9  PHOS  --  3.5   GFR: Estimated Creatinine Clearance: 42 mL/min (by C-G formula based on SCr of 0.84 mg/dL). Liver Function Tests: Recent Labs  Lab 06/23/23 2156  AST 26  ALT 25  ALKPHOS 114  BILITOT 0.9  PROT 8.0  ALBUMIN 3.8   Recent Labs  Lab 06/23/23 2156  LIPASE 28   No results for input(s): "AMMONIA" in the last 168 hours. Coagulation Profile: No results for input(s): "INR", "PROTIME" in the last 168 hours. Cardiac Enzymes: Recent Labs  Lab 06/23/23 2156  CKTOTAL 116   BNP (last 3 results) No results for input(s): "PROBNP" in the last 8760 hours. HbA1C: No results for input(s): "HGBA1C" in the last 72 hours. CBG: Recent Labs  Lab 06/23/23 1339 06/23/23 2152 06/24/23 0901  GLUCAP 126* 103* 99   Lipid Profile: Recent Labs    06/24/23 0726  CHOL 176  HDL 100  LDLCALC 66  TRIG 52  CHOLHDL 1.8   Thyroid Function Tests: Recent Labs    06/23/23 2156  TSH 1.440   Anemia Panel: No results for input(s): "VITAMINB12", "FOLATE", "FERRITIN", "TIBC", "IRON", "RETICCTPCT" in the last 72 hours. Sepsis Labs: No results for input(s): "PROCALCITON", "LATICACIDVEN" in the last 168 hours.  Recent Results (from the past 240 hour(s))  SARS Coronavirus 2 by RT PCR (hospital order, performed in [facility] hospital lab) *cepheid single result test* Anterior Nasal Swab     Status: None   Collection Time: 06/23/23  1:28 PM   Specimen: Anterior Nasal Swab  Result Value Ref Range Status   SARS Coronavirus 2 by RT PCR NEGATIVE NEGATIVE Final    Comment: (NOTE) SARS-CoV-2 target nucleic acids are NOT DETECTED.  The SARS-CoV-2 RNA is generally detectable in upper and lower respiratory specimens during the acute phase of infection. The lowest concentration of SARS-CoV-2 viral copies this assay can detect is 250 copies / mL. A negative result does not preclude SARS-CoV-2 infection and should not be used as the sole basis for treatment or other patient  management decisions.  A negative result may occur with improper specimen collection / handling, submission of specimen other than nasopharyngeal swab, presence of viral mutation(s) within the areas targeted by this assay, and inadequate number of viral copies (<250 copies / mL). A negative result must be combined with clinical observations, patient history, and epidemiological information.  Fact Sheet for Patients:   RoadLapTop.co.za  Fact Sheet for Healthcare Providers: http://kim-miller.com/  This test is not yet approved or  cleared by the [place] FDA and has been authorized for detection and/or diagnosis of SARS-CoV-2 by FDA under an Emergency Use Authorization (EUA).  This EUA will remain in effect (meaning this test can be used) for the duration of the COVID-19 declaration under Section 564(b)(1) of the Act, 21 U.S.C. section 360bbb-3(b)(1), unless the authorization is terminated or revoked sooner.  Performed at [facility], [address] [place], [place] [address]          Radiology Studies: DG CHEST PORT 1 VIEW  Result Date: 06/23/2023 CLINICAL DATA:  Stroke EXAM: PORTABLE CHEST 1 VIEW COMPARISON:  05/13/2023 FINDINGS: Lungs are well expanded, symmetric, and clear. No pneumothorax or pleural effusion. Cardiac size within normal limits. Pulmonary vascularity is normal. Osseous structures are age-appropriate. No acute bone abnormality. IMPRESSION: No active disease. Electronically Signed   By: Helyn Numbers M.D.   On: 06/23/2023 23:26   MR BRAIN WO CONTRAST  Result Date: 06/23/2023 CLINICAL DATA:  Blurry vision and dizziness EXAM: MRI HEAD WITHOUT CONTRAST TECHNIQUE: Multiplanar, multiecho pulse sequences of the brain and surrounding structures were obtained without intravenous contrast. COMPARISON:  CTA head neck same day FINDINGS: Brain: Intermediate sized subacute to early chronic infarct of the  right parietal white matter. More peripherally, there is encephalomalacia with signal changes of cortical laminar necrosis. Very small subacute to chronic infarct of the posteromedial left cerebellar hemisphere. Petechial hemorrhage in the right parietal lobe along the cortex. There is multifocal hyperintense T2-weighted signal within the white matter. Generalized volume loss. Multiple old cerebellar infarcts. Midline structures are normal Vascular: Normal flow voids Skull and upper cervical spine: Normal Sinuses/Orbits: Paranasal sinuses are clear. No mastoid effusion. Normal orbits. Bilateral ocular lens replacements. Other: None IMPRESSION: 1. Intermediate sized subacute to early chronic infarct of the right parietal white matter with nearby encephalomalacia and signal changes of cortical laminar necrosis. The more central component has characteristics more consistent with subacute, where the peripheral component appears older. 2. Very small subacute to chronic infarct of the posteromedial left cerebellar hemisphere. 3. Multiple old cerebellar infarcts. Electronically Signed   By: Deatra Robinson M.D.   On: 06/23/2023 21:51   CT ANGIO HEAD NECK W WO CM  Result Date: 06/23/2023 CLINICAL DATA:  Blurry vision and dizziness EXAM: CT ANGIOGRAPHY HEAD AND NECK WITH AND WITHOUT CONTRAST TECHNIQUE: Multidetector CT imaging of the head and neck was performed using the standard protocol during bolus administration of intravenous contrast. Multiplanar CT image reconstructions and MIPs were obtained to evaluate the vascular anatomy. Carotid stenosis measurements (when applicable) are obtained utilizing NASCET criteria, using the distal internal carotid diameter as the denominator. RADIATION DOSE REDUCTION: This exam was performed according to the departmental dose-optimization program which includes automated exposure control, adjustment of the mA and/or kV according to patient size and/or use of iterative reconstruction  technique. CONTRAST:  75mL OMNIPAQUE IOHEXOL 350 MG/ML SOLN COMPARISON:  Brain MRI same day FINDINGS: CTA NECK FINDINGS Aortic arch: Calcific aortic atherosclerosis. Normal 3 vessel branching pattern. Visualized subclavian arteries are normal. Right carotid system: Normal common carotid artery. There is bulky calcification at the bifurcation causing approximately 65% stenosis of the proximal ICA. The remainder of the right ICA is normal. Left carotid system: Calcific atherosclerosis at the bifurcation but no stenosis. ICA is normal. Vertebral arteries: Left dominant. Mild multifocal atherosclerotic calcification of the left V2 and right V3 segments. Moderate narrowing of the right vertebral artery origin. Skeleton: Negative Other neck: Negative. Upper chest: Negative. Review of the MIP images confirms the above findings CTA HEAD FINDINGS Anterior circulation: --Intracranial internal carotid arteries: Bilateral atherosclerotic calcification, causing moderate stenosis of the right clinoid segment. --Anterior cerebral arteries (ACA): Normal. --Middle cerebral arteries (MCA): Normal. Posterior circulation: --Vertebral arteries: Normal --Inferior cerebellar arteries: Normal. --Basilar artery: Normal. --Superior cerebellar arteries: Normal. --Posterior cerebral arteries: Normal. Venous sinuses: As permitted by contrast timing, patent. Anatomic variants: Absent left ACA A1 segment Review of the MIP images confirms the above findings IMPRESSION: 1. No emergent large vessel occlusion. 2. Approximately 65% stenosis of  the proximal right ICA secondary to bulky calcification. 3. Moderate stenosis of the right clinoid segment of the internal carotid artery. 4. Moderate narrowing of the right vertebral artery origin. Aortic Atherosclerosis (ICD10-I70.0). Electronically Signed   By: Deatra Robinson M.D.   On: 06/23/2023 21:43   CT Head Wo Contrast  Result Date: 06/23/2023 CLINICAL DATA:  Neuro deficit, acute, stroke suspected  EXAM: CT HEAD WITHOUT CONTRAST TECHNIQUE: Contiguous axial images were obtained from the base of the skull through the vertex without intravenous contrast. RADIATION DOSE REDUCTION: This exam was performed according to the departmental dose-optimization program which includes automated exposure control, adjustment of the mA and/or kV according to patient size and/or use of iterative reconstruction technique. COMPARISON:  Head CT 08/07/21 FINDINGS: Brain: No hemorrhage. No hydrocephalus. No extra-axial fluid collection. There is prominence of the bilateral subarachnoid spaces along the frontal convexities, likely related to volume loss. Compared to prior head CT from 08/07/2021 there is a new age indeterminate infarct in the right parietal lobe (series 2, image 19). There is mild ex vacuo dilatation of the right lateral ventricular system in this region. Redemonstrated chronic right cerebellar infarct. There is also a chronic left occipital lobe infarct (series 2, image 13). Compared to prior exam there is a new left cerebellar infarct (series 2, image 5). Vascular: No hyperdense vessel or unexpected calcification. Skull: Normal. Negative for fracture or focal lesion. Sinuses/Orbits: No middle ear or mastoid effusion. Paranasal sinuses are clear. Bilateral lens replacement. Orbits are otherwise unremarkable. Other: None. IMPRESSION: 1. Age indeterminate infarct in the left cerebellar hemisphere. 2. Age indeterminate, but likely chronic, right parietal lobe infarct. Findings were discussed with Dr. Particia Nearing on 06/23/23 at 3:40 PM. Electronically Signed   By: Lorenza Cambridge M.D.   On: 06/23/2023 15:50        Scheduled Meds:  atorvastatin  10 mg Oral Daily   dorzolamide-timolol  1 drop Both Eyes BID   hydroxychloroquine  200 mg Oral BID   [START ON 06/25/2023] influenza vaccine adjuvanted  0.5 mL Intramuscular Tomorrow-1000   insulin aspart  0-5 Units Subcutaneous QHS   insulin aspart  0-9 Units Subcutaneous  TID WC   latanoprost  1 drop Both Eyes QHS   Netarsudil Dimesylate  1 drop Right Eye QHS   Continuous Infusions:  sodium chloride 75 mL/hr at 06/23/23 2252     LOS: 0 days    Time spent: 35 minutes    Dorcas Carrow, MD [facility]

## 2023-06-24 NOTE — Progress Notes (Addendum)
STROKE TEAM PROGRESS NOTE   BRIEF HPI Briana [name] is a 89 y.o. 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 Contracted COVID in late July, treated with Paxlovid. Noted L sided hemianopsia 1-2 weeks after COVID. Visited [facility] 8/26 -- per chart review, had complaints of difficulty seeing, vertigo, mental fogginess at that time. On this presentation, patient had questionable aphasia over the phone with family. CT head showed R parietal lobe infarct of white matter, subacute. CTA Head/Neck showed 65% stenosis of proximal R ICA. uestionable very small subacute-to-chronic infarct vs CSF cyst of posteromedial left cerebellar hemisphere.  SIGNIFICANT HOSPITAL EVENTS 9/11: Admission.  9/12: SLP: severe cognitive deficits. US echo and carotid doppler pending.   INTERIM HISTORY/SUBJECTIVE  On interview with family in room, patient is circumstantial but attentive to interview.  Notes dizziness, blurred vision, nausea yesterday.  Family notes that patient had questionable aphasia, and could not be understood over the phone.  The immediate history is somewhat unclear.  Per patient, she was diagnosed with COVID-19 at the end of July, for which she saw her primary physician who treated her with Paxlovid.  After that she said that she "could not see" (different from R sided glaucoma) and had brain fog and blurry vision.  (This corresponds with intermediate subacute to early chronic infarct of right parietal white matter seen on MRI).  She chose not to go to the hospital at that time.  Per MRI, noted follow-up with outpatient rheumatology 8/26. While the picture is somewhat unclear, both patient and family agree that she was off her baseline yesterday.  Appears at baseline now.  Denies headache, aphasia, confusion.  Oriented to day of week, month, year, Economist.  Memory is "pretty good" normally.  Feels a little nauseated.  At baseline, patient  is able to take care of her ADLs but has not left more than a few hours alone at home. Notes allergy to aspirin. Lives with son.   OBJECTIVE  CBC    Component Value Date/Time   WBC 9.0 06/23/2023 1321   RBC 4.22 06/23/2023 1321   HGB 12.8 06/23/2023 1321   HCT 37.7 06/23/2023 1321   PLT 183 06/23/2023 1321   MCV 89.3 06/23/2023 1321   MCH 30.3 06/23/2023 1321   MCHC 34.0 06/23/2023 1321   RDW 15.2 06/23/2023 1321   LYMPHSABS 1.4 01/07/2023 1031   MONOABS 0.6 01/07/2023 1031   EOSABS 0.3 01/07/2023 1031   BASOSABS 0.1 01/07/2023 1031    BMET    Component Value Date/Time   NA 140 06/23/2023 2156   NA 144 04/22/2017 0000   K 3.6 06/23/2023 2156   CL 104 06/23/2023 2156   CO2 25 06/23/2023 2156   GLUCOSE 118 (H) 06/23/2023 2156   BUN 7 (L) 06/23/2023 2156   BUN 20 04/22/2017 0000   CREATININE 0.84 06/23/2023 2156   CREATININE 0.95 (H) 05/29/2020 1043   CALCIUM 9.4 06/23/2023 2156   GFRNONAA >60 06/23/2023 2156   GFRNONAA 54 (L) 05/29/2020 1043    IMAGING past 24 hours DG CHEST PORT 1 VIEW  Result Date: 06/23/2023 CLINICAL DATA:  Stroke EXAM: PORTABLE CHEST 1 VIEW COMPARISON:  05/13/2023 FINDINGS: Lungs are well expanded, symmetric, and clear. No pneumothorax or pleural effusion. Cardiac size within normal limits. Pulmonary vascularity is normal. Osseous structures are age-appropriate. No acute bone abnormality. IMPRESSION: No active disease. Electronically Signed   By: Helyn Numbers M.D.   On: 06/23/2023  23:26   MR BRAIN WO CONTRAST  Result Date: 06/23/2023 CLINICAL DATA:  Blurry vision and dizziness EXAM: MRI HEAD WITHOUT CONTRAST TECHNIQUE: Multiplanar, multiecho pulse sequences of the brain and surrounding structures were obtained without intravenous contrast. COMPARISON:  CTA head neck same day FINDINGS: Brain: Intermediate sized subacute to early chronic infarct of the right parietal white matter. More peripherally, there is encephalomalacia with signal changes of  cortical laminar necrosis. Very small subacute to chronic infarct of the posteromedial left cerebellar hemisphere. Petechial hemorrhage in the right parietal lobe along the cortex. There is multifocal hyperintense T2-weighted signal within the white matter. Generalized volume loss. Multiple old cerebellar infarcts. Midline structures are normal Vascular: Normal flow voids Skull and upper cervical spine: Normal Sinuses/Orbits: Paranasal sinuses are clear. No mastoid effusion. Normal orbits. Bilateral ocular lens replacements. Other: None IMPRESSION: 1. Intermediate sized subacute to early chronic infarct of the right parietal white matter with nearby encephalomalacia and signal changes of cortical laminar necrosis. The more central component has characteristics more consistent with subacute, where the peripheral component appears older. 2. Very small subacute to chronic infarct of the posteromedial left cerebellar hemisphere. 3. Multiple old cerebellar infarcts. Electronically Signed   By: Deatra Robinson M.D.   On: 06/23/2023 21:51   CT ANGIO HEAD NECK W WO CM  Result Date: 06/23/2023 CLINICAL DATA:  Blurry vision and dizziness EXAM: CT ANGIOGRAPHY HEAD AND NECK WITH AND WITHOUT CONTRAST TECHNIQUE: Multidetector CT imaging of the head and neck was performed using the standard protocol during bolus administration of intravenous contrast. Multiplanar CT image reconstructions and MIPs were obtained to evaluate the vascular anatomy. Carotid stenosis measurements (when applicable) are obtained utilizing NASCET criteria, using the distal internal carotid diameter as the denominator. RADIATION DOSE REDUCTION: This exam was performed according to the departmental dose-optimization program which includes automated exposure control, adjustment of the mA and/or kV according to patient size and/or use of iterative reconstruction technique. CONTRAST:  75mL OMNIPAQUE IOHEXOL 350 MG/ML SOLN COMPARISON:  Brain MRI same day  FINDINGS: CTA NECK FINDINGS Aortic arch: Calcific aortic atherosclerosis. Normal 3 vessel branching pattern. Visualized subclavian arteries are normal. Right carotid system: Normal common carotid artery. There is bulky calcification at the bifurcation causing approximately 65% stenosis of the proximal ICA. The remainder of the right ICA is normal. Left carotid system: Calcific atherosclerosis at the bifurcation but no stenosis. ICA is normal. Vertebral arteries: Left dominant. Mild multifocal atherosclerotic calcification of the left V2 and right V3 segments. Moderate narrowing of the right vertebral artery origin. Skeleton: Negative Other neck: Negative. Upper chest: Negative. Review of the MIP images confirms the above findings CTA HEAD FINDINGS Anterior circulation: --Intracranial internal carotid arteries: Bilateral atherosclerotic calcification, causing moderate stenosis of the right clinoid segment. --Anterior cerebral arteries (ACA): Normal. --Middle cerebral arteries (MCA): Normal. Posterior circulation: --Vertebral arteries: Normal --Inferior cerebellar arteries: Normal. --Basilar artery: Normal. --Superior cerebellar arteries: Normal. --Posterior cerebral arteries: Normal. Venous sinuses: As permitted by contrast timing, patent. Anatomic variants: Absent left ACA A1 segment Review of the MIP images confirms the above findings IMPRESSION: 1. No emergent large vessel occlusion. 2. Approximately 65% stenosis of the proximal right ICA secondary to bulky calcification. 3. Moderate stenosis of the right clinoid segment of the internal carotid artery. 4. Moderate narrowing of the right vertebral artery origin. Aortic Atherosclerosis (ICD10-I70.0). Electronically Signed   By: Deatra Robinson M.D.   On: 06/23/2023 21:43   CT Head Wo Contrast  Result Date: 06/23/2023 CLINICAL DATA:  Neuro deficit,  acute, stroke suspected EXAM: CT HEAD WITHOUT CONTRAST TECHNIQUE: Contiguous axial images were obtained from the base  of the skull through the vertex without intravenous contrast. RADIATION DOSE REDUCTION: This exam was performed according to the departmental dose-optimization program which includes automated exposure control, adjustment of the mA and/or kV according to patient size and/or use of iterative reconstruction technique. COMPARISON:  Head CT 08/07/21 FINDINGS: Brain: No hemorrhage. No hydrocephalus. No extra-axial fluid collection. There is prominence of the bilateral subarachnoid spaces along the frontal convexities, likely related to volume loss. Compared to prior head CT from 08/07/2021 there is a new age indeterminate infarct in the right parietal lobe (series 2, image 19). There is mild ex vacuo dilatation of the right lateral ventricular system in this region. Redemonstrated chronic right cerebellar infarct. There is also a chronic left occipital lobe infarct (series 2, image 13). Compared to prior exam there is a new left cerebellar infarct (series 2, image 5). Vascular: No hyperdense vessel or unexpected calcification. Skull: Normal. Negative for fracture or focal lesion. Sinuses/Orbits: No middle ear or mastoid effusion. Paranasal sinuses are clear. Bilateral lens replacement. Orbits are otherwise unremarkable. Other: None. IMPRESSION: 1. Age indeterminate infarct in the left cerebellar hemisphere. 2. Age indeterminate, but likely chronic, right parietal lobe infarct. Findings were discussed with Dr. Particia Nearing on 06/23/23 at 3:40 PM. Electronically Signed   By: Lorenza Cambridge M.D.   On: 06/23/2023 15:50    Vitals:   06/23/23 1959 06/23/23 2359 06/24/23 0404 06/24/23 0903  BP: (!) 158/61 (!) 145/59 (!) 140/52 (!) 151/59  Pulse: 66 65 62 71  Resp: 18 17 18 18   Temp: 98 F (36.7 C) 98 F (36.7 C) 97.8 F (36.6 C) 98.9 F (37.2 C)  TempSrc:  Oral Oral Oral  SpO2: 98% 97% 94% 99%  Weight:      Height:         PHYSICAL EXAM General: No acute distress, laying in bed Psych:  Mood and affect appropriate  for situation CV: Regular rate and rhythm on monitor Respiratory:  Regular, unlabored respirations on room air GI: Soft abdomen   NEURO:  Mental Status: AA&Ox3, patient is oriented to interview but is somewhat circumstantial and conversation, occasionally tangential. Speech/Language: speech is without dysarthria or aphasia.  Naming, repetition, fluency, and comprehension intact.  Cranial Nerves:  II: Left homonymous hemianopsia III, IV, VI: EOMI V: Sensation is intact to light touch and symmetrical to face.  VII: Face is symmetrical resting and smiling VIII: hearing intact to voice. IX, X: Phonation is normal.  XII: tongue is midline without fasciculations. Motor: 5/5 strength to all muscle groups tested.  Tone: is normal and bulk is normal for age Sensation: Questionable reduction in sensation on left side, more likely left-sided neglect Coordination: FTN intact bilaterally Gait: deferred   ASSESSMENT/PLAN  Hemorrhagic R parietal infarct of white matter, subacute as well as questionable very small left cerebellar infarct vs CSF cyst   Patient appears close to baseline, some nausea. Current focal findings (left-sided inattention, hemianopsia, dizziness) are a result of subacute right parietal infarct, likely arising at simultaneously or shortly after contracting COVID in late July. We are unconvinced that cerebellar finding represents an acute infarct -- more likely CSF cyst. For future stroke prophylaxis we will begin antiplatelet monotherapy with Plavix 75 mg, as patient reports an allergy to aspirin.   Etiology:  Indeterminate as of now, workup ongoing.  Cryptogenic vs COVID-19 induced coagulation   Code Stroke CT head: Age indeterminate infarcts  of left cerebellar hemisphere and right parietal lobe (likely chronic. CTA head & neck: No LVO. 65% stenosis of proximal R ICA 2/2 bulky calcification.  MRI: Intermediate subacute/chronic R parietal infarct of white matter with nearby  encephalomalacia  Carotid Doppler: Bilateral no significant stenosis.   2D Echo: Awaiting LDL 66 HgbA1c 6.0 VTE prophylaxis - None prior to admission. Will discharge on antiplatelet monotherapy with Plavix. Therapy recommendations: Per PT/OT recs.  Disposition:  TBD  Hypertension Home meds:  Atenolol 100, Amlodipine 10. Held in hospital. Stable, 140s-150s/50s-60s Blood Pressure Goal: SBP less than 160   Hyperlipidemia Home meds:  Atorvastatin 10, resumed in hospital LDL 66, goal < 70 Continue statin at discharge  Other Stroke Risk Factors Advanced age  Hospital day # 0  I have personally obtained history,examined this patient, reviewed notes, independently viewed imaging studies, participated in medical decision making and plan of care.ROS completed by me personally and pertinent positives fully documented  I have made any additions or clarifications directly to the above note. Agree with note above.  Patient presented with transient episode yesterday of dizziness and some speech difficulties vision difficulties expiratory resolved possibly a TIA but appears to have COVID-19 infection end of July at that time she had some vision difficulties but did not seek evaluation.  MRI scan shows a subacute to chronic hemorrhagic right parietal occipital infarct.  Cerebellar infarct is reported by radiologist but to my review it appears to be benign CSF collection.  Stroke etiology is likely cryptogenic with COVID-19 related hypercoagulability could be hypercoagulable.  Recommend continue ongoing stroke workup.  Aggressive risk factor modification.  Since patient is allergic to aspirin hence Plavix alone.  Patient advised not to drive.  She will need 30-day heart monitor at discharge to look for paroxysmal A-fib.  Mobilize out of bed.  Therapy consults.  Long discussion with patient and 2 daughters and son at the bedside and answered questions.  Greater than 50% time during this 50-minute visit was  spent in counseling and coordination of care and discussion with patient and family and care team and answering questions. Delia Heady, MD Medical Director [facility] Pager: [phone] 06/24/2023 2:35 PM   To contact Stroke Continuity provider, please refer to [email or web address]. After hours, contact General Neurology

## 2023-06-24 NOTE — Progress Notes (Signed)
   06/24/23 1159  Spiritual Encounters  Type of Visit Follow up  Care provided to: Pt and family  Reason for visit Advance directives  Advance Directives (For Healthcare)  Would patient like information on creating a medical advance directive? Yes (Inpatient - patient defers creating a medical advance directive and declines information at this time)   Follow up to prior visit to discuss advance directive. Discussed with family. Family wants to discuss further amongst themselves as patient is interested in living will and family inquiring as to what patient's desires are. Also provided prayer and spiritual care to patient and family.

## 2023-06-24 NOTE — TOC Initial Note (Signed)
Transition of Care [facility]) - Initial/Assessment Note    Patient Details  Name: Briana [name] MRN: [id number] Date of Birth: 1931/09/10  Transition of Care [facility] [place] [facility]) CM/SW Contact:    Kermit Balo, RN Phone Number: 06/24/2023, 12:01 PM  Clinical Narrative:                  Pt is from home with her son. He is able to physically assist her at home. Daughter assists with baths.  Daughter and son manage her medications at home.  Family provides needed transportation. TOC awaiting therapy evals.    Expected Discharge Plan: Home/Self Care Barriers to Discharge: Continued Medical Work up   Patient Goals and CMS Choice   CMS Medicare.gov Compare Post Acute Care list provided to:: Patient Represenative (must comment) Choice offered to / list presented to : Adult Children, Patient      Expected Discharge Plan and Services   Discharge Planning Services: CM Consult   Living arrangements for the past 2 months: Single Family Home                                      Prior Living Arrangements/Services Living arrangements for the past 2 months: Single Family Home Lives with:: Adult Children Patient language and need for interpreter reviewed:: Yes Do you feel safe going back to the place where you live?: Yes        Care giver support system in place?: Yes (comment) Current home services: DME (cane/ walker/ shower chair/ wheelchair) Criminal Activity/Legal Involvement Pertinent to Current Situation/Hospitalization: No - Comment as needed  Activities of Daily Living Home Assistive Devices/Equipment: Cane (specify quad or straight), Wheelchair, Environmental consultant (specify type) Biochemist, clinical) ADL Screening (condition at time of admission) Patient's cognitive ability adequate to safely complete daily activities?: Yes Is the patient deaf or have difficulty hearing?: No Does the patient have difficulty seeing, even when wearing glasses/contacts?: Yes Does the patient have difficulty  concentrating, remembering, or making decisions?: No Patient able to express need for assistance with ADLs?: Yes Does the patient have difficulty dressing or bathing?: Yes Independently performs ADLs?: Yes (appropriate for developmental age) Does the patient have difficulty walking or climbing stairs?: Yes Weakness of Legs: Left Weakness of Arms/Hands: Left  Permission Sought/Granted                  Emotional Assessment Appearance:: Appears stated age Attitude/Demeanor/Rapport: Engaged Affect (typically observed): Accepting Orientation: : Oriented to Self, Oriented to Place   Psych Involvement: No (comment)  Admission diagnosis:  Dizziness [R42] Acute ischemic stroke [facility]) [I63.9] Cerebrovascular accident (CVA), unspecified mechanism (HCC) [I63.9] Nausea and vomiting, unspecified vomiting type [R11.2] Patient Active Problem List   Diagnosis Date Noted   Acute ischemic stroke (HCC) 06/23/2023   Acute bronchitis due to COVID-19 virus 05/13/2023   Allergic contact dermatitis due to drugs in contact with skin 04/30/2022   Abnormal electrocardiogram (ECG) (EKG) 04/30/2022   Body mass index (BMI) 31.0-31.9, adult 11/21/2021   Other long term (current) drug therapy 11/21/2021   Sciatica 11/21/2021   Stage 3b chronic kidney disease (HCC) 05/30/2020   Type II diabetes mellitus with manifestations (HCC) 12/28/2019   Vertigo, benign paroxysmal, bilateral 08/15/2019   Left lumbar radiculitis 03/07/2017   Essential hypertension, benign 06/19/2014   RA (rheumatoid arthritis) (HCC) 06/19/2014   Constipation due to opioid therapy 06/19/2014   Encounter for general adult medical examination with  abnormal findings 05/30/2012   INSOMNIA UNSPECIFIED 10/31/2007   Hyperlipidemia with target LDL less than 130 10/28/2007   Seasonal allergic rhinitis 10/28/2007   DJD (degenerative joint disease) of knee 10/28/2007   PCP:  Etta Grandchild, MD Pharmacy:   [facility] -  [place], [place] - [address] [place] [address] Phone: [phone] Fax: [phone]  [id number] - [place], [place] - [address] AT [facility] OF [address] [place] [address] Phone: [phone] Fax: [phone]     Social Determinants of Health (SDOH) Social History: SDOH Screenings   Food Insecurity: No Food Insecurity (06/24/2023)  Housing: Low Risk  (06/24/2023)  Transportation Needs: No Transportation Needs (06/24/2023)  Utilities: Not At Risk (06/24/2023)  Alcohol Screen: Low Risk  (12/10/2022)  Depression (PHQ2-9): Low Risk  (03/03/2023)  Financial Resource Strain: Low Risk  (12/10/2022)  Physical Activity: Insufficiently Active (12/10/2022)  Social Connections: Moderately Integrated (12/10/2022)  Stress: No Stress Concern Present (12/10/2022)  Tobacco Use: Low Risk  (06/23/2023)   SDOH Interventions:     Readmission Risk Interventions     No data to display

## 2023-06-24 NOTE — Evaluation (Signed)
Physical Therapy Evaluation Patient Details Name: Briana [name] MRN: [id number] DOB: Sep 23, 1931 Today's Date: 06/24/2023  History of Present Illness  89 y.o. female admitted  9/11 with dizziness and blurred vision. Dx MRI notable for subacute right parietal lobe infarct.  Admitted with acute/subacute stroke. PMHx: glaucoma, hypertension, hyperlipidemia  Clinical Impression  Pt admitted with above diagnosis. Patient mobilizing close to baseline with RW for support. Family has been assisting lightly at home since she had COVID in August. Patient able to ambulate with CGA using RW 60 feet. Distance limited by onset of dizziness, nausea, and dry heaving + bowel urgency. Unable to track Lt and only intermittently tracking Rt visually (hemianopsia vs difficulty following commands,) but could track vertically with large amplitude saccades. Limited assessment of end range nystagmus, no resting nystagmus noted. Suspect family can provide adequate support for pt at d/c as she is moving very close to baseline per reports and only needed light assist with transfers. HHPT recommended for follow-up and should quickly progress to outpatient neuro-rehabilitation. Pt currently with functional limitations due to the deficits listed below (see PT Problem List). Pt will benefit from acute skilled PT to increase their independence and safety with mobility to allow discharge.           If plan is discharge home, recommend the following: A little help with walking and/or transfers;A little help with bathing/dressing/bathroom;Assistance with cooking/housework;Help with stairs or ramp for entrance;Assist for transportation   Can travel by private vehicle     yes    Equipment Recommendations None recommended by PT     Functional Status Assessment Patient has had a recent decline in their functional status and demonstrates the ability to make significant improvements in function in a reasonable and predictable amount  of time.     Precautions / Restrictions Precautions Precautions: Fall Restrictions Weight Bearing Restrictions: No      Mobility  Bed Mobility Overal bed mobility: Modified Independent             General bed mobility comments: Extra time, no assist needed.    Transfers Overall transfer level: Needs assistance Equipment used: Rolling walker (2 wheels), 1 person hand held assist Transfers: Sit to/from Stand, Bed to chair/wheelchair/BSC Sit to Stand: Min assist, Contact guard assist   Step pivot transfers: Min assist       General transfer comment: Min assist for boost and balance, Stable once holding RW for support when rising from bed. CGA from recliner, Min assist from [facility] with hand held support. Min assist single UE support for step pivot transfer BSC<>recliner.    Ambulation/Gait Ambulation/Gait assistance: Contact guard assist Gait Distance (Feet): 60 Feet Assistive device: Rolling walker (2 wheels) Gait Pattern/deviations: Step-through pattern, Decreased stride length, Trunk flexed, Narrow base of support, Shuffle Gait velocity: decr Gait velocity interpretation: <1.31 ft/sec, indicative of household ambulator Pre-gait activities: static march General Gait Details: ambulates with CGA, cues for RW control and awareness of suroundings. Family reports gait looks about at baseline. There was not buckling noted. Slow with turns and navigating congested areas. Needs cues to scan for obstacles.  Stairs            Wheelchair Mobility     Tilt Bed    Modified Rankin (Stroke Patients Only) Modified Rankin (Stroke Patients Only) Pre-Morbid Rankin Score: Moderate disability Modified Rankin: Moderately severe disability     Balance Overall balance assessment: Needs assistance Sitting-balance support: No upper extremity supported, Feet supported Sitting balance-Leahy Scale: Good  Standing balance support: No upper extremity supported, During functional  activity Standing balance-Leahy Scale: Fair Standing balance comment: Stand unsupport during pericare                             Pertinent Vitals/Pain Pain Assessment Pain Assessment: No/denies pain    Home Living Family/patient expects to be discharged to:: Private residence Living Arrangements: Children Available Help at Discharge: Family;Available 24 hours/day Type of Home: House Home Access: Stairs to enter Entrance Stairs-Rails: Doctor, general practice of Steps: 5-6 (smaller steps)   Home Layout: One level Home Equipment: Shower seat;Grab bars - tub/shower;Rolling Walker (2 wheels);Rollator (4 wheels);Cane - single point;Wheelchair - manual      Prior Function Prior Level of Function : Independent/Modified Independent;Driving             Mobility Comments: Uses RW primarily to ambulate ADLs Comments: Since she had COVID in August, family has been helping her get her nightgown on in the evening and preping meals.     Extremity/Trunk Assessment   Upper Extremity Assessment Upper Extremity Assessment: Defer to OT evaluation    Lower Extremity Assessment Lower Extremity Assessment: Generalized weakness;LLE deficits/detail LLE Deficits / Details: hip flexion 4/5 on Lt, 4+/5 on Rt LLE Sensation:  (Reports equal bil LEs.)       Communication   Communication Communication: No apparent difficulties  Cognition Arousal: Alert Behavior During Therapy: WFL for tasks assessed/performed Overall Cognitive Status: Within Functional Limits for tasks assessed                                          General Comments General comments (skin integrity, edema, etc.): Became dizzy and nauseous at the end of ambulatory bout. Dry heaving. improved after sitting for a while. Urgent BM needs, assisted to [facility] and helped with peri-care.    Exercises General Exercises - Lower Extremity Ankle Circles/Pumps: AROM, Both, 10 reps, Seated Quad  Sets: Strengthening, Both, 10 reps, Seated   Assessment/Plan    PT Assessment Patient needs continued PT services  PT Problem List Decreased strength;Decreased activity tolerance;Decreased balance;Decreased mobility;Decreased coordination       PT Treatment Interventions DME instruction;Gait training;Stair training;Functional mobility training;Therapeutic activities;Therapeutic exercise;Balance training;Neuromuscular re-education;Patient/family education    PT Goals (Current goals can be found in the Care Plan section)  Acute Rehab PT Goals Patient Stated Goal: Get well PT Goal Formulation: With patient/family Time For Goal Achievement: 07/08/23 Potential to Achieve Goals: Good    Frequency Min 1X/week     Co-evaluation               AM-PAC PT "6 Clicks" Mobility  Outcome Measure Help needed turning from your back to your side while in a flat bed without using bedrails?: None Help needed moving from lying on your back to sitting on the side of a flat bed without using bedrails?: None Help needed moving to and from a bed to a chair (including a wheelchair)?: A Little Help needed standing up from a chair using your arms (e.g., wheelchair or bedside chair)?: A Little Help needed to walk in hospital room?: A Little Help needed climbing 3-5 steps with a railing? : A Lot 6 Click Score: 19    End of Session Equipment Utilized During Treatment: Gait belt Activity Tolerance: Patient tolerated treatment well Patient left: in chair;with call bell/phone  within reach;with chair alarm set;with family/visitor present;with SCD's reapplied Nurse Communication: Mobility status (nausea) PT Visit Diagnosis: Unsteadiness on feet (R26.81);Other abnormalities of gait and mobility (R26.89);Other symptoms and signs involving the nervous system (R29.898);Dizziness and giddiness (R42);Hemiplegia and hemiparesis Hemiplegia - Right/Left: Left Hemiplegia - caused by: Unspecified    Time:  8295-6213 PT Time Calculation (min) (ACUTE ONLY): 45 min   Charges:   PT Evaluation $PT Eval Low Complexity: 1 Low PT Treatments $Gait Training: 8-22 mins $Therapeutic Activity: 8-22 mins PT General Charges $$ ACUTE PT VISIT: 1 Visit         Kathlyn Sacramento, PT, DPT [facility]  Rehabilitation Services Physical Therapist Office: [phone] Website: [email or web address]   Berton Mount 06/24/2023, 1:09 PM

## 2023-06-24 NOTE — Progress Notes (Signed)
Carotid artery duplex has been completed. Preliminary results can be found in CV Proc through chart review.   06/24/23 1:53 PM Olen Cordial RVT

## 2023-06-24 NOTE — Evaluation (Signed)
Occupational Therapy Evaluation Patient Details Name: Briana [name] MRN: [id number] DOB: Oct 26, 1930 Today's Date: 06/24/2023   History of Present Illness 89 y.o. female admitted  9/11 with dizziness and blurred vision. Dx MRI notable for subacute right parietal lobe infarct.  Admitted with acute/subacute stroke. PMHx: glaucoma, hypertension, hyperlipidemia   Clinical Impression   PTA pt living at home with family, who have been providing more assistance since pt had Covid in August. Pt currently requires min A with mobility and ADL due to below listed deficits. Recommend HHOT at DC. Family very supportive and able to assist as needed. Acute OT to follow.        If plan is discharge home, recommend the following: A little help with walking and/or transfers;A little help with bathing/dressing/bathroom;Assistance with cooking/housework;Direct supervision/assist for medications management;Direct supervision/assist for financial management;Help with stairs or ramp for entrance    Functional Status Assessment  Patient has had a recent decline in their functional status and demonstrates the ability to make significant improvements in function in a reasonable and predictable amount of time.  Equipment Recommendations  None recommended by OT    Recommendations for Other Services       Precautions / Restrictions Precautions Precautions: Fall Restrictions Weight Bearing Restrictions: No      Mobility Bed Mobility               General bed mobility comments: OOB in chair    Transfers Overall transfer level: Needs assistance Equipment used: Rolling walker (2 wheels), 1 person hand held assist Transfers: Sit to/from Stand, Bed to chair/wheelchair/BSC Sit to Stand: Contact guard assist     Step pivot transfers: Contact guard assist            Balance Overall balance assessment: Needs assistance (daughter states her Mom has a fear of falling and is very good about letting  family assist) Sitting-balance support: No upper extremity supported, Feet supported Sitting balance-Leahy Scale: Good     Standing balance support: No upper extremity supported, During functional activity Standing balance-Leahy Scale: Fair Standing balance comment: Stand unsupport during pericare                           ADL either performed or assessed with clinical judgement   ADL Overall ADL's : Needs assistance/impaired Eating/Feeding: Set up Eating/Feeding Details (indicate cue type and reason): PREFERS [employer] Grooming: Set up   Upper Body Bathing: Set up;Supervision/ safety;Sitting   Lower Body Bathing: Minimal assistance;Sit to/from stand   Upper Body Dressing : Minimal assistance   Lower Body Dressing: Minimal assistance   Toilet Transfer: Contact guard assist           Functional mobility during ADLs: Contact guard assist General ADL Comments: apPears close to baseline after having Covid - daughter states she has required more assistance since that time;more diffiiculty dressing; educated daughter on safe use of Gait belt; Educated on home safety     Vision Baseline Vision/History: 1 Wears glasses;3 Glaucoma Vision Assessment?: Yes Eye Alignment: Within Functional Limits Ocular Range of Motion: Within Functional Limits Alignment/Gaze Preference: Within Defined Limits Tracking/Visual Pursuits: Decreased smoothness of horizontal tracking;Decreased smoothness of vertical tracking Saccades: Decreased speed of saccadic movement Visual Fields:  (no apparent deficits) Additional Comments: poor visual attention however distracted by many people in room     Perception Perception:  (using LUE spontaneously; locating items/objects on L)       Praxis  Pertinent Vitals/Pain Pain Assessment Pain Assessment: No/denies pain     Extremity/Trunk Assessment Upper Extremity Assessment Upper Extremity Assessment: Right hand dominant;LUE  deficits/detail RUE Deficits / Details: minimally weaker than R LUE Deficits / Details: mild incoordinaiton noted however using functionally   Lower Extremity Assessment Lower Extremity Assessment: Defer to PT evaluation LLE Deficits / Details: hip flexion 4/5 on Lt, 4+/5 on Rt LLE Sensation:  (Reports equal bil LEs.)       Communication Communication Communication: No apparent difficulties   Cognition Arousal: Alert Behavior During Therapy: WFL for tasks assessed/performed Overall Cognitive Status: History of cognitive impairments - at baseline (Family reports increased confusion at times since having Covid/taking paxlovid)                                 General Comments: daughter reports increased difficulty dialing phone numbers     General Comments  Became dizzy and nauseous at the end of ambulatory bout. Dry heaving. improved after sitting for a while. Urgent BM needs, assisted to [facility] and helped with peri-care.    Exercises Exercises: Other exercises Other Exercises Other Exercises: given squeeze foal   Shoulder Instructions      Home Living Family/patient expects to be discharged to:: Private residence Living Arrangements: Children Available Help at Discharge: Family;Available 24 hours/day Type of Home: House Home Access: Stairs to enter [employer] of Steps: 5-6 (smaller steps) Entrance Stairs-Rails: Right;Left Home Layout: One level     Bathroom Shower/Tub: Chief Strategy Officer: Standard Bathroom Accessibility: Yes How Accessible: Accessible via walker Home Equipment: Shower seat;Grab bars - tub/shower;Rolling Walker (2 wheels);Rollator (4 wheels);Cane - single point;Wheelchair - manual      Lives With: Son    Prior Functioning/Environment Prior Level of Function : Independent/Modified Independent;Driving             Mobility Comments: Uses RW primarily to ambulate ADLs Comments: Since she had COVID in August,  family has been helping her get dressed; S her bathing; family assist with financial and medication management        OT Problem List: Impaired balance (sitting and/or standing);Decreased activity tolerance      OT Treatment/Interventions: Self-care/ADL training;Therapeutic exercise;DME and/or AE instruction;Neuromuscular education;Therapeutic activities;Cognitive remediation/compensation;Visual/perceptual remediation/compensation;Patient/family education;Balance training    OT Goals(Current goals can be found in the care plan section) Acute Rehab OT Goals Patient Stated Goal: go home and be more independent OT Goal Formulation: With patient/family Time For Goal Achievement: 07/08/23 Potential to Achieve Goals: Good  OT Frequency: Min 1X/week    Co-evaluation              AM-PAC OT "6 Clicks" Daily Activity     Outcome Measure Help from another person eating meals?: A Little Help from another person taking care of personal grooming?: A Little Help from another person toileting, which includes using toliet, bedpan, or urinal?: A Little Help from another person bathing (including washing, rinsing, drying)?: A Little Help from another person to put on and taking off regular upper body clothing?: A Little Help from another person to put on and taking off regular lower body clothing?: A Little 6 Click Score: 18   End of Session Nurse Communication: Mobility status  Activity Tolerance: Patient tolerated treatment well Patient left: in chair;with call bell/phone within reach;with family/visitor present  OT Visit Diagnosis: Muscle weakness (generalized) (M62.81);Low vision, both eyes (H54.2)  Time: 1240-1300 OT Time Calculation (min): 20 min Charges:  OT General Charges $OT Visit: 1 Visit OT Evaluation $OT Eval Low Complexity: 1 Low  Ting Cage, OT/L   Acute OT Clinical Specialist Acute Rehabilitation Services Pager [phone] Office [phone]    [facility] 06/24/2023, 2:37 PM

## 2023-06-24 NOTE — Consult Note (Signed)
NEUROLOGY CONSULTATION NOTE   Date of service: June 24, 2023 Patient Name: Briana [name] MRN:  [id number] DOB:  04/21/1931 Reason for consult: "foggy in her head" Requesting Provider: Hughie Closs, MD _ _ _   _ __   _ __ _ _  __ __   _ __   __ _  History of Present Illness  Briana [name] is a 89 y.o. female with PMH significant for glaucoma, HTN, HLD who woke up yesterday AM and was fine, had breakfast and shortly afterwards, felt dizzy with blurry vision. She has been feeling off for the last few days and diagnosed with COVID. Daughter felt something was off but could not point to what was wrong and so brought her to the ED. Also noticied that she has been feeling more off balance and feels like she is going to fall.  CT Head with L cerebellar and R parietal lobe infarct. She had MRI Brain which was notable for subacute R parietal lobe infarct.  LKW: unclear as patient and daughter not truly aware of her deficit. mRS: 3 tNKASE: not offered 2/2 outside window with no clear LKW. Thrombectomy: not offered 2/2 outside window with no clear LKW. NIHSS components Score: Comment  1a Level of Conscious 0[x]  1[]  2[]  3[]      1b LOC Questions 0[x]  1[]  2[]       1c LOC Commands 0[x]  1[]  2[]       2 Best Gaze 0[]  1[]  2[]       3 Visual 0[]  1[]  2[x]  3[]    L hemianopsia  4 Facial Palsy 0[x]  1[]  2[]  3[]      5a Motor Arm - left 0[x]  1[]  2[]  3[]  4[]  UN[]    5b Motor Arm - Right 0[x]  1[]  2[]  3[]  4[]  UN[]    6a Motor Leg - Left 0[x]  1[]  2[]  3[]  4[]  UN[]    6b Motor Leg - Right 0[x]  1[]  2[]  3[]  4[]  UN[]    7 Limb Ataxia 0[x]  1[]  2[]  3[]  UN[]     8 Sensory 0[]  1[]  2[x]  UN[]      9 Best Language 0[x]  1[]  2[]  3[]      10 Dysarthria 0[x]  1[]  2[]  UN[]      11 Extinct. and Inattention 0[]  1[x]  2[]       TOTAL: 5      ROS   Constitutional Denies weight loss, fever and chills.   HEENT + changes in vision with intact hearing.   Respiratory Denies SOB and cough.   CV Denies palpitations and CP   GI Denies  abdominal pain, nausea, vomiting and diarrhea.   GU Denies dysuria and urinary frequency.   MSK Denies myalgia and joint pain.   Skin Denies rash and pruritus.   Neurological Denies headache and syncope.   Psychiatric Denies recent changes in mood. Denies anxiety and depression.    Past History   Past Medical History:  Diagnosis Date   ACUTE URIS OF UNSPECIFIED SITE 11/26/2008   ALLERGIC ARTHRITIS OTHER SPECIFIED SITES 10/22/2010   OA + RA   ALLERGIC RHINITIS 10/28/2007   ARTHRITIS, KNEES, BILATERAL 10/28/2007   BACK PAIN 04/12/2009   BUNIONECTOMY, HX OF 10/28/2007   CELLULITIS&ABSCESS OF HAND EXCEPT FINGERS&THUMB 10/23/2010   Complication of anesthesia    itching and makes me crazy   Family history of anesthesia complication    DAUGHTER HAD HIVES AND " WENT WILD "   HYPERLIPIDEMIA 10/28/2007   HYPERTENSION 10/28/2007   INSOMNIA UNSPECIFIED 10/31/2007   MYOSITIS 10/22/2010   NECK PAIN 12/03/2008   Pain  in joint, site unspecified 10/07/2010   WRIST PAIN, LEFT 10/22/2010   Past Surgical History:  Procedure Laterality Date   arthroscopic surgery and rotator cuff repair left shoulder     arthroscopy right knee hx of surgery     BUNIONECTOMY     hx of   FOOT SURGERY     RIGHT   hand surgery for broken finger, remote     JOINT REPLACEMENT Right    knee   TOTAL KNEE ARTHROPLASTY  07/01/2012   Procedure: TOTAL KNEE ARTHROPLASTY;  Surgeon: Thera Flake., MD;  Location: [facility] OR;  Service: Orthopedics;  Laterality: Right;  right total knee arthroplasty   TOTAL KNEE ARTHROPLASTY Left 06/13/2014   dr Eulah Pont   TOTAL KNEE ARTHROPLASTY Left 06/13/2014   Procedure: TOTAL KNEE ARTHROPLASTY;  Surgeon: Loreta Ave, MD;  Location: [facility] OR;  Service: Orthopedics;  Laterality: Left;   Family History  Problem Relation Age of Onset   Cancer Brother        lung cancer   Coronary artery disease Other    Heart attack Other    Cancer Sister        Lung, stomach   Social History   Socioeconomic  History   Marital status: Widowed    Spouse name: Not on file   Number of children: 4   Years of education: 12   Highest education level: Not on file  Occupational History   Occupation: ASSN II 1    Employer: [employer]  Tobacco Use   Smoking status: Never   Smokeless tobacco: Never  Vaping Use   Vaping status: Never Used  Substance and Sexual Activity   Alcohol use: No   Drug use: No   Sexual activity: Not Currently  Other Topics Concern   Not on file  Social History Narrative   Married in [place] widowed on 00   4 sons, 1 daughter, 6 grandchildren, 3 great-grands   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   Social Determinants of Health   Financial Resource Strain: Low Risk  (12/10/2022)   Overall Financial Resource Strain (CARDIA)    Difficulty of Paying Living Expenses: Not hard at all  Food Insecurity: No Food Insecurity (12/10/2022)   Hunger Vital Sign    Worried About Running Out of Food in the Last Year: Never true    Ran Out of Food in the Last Year: Never true  Transportation Needs: No Transportation Needs (12/10/2022)   PRAPARE - Administrator, Civil Service (Medical): No    Lack of Transportation (Non-Medical): No  Physical Activity: Insufficiently Active (12/10/2022)   Exercise Vital Sign    Days of Exercise per Week: 7 days    Minutes of Exercise per Session: 10 min  Stress: No Stress Concern Present (12/10/2022)   [employer] of Occupational Health - Occupational Stress Questionnaire    Feeling of Stress : Only a little  Social Connections: Moderately Integrated (12/10/2022)   Social Connection and Isolation Panel [NHANES]    Frequency of Communication with Friends and Family: More than three times a week    Frequency of Social Gatherings with Friends and Family: More than three times a week    Attends Religious Services: More than 4 times per year    Active Member of [employer]  or Organizations: Yes    Attends Banker Meetings: More than 4  times per year    Marital Status: Widowed   Allergies  Allergen Reactions   Ibuprofen Anaphylaxis   Percocet [Oxycodone-Acetaminophen]     nausea   Simvastatin Other (See Comments)    muscle aches   2,4-D Dimethylamine Other (See Comments)   Diltiazem Hcl     REACTION: ANGIO EDEMA   Hydrochlorothiazide     REACTION: SWELLING   Lisinopril     REACTION: SWELLING   Valacyclovir Other (See Comments)   Verapamil     REACTION: SWELLING    Medications   Medications Prior to Admission  Medication Sig Dispense Refill Last Dose   acetaminophen (TYLENOL) 650 MG CR tablet Take 650 mg by mouth every 8 (eight) hours as needed for pain.   Past Week   amLODipine (NORVASC) 10 MG tablet Take 1 tablet (10 mg total) by mouth daily. 90 tablet 1 06/23/2023   atenolol (TENORMIN) 100 MG tablet Take 1 tablet (100 mg total) by mouth daily. 90 tablet 1 06/23/2023   atorvastatin (LIPITOR) 10 MG tablet TAKE 1 TABLET EVERY DAY 90 tablet 1 06/22/2023   b complex vitamins tablet Take 1 tablet by mouth daily.   06/22/2023   Cholecalciferol (VITAMIN D) 2000 UNITS tablet Take 2,000 Units by mouth daily.   06/22/2023   cyanocobalamin (VITAMIN B12) 1000 MCG tablet Take 1,000 mcg by mouth daily.   06/22/2023   dextromethorphan-guaiFENesin (MUCINEX DM) 30-600 MG 12hr tablet Take 1 tablet by mouth 2 (two) times daily.   06/22/2023   dorzolamide-timolol (COSOPT) 22.3-6.8 MG/ML ophthalmic solution Place 1 drop into both eyes 2 (two) times daily.   06/22/2023   fexofenadine (ALLEGRA) 180 MG tablet Take 180 mg by mouth daily.   06/22/2023   hydroxychloroquine (PLAQUENIL) 200 MG tablet Take 200 mg by mouth 2 (two) times daily.   06/23/2023   latanoprost (XALATAN) 0.005 % ophthalmic solution Place 1 drop into both eyes at bedtime.   06/22/2023   Netarsudil Dimesylate (RHOPRESSA) 0.02 % SOLN Place 1 drop into the right eye at bedtime.   06/22/2023    promethazine (PHENERGAN) 12.5 MG tablet TAKE 1 TABLET BY MOUTH EVERY 6 HOURS AS NEEDED FOR NAUSEA OR VOMITING 30 tablet 0 06/22/2023   traMADol (ULTRAM) 50 MG tablet TAKE 1 TABLET EVERY 12 HOURS AS NEEDED FOR MODERATE PAIN (Patient taking differently: Take 50 mg by mouth See admin instructions. TAKE 1 TABLET EVERY 12 HOURS AS NEEDED FOR MODERATE PAIN) 60 tablet 5 Past Week   hydrOXYzine (ATARAX) 10 MG tablet TAKE 1 TABLET(10 MG) BY MOUTH EVERY 8 HOURS AS NEEDED (Patient not taking: Reported on 06/23/2023) 30 tablet 2 Not Taking     Vitals   Vitals:   06/23/23 1815 06/23/23 1959 06/23/23 2359 06/24/23 0404  BP:  (!) 158/61 (!) 145/59 (!) 140/52  Pulse:  66 65 62  Resp:  18 17 18   Temp:  98 F (36.7 C) 98 F (36.7 C) 97.8 F (36.6 C)  TempSrc:   Oral Oral  SpO2:  98% 97% 94%  Weight:      Height: 5\' 2"  (1.575 m)        Body mass index is 28.79 kg/m.  Physical Exam   General: Laying comfortably in bed; in no acute distress.  HENT: Normal oropharynx and mucosa. Normal external appearance of ears and nose.  Neck: Supple, no pain or tenderness  CV: No JVD. No peripheral edema.  Pulmonary: Symmetric Chest rise. Normal respiratory effort.  Abdomen: Soft to touch, non-tender.  Ext: No cyanosis, edema, or deformity  Skin: No rash. Normal palpation of skin.   Musculoskeletal: Normal digits and nails by inspection. No clubbing.   Neurologic Examination  Mental status/Cognition: Alert, oriented to self, place, month but not to year, good attention.  Speech/language: Fluent, comprehension intact, object naming intact, repetition intact.  Cranial nerves:   CN II Pupils equal and reactive to light, L hemianopsia.   CN III,IV,VI EOM intact, no gaze preference or deviation, no nystagmus    CN V normal sensation in V1, V2, and V3 segments bilaterally    CN VII no asymmetry, no nasolabial fold flattening    CN VIII normal hearing to speech    CN IX & X normal palatal elevation, no uvular  deviation    CN XI 5/5 head turn and 5/5 shoulder shrug bilaterally    CN XII midline tongue protrusion    Motor:  Muscle bulk: normal, tone normal, pronator drift none tremor none Mvmt Root Nerve  Muscle Right Left Comments  SA C5/6 Ax Deltoid 5 5   EF C5/6 Mc Biceps 5 5   EE C6/7/8 Rad Triceps 5 5   WF C6/7 Med FCR     WE C7/8 PIN ECU     F Ab C8/T1 U ADM/FDI 5 5   HF L1/2/3 Fem Illopsoas 5 5   KE L2/3/4 Fem Quad 5 5   DF L4/5 D Peron Tib Ant 5 5   PF S1/2 Tibial Grc/Sol 5 5    Sensation:  Light touch Senosry loss vs extinction noted in LUE and LLE.   Pin prick    Temperature    Vibration   Proprioception    Coordination/Complex Motor:  - Finger to Nose intact BL - Heel to shin intact BL - Rapid alternating movement are normal - Gait: deferred for patient safety.  Labs   CBC:  Recent Labs  Lab 06/23/23 1321  WBC 9.0  HGB 12.8  HCT 37.7  MCV 89.3  PLT 183    Basic Metabolic Panel:  Lab Results  Component Value Date   NA 140 06/23/2023   K 3.6 06/23/2023   CO2 25 06/23/2023   GLUCOSE 118 (H) 06/23/2023   BUN 7 (L) 06/23/2023   CREATININE 0.84 06/23/2023   CALCIUM 9.4 06/23/2023   GFRNONAA >60 06/23/2023   GFRAA 63 05/29/2020   Lipid Panel:  Lab Results  Component Value Date   LDLCALC 63 01/07/2023   HgbA1c:  Lab Results  Component Value Date   HGBA1C 6.0 01/07/2023   Urine Drug Screen: No results found for: "LABOPIA", "COCAINSCRNUR", "LABBENZ", "AMPHETMU", "THCU", "LABBARB"  Alcohol Level No results found for: "ETH"  CT Head without contrast(Personally reviewed): 1. Age indeterminate infarct in the left cerebellar hemisphere. 2. Age indeterminate, but likely chronic, right parietal lobe infarct.  CT angio Head and Neck with contrast(Personally reviewed): 1. No emergent large vessel occlusion. 2. Approximately 65% stenosis of the proximal right ICA secondary to bulky calcification. 3. Moderate stenosis of the right clinoid segment of the  internal carotid artery. 4. Moderate narrowing of the right vertebral artery origin.  MRI Brain(Personally reviewed): 1. Intermediate sized subacute to early chronic infarct of the right parietal white matter with nearby encephalomalacia and signal changes of cortical laminar necrosis. The more central component has characteristics more consistent with subacute, where the peripheral component appears older. 2. Very small subacute to chronic infarct of the posteromedial left cerebellar hemisphere. 3. Multiple old cerebellar infarcts.  Impression  Briana [name] is a 89 y.o. female with PMH significant for glaucoma, HTN, HLD who woke up yesterday AM and was fine, had breakfast and shortly afterwards, felt dizzy with blurry vision and feeling like she is going to fall. Her neurologic examination is notable for L hemianopsia and L hemisensory loss vs extinction.  She was found to have subacute R parietal white matter stroke. Appears embolic. Full stroke workup is pending.  Recommendations  - Frequent Neuro checks per stroke unit protocol - Recommend obtaining TTE - Recommend obtaining Lipid panel with LDL - Please start statin if LDL > 70 - Recommend HbA1c to evaluate for diabetes and how well it is controlled. - Antithrombotic - Aspirin 81mg  daily along with plavix 75mg  daily x 21 days, followed by Aspirin 81mg  daily alone. - Recommend DVT ppx - SBP goal - permissive hypertension first 24 h < 220/110. Held home meds.  - Recommend Telemetry monitoring for arrythmia - Recommend bedside swallow screen prior to PO intake. - Stroke education booklet - Recommend PT/OT/SLP consult   ______________________________________________________________________   Thank you for the opportunity to take part in the care of this patient. If you have any further questions, please contact the neurology consultation attending.  Signed,  Erick Blinks [facility] _ _ _   _ __   _  __ _ _  __ __   _ __   __ _

## 2023-06-24 NOTE — Evaluation (Addendum)
Speech Language Pathology Evaluation Patient Details Name: Briana [name] MRN: [id number] DOB: 12-08-1930 Today's Date: 06/24/2023 Time: 8657-8469 SLP Time Calculation (min) (ACUTE ONLY): 41 min  Problem List:  Patient Active Problem List   Diagnosis Date Noted   Acute ischemic stroke (HCC) 06/23/2023   Acute bronchitis due to COVID-19 virus 05/13/2023   Allergic contact dermatitis due to drugs in contact with skin 04/30/2022   Abnormal electrocardiogram (ECG) (EKG) 04/30/2022   Body mass index (BMI) 31.0-31.9, adult 11/21/2021   Other long term (current) drug therapy 11/21/2021   Sciatica 11/21/2021   Stage 3b chronic kidney disease (HCC) 05/30/2020   Type II diabetes mellitus with manifestations (HCC) 12/28/2019   Vertigo, benign paroxysmal, bilateral 08/15/2019   Left lumbar radiculitis 03/07/2017   Essential hypertension, benign 06/19/2014   RA (rheumatoid arthritis) (HCC) 06/19/2014   Constipation due to opioid therapy 06/19/2014   Encounter for general adult medical examination with abnormal findings 05/30/2012   INSOMNIA UNSPECIFIED 10/31/2007   Hyperlipidemia with target LDL less than 130 10/28/2007   Seasonal allergic rhinitis 10/28/2007   DJD (degenerative joint disease) of knee 10/28/2007   Past Medical History:  Past Medical History:  Diagnosis Date   ACUTE URIS OF UNSPECIFIED SITE 11/26/2008   ALLERGIC ARTHRITIS OTHER SPECIFIED SITES 10/22/2010   OA + RA   ALLERGIC RHINITIS 10/28/2007   ARTHRITIS, KNEES, BILATERAL 10/28/2007   BACK PAIN 04/12/2009   BUNIONECTOMY, HX OF 10/28/2007   CELLULITIS&ABSCESS OF HAND EXCEPT FINGERS&THUMB 10/23/2010   Complication of anesthesia    itching and makes me crazy   Family history of anesthesia complication    DAUGHTER HAD HIVES AND " WENT WILD "   HYPERLIPIDEMIA 10/28/2007   HYPERTENSION 10/28/2007   INSOMNIA UNSPECIFIED 10/31/2007   MYOSITIS 10/22/2010   NECK PAIN 12/03/2008   Pain in joint, site unspecified 10/07/2010   WRIST  PAIN, LEFT 10/22/2010   Past Surgical History:  Past Surgical History:  Procedure Laterality Date   arthroscopic surgery and rotator cuff repair left shoulder     arthroscopy right knee hx of surgery     BUNIONECTOMY     hx of   FOOT SURGERY     RIGHT   hand surgery for broken finger, remote     JOINT REPLACEMENT Right    knee   TOTAL KNEE ARTHROPLASTY  07/01/2012   Procedure: TOTAL KNEE ARTHROPLASTY;  Surgeon: Thera Flake., MD;  Location: [facility] OR;  Service: Orthopedics;  Laterality: Right;  right total knee arthroplasty   TOTAL KNEE ARTHROPLASTY Left 06/13/2014   dr Eulah Pont   TOTAL KNEE ARTHROPLASTY Left 06/13/2014   Procedure: TOTAL KNEE ARTHROPLASTY;  Surgeon: Loreta Ave, MD;  Location: [facility] OR;  Service: Orthopedics;  Laterality: Left;   HPI:  89 yo adm to [facility] with dysarthria.  Past medical history positive for glaucoma, RA, hypertension, diabetes but patient denies diabetes, COVID several months ago which reportedly caused "brain fog".  Patient imaging showed small posterior medial left cerebellar hemisphere subacute CVA right parietal white matter changes acute, and old cerebellar CVAs.   Assessment / Plan / Recommendation Clinical Impression  Patient scored 7 of 25 on [employer] mental status exam indicative of severe cognitive deficits.  She reports primary MD informed her she has brain fog from her COVID several months ago and indicated that it would get better.   Strength on exam included orientation to self location and situation.  Difficulties noted in memory and problem-solving mostly.  Patient  easily distracted by internal concerns for difficulty performing on exam and can be very verbose during testing.  She does recall motivating situations for example that her breakfast was coming in 45 minutes and information MD has shared about her diagnosis.  Speech intelligibility is mildly reduced but is dialectical from conversation with family.  Daughter manages her bills,  medications, appointments etc. and reports that she goes to see her in the afternoon to help her assure she is bathing and eating.  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  Recommend follow-up SLP to maximize patient's rehabilitation from this acute stroke.  Spoke with family to advise that when important information is being shared to decrease distractions turn off the TV to allow patient to focus.  Glaucoma impairs some ability to utilize written compensation strategies.  SLP answered all questions from the family and they are agreeable for follow-up SLP at next venue of care.  Thanks for this consult    SLP Assessment  SLP Recommendation/Assessment: All further Speech Lanaguage Pathology  needs can be addressed in the next venue of care SLP Visit Diagnosis: Cognitive communication deficit (R41.841)    Recommendations for follow up therapy are one component of a multi-disciplinary discharge planning process, led by the attending physician.  Recommendations may be updated based on patient status, additional functional criteria and insurance authorization.    Follow Up Recommendations  Other (comment)    Assistance Recommended at Discharge  Intermittent Supervision/Assistance  Functional Status Assessment Patient has had a recent decline in their functional status and demonstrates the ability to make significant improvements in function in a reasonable and predictable amount of time.  Frequency and Duration           SLP Evaluation Cognition  Overall Cognitive Status: Impaired/Different from baseline Arousal/Alertness: Awake/alert Orientation Level: Oriented to person;Oriented to place;Disoriented to time;Oriented to situation Year:  (2024 with choice of 2) Memory: Impaired Memory Impairment: Retrieval deficit;Storage deficit;Prospective memory (Patient is 3 of 5 words with various categories and did not recall 2 of 5 with  multiple-choice) Problem Solving: Impaired Problem Solving Impairment: Verbal complex Executive Function:  (Patient is not his level of cognition) Safety/Judgment: Impaired       Comprehension  Auditory Comprehension Overall Auditory Comprehension: Appears within functional limits for tasks assessed Commands: Within Functional Limits Conversation: Complex Visual Recognition/Discrimination Discrimination: Not tested Reading Comprehension Reading Status:  (Patient was able to read her name with large print)    Expression Expression Primary Mode of Expression: Verbal Verbal Expression Overall Verbal Expression: Appears within functional limits for tasks assessed Initiation: No impairment Repetition: Impaired (Imprecise articulation at multisyllabic level rapid rate which is baseline per family) Level of Impairment: Phrase level Naming: Not tested Pragmatics: No impairment Interfering Components: Attention;Speech intelligibility Non-Verbal Means of Communication: Not applicable Written Expression Dominant Hand: Right Written Expression: Not tested   Oral / Motor  Oral Motor/Sensory Function Overall Oral Motor/Sensory Function: Within functional limits Motor Speech Overall Motor Speech: Appears within functional limits for tasks assessed Respiration: Within functional limits Resonance: Within functional limits Articulation: Impaired Level of Impairment: Phrase Intelligibility: Intelligibility reduced Word: 75-100% accurate Phrase: 50-74% accurate Motor Planning: Witnin functional limits Interfering Components: Premorbid status Effective Techniques: Slow rate           Rolena Infante, MS [facility] SLP Acute Rehab Services Office [phone]  Chales Abrahams 06/24/2023, 9:30 AM

## 2023-06-24 NOTE — Progress Notes (Signed)
   06/24/23 1048  Spiritual Encounters  Type of Visit Initial  Care provided to: Pt and family  Conversation partners present during encounter Physician  Referral source Family  Reason for visit Advance directives  OnCall Visit No  Mental Health Advance Directives  Does Patient Have a Mental Health Advance Directive? No   Responded to consult to create advance directive. Patient had several family members present. Provided education. During visit a group of medical staff members, including doctor entered room. Patient expressed immediate interest in doctor's report. Chaplain left patient with forms and stated chaplain will return later to further discuss. Or if family completes form have chaplain paged.

## 2023-06-24 NOTE — Plan of Care (Signed)
  Problem: Pain Managment: Goal: General experience of comfort will improve Outcome: Progressing   Problem: Safety: Goal: Ability to remain free from injury will improve Outcome: Progressing   Problem: Skin Integrity: Goal: Risk for impaired skin integrity will decrease Outcome: Progressing   

## 2023-06-25 DIAGNOSIS — I639 Cerebral infarction, unspecified: Secondary | ICD-10-CM | POA: Diagnosis not present

## 2023-06-25 LAB — GLUCOSE, CAPILLARY
Glucose-Capillary: 92 mg/dL (ref 70–99)
Glucose-Capillary: 103 mg/dL — ABNORMAL HIGH (ref 70–99)

## 2023-06-25 LAB — HEMOGLOBIN A1C
Hgb A1c MFr Bld: 6.3 % — ABNORMAL HIGH (ref 4.8–5.6)
Mean Plasma Glucose: 134 mg/dL

## 2023-06-25 MED ORDER — CLOPIDOGREL BISULFATE 75 MG PO TABS
75.0000 mg | ORAL_TABLET | Freq: Every day | ORAL | Status: DC
  Administered 2023-06-25: 75 mg via ORAL
  Filled 2023-06-25: qty 1

## 2023-06-25 MED ORDER — CLOPIDOGREL BISULFATE 75 MG PO TABS: 75.0000 mg | ORAL_TABLET | Freq: Every day | ORAL | 2 refills | Status: DC

## 2023-06-25 MED ORDER — ATORVASTATIN CALCIUM 40 MG PO TABS: 40.0000 mg | ORAL_TABLET | Freq: Every day | ORAL | 0 refills | Status: DC

## 2023-06-25 NOTE — TOC Transition Note (Signed)
Transition of Care [facility]) - CM/SW Discharge Note   Patient Details  Name: Briana [name] MRN: [id number] Date of Birth: 11/04/1930  Transition of Care [facility]) CM/SW Contact:  Kermit Balo, RN Phone Number: 06/25/2023, 3:18 PM   Clinical Narrative:    Patient is discharging home with home health through [facility]. Information on the AVS. [facility] will contact her for the first home visit.  Pt has needed DME at home.  Pt has supervision at home and transportation to home.   Final next level of care: Home w Home Health Services Barriers to Discharge: No Barriers Identified   Patient Goals and CMS Choice CMS Medicare.gov Compare Post Acute Care list provided to:: Patient Represenative (must comment) Choice offered to / list presented to : Adult Children  Discharge Placement                         Discharge Plan and Services Additional resources added to the After Visit Summary for     Discharge Planning Services: CM Consult                      HH Arranged: PT, OT [facility] Agency: [facility] Date [facility] Agency Contacted: 06/25/23   Representative spoke with at [facility] Agency: glenda  Social Determinants of Health (SDOH) Interventions SDOH Screenings   Food Insecurity: No Food Insecurity (06/24/2023)  Housing: Low Risk  (06/24/2023)  Transportation Needs: No Transportation Needs (06/24/2023)  Utilities: Not At Risk (06/24/2023)  Alcohol Screen: Low Risk  (12/10/2022)  Depression (PHQ2-9): Low Risk  (03/03/2023)  Financial Resource Strain: Low Risk  (12/10/2022)  Physical Activity: Insufficiently Active (12/10/2022)  Social Connections: Moderately Integrated (12/10/2022)  Stress: No Stress Concern Present (12/10/2022)  Tobacco Use: Low Risk  (06/23/2023)     Readmission Risk Interventions     No data to display

## 2023-06-25 NOTE — Progress Notes (Signed)
Clide Deutscher Alderfer to be D/C'd home per MD order. Discussed with the patient and all questions fully answered.  Skin clean, dry and intact without evidence of skin break down, no evidence of skin tears noted.  IV catheter discontinued intact. Site without signs and symptoms of complications. Dressing and pressure applied.  An After Visit Summary was printed and given to the patient.  Patient escorted via WC, and D/C home via private auto.  Jon Gills  06/25/2023

## 2023-06-25 NOTE — Progress Notes (Signed)
Physical Therapy Treatment Patient Details Name: Briana [name] MRN: [id number] DOB: [birth year] Today's Date: 06/25/2023   History of Present Illness 89 y.o. female admitted  9/11 with dizziness and blurred vision. Dx MRI notable for subacute right parietal lobe infarct.  Admitted with acute/subacute stroke. PMHx: glaucoma, hypertension, hyperlipidemia    PT Comments  No dizziness today. Reports dizziness has been daily for the past month or so but is much improved today. Not able to elicit with tracking, or positional changes including horizontal roll test. Ambulating in room with CGA and RW for support, as well as she did yesterday which family reported as baseline gait ability. Required a little assist to stand from EOB but stable once upright. Distance limited by urinary urgency and incontinence but agreeable to ambulate in room today. Still appropriate for HHPT with support from family at home. Will continue to follow and progress until d/c. Pt appreciative of PT visit. Patient will continue to benefit from skilled physical therapy services to further improve independence with functional mobility..    If plan is discharge home, recommend the following: A little help with walking and/or transfers;A little help with bathing/dressing/bathroom;Assistance with cooking/housework;Help with stairs or ramp for entrance;Assist for transportation   Can travel by private vehicle        Equipment Recommendations  None recommended by PT    Recommendations for Other Services       Precautions / Restrictions Precautions Precautions: Fall Restrictions Weight Bearing Restrictions: No     Mobility  Bed Mobility Overal bed mobility: Modified Independent             General bed mobility comments: Extra time but no assist. Able to roll ind.    Transfers Overall transfer level: Needs assistance Equipment used: Rolling walker (2 wheels) Transfers: Sit to/from Stand Sit to Stand: Min  assist           General transfer comment: Light min assist for boost and balance. Rocks for momentum from bed. Cues for hand placement. Performed x2. Urinary incontinence upon standing but wearing purewick.    Ambulation/Gait Ambulation/Gait assistance: Contact guard assist Gait Distance (Feet): 25 Feet Assistive device: Rolling walker (2 wheels) Gait Pattern/deviations: Step-through pattern, Decreased stride length, Narrow base of support, Shuffle Gait velocity: decr Gait velocity interpretation: <1.31 ft/sec, indicative of household ambulator   General Gait Details: CGA for safety, distance limited by urinary urgency and incontinence requesting to stay in room. Gait overall appears the same as previous visit which pt family reported as baseline. No buckling. Mild instability but able to self correct and utilized walker appropriately today. Cues for safety and awareness.   Stairs             Wheelchair Mobility     Tilt Bed    Modified Rankin (Stroke Patients Only) Modified Rankin (Stroke Patients Only) Pre-Morbid Rankin Score: Moderate disability Modified Rankin: Moderately severe disability     Balance Overall balance assessment: Needs assistance Sitting-balance support: No upper extremity supported, Feet supported Sitting balance-Leahy Scale: Good     Standing balance support: No upper extremity supported, During functional activity Standing balance-Leahy Scale: Fair Standing balance comment: Improved stability with RW for support.                            Cognition Arousal: Alert Behavior During Therapy: WFL for tasks assessed/performed Overall Cognitive Status: History of cognitive impairments - at baseline (Family reports increased confusion at times  since having Covid/taking paxlovid)                                 General Comments: Pt oriented x4. Does repeat her thoughts several times in session however.         Exercises      General Comments General comments (skin integrity, edema, etc.): Denies dizziness today. No dizziness or nystagmus with horizontal roll, or changes in position. Mild coordination deficits apparent with LLE HKS, and FNF on LUE. Able to track in all directions today. Notable left field issues however.      Pertinent Vitals/Pain Pain Assessment Pain Assessment: No/denies pain    Home Living                          Prior Function            PT Goals (current goals can now be found in the care plan section) Acute Rehab PT Goals Patient Stated Goal: Get well PT Goal Formulation: With patient/family Time For Goal Achievement: 07/08/23 Potential to Achieve Goals: Good Progress towards PT goals: Progressing toward goals    Frequency    Min 1X/week      PT Plan      Co-evaluation              AM-PAC PT "6 Clicks" Mobility   Outcome Measure  Help needed turning from your back to your side while in a flat bed without using bedrails?: None Help needed moving from lying on your back to sitting on the side of a flat bed without using bedrails?: None Help needed moving to and from a bed to a chair (including a wheelchair)?: A Little Help needed standing up from a chair using your arms (e.g., wheelchair or bedside chair)?: A Little Help needed to walk in hospital room?: A Little Help needed climbing 3-5 steps with a railing? : A Little 6 Click Score: 20    End of Session Equipment Utilized During Treatment: Gait belt Activity Tolerance: Patient tolerated treatment well Patient left: in chair;with call bell/phone within reach;with chair alarm set Nurse Communication: Mobility status PT Visit Diagnosis: Unsteadiness on feet (R26.81);Other abnormalities of gait and mobility (R26.89);Other symptoms and signs involving the nervous system (R29.898);Dizziness and giddiness (R42);Hemiplegia and hemiparesis Hemiplegia - Right/Left: Left Hemiplegia -  caused by: Unspecified     Time: 1204-1229 PT Time Calculation (min) (ACUTE ONLY): 25 min  Charges:    $Gait Training: 8-22 mins $Therapeutic Activity: 8-22 mins PT General Charges $$ ACUTE PT VISIT: 1 Visit                     Kathlyn [place], PT, DPT [facility]  Rehabilitation Services Physical Therapist Office: [phone] Website: [email or web address]    Berton Mount 06/25/2023, 12:41 PM

## 2023-06-25 NOTE — Progress Notes (Addendum)
STROKE TEAM PROGRESS NOTE   BRIEF HPI [name] is a 89 y.o. 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 Contracted COVID in late July, treated with Paxlovid. Noted L sided hemianopsia 1-2 weeks after COVID. Visited [facility] 8/26 -- per chart review, had complaints of difficulty seeing, vertigo, mental fogginess at that time. On this presentation, patient had questionable aphasia over the phone with family. CT head showed R parietal lobe infarct of white matter, subacute. CTA Head/Neck showed 65% stenosis of proximal R ICA. uestionable very small subacute-to-chronic infarct vs CSF cyst of posteromedial left cerebellar hemisphere.  SIGNIFICANT HOSPITAL EVENTS 9/11: Admission.  9/12: SLP: severe cognitive deficits. US echo and carotid doppler pending.  9/13: Carotid Doppler negative.  Echo showed: severe left atrial dilation  INTERIM HISTORY/SUBJECTIVE  Family was not in room for interview.  Patient awake, somewhat attentive to interview, limited by baseline dementia.  Explained to patient new echo findings of severe left atrial dilation, and how this raises the risk for atrial fibrillation.  Recommend discharge patient on Holter monitor for 30 days for monitoring.  Otherwise will begin Plavix 81 daily monotherapy for proximal right ICA stenosis, as patient has reported allergy to aspirin.   OBJECTIVE  CBC    Component Value Date/Time   WBC 9.0 06/23/2023 1321   RBC 4.22 06/23/2023 1321   HGB 12.8 06/23/2023 1321   HCT 37.7 06/23/2023 1321   PLT 183 06/23/2023 1321   MCV 89.3 06/23/2023 1321   MCH 30.3 06/23/2023 1321   MCHC 34.0 06/23/2023 1321   RDW 15.2 06/23/2023 1321   LYMPHSABS 1.4 01/07/2023 1031   MONOABS 0.6 01/07/2023 1031   EOSABS 0.3 01/07/2023 1031   BASOSABS 0.1 01/07/2023 1031    BMET    Component Value Date/Time   NA 140 06/23/2023 2156   NA 144 04/22/2017 0000   K 3.6 06/23/2023 2156    CL 104 06/23/2023 2156   CO2 25 06/23/2023 2156   GLUCOSE 118 (H) 06/23/2023 2156   BUN 7 (L) 06/23/2023 2156   BUN 20 04/22/2017 0000   CREATININE 0.84 06/23/2023 2156   CREATININE 0.95 (H) 05/29/2020 1043   CALCIUM 9.4 06/23/2023 2156   GFRNONAA >60 06/23/2023 2156   GFRNONAA 54 (L) 05/29/2020 1043    IMAGING past 24 hours VAS US CAROTID  Result Date: 06/24/2023 Carotid Arterial Duplex Study Patient Name:  Briana [name]  Date of Exam:   06/24/2023 Medical Rec #: [id number]        Accession #:    [id number] Date of Birth: 1931/08/23       Patient Gender: F Patient Age:   52 years Exam Location:  [facility] Procedure:      VAS US CAROTID Referring Phys: Gevena Mart --------------------------------------------------------------------------------  Indications:       CVA. Risk Factors:      Hypertension, hyperlipidemia, Diabetes. Comparison Study:  No prior studies. Performing Technologist: Chanda Busing RVT  Examination Guidelines: A complete evaluation includes B-mode imaging, spectral Doppler, color Doppler, and power Doppler as needed of all accessible portions of each vessel. Bilateral testing is considered an integral part of a complete examination. Limited examinations for reoccurring indications may be performed as noted.  Right Carotid Findings: +----------+--------+--------+--------+-----------------------+--------+           PSV cm/sEDV cm/sStenosisPlaque Description     Comments +----------+--------+--------+--------+-----------------------+--------+ CCA Prox  99      3  Summary: Right Carotid: Velocities in the right ICA are consistent with a 1-39% stenosis. Left Carotid: Velocities in the left ICA are consistent with a 1-39% stenosis. Vertebrals: Bilateral vertebral arteries demonstrate antegrade flow. *See table(s) above for measurements and observations.  Electronically signed by Heath Lark on 06/24/2023 at 10:49:01 PM.    Final    ECHOCARDIOGRAM COMPLETE  Result Date: 06/24/2023    ECHOCARDIOGRAM REPORT   Patient Name:   Briana [name] Date of Exam: 06/24/2023 Medical Rec #:  [id number]       Height:       62.0 in Accession #:    [id number]      Weight:       157.4 lb Date of Birth:  Jul 22, 1931      BSA:          1.727 m Patient Age:    89 years        BP:           139/60 mmHg Patient Gender: F               HR:           72 bpm. Exam Location:  Inpatient Procedure: 2D Echo, Cardiac Doppler and Color Doppler Indications:    CHF  History:        Patient has no prior history of Echocardiogram examinations.                 Risk Factors:Dyslipidemia, Hypertension and Diabetes.  Sonographer:    Meagan Baucom RDCS, FE, PE Referring Phys: [id number] ANASTASSIA DOUTOVA IMPRESSIONS  1. Left ventricular ejection fraction, by estimation, is 60 to 65%. The left ventricle has normal function. The left ventricle has no regional wall motion abnormalities. There is mild concentric left ventricular hypertrophy with moderate hypertrophy of the basilar  septum. Left ventricular diastolic parameters are consistent with Grade II diastolic dysfunction (pseudonormalization).  2. Right ventricular systolic function is normal. The right ventricular size is normal.  3. Left atrial size was severely dilated.  4. The mitral valve is normal in structure. No evidence of mitral valve regurgitation. No evidence of mitral stenosis. Moderate mitral annular calcification.  5. The aortic valve is tricuspid. There is mild calcification of the aortic valve. Aortic valve regurgitation is mild. Aortic valve sclerosis/calcification is present, without any evidence of aortic stenosis. Aortic regurgitation PHT measures 662 msec.  6. The inferior vena cava is normal in size with greater than 50% respiratory variability, suggesting right atrial pressure of 3 mmHg. FINDINGS  Left Ventricle: Left ventricular ejection fraction, by estimation, is 60 to 65%. The left ventricle has normal function. The left ventricle has no regional wall motion abnormalities. The left ventricular internal cavity size was normal in size. There is  mild concentric left ventricular hypertrophy. Left ventricular diastolic parameters are consistent with Grade II diastolic dysfunction (pseudonormalization). Right Ventricle: The right ventricular size is normal. No increase in right ventricular wall thickness. Right ventricular systolic function is normal. Left Atrium: Left atrial size was severely dilated. Right Atrium: Right atrial size was normal in size. Pericardium: There is no evidence of pericardial effusion. Mitral Valve: The mitral valve is normal in structure. Moderate mitral annular calcification. No evidence of mitral valve regurgitation. No evidence of mitral valve stenosis. Tricuspid Valve: The tricuspid valve is normal in structure. Tricuspid valve regurgitation is trivial. No evidence of tricuspid stenosis. Aortic Valve: The aortic valve is tricuspid. There is mild calcification of the aortic valve.  Aortic valve  Summary: Right Carotid: Velocities in the right ICA are consistent with a 1-39% stenosis. Left Carotid: Velocities in the left ICA are consistent with a 1-39% stenosis. Vertebrals: Bilateral vertebral arteries demonstrate antegrade flow. *See table(s) above for measurements and observations.  Electronically signed by Heath Lark on 06/24/2023 at 10:49:01 PM.    Final    ECHOCARDIOGRAM COMPLETE  Result Date: 06/24/2023    ECHOCARDIOGRAM REPORT   Patient Name:   Briana [name] Date of Exam: 06/24/2023 Medical Rec #:  [id number]       Height:       62.0 in Accession #:    [id number]      Weight:       157.4 lb Date of Birth:  Jul 22, 1931      BSA:          1.727 m Patient Age:    89 years        BP:           139/60 mmHg Patient Gender: F               HR:           72 bpm. Exam Location:  Inpatient Procedure: 2D Echo, Cardiac Doppler and Color Doppler Indications:    CHF  History:        Patient has no prior history of Echocardiogram examinations.                 Risk Factors:Dyslipidemia, Hypertension and Diabetes.  Sonographer:    Meagan Baucom RDCS, FE, PE Referring Phys: [id number] ANASTASSIA DOUTOVA IMPRESSIONS  1. Left ventricular ejection fraction, by estimation, is 60 to 65%. The left ventricle has normal function. The left ventricle has no regional wall motion abnormalities. There is mild concentric left ventricular hypertrophy with moderate hypertrophy of the basilar  septum. Left ventricular diastolic parameters are consistent with Grade II diastolic dysfunction (pseudonormalization).  2. Right ventricular systolic function is normal. The right ventricular size is normal.  3. Left atrial size was severely dilated.  4. The mitral valve is normal in structure. No evidence of mitral valve regurgitation. No evidence of mitral stenosis. Moderate mitral annular calcification.  5. The aortic valve is tricuspid. There is mild calcification of the aortic valve. Aortic valve regurgitation is mild. Aortic valve sclerosis/calcification is present, without any evidence of aortic stenosis. Aortic regurgitation PHT measures 662 msec.  6. The inferior vena cava is normal in size with greater than 50% respiratory variability, suggesting right atrial pressure of 3 mmHg. FINDINGS  Left Ventricle: Left ventricular ejection fraction, by estimation, is 60 to 65%. The left ventricle has normal function. The left ventricle has no regional wall motion abnormalities. The left ventricular internal cavity size was normal in size. There is  mild concentric left ventricular hypertrophy. Left ventricular diastolic parameters are consistent with Grade II diastolic dysfunction (pseudonormalization). Right Ventricle: The right ventricular size is normal. No increase in right ventricular wall thickness. Right ventricular systolic function is normal. Left Atrium: Left atrial size was severely dilated. Right Atrium: Right atrial size was normal in size. Pericardium: There is no evidence of pericardial effusion. Mitral Valve: The mitral valve is normal in structure. Moderate mitral annular calcification. No evidence of mitral valve regurgitation. No evidence of mitral valve stenosis. Tricuspid Valve: The tricuspid valve is normal in structure. Tricuspid valve regurgitation is trivial. No evidence of tricuspid stenosis. Aortic Valve: The aortic valve is tricuspid. There is mild calcification of the aortic valve.  Aortic valve  STROKE TEAM PROGRESS NOTE   BRIEF HPI [name] is a 89 y.o. 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 Contracted COVID in late July, treated with Paxlovid. Noted L sided hemianopsia 1-2 weeks after COVID. Visited [facility] 8/26 -- per chart review, had complaints of difficulty seeing, vertigo, mental fogginess at that time. On this presentation, patient had questionable aphasia over the phone with family. CT head showed R parietal lobe infarct of white matter, subacute. CTA Head/Neck showed 65% stenosis of proximal R ICA. uestionable very small subacute-to-chronic infarct vs CSF cyst of posteromedial left cerebellar hemisphere.  SIGNIFICANT HOSPITAL EVENTS 9/11: Admission.  9/12: SLP: severe cognitive deficits. US echo and carotid doppler pending.  9/13: Carotid Doppler negative.  Echo showed: severe left atrial dilation  INTERIM HISTORY/SUBJECTIVE  Family was not in room for interview.  Patient awake, somewhat attentive to interview, limited by baseline dementia.  Explained to patient new echo findings of severe left atrial dilation, and how this raises the risk for atrial fibrillation.  Recommend discharge patient on Holter monitor for 30 days for monitoring.  Otherwise will begin Plavix 81 daily monotherapy for proximal right ICA stenosis, as patient has reported allergy to aspirin.   OBJECTIVE  CBC    Component Value Date/Time   WBC 9.0 06/23/2023 1321   RBC 4.22 06/23/2023 1321   HGB 12.8 06/23/2023 1321   HCT 37.7 06/23/2023 1321   PLT 183 06/23/2023 1321   MCV 89.3 06/23/2023 1321   MCH 30.3 06/23/2023 1321   MCHC 34.0 06/23/2023 1321   RDW 15.2 06/23/2023 1321   LYMPHSABS 1.4 01/07/2023 1031   MONOABS 0.6 01/07/2023 1031   EOSABS 0.3 01/07/2023 1031   BASOSABS 0.1 01/07/2023 1031    BMET    Component Value Date/Time   NA 140 06/23/2023 2156   NA 144 04/22/2017 0000   K 3.6 06/23/2023 2156    CL 104 06/23/2023 2156   CO2 25 06/23/2023 2156   GLUCOSE 118 (H) 06/23/2023 2156   BUN 7 (L) 06/23/2023 2156   BUN 20 04/22/2017 0000   CREATININE 0.84 06/23/2023 2156   CREATININE 0.95 (H) 05/29/2020 1043   CALCIUM 9.4 06/23/2023 2156   GFRNONAA >60 06/23/2023 2156   GFRNONAA 54 (L) 05/29/2020 1043    IMAGING past 24 hours VAS US CAROTID  Result Date: 06/24/2023 Carotid Arterial Duplex Study Patient Name:  Briana [name]  Date of Exam:   06/24/2023 Medical Rec #: [id number]        Accession #:    [id number] Date of Birth: 1931/08/23       Patient Gender: F Patient Age:   52 years Exam Location:  [facility] Procedure:      VAS US CAROTID Referring Phys: Gevena Mart --------------------------------------------------------------------------------  Indications:       CVA. Risk Factors:      Hypertension, hyperlipidemia, Diabetes. Comparison Study:  No prior studies. Performing Technologist: Chanda Busing RVT  Examination Guidelines: A complete evaluation includes B-mode imaging, spectral Doppler, color Doppler, and power Doppler as needed of all accessible portions of each vessel. Bilateral testing is considered an integral part of a complete examination. Limited examinations for reoccurring indications may be performed as noted.  Right Carotid Findings: +----------+--------+--------+--------+-----------------------+--------+           PSV cm/sEDV cm/sStenosisPlaque Description     Comments +----------+--------+--------+--------+-----------------------+--------+ CCA Prox  99      3  STROKE TEAM PROGRESS NOTE   BRIEF HPI [name] is a 89 y.o. 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 Contracted COVID in late July, treated with Paxlovid. Noted L sided hemianopsia 1-2 weeks after COVID. Visited [facility] 8/26 -- per chart review, had complaints of difficulty seeing, vertigo, mental fogginess at that time. On this presentation, patient had questionable aphasia over the phone with family. CT head showed R parietal lobe infarct of white matter, subacute. CTA Head/Neck showed 65% stenosis of proximal R ICA. uestionable very small subacute-to-chronic infarct vs CSF cyst of posteromedial left cerebellar hemisphere.  SIGNIFICANT HOSPITAL EVENTS 9/11: Admission.  9/12: SLP: severe cognitive deficits. US echo and carotid doppler pending.  9/13: Carotid Doppler negative.  Echo showed: severe left atrial dilation  INTERIM HISTORY/SUBJECTIVE  Family was not in room for interview.  Patient awake, somewhat attentive to interview, limited by baseline dementia.  Explained to patient new echo findings of severe left atrial dilation, and how this raises the risk for atrial fibrillation.  Recommend discharge patient on Holter monitor for 30 days for monitoring.  Otherwise will begin Plavix 81 daily monotherapy for proximal right ICA stenosis, as patient has reported allergy to aspirin.   OBJECTIVE  CBC    Component Value Date/Time   WBC 9.0 06/23/2023 1321   RBC 4.22 06/23/2023 1321   HGB 12.8 06/23/2023 1321   HCT 37.7 06/23/2023 1321   PLT 183 06/23/2023 1321   MCV 89.3 06/23/2023 1321   MCH 30.3 06/23/2023 1321   MCHC 34.0 06/23/2023 1321   RDW 15.2 06/23/2023 1321   LYMPHSABS 1.4 01/07/2023 1031   MONOABS 0.6 01/07/2023 1031   EOSABS 0.3 01/07/2023 1031   BASOSABS 0.1 01/07/2023 1031    BMET    Component Value Date/Time   NA 140 06/23/2023 2156   NA 144 04/22/2017 0000   K 3.6 06/23/2023 2156    CL 104 06/23/2023 2156   CO2 25 06/23/2023 2156   GLUCOSE 118 (H) 06/23/2023 2156   BUN 7 (L) 06/23/2023 2156   BUN 20 04/22/2017 0000   CREATININE 0.84 06/23/2023 2156   CREATININE 0.95 (H) 05/29/2020 1043   CALCIUM 9.4 06/23/2023 2156   GFRNONAA >60 06/23/2023 2156   GFRNONAA 54 (L) 05/29/2020 1043    IMAGING past 24 hours VAS US CAROTID  Result Date: 06/24/2023 Carotid Arterial Duplex Study Patient Name:  Briana [name]  Date of Exam:   06/24/2023 Medical Rec #: [id number]        Accession #:    [id number] Date of Birth: 1931/08/23       Patient Gender: F Patient Age:   52 years Exam Location:  [facility] Procedure:      VAS US CAROTID Referring Phys: Gevena Mart --------------------------------------------------------------------------------  Indications:       CVA. Risk Factors:      Hypertension, hyperlipidemia, Diabetes. Comparison Study:  No prior studies. Performing Technologist: Chanda Busing RVT  Examination Guidelines: A complete evaluation includes B-mode imaging, spectral Doppler, color Doppler, and power Doppler as needed of all accessible portions of each vessel. Bilateral testing is considered an integral part of a complete examination. Limited examinations for reoccurring indications may be performed as noted.  Right Carotid Findings: +----------+--------+--------+--------+-----------------------+--------+           PSV cm/sEDV cm/sStenosisPlaque Description     Comments +----------+--------+--------+--------+-----------------------+--------+ CCA Prox  99      3

## 2023-06-25 NOTE — Discharge Summary (Signed)
PSV cm/sEDV cm/sDescribeArm Pressure (mmHG)  +----------+--------+--------+--------+-------------------+ [id number]                                         +----------+--------+--------+--------+-------------------+ +---------+--------+--+--------+-+---------+ VertebralPSV cm/s42EDV cm/s8Antegrade +---------+--------+--+--------+-+---------+   Summary: Right Carotid: Velocities in the right ICA are consistent with a 1-39% stenosis. Left Carotid: Velocities in the left ICA are consistent with a 1-39% stenosis. Vertebrals: Bilateral vertebral arteries demonstrate antegrade flow. *See table(s) above for measurements and observations.  Electronically signed by Heath Lark on 06/24/2023 at 10:49:01 PM.    Final    ECHOCARDIOGRAM COMPLETE  Result Date: 06/24/2023    ECHOCARDIOGRAM REPORT   Patient Name:   Briana [name] Date of Exam: 06/24/2023 Medical Rec #:  [id number]       Height:       62.0 in Accession #:    [id number]      Weight:       157.4 lb Date of Birth:  1931/04/15      BSA:          1.727 m Patient Age:    89 years        BP:           139/60 mmHg Patient Gender: F               HR:           72 bpm. Exam Location:  Inpatient Procedure: 2D Echo, Cardiac Doppler and Color Doppler Indications:    CHF  History:        Patient has no prior history of Echocardiogram examinations.                 Risk Factors:Dyslipidemia, Hypertension and Diabetes.  Sonographer:    Meagan Baucom RDCS, FE, PE Referring Phys: [id number] ANASTASSIA DOUTOVA IMPRESSIONS  1. Left ventricular ejection fraction, by estimation, is 60 to 65%. The left ventricle has normal function. The left ventricle has no regional wall motion abnormalities. There is mild concentric left ventricular hypertrophy with moderate hypertrophy of the basilar septum. Left ventricular diastolic parameters are consistent with Grade II diastolic dysfunction (pseudonormalization).  2. Right ventricular systolic function is normal. The right ventricular size is normal.  3. Left atrial size was  severely dilated.  4. The mitral valve is normal in structure. No evidence of mitral valve regurgitation. No evidence of mitral stenosis. Moderate mitral annular calcification.  5. The aortic valve is tricuspid. There is mild calcification of the aortic valve. Aortic valve regurgitation is mild. Aortic valve sclerosis/calcification is present, without any evidence of aortic stenosis. Aortic regurgitation PHT measures 662 msec.  6. The inferior vena cava is normal in size with greater than 50% respiratory variability, suggesting right atrial pressure of 3 mmHg. FINDINGS  Left Ventricle: Left ventricular ejection fraction, by estimation, is 60 to 65%. The left ventricle has normal function. The left ventricle has no regional wall motion abnormalities. The left ventricular internal cavity size was normal in size. There is  mild concentric left ventricular hypertrophy. Left ventricular diastolic parameters are consistent with Grade II diastolic dysfunction (pseudonormalization). Right Ventricle: The right ventricular size is normal. No increase in right ventricular wall thickness. Right ventricular systolic function is normal. Left Atrium: Left atrial size was severely dilated. Right Atrium: Right atrial size was normal in size. Pericardium: There is no evidence of pericardial effusion. Mitral Valve: The mitral valve is  PSV cm/sEDV cm/sDescribeArm Pressure (mmHG)  +----------+--------+--------+--------+-------------------+ [id number]                                         +----------+--------+--------+--------+-------------------+ +---------+--------+--+--------+-+---------+ VertebralPSV cm/s42EDV cm/s8Antegrade +---------+--------+--+--------+-+---------+   Summary: Right Carotid: Velocities in the right ICA are consistent with a 1-39% stenosis. Left Carotid: Velocities in the left ICA are consistent with a 1-39% stenosis. Vertebrals: Bilateral vertebral arteries demonstrate antegrade flow. *See table(s) above for measurements and observations.  Electronically signed by Heath Lark on 06/24/2023 at 10:49:01 PM.    Final    ECHOCARDIOGRAM COMPLETE  Result Date: 06/24/2023    ECHOCARDIOGRAM REPORT   Patient Name:   Briana [name] Date of Exam: 06/24/2023 Medical Rec #:  [id number]       Height:       62.0 in Accession #:    [id number]      Weight:       157.4 lb Date of Birth:  1931/04/15      BSA:          1.727 m Patient Age:    89 years        BP:           139/60 mmHg Patient Gender: F               HR:           72 bpm. Exam Location:  Inpatient Procedure: 2D Echo, Cardiac Doppler and Color Doppler Indications:    CHF  History:        Patient has no prior history of Echocardiogram examinations.                 Risk Factors:Dyslipidemia, Hypertension and Diabetes.  Sonographer:    Meagan Baucom RDCS, FE, PE Referring Phys: [id number] ANASTASSIA DOUTOVA IMPRESSIONS  1. Left ventricular ejection fraction, by estimation, is 60 to 65%. The left ventricle has normal function. The left ventricle has no regional wall motion abnormalities. There is mild concentric left ventricular hypertrophy with moderate hypertrophy of the basilar septum. Left ventricular diastolic parameters are consistent with Grade II diastolic dysfunction (pseudonormalization).  2. Right ventricular systolic function is normal. The right ventricular size is normal.  3. Left atrial size was  severely dilated.  4. The mitral valve is normal in structure. No evidence of mitral valve regurgitation. No evidence of mitral stenosis. Moderate mitral annular calcification.  5. The aortic valve is tricuspid. There is mild calcification of the aortic valve. Aortic valve regurgitation is mild. Aortic valve sclerosis/calcification is present, without any evidence of aortic stenosis. Aortic regurgitation PHT measures 662 msec.  6. The inferior vena cava is normal in size with greater than 50% respiratory variability, suggesting right atrial pressure of 3 mmHg. FINDINGS  Left Ventricle: Left ventricular ejection fraction, by estimation, is 60 to 65%. The left ventricle has normal function. The left ventricle has no regional wall motion abnormalities. The left ventricular internal cavity size was normal in size. There is  mild concentric left ventricular hypertrophy. Left ventricular diastolic parameters are consistent with Grade II diastolic dysfunction (pseudonormalization). Right Ventricle: The right ventricular size is normal. No increase in right ventricular wall thickness. Right ventricular systolic function is normal. Left Atrium: Left atrial size was severely dilated. Right Atrium: Right atrial size was normal in size. Pericardium: There is no evidence of pericardial effusion. Mitral Valve: The mitral valve is  PSV cm/sEDV cm/sDescribeArm Pressure (mmHG)  +----------+--------+--------+--------+-------------------+ [id number]                                         +----------+--------+--------+--------+-------------------+ +---------+--------+--+--------+-+---------+ VertebralPSV cm/s42EDV cm/s8Antegrade +---------+--------+--+--------+-+---------+   Summary: Right Carotid: Velocities in the right ICA are consistent with a 1-39% stenosis. Left Carotid: Velocities in the left ICA are consistent with a 1-39% stenosis. Vertebrals: Bilateral vertebral arteries demonstrate antegrade flow. *See table(s) above for measurements and observations.  Electronically signed by Heath Lark on 06/24/2023 at 10:49:01 PM.    Final    ECHOCARDIOGRAM COMPLETE  Result Date: 06/24/2023    ECHOCARDIOGRAM REPORT   Patient Name:   Briana [name] Date of Exam: 06/24/2023 Medical Rec #:  [id number]       Height:       62.0 in Accession #:    [id number]      Weight:       157.4 lb Date of Birth:  1931/04/15      BSA:          1.727 m Patient Age:    89 years        BP:           139/60 mmHg Patient Gender: F               HR:           72 bpm. Exam Location:  Inpatient Procedure: 2D Echo, Cardiac Doppler and Color Doppler Indications:    CHF  History:        Patient has no prior history of Echocardiogram examinations.                 Risk Factors:Dyslipidemia, Hypertension and Diabetes.  Sonographer:    Meagan Baucom RDCS, FE, PE Referring Phys: [id number] ANASTASSIA DOUTOVA IMPRESSIONS  1. Left ventricular ejection fraction, by estimation, is 60 to 65%. The left ventricle has normal function. The left ventricle has no regional wall motion abnormalities. There is mild concentric left ventricular hypertrophy with moderate hypertrophy of the basilar septum. Left ventricular diastolic parameters are consistent with Grade II diastolic dysfunction (pseudonormalization).  2. Right ventricular systolic function is normal. The right ventricular size is normal.  3. Left atrial size was  severely dilated.  4. The mitral valve is normal in structure. No evidence of mitral valve regurgitation. No evidence of mitral stenosis. Moderate mitral annular calcification.  5. The aortic valve is tricuspid. There is mild calcification of the aortic valve. Aortic valve regurgitation is mild. Aortic valve sclerosis/calcification is present, without any evidence of aortic stenosis. Aortic regurgitation PHT measures 662 msec.  6. The inferior vena cava is normal in size with greater than 50% respiratory variability, suggesting right atrial pressure of 3 mmHg. FINDINGS  Left Ventricle: Left ventricular ejection fraction, by estimation, is 60 to 65%. The left ventricle has normal function. The left ventricle has no regional wall motion abnormalities. The left ventricular internal cavity size was normal in size. There is  mild concentric left ventricular hypertrophy. Left ventricular diastolic parameters are consistent with Grade II diastolic dysfunction (pseudonormalization). Right Ventricle: The right ventricular size is normal. No increase in right ventricular wall thickness. Right ventricular systolic function is normal. Left Atrium: Left atrial size was severely dilated. Right Atrium: Right atrial size was normal in size. Pericardium: There is no evidence of pericardial effusion. Mitral Valve: The mitral valve is  PSV cm/sEDV cm/sDescribeArm Pressure (mmHG)  +----------+--------+--------+--------+-------------------+ [id number]                                         +----------+--------+--------+--------+-------------------+ +---------+--------+--+--------+-+---------+ VertebralPSV cm/s42EDV cm/s8Antegrade +---------+--------+--+--------+-+---------+   Summary: Right Carotid: Velocities in the right ICA are consistent with a 1-39% stenosis. Left Carotid: Velocities in the left ICA are consistent with a 1-39% stenosis. Vertebrals: Bilateral vertebral arteries demonstrate antegrade flow. *See table(s) above for measurements and observations.  Electronically signed by Heath Lark on 06/24/2023 at 10:49:01 PM.    Final    ECHOCARDIOGRAM COMPLETE  Result Date: 06/24/2023    ECHOCARDIOGRAM REPORT   Patient Name:   Briana [name] Date of Exam: 06/24/2023 Medical Rec #:  [id number]       Height:       62.0 in Accession #:    [id number]      Weight:       157.4 lb Date of Birth:  1931/04/15      BSA:          1.727 m Patient Age:    89 years        BP:           139/60 mmHg Patient Gender: F               HR:           72 bpm. Exam Location:  Inpatient Procedure: 2D Echo, Cardiac Doppler and Color Doppler Indications:    CHF  History:        Patient has no prior history of Echocardiogram examinations.                 Risk Factors:Dyslipidemia, Hypertension and Diabetes.  Sonographer:    Meagan Baucom RDCS, FE, PE Referring Phys: [id number] ANASTASSIA DOUTOVA IMPRESSIONS  1. Left ventricular ejection fraction, by estimation, is 60 to 65%. The left ventricle has normal function. The left ventricle has no regional wall motion abnormalities. There is mild concentric left ventricular hypertrophy with moderate hypertrophy of the basilar septum. Left ventricular diastolic parameters are consistent with Grade II diastolic dysfunction (pseudonormalization).  2. Right ventricular systolic function is normal. The right ventricular size is normal.  3. Left atrial size was  severely dilated.  4. The mitral valve is normal in structure. No evidence of mitral valve regurgitation. No evidence of mitral stenosis. Moderate mitral annular calcification.  5. The aortic valve is tricuspid. There is mild calcification of the aortic valve. Aortic valve regurgitation is mild. Aortic valve sclerosis/calcification is present, without any evidence of aortic stenosis. Aortic regurgitation PHT measures 662 msec.  6. The inferior vena cava is normal in size with greater than 50% respiratory variability, suggesting right atrial pressure of 3 mmHg. FINDINGS  Left Ventricle: Left ventricular ejection fraction, by estimation, is 60 to 65%. The left ventricle has normal function. The left ventricle has no regional wall motion abnormalities. The left ventricular internal cavity size was normal in size. There is  mild concentric left ventricular hypertrophy. Left ventricular diastolic parameters are consistent with Grade II diastolic dysfunction (pseudonormalization). Right Ventricle: The right ventricular size is normal. No increase in right ventricular wall thickness. Right ventricular systolic function is normal. Left Atrium: Left atrial size was severely dilated. Right Atrium: Right atrial size was normal in size. Pericardium: There is no evidence of pericardial effusion. Mitral Valve: The mitral valve is  PSV cm/sEDV cm/sDescribeArm Pressure (mmHG)  +----------+--------+--------+--------+-------------------+ [id number]                                         +----------+--------+--------+--------+-------------------+ +---------+--------+--+--------+-+---------+ VertebralPSV cm/s42EDV cm/s8Antegrade +---------+--------+--+--------+-+---------+   Summary: Right Carotid: Velocities in the right ICA are consistent with a 1-39% stenosis. Left Carotid: Velocities in the left ICA are consistent with a 1-39% stenosis. Vertebrals: Bilateral vertebral arteries demonstrate antegrade flow. *See table(s) above for measurements and observations.  Electronically signed by Heath Lark on 06/24/2023 at 10:49:01 PM.    Final    ECHOCARDIOGRAM COMPLETE  Result Date: 06/24/2023    ECHOCARDIOGRAM REPORT   Patient Name:   Briana [name] Date of Exam: 06/24/2023 Medical Rec #:  [id number]       Height:       62.0 in Accession #:    [id number]      Weight:       157.4 lb Date of Birth:  1931/04/15      BSA:          1.727 m Patient Age:    89 years        BP:           139/60 mmHg Patient Gender: F               HR:           72 bpm. Exam Location:  Inpatient Procedure: 2D Echo, Cardiac Doppler and Color Doppler Indications:    CHF  History:        Patient has no prior history of Echocardiogram examinations.                 Risk Factors:Dyslipidemia, Hypertension and Diabetes.  Sonographer:    Meagan Baucom RDCS, FE, PE Referring Phys: [id number] ANASTASSIA DOUTOVA IMPRESSIONS  1. Left ventricular ejection fraction, by estimation, is 60 to 65%. The left ventricle has normal function. The left ventricle has no regional wall motion abnormalities. There is mild concentric left ventricular hypertrophy with moderate hypertrophy of the basilar septum. Left ventricular diastolic parameters are consistent with Grade II diastolic dysfunction (pseudonormalization).  2. Right ventricular systolic function is normal. The right ventricular size is normal.  3. Left atrial size was  severely dilated.  4. The mitral valve is normal in structure. No evidence of mitral valve regurgitation. No evidence of mitral stenosis. Moderate mitral annular calcification.  5. The aortic valve is tricuspid. There is mild calcification of the aortic valve. Aortic valve regurgitation is mild. Aortic valve sclerosis/calcification is present, without any evidence of aortic stenosis. Aortic regurgitation PHT measures 662 msec.  6. The inferior vena cava is normal in size with greater than 50% respiratory variability, suggesting right atrial pressure of 3 mmHg. FINDINGS  Left Ventricle: Left ventricular ejection fraction, by estimation, is 60 to 65%. The left ventricle has normal function. The left ventricle has no regional wall motion abnormalities. The left ventricular internal cavity size was normal in size. There is  mild concentric left ventricular hypertrophy. Left ventricular diastolic parameters are consistent with Grade II diastolic dysfunction (pseudonormalization). Right Ventricle: The right ventricular size is normal. No increase in right ventricular wall thickness. Right ventricular systolic function is normal. Left Atrium: Left atrial size was severely dilated. Right Atrium: Right atrial size was normal in size. Pericardium: There is no evidence of pericardial effusion. Mitral Valve: The mitral valve is  PSV cm/sEDV cm/sDescribeArm Pressure (mmHG)  +----------+--------+--------+--------+-------------------+ [id number]                                         +----------+--------+--------+--------+-------------------+ +---------+--------+--+--------+-+---------+ VertebralPSV cm/s42EDV cm/s8Antegrade +---------+--------+--+--------+-+---------+   Summary: Right Carotid: Velocities in the right ICA are consistent with a 1-39% stenosis. Left Carotid: Velocities in the left ICA are consistent with a 1-39% stenosis. Vertebrals: Bilateral vertebral arteries demonstrate antegrade flow. *See table(s) above for measurements and observations.  Electronically signed by Heath Lark on 06/24/2023 at 10:49:01 PM.    Final    ECHOCARDIOGRAM COMPLETE  Result Date: 06/24/2023    ECHOCARDIOGRAM REPORT   Patient Name:   Briana [name] Date of Exam: 06/24/2023 Medical Rec #:  [id number]       Height:       62.0 in Accession #:    [id number]      Weight:       157.4 lb Date of Birth:  1931/04/15      BSA:          1.727 m Patient Age:    89 years        BP:           139/60 mmHg Patient Gender: F               HR:           72 bpm. Exam Location:  Inpatient Procedure: 2D Echo, Cardiac Doppler and Color Doppler Indications:    CHF  History:        Patient has no prior history of Echocardiogram examinations.                 Risk Factors:Dyslipidemia, Hypertension and Diabetes.  Sonographer:    Meagan Baucom RDCS, FE, PE Referring Phys: [id number] ANASTASSIA DOUTOVA IMPRESSIONS  1. Left ventricular ejection fraction, by estimation, is 60 to 65%. The left ventricle has normal function. The left ventricle has no regional wall motion abnormalities. There is mild concentric left ventricular hypertrophy with moderate hypertrophy of the basilar septum. Left ventricular diastolic parameters are consistent with Grade II diastolic dysfunction (pseudonormalization).  2. Right ventricular systolic function is normal. The right ventricular size is normal.  3. Left atrial size was  severely dilated.  4. The mitral valve is normal in structure. No evidence of mitral valve regurgitation. No evidence of mitral stenosis. Moderate mitral annular calcification.  5. The aortic valve is tricuspid. There is mild calcification of the aortic valve. Aortic valve regurgitation is mild. Aortic valve sclerosis/calcification is present, without any evidence of aortic stenosis. Aortic regurgitation PHT measures 662 msec.  6. The inferior vena cava is normal in size with greater than 50% respiratory variability, suggesting right atrial pressure of 3 mmHg. FINDINGS  Left Ventricle: Left ventricular ejection fraction, by estimation, is 60 to 65%. The left ventricle has normal function. The left ventricle has no regional wall motion abnormalities. The left ventricular internal cavity size was normal in size. There is  mild concentric left ventricular hypertrophy. Left ventricular diastolic parameters are consistent with Grade II diastolic dysfunction (pseudonormalization). Right Ventricle: The right ventricular size is normal. No increase in right ventricular wall thickness. Right ventricular systolic function is normal. Left Atrium: Left atrial size was severely dilated. Right Atrium: Right atrial size was normal in size. Pericardium: There is no evidence of pericardial effusion. Mitral Valve: The mitral valve is  10 MG tablet Commonly known as: ATARAX       TAKE these medications    acetaminophen 650 MG CR tablet Commonly known as: TYLENOL Take 650 mg by mouth every 8 (eight) hours as needed for pain.   amLODipine 10 MG tablet Commonly known as: NORVASC Take 1 tablet (10 mg total) by mouth daily.   atenolol 100 MG tablet Commonly known as: TENORMIN Take 1  tablet (100 mg total) by mouth daily.   atorvastatin 40 MG tablet Commonly known as: LIPITOR Take 1 tablet (40 mg total) by mouth daily. Start taking on: June 26, 2023 What changed:  medication strength how much to take   b complex vitamins tablet Take 1 tablet by mouth daily.   clopidogrel 75 MG tablet Commonly known as: PLAVIX Take 1 tablet (75 mg total) by mouth daily. Start taking on: June 26, 2023   cyanocobalamin 1000 MCG tablet Commonly known as: VITAMIN B12 Take 1,000 mcg by mouth daily.   dextromethorphan-guaiFENesin 30-600 MG 12hr tablet Commonly known as: MUCINEX DM Take 1 tablet by mouth 2 (two) times daily.   dorzolamide-timolol 2-0.5 % ophthalmic solution Commonly known as: COSOPT Place 1 drop into both eyes 2 (two) times daily.   fexofenadine 180 MG tablet Commonly known as: ALLEGRA Take 180 mg by mouth daily.   hydroxychloroquine 200 MG tablet Commonly known as: PLAQUENIL Take 200 mg by mouth 2 (two) times daily.   latanoprost 0.005 % ophthalmic solution Commonly known as: XALATAN Place 1 drop into both eyes at bedtime.   promethazine 12.5 MG tablet Commonly known as: PHENERGAN TAKE 1 TABLET BY MOUTH EVERY 6 HOURS AS NEEDED FOR NAUSEA OR VOMITING   Rhopressa 0.02 % Soln Generic drug: Netarsudil Dimesylate Place 1 drop into the right eye at bedtime.   traMADol 50 MG tablet Commonly known as: ULTRAM TAKE 1 TABLET EVERY 12 HOURS AS NEEDED FOR MODERATE PAIN What changed:  how much to take how to take this when to take this   Vitamin D 50 MCG (2000 UT) tablet Take 2,000 Units by mouth daily.        Allergies  Allergen Reactions   Ibuprofen Anaphylaxis   Percocet [Oxycodone-Acetaminophen]     nausea   Simvastatin Other (See Comments)    muscle aches   2,4-D Dimethylamine Other (See Comments)   Diltiazem Hcl     REACTION: ANGIO EDEMA   Hydrochlorothiazide     REACTION: SWELLING   Lisinopril     REACTION: SWELLING    Valacyclovir Other (See Comments)   Verapamil     REACTION: SWELLING    Consultations: Neurology   Procedures/Studies: VAS US CAROTID  Result Date: 06/24/2023 Carotid Arterial Duplex Study Patient Name:  Briana [name]  Date of Exam:   06/24/2023 Medical Rec #: [id number]        Accession #:    [id number] Date of Birth: 07/16/1931       Patient Gender: F Patient Age:   19 years Exam Location:  [facility] Procedure:      VAS US CAROTID Referring Phys: Gevena Mart --------------------------------------------------------------------------------  Indications:       CVA. Risk Factors:      Hypertension, hyperlipidemia, Diabetes. Comparison Study:  No prior studies. Performing Technologist: Chanda Busing RVT  Examination Guidelines: A complete evaluation includes B-mode imaging, spectral Doppler, color Doppler, and power Doppler as needed of all accessible portions of each vessel. Bilateral testing is considered an integral part of a complete examination. Limited  PSV cm/sEDV cm/sDescribeArm Pressure (mmHG)  +----------+--------+--------+--------+-------------------+ [id number]                                         +----------+--------+--------+--------+-------------------+ +---------+--------+--+--------+-+---------+ VertebralPSV cm/s42EDV cm/s8Antegrade +---------+--------+--+--------+-+---------+   Summary: Right Carotid: Velocities in the right ICA are consistent with a 1-39% stenosis. Left Carotid: Velocities in the left ICA are consistent with a 1-39% stenosis. Vertebrals: Bilateral vertebral arteries demonstrate antegrade flow. *See table(s) above for measurements and observations.  Electronically signed by Heath Lark on 06/24/2023 at 10:49:01 PM.    Final    ECHOCARDIOGRAM COMPLETE  Result Date: 06/24/2023    ECHOCARDIOGRAM REPORT   Patient Name:   Briana [name] Date of Exam: 06/24/2023 Medical Rec #:  [id number]       Height:       62.0 in Accession #:    [id number]      Weight:       157.4 lb Date of Birth:  1931/04/15      BSA:          1.727 m Patient Age:    89 years        BP:           139/60 mmHg Patient Gender: F               HR:           72 bpm. Exam Location:  Inpatient Procedure: 2D Echo, Cardiac Doppler and Color Doppler Indications:    CHF  History:        Patient has no prior history of Echocardiogram examinations.                 Risk Factors:Dyslipidemia, Hypertension and Diabetes.  Sonographer:    Meagan Baucom RDCS, FE, PE Referring Phys: [id number] ANASTASSIA DOUTOVA IMPRESSIONS  1. Left ventricular ejection fraction, by estimation, is 60 to 65%. The left ventricle has normal function. The left ventricle has no regional wall motion abnormalities. There is mild concentric left ventricular hypertrophy with moderate hypertrophy of the basilar septum. Left ventricular diastolic parameters are consistent with Grade II diastolic dysfunction (pseudonormalization).  2. Right ventricular systolic function is normal. The right ventricular size is normal.  3. Left atrial size was  severely dilated.  4. The mitral valve is normal in structure. No evidence of mitral valve regurgitation. No evidence of mitral stenosis. Moderate mitral annular calcification.  5. The aortic valve is tricuspid. There is mild calcification of the aortic valve. Aortic valve regurgitation is mild. Aortic valve sclerosis/calcification is present, without any evidence of aortic stenosis. Aortic regurgitation PHT measures 662 msec.  6. The inferior vena cava is normal in size with greater than 50% respiratory variability, suggesting right atrial pressure of 3 mmHg. FINDINGS  Left Ventricle: Left ventricular ejection fraction, by estimation, is 60 to 65%. The left ventricle has normal function. The left ventricle has no regional wall motion abnormalities. The left ventricular internal cavity size was normal in size. There is  mild concentric left ventricular hypertrophy. Left ventricular diastolic parameters are consistent with Grade II diastolic dysfunction (pseudonormalization). Right Ventricle: The right ventricular size is normal. No increase in right ventricular wall thickness. Right ventricular systolic function is normal. Left Atrium: Left atrial size was severely dilated. Right Atrium: Right atrial size was normal in size. Pericardium: There is no evidence of pericardial effusion. Mitral Valve: The mitral valve is  Physician Discharge Summary  Shauntrice Grabe Kerper WUJ:[id number] DOB: 1931-03-01 DOA: 06/23/2023  PCP: Etta Grandchild, MD  Admit date: 06/23/2023 Discharge date: 06/25/2023  Admitted From: Home Disposition: Home with home health PT OT  Recommendations for Outpatient Follow-up:  Follow up with PCP in 1-2 weeks Send referral to neurology for follow-up Cardiology clinic to send monitor to your home  Home Health: PT/OT Equipment/Devices: Already present at home  Discharge Condition: Fair CODE STATUS: Full code Diet recommendation: Low-salt and low-carb diet  Discharge summary: 89 year old with history of glaucoma, hypertension, hyperlipidemia woke up 1 day prior to admit and she was normal, later on after eating breakfast she felt dizzy with blurry vision.  She was recently diagnosed with COVID and since then she was having brain fog.  She was also noted to be feeling more off balance recently.  In the emergency room hemodynamically stable.  CT scan of the head with left cerebellar and right parietal lobe infarct.  MRI notable for subacute right parietal lobe infarct.  Admitted with acute/subacute stroke.   # Acute/subacute right MCA stroke: Clinical findings, blurry vision and foggy brain. CT head findings, subacute looking right parietal stroke MRI of the brain, subacute right parietal white matter stroke Carotid Doppler, 1 to 39% stenosis bilateral. CT angiogram of the neck with no emergent large vessel occlusion.  65% stenosis of the proximal right ICA. 2D echocardiogram, normal ejection fraction.  Severe left atrial dilatation. Antiplatelet therapy, none at home.  Allergy to aspirin.   Continue Plavix 75 mg daily uninterrupted. LDL 66.  At goal.  On atorvastatin 10 mg at home, neurology recommended to change to 40 mg daily. Hemoglobin A1c, 6.  Diet modification advised.  No treatment needed. Therapy recommendations, home health PT OT. Neurology clinic follow-up. Given significant left  atrial enlargement, suspect A-fib.  Patient without evidence of arrhythmias on telemetry monitor.  Cardiology clinic updated.  They will send Holter monitor at home.  Blood pressure is well-controlled, resume home medications. Rheumatoid arthritis: On Plaquenil.  Continue.  Stable discharge.  To go home with family.  Outpatient follow-up.  Discharge Diagnoses:  Principal Problem:   Acute ischemic stroke [facility]) Active Problems:   Hyperlipidemia with target LDL less than 130   Essential hypertension, benign   RA (rheumatoid arthritis) (HCC)   Vertigo, benign paroxysmal, bilateral   Type II diabetes mellitus with manifestations (HCC)   Stage 3b chronic kidney disease (HCC)   Stroke (cerebrum) [facility])    Discharge Instructions  Discharge Instructions     Ambulatory referral to Neurology   Complete by: As directed    An appointment is requested in approximately: 4 weeks   Diet - low sodium heart healthy   Complete by: As directed    Diet Carb Modified   Complete by: As directed    Increase activity slowly   Complete by: As directed       Allergies as of 06/25/2023       Reactions   Ibuprofen Anaphylaxis   Percocet [oxycodone-acetaminophen]    nausea   Simvastatin Other (See Comments)   muscle aches   2,4-d Dimethylamine Other (See Comments)   Diltiazem Hcl    REACTION: ANGIO EDEMA   Hydrochlorothiazide    REACTION: SWELLING   Lisinopril    REACTION: SWELLING   Valacyclovir Other (See Comments)   Verapamil    REACTION: SWELLING        Medication List     STOP taking these medications    hydrOXYzine

## 2023-06-25 NOTE — Consult Note (Signed)
[facility] Consult   06/25/2023  Briana [name] September 07, 1931 [id number]  Rounding on unit  [facility] [[facility]]  Accountable Care Organization [ACO] Patient: [name] Medicare   Primary Care Provider:  Etta Grandchild, MD with [place] [facility] is listed to provide the transition of care follow up   Patient with low risk score for unplanned readmission risk with 1IP in 6 months.  The patient was assessed for potential [facility]) Care Management service needs for post hospital transition for care coordination. Review of patient's electronic medical record reveals patient is for returning home with [facility] and with good family support per inpatient TOC notes.    Plan:   Referral request for community care coordination: anticipate [facility] Transitions of Care Team follow up. No other needs assessed.   [facility] Care Management/Population Health does not replace or interfere with any arrangements made by the Inpatient Transition of Care team.   For questions contact:   Charlesetta Shanks, RN, BSN, CCM [place]  [facility], [facility] Liaison Direct Dial: [phone] or secure chat Website: [email or web address]

## 2023-06-27 DIAGNOSIS — N1832 Chronic kidney disease, stage 3b: Secondary | ICD-10-CM | POA: Diagnosis not present

## 2023-06-27 DIAGNOSIS — H53462 Homonymous bilateral field defects, left side: Secondary | ICD-10-CM | POA: Diagnosis not present

## 2023-06-27 DIAGNOSIS — I69354 Hemiplegia and hemiparesis following cerebral infarction affecting left non-dominant side: Secondary | ICD-10-CM | POA: Diagnosis not present

## 2023-06-27 DIAGNOSIS — E1122 Type 2 diabetes mellitus with diabetic chronic kidney disease: Secondary | ICD-10-CM | POA: Diagnosis not present

## 2023-06-27 DIAGNOSIS — R531 Weakness: Secondary | ICD-10-CM | POA: Diagnosis not present

## 2023-06-27 DIAGNOSIS — M1389 Other specified arthritis, multiple sites: Secondary | ICD-10-CM | POA: Diagnosis not present

## 2023-06-27 DIAGNOSIS — I69398 Other sequelae of cerebral infarction: Secondary | ICD-10-CM | POA: Diagnosis not present

## 2023-06-27 DIAGNOSIS — I129 Hypertensive chronic kidney disease with stage 1 through stage 4 chronic kidney disease, or unspecified chronic kidney disease: Secondary | ICD-10-CM | POA: Diagnosis not present

## 2023-06-27 DIAGNOSIS — M069 Rheumatoid arthritis, unspecified: Secondary | ICD-10-CM | POA: Diagnosis not present

## 2023-06-29 ENCOUNTER — Telehealth: Payer: Self-pay | Admitting: *Deleted

## 2023-06-29 DIAGNOSIS — I129 Hypertensive chronic kidney disease with stage 1 through stage 4 chronic kidney disease, or unspecified chronic kidney disease: Secondary | ICD-10-CM | POA: Diagnosis not present

## 2023-06-29 DIAGNOSIS — M069 Rheumatoid arthritis, unspecified: Secondary | ICD-10-CM | POA: Diagnosis not present

## 2023-06-29 DIAGNOSIS — M1389 Other specified arthritis, multiple sites: Secondary | ICD-10-CM | POA: Diagnosis not present

## 2023-06-29 DIAGNOSIS — R531 Weakness: Secondary | ICD-10-CM | POA: Diagnosis not present

## 2023-06-29 DIAGNOSIS — E1122 Type 2 diabetes mellitus with diabetic chronic kidney disease: Secondary | ICD-10-CM | POA: Diagnosis not present

## 2023-06-29 DIAGNOSIS — I69354 Hemiplegia and hemiparesis following cerebral infarction affecting left non-dominant side: Secondary | ICD-10-CM | POA: Diagnosis not present

## 2023-06-29 DIAGNOSIS — N1832 Chronic kidney disease, stage 3b: Secondary | ICD-10-CM | POA: Diagnosis not present

## 2023-06-29 DIAGNOSIS — I69398 Other sequelae of cerebral infarction: Secondary | ICD-10-CM | POA: Diagnosis not present

## 2023-06-29 DIAGNOSIS — H53462 Homonymous bilateral field defects, left side: Secondary | ICD-10-CM | POA: Diagnosis not present

## 2023-06-29 NOTE — Transitions of Care (Post Inpatient/ED Visit) (Signed)
06/29/2023  Name: Briana [name] MRN: [id number] DOB: Mar 12, 1931  Today's TOC FU Call Status: Today's TOC FU Call Status:: Successful TOC FU Call Completed TOC FU Call Complete Date: 06/29/23 Patient's Name and Date of Birth confirmed.  Transition Care Management Follow-up Telephone Call Date of Discharge: 06/25/23 Discharge Facility: [facility]) Type of Discharge: Inpatient Admission Primary Inpatient Discharge Diagnosis:: Acute ischemic CVA How have you been since you were released from the hospital?: Better ("I am doing okay, better than I was.  The PT people just left my house- they said I was doing fine.  Please call my daughter about the appointment and my medicines"  Called daughter Briana [name] per patient request for [facility] completion) Any questions or concerns?: No  Items Reviewed: Did you receive and understand the discharge instructions provided?: Yes (thoroughly reviewed with patient/ daughter who verbalizes good understanding of same) Medications obtained,verified, and reconciled?: Yes (Medications Reviewed) (Full medication reconciliation/ review completed; no concerns or discrepancies identified; confirmed patient obtained/ is taking all newly Rx'd medications as instructed; daughter-manages medications/ denies questions/ concerns around medications today) Any new allergies since your discharge?: No Dietary orders reviewed?: Yes Type of Diet Ordered:: Regular Do you have support at home?: Yes People in Home: child(ren), adult Name of Support/Comfort Primary Source: Daughter reports patient resides with son "Briana [name];" she is essentially independent in self-care activities; supportive local adult children assists as/ if needed/ indicated: Briana [name] is primary caregiver  Medications Reviewed Today: Medications Reviewed Today     Reviewed by Michaela Corner, RN (Registered Nurse) on 06/29/23 at 1131  Med List Status: <None>   Medication Order Taking? Sig Documenting Provider  Last Dose Status Informant  acetaminophen (TYLENOL) 650 MG CR tablet [id number] Yes Take 650 mg by mouth every 8 (eight) hours as needed for pain. [provider] Taking Active Multiple Informants  amLODipine (NORVASC) 10 MG tablet [id number] Yes Take 1 tablet (10 mg total) by mouth daily. Etta Grandchild, MD Taking Active Multiple Informants  atenolol (TENORMIN) 100 MG tablet [id number] Yes Take 1 tablet (100 mg total) by mouth daily. Etta Grandchild, MD Taking Active Multiple Informants  atorvastatin (LIPITOR) 40 MG tablet [id number] Yes Take 1 tablet (40 mg total) by mouth daily. Dorcas Carrow, MD Taking Active   b complex vitamins tablet [id number] Yes Take 1 tablet by mouth daily. [provider] Taking Active Multiple Informants  Cholecalciferol (VITAMIN D) 2000 UNITS tablet [id number] Yes Take 2,000 Units by mouth daily. [provider] Taking Active Multiple Informants           Med Note Michaela Corner   Tue Jun 29, 2023 11:14 AM) 06/29/23: Reports during TOC call, she is taking (2) pills every day-- for a total of 4,000 U QD  clopidogrel (PLAVIX) 75 MG tablet [id number] Yes Take 1 tablet (75 mg total) by mouth daily. Dorcas Carrow, MD Taking Active   cyanocobalamin (VITAMIN B12) 1000 MCG tablet [id number] Yes Take 1,000 mcg by mouth daily. [provider] Taking Active Multiple Informants  dextromethorphan-guaiFENesin (MUCINEX DM) 30-600 MG 12hr tablet [id number] Yes Take 1 tablet by mouth 2 (two) times daily. [provider] Taking Active Multiple Informants           Med Note Michaela Corner   Tue Jun 29, 2023 11:12 AM) 06/29/23: Reports during TOC call taking PRN  dorzolamide-timolol (COSOPT) 22.3-6.8 MG/ML ophthalmic solution [id number] Yes Place 1 drop into both eyes 2 (two) times daily. [provider] Taking Active  Multiple Informants  fexofenadine (ALLEGRA) 180 MG tablet [id number] Yes Take 180 mg by mouth daily. [provider]  Taking Active Multiple Informants  hydroxychloroquine (PLAQUENIL) 200 MG tablet [id number] Yes Take 200 mg by mouth 2 (two) times daily. Zenovia Jordan, MD Taking Active Multiple Informants  latanoprost (XALATAN) 0.005 % ophthalmic solution [id number] Yes Place 1 drop into both eyes at bedtime. [provider] Taking Active Multiple Informants  Netarsudil Dimesylate (RHOPRESSA) 0.02 % SOLN [id number] Yes Place 1 drop into the right eye at bedtime. [provider] Taking Active Multiple Informants  promethazine (PHENERGAN) 12.5 MG tablet [id number] Yes TAKE 1 TABLET BY MOUTH EVERY 6 HOURS AS NEEDED FOR NAUSEA OR VOMITING Etta Grandchild, MD Taking Active Multiple Informants  traMADol (ULTRAM) 50 MG tablet [id number] Yes TAKE 1 TABLET EVERY 12 HOURS AS NEEDED FOR MODERATE PAIN  Patient taking differently: Take 50 mg by mouth See admin instructions. TAKE 1 TABLET EVERY 12 HOURS AS NEEDED FOR MODERATE PAIN   Etta Grandchild, MD Taking Active Multiple Informants           Home Care and Equipment/Supplies: Were Home Health Services Ordered?: Yes Name of Home Health Agency:: [facility]- PT/ OT Has Agency set up a time to come to your home?: Yes First Home Health Visit Date: 06/29/23 Any new equipment or medical supplies ordered?: No  Functional Questionnaire: Do you need assistance with bathing/showering or dressing?: Yes (daughter/ family supervises/ assists as indicated) Do you need assistance with meal preparation?: Yes (daughter/ family supervises/ assists as indicated) Do you need assistance with eating?: No Do you have difficulty maintaining continence: No Do you need assistance with getting out of bed/getting out of a chair/moving?: Yes (daughter/ family supervises/ assists as indicated- uses walker regularly) Do you have difficulty managing or taking your medications?: Yes (daughter manages all aspects of medication administration)  Follow up appointments reviewed: PCP  Follow-up appointment confirmed?: Yes (care coordination outreach in real-time with scheduling care guide to successfully schedule hospital follow up PCP appointment 07/05/23) Date of PCP follow-up appointment?: 07/05/23 Follow-up Provider: PCP- Dr. Yetta Barre Specialist [facility] Follow-up appointment confirmed?: No Reason Specialist Follow-Up Not Confirmed: Patient has Specialist Provider Number and will Call for Appointment Do you need transportation to your follow-up appointment?: No Do you understand care options if your condition(s) worsen?: Yes-patient verbalized understanding  SDOH Interventions Today    Flowsheet Row Most Recent Value  SDOH Interventions   Food Insecurity Interventions Intervention Not Indicated  Transportation Interventions Intervention Not Indicated  [family provides all transportation for patient]      TOC Interventions Today    Flowsheet Row Most Recent Value  TOC Interventions   TOC Interventions Discussed/Reviewed TOC Interventions Discussed, Arranged PCP follow up less than 12 days/Care Guide scheduled  [provided my direct contact information should questions/ concerns/ needs arise post-TOC call, prior to RN CM telephone visit on 07/16/23]      Interventions Today    Flowsheet Row Most Recent Value  Chronic Disease   Chronic disease during today's visit Other  [Acute ischemic CVA]  General Interventions   General Interventions Discussed/Reviewed General Interventions Discussed, Durable Medical Equipment (DME), Referral to Nurse, Doctor Visits, Communication with  [scheduled with RN CM Care Coordinator for follow up telephone visit on 07/16/23]  Doctor Visits Discussed/Reviewed Doctor Visits Discussed, PCP, Specialist  Durable Medical Equipment (DME) Dan Humphreys  PCP/Specialist Visits Compliance with follow-up visit  Communication with RN  Exercise Interventions   Exercise Discussed/Reviewed Exercise Discussed  [Confirmed home health  PT active: encouraged  patient's ongoing engagement/ participation]  Education Interventions   Education Provided Provided Education  Provided Verbal Education On Other, Medication  [purpose of/ importance of having updated DPR completed,  process to get prescriptions transferred to mail-order pharmacy,  basics of starting Advanced Directives/ POA process]  Nutrition Interventions   Nutrition Discussed/Reviewed Nutrition Discussed  Pharmacy Interventions   Pharmacy Dicussed/Reviewed Pharmacy Topics Discussed  [Full medication review with updating medication list in EHR per patient report]  Safety Interventions   Safety Discussed/Reviewed Safety Discussed, Fall Risk      Caryl Pina, RN, BSN, CCRN Alumnus RN CM Care Coordination/ Transition of Care- [facility] Care Management [phone]: direct office

## 2023-06-30 ENCOUNTER — Telehealth: Payer: Self-pay | Admitting: Internal Medicine

## 2023-06-30 NOTE — Telephone Encounter (Signed)
We have received FMLA forms for the pt and they have been placed in the providers basket.   Please fax to: [phone]

## 2023-07-01 ENCOUNTER — Telehealth: Payer: Self-pay | Admitting: Internal Medicine

## 2023-07-01 NOTE — Telephone Encounter (Signed)
We have received FMLA forms to be filled out so that the pt's daughter, Aundra Dubin, can take care of her. Forms have been placed in the providers box.   Please fax to: [phone]

## 2023-07-02 NOTE — Telephone Encounter (Signed)
LVM for Briana [name] to discuss forms.   Need clarification what what Dx forms are needed for and does she need continuous, intermittent leave, or both?   If continuous what was the first day she was out of work and when does she plan to go back.

## 2023-07-04 DIAGNOSIS — I700 Atherosclerosis of aorta: Secondary | ICD-10-CM

## 2023-07-04 DIAGNOSIS — I69354 Hemiplegia and hemiparesis following cerebral infarction affecting left non-dominant side: Secondary | ICD-10-CM | POA: Diagnosis not present

## 2023-07-04 DIAGNOSIS — N1832 Chronic kidney disease, stage 3b: Secondary | ICD-10-CM | POA: Diagnosis not present

## 2023-07-04 DIAGNOSIS — H8113 Benign paroxysmal vertigo, bilateral: Secondary | ICD-10-CM

## 2023-07-04 DIAGNOSIS — M069 Rheumatoid arthritis, unspecified: Secondary | ICD-10-CM | POA: Diagnosis not present

## 2023-07-04 DIAGNOSIS — I129 Hypertensive chronic kidney disease with stage 1 through stage 4 chronic kidney disease, or unspecified chronic kidney disease: Secondary | ICD-10-CM | POA: Diagnosis not present

## 2023-07-04 DIAGNOSIS — I69398 Other sequelae of cerebral infarction: Secondary | ICD-10-CM | POA: Diagnosis not present

## 2023-07-04 DIAGNOSIS — H53462 Homonymous bilateral field defects, left side: Secondary | ICD-10-CM | POA: Diagnosis not present

## 2023-07-04 DIAGNOSIS — R531 Weakness: Secondary | ICD-10-CM | POA: Diagnosis not present

## 2023-07-04 DIAGNOSIS — M5416 Radiculopathy, lumbar region: Secondary | ICD-10-CM

## 2023-07-04 DIAGNOSIS — E1122 Type 2 diabetes mellitus with diabetic chronic kidney disease: Secondary | ICD-10-CM | POA: Diagnosis not present

## 2023-07-04 DIAGNOSIS — M1389 Other specified arthritis, multiple sites: Secondary | ICD-10-CM | POA: Diagnosis not present

## 2023-07-05 ENCOUNTER — Encounter: Payer: Self-pay | Admitting: Internal Medicine

## 2023-07-05 ENCOUNTER — Ambulatory Visit: Payer: Medicare HMO | Admitting: Internal Medicine

## 2023-07-05 VITALS — BP 142/62 | HR 64 | Temp 98.8°F | Resp 16 | Ht 62.0 in

## 2023-07-05 DIAGNOSIS — I10 Essential (primary) hypertension: Secondary | ICD-10-CM

## 2023-07-05 DIAGNOSIS — E118 Type 2 diabetes mellitus with unspecified complications: Secondary | ICD-10-CM

## 2023-07-05 DIAGNOSIS — Z8673 Personal history of transient ischemic attack (TIA), and cerebral infarction without residual deficits: Secondary | ICD-10-CM

## 2023-07-05 DIAGNOSIS — E785 Hyperlipidemia, unspecified: Secondary | ICD-10-CM | POA: Diagnosis not present

## 2023-07-05 DIAGNOSIS — N1832 Chronic kidney disease, stage 3b: Secondary | ICD-10-CM | POA: Diagnosis not present

## 2023-07-05 DIAGNOSIS — I6300 Cerebral infarction due to thrombosis of unspecified precerebral artery: Secondary | ICD-10-CM

## 2023-07-05 NOTE — Patient Instructions (Signed)
Ischemic Stroke  An ischemic stroke (cerebrovascular accident, CVA) occurs when an area of the brain does not get enough blood flow. This leads to the sudden death of brain tissue and can cause brain damage. An ischemic stroke is a medical emergency. It must be treated right away. What are the causes? This condition is caused by a decrease of blood flow to a part of the brain. This may be due to: A small blood clot (embolus). A buildup of plaque in the blood vessels (atherosclerosis). This blocks blood flow in the brain. An abnormal heart rhythm, called atrial fibrillation (AFib). This sends a small blood clot to the brain. A blocked or damaged artery in the head or neck. Certain infections. Inflammation of the arteries in the brain (vasculitis). Sometimes, the cause of ischemic stroke is not known. What increases the risk? The following medical conditions may increase your risk of a stroke: High blood pressure (hypertension). Heart disease. Diabetes. High cholesterol. Obesity. Sleep problems (sleep apnea). Other risk factors that you can change include: Using products that contain nicotine or tobacco. Not being active. Heavy use of alcohol or drugs, especially cocaine and methamphetamine. Taking birth control pills, especially if you also use tobacco. Risk factors that you cannot change include: Being older than age 43. Having a history of blood clots, stroke, or mini-stroke (transient ischemic attack, TIA). Having a family history of stroke. What are the signs or symptoms? Symptoms of this condition usually develop suddenly, or you may notice them after waking from sleep. These may include: Weakness or numbness of your face, arm, or leg, especially on one side of your body. Loss of balance or coordination. Slurred speech, trouble speaking, trouble understanding speech, or a combination of these. Vision changes. You may have double vision, blurred vision, or loss of  vision. Dizziness or confusion. Nausea and vomiting. Severe headache. If possible, write down the exact time your symptoms started. Tell your health care provider. If symptoms come and go, they could be signs of a TIA. Get help right away, even if you feel better. How is this diagnosed? This condition may be diagnosed based on: Your symptoms, your medical history, and a physical exam. CT scan of the brain. MRI. Imaging tests that scan blood flow in the brain (CT angiogram, MRI angiogram, or cerebral angiogram). You may also have other tests, including: Electrocardiogram (ECG). Continuous heart monitoring. Transthoracic echocardiogram (TTE). Transesophageal echocardiogram (TEE). Carotid ultrasound. Blood tests. Sleep study to check for sleep apnea. You may need to see a health care provider who specializes in stroke care. How is this treated? Treatment for this condition depends on the area of the brain affected and the cause of your symptoms. Some treatments work better if they are done within 3-6 hours of the start of symptoms. These may include: Medicine that is injected to dissolve the blood clot. Treatments given directly to the affected artery to remove or dissolve the blood clot. Medicines to control blood pressure. Medicines to thin the blood (anticoagulant or antiplatelet). Other treatments may include: Oxygen. IV fluids. Procedures to increase blood flow. After a stroke, you may work with physical, speech, mental health, or occupational therapists to help you recover. Follow these instructions at home: Medicines Take over-the-counter and prescription medicines only as told by your health care provider. If you were told to take a medicine to thin your blood, take it exactly as told, at the same time every day. Taking too much of a blood thinner can cause bleeding. Taking  too little may not protect you against a stroke and other problems. If you were not prescribed  medicines that contain aspirin or NSAIDs, such as ibuprofen, talk with your health care provider before you take any of these. These medicines increase your risk for dangerous bleeding. When taking a blood thinner, do these things: Hold pressure over any cuts that bleed for longer than usual. Tell your dentist and other health care providers that you are taking anticoagulants before having any procedures that may cause bleeding. Avoid activities that could cause injury or bruising. Wear a medical alert bracelet or carry a card that lists what medicines you take. Eating and drinking Follow instructions from your health care provider about diet. Eat healthy foods. If your stroke affected your ability to swallow, you may need to take steps to avoid choking. These may include: Taking small bites of food. Eating soft or pureed foods. Safety Follow instructions from your health care team about physical activity. Use a walker or cane as told by your health care provider. Take steps to lower your risk of falls at home. These may include: Installing grab bars in the bedroom and bathroom. Using raised toilets and putting a seat in the shower. Removing clutter and tripping hazards, such as cords or area rugs. General instructions Do not use any products that contain nicotine or tobacco. These include cigarettes, chewing tobacco, and vaping devices, such as e-cigarettes. If you need help quitting, ask your health care provider. If you drink alcohol: Limit how much you have to: 0-1 drink a day for women who are not pregnant. 0-2 drinks a day for men. Know how much alcohol is in your drink. In the [place] one drink equals one 12 oz bottle of beer (355 mL), one 5 oz glass of wine (148 mL), or one 1 oz glass of hard liquor (44 mL). Keep all follow-up visits. This is important. How is this prevented? You can lower your risk of another stroke by managing these conditions: High blood pressure. High  cholesterol. Diabetes. Heart disease. Sleep apnea. Obesity. Quitting smoking, limiting alcohol, and staying physically active also will reduce your risk. Your health care provider will continue to help you with ways to prevent short-term and long-term problems caused by stroke. Get help right away if: You have any symptoms of a stroke. "BE FAST" is an easy way to remember the main warning signs of a stroke: B - Balance. Signs are dizziness, sudden trouble walking, or loss of balance. E - Eyes. Signs are trouble seeing or a sudden change in vision. F - Face. Signs are sudden weakness or numbness of the face, or the face or eyelid drooping on one side. A - Arms. Signs are weakness or numbness in an arm. This happens suddenly and usually on one side of the body. S - Speech. Signs are sudden trouble speaking, slurred speech, or trouble understanding what people say. T - Time. Time to call emergency services. Write down what time symptoms started. You have other signs of a stroke, such as: A sudden, severe headache with no known cause. Nausea or vomiting. Seizure. These symptoms may represent a serious problem that is an emergency. Do not wait to see if the symptoms will go away. Get medical help right away. Call your local emergency services (911 in the [place] Do not drive yourself to the hospital. Summary An ischemic stroke (cerebrovascular accident, CVA) occurs when an area of the brain does not get enough blood flow. Symptoms of this  condition usually develop suddenly, or you may notice them after waking from sleep. It is very important to get treatment at the first sign of stroke symptoms. Stroke is a medical emergency that must be treated right away. This information is not intended to replace advice given to you by your health care provider. Make sure you discuss any questions you have with your health care provider. Document Revised: 05/08/2020 Document Reviewed: 05/08/2020 [employer]  Patient Education  2022 [employer].

## 2023-07-05 NOTE — Progress Notes (Unsigned)
Subjective:  Patient ID: Briana [name], female    DOB: Feb 20, 1931  Age: 89 y.o. MRN: [id number]  CC: Hypertension and Cerebrovascular Accident   HPI Briana [name] presents for f/up ----  Discussed the use of AI scribe software for clinical note transcription with the patient, who gave verbal consent to proceed.  History of Present Illness   The patient, with a history of stroke and high blood pressure, presents with ongoing dizziness, nausea, and weakness in the left hand. The dizziness and nausea have been persistent, causing significant discomfort and affecting the patient's quality of life. The patient reports that these symptoms were present even before the stroke. The patient's blood pressure has been high, with a recent reading of 146/65. The patient is currently on Plavix to prevent another stroke and has been monitoring her blood pressure at home.  The patient also reports a history of prediabetes, with a recent HbA1c of 6.3. The patient has been managing her blood sugar levels at home and reports that her levels have been good recently.  The patient is currently receiving physical and occupational therapy. Despite the therapy, the patient reports persistent weakness in the left hand and difficulty with balance. The patient uses a walker for mobility and requires assistance for certain activities such as bathing.       Briana [name] [id number] DOB: Oct 30, 1930 DOA: 06/23/2023   PCP: Etta Grandchild, MD   Admit date: 06/23/2023 Discharge date: 06/25/2023   Admitted From: Home Disposition: Home with home health PT OT   Recommendations for Outpatient Follow-up:  Follow up with PCP in 1-2 weeks Send referral to neurology for follow-up Cardiology clinic to send monitor to your home   Home Health: PT/OT Equipment/Devices: Already present at home   Discharge Condition: Fair CODE STATUS: Full code Diet recommendation: Low-salt and low-carb diet   Discharge  summary: 89 year old with history of glaucoma, hypertension, hyperlipidemia woke up 1 day prior to admit and she was normal, later on after eating breakfast she felt dizzy with blurry vision.  She was recently diagnosed with COVID and since then she was having brain fog.  She was also noted to be feeling more off balance recently.  In the emergency room hemodynamically stable.  CT scan of the head with left cerebellar and right parietal lobe infarct.  MRI notable for subacute right parietal lobe infarct.  Admitted with acute/subacute stroke.   # Acute/subacute right MCA stroke: Clinical findings, blurry vision and foggy brain. CT head findings, subacute looking right parietal stroke MRI of the brain, subacute right parietal white matter stroke Carotid Doppler, 1 to 39% stenosis bilateral. CT angiogram of the neck with no emergent large vessel occlusion.  65% stenosis of the proximal right ICA. 2D echocardiogram, normal ejection fraction.  Severe left atrial dilatation. Antiplatelet therapy, none at home.  Allergy to aspirin.   Continue Plavix 75 mg daily uninterrupted. LDL 66.  At goal.  On atorvastatin 10 mg at home, neurology recommended to change to 40 mg daily. Hemoglobin A1c, 6.  Diet modification advised.  No treatment needed. Therapy recommendations, home health PT OT. Neurology clinic follow-up. Given significant left atrial enlargement, suspect A-fib.  Patient without evidence of arrhythmias on telemetry monitor.  Cardiology clinic updated.  They will send Holter monitor at home.   Blood pressure is well-controlled, resume home medications. Rheumatoid arthritis: On Plaquenil.  Continue.   Stable discharge.  To go home with family.  Outpatient follow-up.   Discharge Diagnoses:  Subjective:  Patient ID: Briana [name], female    DOB: Feb 20, 1931  Age: 89 y.o. MRN: [id number]  CC: Hypertension and Cerebrovascular Accident   HPI Briana [name] presents for f/up ----  Discussed the use of AI scribe software for clinical note transcription with the patient, who gave verbal consent to proceed.  History of Present Illness   The patient, with a history of stroke and high blood pressure, presents with ongoing dizziness, nausea, and weakness in the left hand. The dizziness and nausea have been persistent, causing significant discomfort and affecting the patient's quality of life. The patient reports that these symptoms were present even before the stroke. The patient's blood pressure has been high, with a recent reading of 146/65. The patient is currently on Plavix to prevent another stroke and has been monitoring her blood pressure at home.  The patient also reports a history of prediabetes, with a recent HbA1c of 6.3. The patient has been managing her blood sugar levels at home and reports that her levels have been good recently.  The patient is currently receiving physical and occupational therapy. Despite the therapy, the patient reports persistent weakness in the left hand and difficulty with balance. The patient uses a walker for mobility and requires assistance for certain activities such as bathing.       Briana [name] [id number] DOB: Oct 30, 1930 DOA: 06/23/2023   PCP: Etta Grandchild, MD   Admit date: 06/23/2023 Discharge date: 06/25/2023   Admitted From: Home Disposition: Home with home health PT OT   Recommendations for Outpatient Follow-up:  Follow up with PCP in 1-2 weeks Send referral to neurology for follow-up Cardiology clinic to send monitor to your home   Home Health: PT/OT Equipment/Devices: Already present at home   Discharge Condition: Fair CODE STATUS: Full code Diet recommendation: Low-salt and low-carb diet   Discharge  summary: 89 year old with history of glaucoma, hypertension, hyperlipidemia woke up 1 day prior to admit and she was normal, later on after eating breakfast she felt dizzy with blurry vision.  She was recently diagnosed with COVID and since then she was having brain fog.  She was also noted to be feeling more off balance recently.  In the emergency room hemodynamically stable.  CT scan of the head with left cerebellar and right parietal lobe infarct.  MRI notable for subacute right parietal lobe infarct.  Admitted with acute/subacute stroke.   # Acute/subacute right MCA stroke: Clinical findings, blurry vision and foggy brain. CT head findings, subacute looking right parietal stroke MRI of the brain, subacute right parietal white matter stroke Carotid Doppler, 1 to 39% stenosis bilateral. CT angiogram of the neck with no emergent large vessel occlusion.  65% stenosis of the proximal right ICA. 2D echocardiogram, normal ejection fraction.  Severe left atrial dilatation. Antiplatelet therapy, none at home.  Allergy to aspirin.   Continue Plavix 75 mg daily uninterrupted. LDL 66.  At goal.  On atorvastatin 10 mg at home, neurology recommended to change to 40 mg daily. Hemoglobin A1c, 6.  Diet modification advised.  No treatment needed. Therapy recommendations, home health PT OT. Neurology clinic follow-up. Given significant left atrial enlargement, suspect A-fib.  Patient without evidence of arrhythmias on telemetry monitor.  Cardiology clinic updated.  They will send Holter monitor at home.   Blood pressure is well-controlled, resume home medications. Rheumatoid arthritis: On Plaquenil.  Continue.   Stable discharge.  To go home with family.  Outpatient follow-up.   Discharge Diagnoses:  06/25/23 (!) 144/66  05/19/23 132/74    Wt Readings from Last 3 Encounters:  06/23/23 157 lb 6.4 oz (71.4 kg)  05/19/23 156 lb (70.8 kg)  03/03/23 157 lb 8 oz (71.4 kg)    Physical Exam Vitals reviewed.  HENT:     Nose: Nose normal.     Mouth/Throat:     Mouth: Mucous membranes are moist.  Eyes:     General: No scleral icterus.    Conjunctiva/sclera: Conjunctivae normal.  Cardiovascular:     Rate and Rhythm: Normal rate and regular rhythm.     Heart sounds: No murmur heard.    No gallop.  Pulmonary:     Effort: Pulmonary effort is normal.     Breath sounds: No stridor. No wheezing, rhonchi or rales.  Abdominal:     General: Abdomen is flat.     Palpations: There is no mass.     Tenderness: There is no abdominal tenderness. There is no guarding.     Hernia: No hernia is  present.  Musculoskeletal:        General: Normal range of motion.     Cervical back: Neck supple.     Right lower leg: No edema.     Left lower leg: No edema.  Lymphadenopathy:     Cervical: No cervical adenopathy.  Skin:    General: Skin is warm and dry.  Neurological:     Mental Status: Mental status is at baseline.     Sensory: Sensory deficit present.     Motor: Weakness present.     Coordination: Coordination abnormal.     Gait: Gait abnormal.     Deep Tendon Reflexes: Reflexes abnormal.     Comments: In a wheelchair  Psychiatric:        Mood and Affect: Mood normal.        Behavior: Behavior normal.     Lab Results  Component Value Date   WBC 9.0 06/23/2023   HGB 12.8 06/23/2023   HCT 37.7 06/23/2023   PLT 183 06/23/2023   GLUCOSE 118 (H) 06/23/2023   CHOL 176 06/24/2023   TRIG 52 06/24/2023   HDL 100 06/24/2023   LDLDIRECT 104.5 04/25/2010   LDLCALC 66 06/24/2023   ALT 25 06/23/2023   AST 26 06/23/2023   NA 140 06/23/2023   K 3.6 06/23/2023   CL 104 06/23/2023   CREATININE 0.84 06/23/2023   BUN 7 (L) 06/23/2023   CO2 25 06/23/2023   TSH 1.440 06/23/2023   INR 0.92 06/05/2014   HGBA1C 6.3 (H) 06/23/2023   MICROALBUR 10.1 (H) 01/07/2023    VAS US CAROTID  Result Date: 06/24/2023 Carotid Arterial Duplex Study Patient Name:  Briana [name]  Date of Exam:   06/24/2023 Medical Rec #: [id number]        Accession #:    [id number] Date of Birth: 10-27-1930       Patient Gender: F Patient Age:   5 years Exam Location:  [facility] Procedure:      VAS US CAROTID Referring Phys: Gevena Mart --------------------------------------------------------------------------------  Indications:       CVA. Risk Factors:      Hypertension, hyperlipidemia, Diabetes. Comparison Study:  No prior studies. Performing Technologist: Chanda Busing RVT  Examination Guidelines: A complete evaluation includes B-mode imaging, spectral Doppler, color Doppler, and power Doppler as  needed of all accessible portions of each vessel. Bilateral testing is considered an integral part of a complete examination. Limited examinations for reoccurring  06/25/23 (!) 144/66  05/19/23 132/74    Wt Readings from Last 3 Encounters:  06/23/23 157 lb 6.4 oz (71.4 kg)  05/19/23 156 lb (70.8 kg)  03/03/23 157 lb 8 oz (71.4 kg)    Physical Exam Vitals reviewed.  HENT:     Nose: Nose normal.     Mouth/Throat:     Mouth: Mucous membranes are moist.  Eyes:     General: No scleral icterus.    Conjunctiva/sclera: Conjunctivae normal.  Cardiovascular:     Rate and Rhythm: Normal rate and regular rhythm.     Heart sounds: No murmur heard.    No gallop.  Pulmonary:     Effort: Pulmonary effort is normal.     Breath sounds: No stridor. No wheezing, rhonchi or rales.  Abdominal:     General: Abdomen is flat.     Palpations: There is no mass.     Tenderness: There is no abdominal tenderness. There is no guarding.     Hernia: No hernia is  present.  Musculoskeletal:        General: Normal range of motion.     Cervical back: Neck supple.     Right lower leg: No edema.     Left lower leg: No edema.  Lymphadenopathy:     Cervical: No cervical adenopathy.  Skin:    General: Skin is warm and dry.  Neurological:     Mental Status: Mental status is at baseline.     Sensory: Sensory deficit present.     Motor: Weakness present.     Coordination: Coordination abnormal.     Gait: Gait abnormal.     Deep Tendon Reflexes: Reflexes abnormal.     Comments: In a wheelchair  Psychiatric:        Mood and Affect: Mood normal.        Behavior: Behavior normal.     Lab Results  Component Value Date   WBC 9.0 06/23/2023   HGB 12.8 06/23/2023   HCT 37.7 06/23/2023   PLT 183 06/23/2023   GLUCOSE 118 (H) 06/23/2023   CHOL 176 06/24/2023   TRIG 52 06/24/2023   HDL 100 06/24/2023   LDLDIRECT 104.5 04/25/2010   LDLCALC 66 06/24/2023   ALT 25 06/23/2023   AST 26 06/23/2023   NA 140 06/23/2023   K 3.6 06/23/2023   CL 104 06/23/2023   CREATININE 0.84 06/23/2023   BUN 7 (L) 06/23/2023   CO2 25 06/23/2023   TSH 1.440 06/23/2023   INR 0.92 06/05/2014   HGBA1C 6.3 (H) 06/23/2023   MICROALBUR 10.1 (H) 01/07/2023    VAS US CAROTID  Result Date: 06/24/2023 Carotid Arterial Duplex Study Patient Name:  Briana [name]  Date of Exam:   06/24/2023 Medical Rec #: [id number]        Accession #:    [id number] Date of Birth: 10-27-1930       Patient Gender: F Patient Age:   5 years Exam Location:  [facility] Procedure:      VAS US CAROTID Referring Phys: Gevena Mart --------------------------------------------------------------------------------  Indications:       CVA. Risk Factors:      Hypertension, hyperlipidemia, Diabetes. Comparison Study:  No prior studies. Performing Technologist: Chanda Busing RVT  Examination Guidelines: A complete evaluation includes B-mode imaging, spectral Doppler, color Doppler, and power Doppler as  needed of all accessible portions of each vessel. Bilateral testing is considered an integral part of a complete examination. Limited examinations for reoccurring  06/25/23 (!) 144/66  05/19/23 132/74    Wt Readings from Last 3 Encounters:  06/23/23 157 lb 6.4 oz (71.4 kg)  05/19/23 156 lb (70.8 kg)  03/03/23 157 lb 8 oz (71.4 kg)    Physical Exam Vitals reviewed.  HENT:     Nose: Nose normal.     Mouth/Throat:     Mouth: Mucous membranes are moist.  Eyes:     General: No scleral icterus.    Conjunctiva/sclera: Conjunctivae normal.  Cardiovascular:     Rate and Rhythm: Normal rate and regular rhythm.     Heart sounds: No murmur heard.    No gallop.  Pulmonary:     Effort: Pulmonary effort is normal.     Breath sounds: No stridor. No wheezing, rhonchi or rales.  Abdominal:     General: Abdomen is flat.     Palpations: There is no mass.     Tenderness: There is no abdominal tenderness. There is no guarding.     Hernia: No hernia is  present.  Musculoskeletal:        General: Normal range of motion.     Cervical back: Neck supple.     Right lower leg: No edema.     Left lower leg: No edema.  Lymphadenopathy:     Cervical: No cervical adenopathy.  Skin:    General: Skin is warm and dry.  Neurological:     Mental Status: Mental status is at baseline.     Sensory: Sensory deficit present.     Motor: Weakness present.     Coordination: Coordination abnormal.     Gait: Gait abnormal.     Deep Tendon Reflexes: Reflexes abnormal.     Comments: In a wheelchair  Psychiatric:        Mood and Affect: Mood normal.        Behavior: Behavior normal.     Lab Results  Component Value Date   WBC 9.0 06/23/2023   HGB 12.8 06/23/2023   HCT 37.7 06/23/2023   PLT 183 06/23/2023   GLUCOSE 118 (H) 06/23/2023   CHOL 176 06/24/2023   TRIG 52 06/24/2023   HDL 100 06/24/2023   LDLDIRECT 104.5 04/25/2010   LDLCALC 66 06/24/2023   ALT 25 06/23/2023   AST 26 06/23/2023   NA 140 06/23/2023   K 3.6 06/23/2023   CL 104 06/23/2023   CREATININE 0.84 06/23/2023   BUN 7 (L) 06/23/2023   CO2 25 06/23/2023   TSH 1.440 06/23/2023   INR 0.92 06/05/2014   HGBA1C 6.3 (H) 06/23/2023   MICROALBUR 10.1 (H) 01/07/2023    VAS US CAROTID  Result Date: 06/24/2023 Carotid Arterial Duplex Study Patient Name:  Briana [name]  Date of Exam:   06/24/2023 Medical Rec #: [id number]        Accession #:    [id number] Date of Birth: 10-27-1930       Patient Gender: F Patient Age:   5 years Exam Location:  [facility] Procedure:      VAS US CAROTID Referring Phys: Gevena Mart --------------------------------------------------------------------------------  Indications:       CVA. Risk Factors:      Hypertension, hyperlipidemia, Diabetes. Comparison Study:  No prior studies. Performing Technologist: Chanda Busing RVT  Examination Guidelines: A complete evaluation includes B-mode imaging, spectral Doppler, color Doppler, and power Doppler as  needed of all accessible portions of each vessel. Bilateral testing is considered an integral part of a complete examination. Limited examinations for reoccurring  Subjective:  Patient ID: Briana [name], female    DOB: Feb 20, 1931  Age: 89 y.o. MRN: [id number]  CC: Hypertension and Cerebrovascular Accident   HPI Briana [name] presents for f/up ----  Discussed the use of AI scribe software for clinical note transcription with the patient, who gave verbal consent to proceed.  History of Present Illness   The patient, with a history of stroke and high blood pressure, presents with ongoing dizziness, nausea, and weakness in the left hand. The dizziness and nausea have been persistent, causing significant discomfort and affecting the patient's quality of life. The patient reports that these symptoms were present even before the stroke. The patient's blood pressure has been high, with a recent reading of 146/65. The patient is currently on Plavix to prevent another stroke and has been monitoring her blood pressure at home.  The patient also reports a history of prediabetes, with a recent HbA1c of 6.3. The patient has been managing her blood sugar levels at home and reports that her levels have been good recently.  The patient is currently receiving physical and occupational therapy. Despite the therapy, the patient reports persistent weakness in the left hand and difficulty with balance. The patient uses a walker for mobility and requires assistance for certain activities such as bathing.       Briana [name] [id number] DOB: Oct 30, 1930 DOA: 06/23/2023   PCP: Etta Grandchild, MD   Admit date: 06/23/2023 Discharge date: 06/25/2023   Admitted From: Home Disposition: Home with home health PT OT   Recommendations for Outpatient Follow-up:  Follow up with PCP in 1-2 weeks Send referral to neurology for follow-up Cardiology clinic to send monitor to your home   Home Health: PT/OT Equipment/Devices: Already present at home   Discharge Condition: Fair CODE STATUS: Full code Diet recommendation: Low-salt and low-carb diet   Discharge  summary: 89 year old with history of glaucoma, hypertension, hyperlipidemia woke up 1 day prior to admit and she was normal, later on after eating breakfast she felt dizzy with blurry vision.  She was recently diagnosed with COVID and since then she was having brain fog.  She was also noted to be feeling more off balance recently.  In the emergency room hemodynamically stable.  CT scan of the head with left cerebellar and right parietal lobe infarct.  MRI notable for subacute right parietal lobe infarct.  Admitted with acute/subacute stroke.   # Acute/subacute right MCA stroke: Clinical findings, blurry vision and foggy brain. CT head findings, subacute looking right parietal stroke MRI of the brain, subacute right parietal white matter stroke Carotid Doppler, 1 to 39% stenosis bilateral. CT angiogram of the neck with no emergent large vessel occlusion.  65% stenosis of the proximal right ICA. 2D echocardiogram, normal ejection fraction.  Severe left atrial dilatation. Antiplatelet therapy, none at home.  Allergy to aspirin.   Continue Plavix 75 mg daily uninterrupted. LDL 66.  At goal.  On atorvastatin 10 mg at home, neurology recommended to change to 40 mg daily. Hemoglobin A1c, 6.  Diet modification advised.  No treatment needed. Therapy recommendations, home health PT OT. Neurology clinic follow-up. Given significant left atrial enlargement, suspect A-fib.  Patient without evidence of arrhythmias on telemetry monitor.  Cardiology clinic updated.  They will send Holter monitor at home.   Blood pressure is well-controlled, resume home medications. Rheumatoid arthritis: On Plaquenil.  Continue.   Stable discharge.  To go home with family.  Outpatient follow-up.   Discharge Diagnoses:  Subjective:  Patient ID: Briana [name], female    DOB: Feb 20, 1931  Age: 89 y.o. MRN: [id number]  CC: Hypertension and Cerebrovascular Accident   HPI Briana [name] presents for f/up ----  Discussed the use of AI scribe software for clinical note transcription with the patient, who gave verbal consent to proceed.  History of Present Illness   The patient, with a history of stroke and high blood pressure, presents with ongoing dizziness, nausea, and weakness in the left hand. The dizziness and nausea have been persistent, causing significant discomfort and affecting the patient's quality of life. The patient reports that these symptoms were present even before the stroke. The patient's blood pressure has been high, with a recent reading of 146/65. The patient is currently on Plavix to prevent another stroke and has been monitoring her blood pressure at home.  The patient also reports a history of prediabetes, with a recent HbA1c of 6.3. The patient has been managing her blood sugar levels at home and reports that her levels have been good recently.  The patient is currently receiving physical and occupational therapy. Despite the therapy, the patient reports persistent weakness in the left hand and difficulty with balance. The patient uses a walker for mobility and requires assistance for certain activities such as bathing.       Briana [name] [id number] DOB: Oct 30, 1930 DOA: 06/23/2023   PCP: Etta Grandchild, MD   Admit date: 06/23/2023 Discharge date: 06/25/2023   Admitted From: Home Disposition: Home with home health PT OT   Recommendations for Outpatient Follow-up:  Follow up with PCP in 1-2 weeks Send referral to neurology for follow-up Cardiology clinic to send monitor to your home   Home Health: PT/OT Equipment/Devices: Already present at home   Discharge Condition: Fair CODE STATUS: Full code Diet recommendation: Low-salt and low-carb diet   Discharge  summary: 89 year old with history of glaucoma, hypertension, hyperlipidemia woke up 1 day prior to admit and she was normal, later on after eating breakfast she felt dizzy with blurry vision.  She was recently diagnosed with COVID and since then she was having brain fog.  She was also noted to be feeling more off balance recently.  In the emergency room hemodynamically stable.  CT scan of the head with left cerebellar and right parietal lobe infarct.  MRI notable for subacute right parietal lobe infarct.  Admitted with acute/subacute stroke.   # Acute/subacute right MCA stroke: Clinical findings, blurry vision and foggy brain. CT head findings, subacute looking right parietal stroke MRI of the brain, subacute right parietal white matter stroke Carotid Doppler, 1 to 39% stenosis bilateral. CT angiogram of the neck with no emergent large vessel occlusion.  65% stenosis of the proximal right ICA. 2D echocardiogram, normal ejection fraction.  Severe left atrial dilatation. Antiplatelet therapy, none at home.  Allergy to aspirin.   Continue Plavix 75 mg daily uninterrupted. LDL 66.  At goal.  On atorvastatin 10 mg at home, neurology recommended to change to 40 mg daily. Hemoglobin A1c, 6.  Diet modification advised.  No treatment needed. Therapy recommendations, home health PT OT. Neurology clinic follow-up. Given significant left atrial enlargement, suspect A-fib.  Patient without evidence of arrhythmias on telemetry monitor.  Cardiology clinic updated.  They will send Holter monitor at home.   Blood pressure is well-controlled, resume home medications. Rheumatoid arthritis: On Plaquenil.  Continue.   Stable discharge.  To go home with family.  Outpatient follow-up.   Discharge Diagnoses:  06/25/23 (!) 144/66  05/19/23 132/74    Wt Readings from Last 3 Encounters:  06/23/23 157 lb 6.4 oz (71.4 kg)  05/19/23 156 lb (70.8 kg)  03/03/23 157 lb 8 oz (71.4 kg)    Physical Exam Vitals reviewed.  HENT:     Nose: Nose normal.     Mouth/Throat:     Mouth: Mucous membranes are moist.  Eyes:     General: No scleral icterus.    Conjunctiva/sclera: Conjunctivae normal.  Cardiovascular:     Rate and Rhythm: Normal rate and regular rhythm.     Heart sounds: No murmur heard.    No gallop.  Pulmonary:     Effort: Pulmonary effort is normal.     Breath sounds: No stridor. No wheezing, rhonchi or rales.  Abdominal:     General: Abdomen is flat.     Palpations: There is no mass.     Tenderness: There is no abdominal tenderness. There is no guarding.     Hernia: No hernia is  present.  Musculoskeletal:        General: Normal range of motion.     Cervical back: Neck supple.     Right lower leg: No edema.     Left lower leg: No edema.  Lymphadenopathy:     Cervical: No cervical adenopathy.  Skin:    General: Skin is warm and dry.  Neurological:     Mental Status: Mental status is at baseline.     Sensory: Sensory deficit present.     Motor: Weakness present.     Coordination: Coordination abnormal.     Gait: Gait abnormal.     Deep Tendon Reflexes: Reflexes abnormal.     Comments: In a wheelchair  Psychiatric:        Mood and Affect: Mood normal.        Behavior: Behavior normal.     Lab Results  Component Value Date   WBC 9.0 06/23/2023   HGB 12.8 06/23/2023   HCT 37.7 06/23/2023   PLT 183 06/23/2023   GLUCOSE 118 (H) 06/23/2023   CHOL 176 06/24/2023   TRIG 52 06/24/2023   HDL 100 06/24/2023   LDLDIRECT 104.5 04/25/2010   LDLCALC 66 06/24/2023   ALT 25 06/23/2023   AST 26 06/23/2023   NA 140 06/23/2023   K 3.6 06/23/2023   CL 104 06/23/2023   CREATININE 0.84 06/23/2023   BUN 7 (L) 06/23/2023   CO2 25 06/23/2023   TSH 1.440 06/23/2023   INR 0.92 06/05/2014   HGBA1C 6.3 (H) 06/23/2023   MICROALBUR 10.1 (H) 01/07/2023    VAS US CAROTID  Result Date: 06/24/2023 Carotid Arterial Duplex Study Patient Name:  Briana [name]  Date of Exam:   06/24/2023 Medical Rec #: [id number]        Accession #:    [id number] Date of Birth: 10-27-1930       Patient Gender: F Patient Age:   5 years Exam Location:  [facility] Procedure:      VAS US CAROTID Referring Phys: Gevena Mart --------------------------------------------------------------------------------  Indications:       CVA. Risk Factors:      Hypertension, hyperlipidemia, Diabetes. Comparison Study:  No prior studies. Performing Technologist: Chanda Busing RVT  Examination Guidelines: A complete evaluation includes B-mode imaging, spectral Doppler, color Doppler, and power Doppler as  needed of all accessible portions of each vessel. Bilateral testing is considered an integral part of a complete examination. Limited examinations for reoccurring

## 2023-07-05 NOTE — Telephone Encounter (Signed)
Forms have been completed and given to PCP to review and sign.  ?

## 2023-07-06 ENCOUNTER — Telehealth: Payer: Self-pay | Admitting: Internal Medicine

## 2023-07-06 ENCOUNTER — Other Ambulatory Visit: Payer: Self-pay | Admitting: Internal Medicine

## 2023-07-06 DIAGNOSIS — E785 Hyperlipidemia, unspecified: Secondary | ICD-10-CM

## 2023-07-06 DIAGNOSIS — I6300 Cerebral infarction due to thrombosis of unspecified precerebral artery: Secondary | ICD-10-CM

## 2023-07-06 DIAGNOSIS — Z0279 Encounter for issue of other medical certificate: Secondary | ICD-10-CM

## 2023-07-06 MED ORDER — ATORVASTATIN CALCIUM 40 MG PO TABS
40.0000 mg | ORAL_TABLET | Freq: Every day | ORAL | 0 refills | Status: DC
Start: 2023-07-06 — End: 2023-08-11

## 2023-07-06 MED ORDER — CLOPIDOGREL BISULFATE 75 MG PO TABS: 75.0000 mg | ORAL_TABLET | Freq: Every day | ORAL | 0 refills | Status: DC

## 2023-07-06 NOTE — Telephone Encounter (Signed)
Prescription Request  07/06/2023  LOV: 07/05/2023  What is the name of the medication or equipment? Clopidigrel (Plavix), Atorvastatin 40 mg.  Have you contacted your pharmacy to request a refill? Yes   Which pharmacy would you like this sent to?  [facility] - [place], [place] - [address] [place] [address] Phone: [phone] Fax: [phone]   Patient notified that their request is being sent to the clinical staff for review and that they should receive a response within 2 business days.   Please advise at Mobile [phone] (mobile)

## 2023-07-06 NOTE — Telephone Encounter (Signed)
Forms have been signed and faxed.  Copy sent via mail per [facility] request.

## 2023-07-08 DIAGNOSIS — N1832 Chronic kidney disease, stage 3b: Secondary | ICD-10-CM | POA: Diagnosis not present

## 2023-07-08 DIAGNOSIS — M1389 Other specified arthritis, multiple sites: Secondary | ICD-10-CM | POA: Diagnosis not present

## 2023-07-08 DIAGNOSIS — M069 Rheumatoid arthritis, unspecified: Secondary | ICD-10-CM | POA: Diagnosis not present

## 2023-07-08 DIAGNOSIS — I129 Hypertensive chronic kidney disease with stage 1 through stage 4 chronic kidney disease, or unspecified chronic kidney disease: Secondary | ICD-10-CM | POA: Diagnosis not present

## 2023-07-08 DIAGNOSIS — R531 Weakness: Secondary | ICD-10-CM | POA: Diagnosis not present

## 2023-07-08 DIAGNOSIS — H53462 Homonymous bilateral field defects, left side: Secondary | ICD-10-CM | POA: Diagnosis not present

## 2023-07-08 DIAGNOSIS — E1122 Type 2 diabetes mellitus with diabetic chronic kidney disease: Secondary | ICD-10-CM | POA: Diagnosis not present

## 2023-07-08 DIAGNOSIS — I69354 Hemiplegia and hemiparesis following cerebral infarction affecting left non-dominant side: Secondary | ICD-10-CM | POA: Diagnosis not present

## 2023-07-08 DIAGNOSIS — I69398 Other sequelae of cerebral infarction: Secondary | ICD-10-CM | POA: Diagnosis not present

## 2023-07-09 DIAGNOSIS — R531 Weakness: Secondary | ICD-10-CM | POA: Diagnosis not present

## 2023-07-09 DIAGNOSIS — I129 Hypertensive chronic kidney disease with stage 1 through stage 4 chronic kidney disease, or unspecified chronic kidney disease: Secondary | ICD-10-CM | POA: Diagnosis not present

## 2023-07-09 DIAGNOSIS — I69398 Other sequelae of cerebral infarction: Secondary | ICD-10-CM | POA: Diagnosis not present

## 2023-07-09 DIAGNOSIS — M069 Rheumatoid arthritis, unspecified: Secondary | ICD-10-CM | POA: Diagnosis not present

## 2023-07-09 DIAGNOSIS — E1122 Type 2 diabetes mellitus with diabetic chronic kidney disease: Secondary | ICD-10-CM | POA: Diagnosis not present

## 2023-07-09 DIAGNOSIS — H53462 Homonymous bilateral field defects, left side: Secondary | ICD-10-CM | POA: Diagnosis not present

## 2023-07-09 DIAGNOSIS — M1389 Other specified arthritis, multiple sites: Secondary | ICD-10-CM | POA: Diagnosis not present

## 2023-07-09 DIAGNOSIS — I69354 Hemiplegia and hemiparesis following cerebral infarction affecting left non-dominant side: Secondary | ICD-10-CM | POA: Diagnosis not present

## 2023-07-09 DIAGNOSIS — N1832 Chronic kidney disease, stage 3b: Secondary | ICD-10-CM | POA: Diagnosis not present

## 2023-07-13 ENCOUNTER — Telehealth: Payer: Self-pay | Admitting: Internal Medicine

## 2023-07-13 DIAGNOSIS — R531 Weakness: Secondary | ICD-10-CM | POA: Diagnosis not present

## 2023-07-13 DIAGNOSIS — N1832 Chronic kidney disease, stage 3b: Secondary | ICD-10-CM | POA: Diagnosis not present

## 2023-07-13 DIAGNOSIS — E1122 Type 2 diabetes mellitus with diabetic chronic kidney disease: Secondary | ICD-10-CM | POA: Diagnosis not present

## 2023-07-13 DIAGNOSIS — H53462 Homonymous bilateral field defects, left side: Secondary | ICD-10-CM | POA: Diagnosis not present

## 2023-07-13 DIAGNOSIS — I69398 Other sequelae of cerebral infarction: Secondary | ICD-10-CM | POA: Diagnosis not present

## 2023-07-13 DIAGNOSIS — I69354 Hemiplegia and hemiparesis following cerebral infarction affecting left non-dominant side: Secondary | ICD-10-CM | POA: Diagnosis not present

## 2023-07-13 DIAGNOSIS — M1389 Other specified arthritis, multiple sites: Secondary | ICD-10-CM | POA: Diagnosis not present

## 2023-07-13 DIAGNOSIS — I129 Hypertensive chronic kidney disease with stage 1 through stage 4 chronic kidney disease, or unspecified chronic kidney disease: Secondary | ICD-10-CM | POA: Diagnosis not present

## 2023-07-13 DIAGNOSIS — M069 Rheumatoid arthritis, unspecified: Secondary | ICD-10-CM | POA: Diagnosis not present

## 2023-07-13 NOTE — Telephone Encounter (Signed)
Pt daughter [name] called inquiring about enrolling pt in the program called [facility] due to pt had a stroke. Please advise.  Best call back number is [phone]

## 2023-07-14 ENCOUNTER — Other Ambulatory Visit: Payer: Self-pay | Admitting: Internal Medicine

## 2023-07-14 DIAGNOSIS — I639 Cerebral infarction, unspecified: Secondary | ICD-10-CM

## 2023-07-14 NOTE — Telephone Encounter (Signed)
done

## 2023-07-16 ENCOUNTER — Ambulatory Visit: Payer: Self-pay

## 2023-07-16 NOTE — Patient Instructions (Signed)
Visit Information  Thank you for taking time to visit with me today. Please don't hesitate to contact me if I can be of assistance to you.   Following are the goals we discussed today:  Continue to take medications as prescribed. Continue to attend provider visits as scheduled Continue to eat healthy, lean meats, vegetables, fruits, avoid saturated and transfats Continue to check blood sugar as recommended and notify provider if questions or concerns   The Care Guide will call you to schedule next telephone call.  Please call the care guide team at [phone] if you need to cancel or reschedule your appointment.   If you are experiencing a Mental Health or Behavioral Health Crisis or need someone to talk to, please call the Suicide and Crisis Lifeline: 988 call the [place] National Suicide Prevention Lifeline: 854-119-8750 or TTY: 321-457-5609 TTY 620-684-8217) to talk to a trained counselor call 1-800-273-TALK (toll free, 24 hour hotline)   Kathyrn Sheriff, RN, MSN, BSN, CCM Care Management Coordinator [phone]

## 2023-07-16 NOTE — Patient Outreach (Signed)
Care Coordination   Initial Visit Note   07/16/2023 Name: Briana [name] MRN: [id number] DOB: September 27, 1931  Briana [name] is a 89 y.o. year old female who sees Briana Grandchild, MD for primary care. I spoke with  Briana [name] by phone today. RNCM also spoke with daughter Briana [name] per patient request.  What matters to the patients health and wellness today?  RNCM spoke with patient who reports she is doing well. She reports her son lives with her-has supportive family. She states her daughter prepares her medications and her son ensures she takes it. Also she states her son and family prepare meals. Confirms active with  [facility] home [facility] and is interested in-home care aid.  Goals Addressed             This Visit's Progress    Assist with health management post hsopitalization       Interventions Today    Flowsheet Row Most Recent Value  Chronic Disease   Chronic disease during today's visit Other  [stroke]  General Interventions   General Interventions Discussed/Reviewed General Interventions Reviewed, Doctor Visits  [Evaluation of current treatment plan for health condition and patient's adherence to plan.]  Doctor Visits Discussed/Reviewed Doctor Visits Discussed, PCP, Specialist  PCP/Specialist Visits Compliance with follow-up visit  [reviewed upcoming/scheduled appointments]  Education Interventions   Provided Verbal Education On Medication, When to see the doctor, Blood Sugar Monitoring  Mental Health Interventions   Mental Health Discussed/Reviewed Refer to Social Work for resources  Refer to Social Work for resources regarding Other  [in home aide and community resources]  Nutrition Interventions   Nutrition Discussed/Reviewed Nutrition Discussed  Pharmacy Interventions   Pharmacy Dicussed/Reviewed Pharmacy Topics Discussed  Safety Interventions   Safety Discussed/Reviewed Safety Discussed, Fall Risk, Home Safety  Home Safety Assistive Devices             SDOH assessments and interventions completed:  No recently completed, per patient no changes.   Care Coordination Interventions:  Yes, provided   Follow up plan: Referral made to care guide to schedule next follow up    Encounter Outcome:  Patient Visit Completed

## 2023-07-19 ENCOUNTER — Telehealth: Payer: Self-pay | Admitting: Internal Medicine

## 2023-07-19 ENCOUNTER — Other Ambulatory Visit: Payer: Self-pay | Admitting: Internal Medicine

## 2023-07-19 ENCOUNTER — Telehealth: Payer: Self-pay | Admitting: *Deleted

## 2023-07-19 DIAGNOSIS — I6300 Cerebral infarction due to thrombosis of unspecified precerebral artery: Secondary | ICD-10-CM

## 2023-07-19 NOTE — Telephone Encounter (Signed)
Pt daughter wanting to know if it okay for pt to take the COVID vaccine shot. Please  advised.   Best call back # [name] (daughter) [phone]

## 2023-07-19 NOTE — Telephone Encounter (Signed)
Pt's daughter has been informed that it is ok to proceed with COVID vaccine for the pt.

## 2023-07-19 NOTE — Progress Notes (Signed)
Care Coordination Note  07/19/2023 Name: MERYL SROCZYNSKI MRN: [id number] DOB: July 30, 1931  Clide Deutscher Gasior is a 89 y.o. year old female who is a primary care patient of Etta Grandchild, MD and is actively engaged with the care management team. I reached out to [name] by phone today to assist with scheduling an initial visit with the BSW and follow up with RN CC  Follow up plan: Telephone appointment with care management team member scheduled for: 07/26/2023 and follow up with RN for 08/19/2023  Burman Nieves, [facility] Care Coordination Care Guide Direct Dial: [phone]

## 2023-07-21 DIAGNOSIS — H53462 Homonymous bilateral field defects, left side: Secondary | ICD-10-CM | POA: Diagnosis not present

## 2023-07-21 DIAGNOSIS — I69398 Other sequelae of cerebral infarction: Secondary | ICD-10-CM | POA: Diagnosis not present

## 2023-07-21 DIAGNOSIS — M1389 Other specified arthritis, multiple sites: Secondary | ICD-10-CM | POA: Diagnosis not present

## 2023-07-21 DIAGNOSIS — R531 Weakness: Secondary | ICD-10-CM | POA: Diagnosis not present

## 2023-07-21 DIAGNOSIS — N1832 Chronic kidney disease, stage 3b: Secondary | ICD-10-CM | POA: Diagnosis not present

## 2023-07-21 DIAGNOSIS — E1122 Type 2 diabetes mellitus with diabetic chronic kidney disease: Secondary | ICD-10-CM | POA: Diagnosis not present

## 2023-07-21 DIAGNOSIS — I129 Hypertensive chronic kidney disease with stage 1 through stage 4 chronic kidney disease, or unspecified chronic kidney disease: Secondary | ICD-10-CM | POA: Diagnosis not present

## 2023-07-21 DIAGNOSIS — M069 Rheumatoid arthritis, unspecified: Secondary | ICD-10-CM | POA: Diagnosis not present

## 2023-07-21 DIAGNOSIS — I69354 Hemiplegia and hemiparesis following cerebral infarction affecting left non-dominant side: Secondary | ICD-10-CM | POA: Diagnosis not present

## 2023-07-22 DIAGNOSIS — M1389 Other specified arthritis, multiple sites: Secondary | ICD-10-CM | POA: Diagnosis not present

## 2023-07-22 DIAGNOSIS — N1832 Chronic kidney disease, stage 3b: Secondary | ICD-10-CM | POA: Diagnosis not present

## 2023-07-22 DIAGNOSIS — I69354 Hemiplegia and hemiparesis following cerebral infarction affecting left non-dominant side: Secondary | ICD-10-CM | POA: Diagnosis not present

## 2023-07-22 DIAGNOSIS — R531 Weakness: Secondary | ICD-10-CM | POA: Diagnosis not present

## 2023-07-22 DIAGNOSIS — I69398 Other sequelae of cerebral infarction: Secondary | ICD-10-CM | POA: Diagnosis not present

## 2023-07-22 DIAGNOSIS — H53462 Homonymous bilateral field defects, left side: Secondary | ICD-10-CM | POA: Diagnosis not present

## 2023-07-22 DIAGNOSIS — I129 Hypertensive chronic kidney disease with stage 1 through stage 4 chronic kidney disease, or unspecified chronic kidney disease: Secondary | ICD-10-CM | POA: Diagnosis not present

## 2023-07-22 DIAGNOSIS — E1122 Type 2 diabetes mellitus with diabetic chronic kidney disease: Secondary | ICD-10-CM | POA: Diagnosis not present

## 2023-07-22 DIAGNOSIS — M069 Rheumatoid arthritis, unspecified: Secondary | ICD-10-CM | POA: Diagnosis not present

## 2023-07-23 DIAGNOSIS — Z23 Encounter for immunization: Secondary | ICD-10-CM | POA: Diagnosis not present

## 2023-07-26 ENCOUNTER — Ambulatory Visit: Payer: Self-pay

## 2023-07-26 NOTE — Progress Notes (Addendum)
 Patient: Briana [name] Date of Birth: 1931-08-09  Reason for Visit: Stroke clinic follow up  History from: Patient, daughter [name] Primary Neurologist: Pearlean Brownie  ASSESSMENT AND PLAN 89 y.o. year old female presented with dizziness and blurry vision.  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  On Plavix for secondary stroke prevention as monotherapy, allergy to aspirin.  Echo showed severe left atrial dilation.  Vascular risk factors: HTN, HLD.   -Left homonymous hemianopsia noted, no left-sided weakness was noted.  Does seem to have more difficulty with fine motor skill use with the left side. -Continue Plavix 75 mg daily for secondary stroke prevention -I will order 30-day cardiac monitor to rule out A-fib -Strict management of vascular risk factors with a goal BP less than 130/90, A1c less than 7.0, LDL less than 70 for secondary stroke prevention -Recommend continue home health PT/OT -Keep close follow-up with PCP, return here in 6 months or sooner if needed  Addendum 09/22/2023 SS: 30-day cardiac monitor was unremarkable, no evidence of atrial fibrillation.  I reviewed with Dr. Pearlean Brownie.  Will send patient to EP for consideration of loop recorder.  Concerning for atrial fibrillation.  Echocardiogram showed severe left atrial dilation. I called the patient and discussed the above and her daughter. She is 89, but remains quite functional and cognitively intact.   Addendum 12/13/23 SS: patient saw Dr. Lalla Brothers cardiology EP, they discussed loop recorder. Patient ultimately decided not to proceed.   HISTORY OF PRESENT ILLNESS: Today 07/27/23 Here with her daughter, [name]. Describes main concern for stroke was vertigo, not feeling too good, brain fog. Had COVID in July lead up to this. She never got cardiac monitor after discharge. She  lives with her son. 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 Left sided vision issues.  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 Denies any weakness to left sided. Using cane, she walked from the car into the building. At home she has a walker. She was using walker before stroke. Remains on Plavix. Speech is slightly dysarthric, mild stutter. In good spirits. On Lipitor 40. BP 144/72 on atenolol + amlodipine. Daughter sets out medication. No concerns about memory.  HISTORY  Presented to the ER 06/23/23 with dizziness and blurry vision.  Few days prior she had been feeling off after being diagnosed with COVID.  Had been feeling off balance.  Found to have hemorrhagic right parietal infarct of white matter, subacute, questionable very small left cerebellar infarct versus CSF cyst.  Started on Plavix 75 mg daily for secondary stroke prevention as monotherapy, allergy to aspirin.  Echo showed severe left atrial dilation.  Holter monitor for 30 days.  Concern for A-fib as etiology in setting of new finding of severe left atrial dilation  Code Stroke CT head: Age indeterminate infarcts of left cerebellar hemisphere and right parietal lobe (likely chronic. CTA head & neck: No LVO. 65% stenosis of proximal R ICA 2/2 bulky calcification.  MRI: Intermediate subacute/chronic R parietal infarct of white matter with nearby encephalomalacia  Carotid Doppler: Bilateral no significant stenosis.   2D Echo: Left atrial dilation LDL 66 HgbA1c 6.0 VTE prophylaxis - None prior to admission. Will discharge on antiplatelet monotherapy with Plavix.  REVIEW OF SYSTEMS: Out of a complete 14 system review of symptoms, the patient complains only of the following symptoms, and all other reviewed systems are negative.  See HPI  ALLERGIES: Allergies  Allergen Reactions   Ibuprofen Anaphylaxis  Percocet [Oxycodone-Acetaminophen]     nausea   Simvastatin Other (See Comments)    muscle aches   2,4-D Dimethylamine Other (See Comments)   Diltiazem Hcl     REACTION: ANGIO EDEMA   Hydrochlorothiazide     REACTION:  SWELLING   Lisinopril     REACTION: SWELLING   Valacyclovir Other (See Comments)   Verapamil     REACTION: SWELLING    HOME MEDICATIONS: Outpatient Medications Prior to Visit  Medication Sig Dispense Refill   acetaminophen (TYLENOL) 650 MG CR tablet Take 650 mg by mouth every 8 (eight) hours as needed for pain.     amLODipine (NORVASC) 10 MG tablet Take 1 tablet (10 mg total) by mouth daily. 90 tablet 1   atenolol (TENORMIN) 100 MG tablet Take 1 tablet (100 mg total) by mouth daily. 90 tablet 1   atorvastatin (LIPITOR) 40 MG tablet Take 1 tablet (40 mg total) by mouth daily. 90 tablet 0   b complex vitamins tablet Take 1 tablet by mouth daily.     Cholecalciferol (VITAMIN D) 2000 UNITS tablet Take 2,000 Units by mouth daily.     clopidogrel (PLAVIX) 75 MG tablet TAKE 1 TABLET EVERY DAY 90 tablet 0   cyanocobalamin (VITAMIN B12) 1000 MCG tablet Take 1,000 mcg by mouth daily.     dextromethorphan-guaiFENesin (MUCINEX DM) 30-600 MG 12hr tablet Take 1 tablet by mouth 2 (two) times daily.     dorzolamide-timolol (COSOPT) 22.3-6.8 MG/ML ophthalmic solution Place 1 drop into both eyes 2 (two) times daily.     fexofenadine (ALLEGRA) 180 MG tablet Take 180 mg by mouth daily.     hydroxychloroquine (PLAQUENIL) 200 MG tablet Take 200 mg by mouth 2 (two) times daily.     latanoprost (XALATAN) 0.005 % ophthalmic solution Place 1 drop into both eyes at bedtime.     Netarsudil Dimesylate (RHOPRESSA) 0.02 % SOLN Place 1 drop into the right eye at bedtime.     promethazine (PHENERGAN) 12.5 MG tablet TAKE 1 TABLET BY MOUTH EVERY 6 HOURS AS NEEDED FOR NAUSEA OR VOMITING 30 tablet 0   traMADol (ULTRAM) 50 MG tablet TAKE 1 TABLET EVERY 12 HOURS AS NEEDED FOR MODERATE PAIN (Patient taking differently: Take 50 mg by mouth See admin instructions. TAKE 1 TABLET EVERY 12 HOURS AS NEEDED FOR MODERATE PAIN) 60 tablet 5   No facility-administered medications prior to visit.    PAST MEDICAL HISTORY: Past Medical  History:  Diagnosis Date   ACUTE URIS OF UNSPECIFIED SITE 11/26/2008   ALLERGIC ARTHRITIS OTHER SPECIFIED SITES 10/22/2010   OA + RA   ALLERGIC RHINITIS 10/28/2007   ARTHRITIS, KNEES, BILATERAL 10/28/2007   BACK PAIN 04/12/2009   BUNIONECTOMY, HX OF 10/28/2007   CELLULITIS&ABSCESS OF HAND EXCEPT FINGERS&THUMB 10/23/2010   Complication of anesthesia    itching and makes me crazy   Family history of anesthesia complication    DAUGHTER HAD HIVES AND " WENT WILD "   HYPERLIPIDEMIA 10/28/2007   HYPERTENSION 10/28/2007   INSOMNIA UNSPECIFIED 10/31/2007   MYOSITIS 10/22/2010   NECK PAIN 12/03/2008   Pain in joint, site unspecified 10/07/2010   WRIST PAIN, LEFT 10/22/2010    PAST SURGICAL HISTORY: Past Surgical History:  Procedure Laterality Date   arthroscopic surgery and rotator cuff repair left shoulder     arthroscopy right knee hx of surgery     BUNIONECTOMY     hx of   FOOT SURGERY     RIGHT   hand surgery for  broken finger, remote     JOINT REPLACEMENT Right    knee   TOTAL KNEE ARTHROPLASTY  07/01/2012   Procedure: TOTAL KNEE ARTHROPLASTY;  Surgeon: Thera Flake., MD;  Location: [facility] OR;  Service: Orthopedics;  Laterality: Right;  right total knee arthroplasty   TOTAL KNEE ARTHROPLASTY Left 06/13/2014   dr Eulah Pont   TOTAL KNEE ARTHROPLASTY Left 06/13/2014   Procedure: TOTAL KNEE ARTHROPLASTY;  Surgeon: Loreta Ave, MD;  Location: [facility] OR;  Service: Orthopedics;  Laterality: Left;    FAMILY HISTORY: Family History  Problem Relation Age of Onset   Cancer Brother        lung cancer   Coronary artery disease Other    Heart attack Other    Cancer Sister        Lung, stomach    SOCIAL HISTORY: Social History   Socioeconomic History   Marital status: Widowed    Spouse name: Not on file   Number of children: 4   Years of education: 12   Highest education level: Not on file  Occupational History   Occupation: ASSN II 1    Employer: [employer]  Tobacco Use   Smoking  status: Never   Smokeless tobacco: Never  Vaping Use   Vaping status: Never Used  Substance and Sexual Activity   Alcohol use: No   Drug use: No   Sexual activity: Not Currently  Other Topics Concern   Not on file  Social History Narrative   Married in [place] widowed on 00   4 sons, 1 daughter, 6 grandchildren, 3 great-grands   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   Social Determinants of Health   Financial Resource Strain: Low Risk  (12/10/2022)   Overall Financial Resource Strain (CARDIA)    Difficulty of Paying Living Expenses: Not hard at all  Food Insecurity: No Food Insecurity (06/29/2023)   Hunger Vital Sign    Worried About Running Out of Food in the Last Year: Never true    Ran Out of Food in the Last Year: Never true  Transportation Needs: No Transportation Needs (06/29/2023)   PRAPARE - Administrator, Civil Service (Medical): No    Lack of Transportation (Non-Medical): No  Physical Activity: Insufficiently Active (12/10/2022)   Exercise Vital Sign    Days of Exercise per Week: 7 days    Minutes of Exercise per Session: 10 min  Stress: No Stress Concern Present (12/10/2022)   [employer] of Occupational Health - Occupational Stress Questionnaire    Feeling of Stress : Only a little  Social Connections: Moderately Integrated (12/10/2022)   Social Connection and Isolation Panel [NHANES]    Frequency of Communication with Friends and Family: More than three times a week    Frequency of Social Gatherings with Friends and Family: More than three times a week    Attends Religious Services: More than 4 times per year    Active Member of [employer] or Organizations: Yes    Attends Banker Meetings: More than 4 times per year    Marital Status: Widowed  Intimate Partner Violence: Not At Risk (06/24/2023)   Humiliation, Afraid, Rape, and Kick questionnaire    Fear of Current or Ex-Partner: No     Emotionally Abused: No    Physically Abused: No    Sexually Abused: No   PHYSICAL EXAM  Vitals:  07/27/23 1013  BP: (!) 144/72  Pulse: (!) 57  Weight: 157 lb (71.2 kg)  Height: 5\' 4"  (1.626 m)   Body mass index is 26.95 kg/m.  Generalized: Well developed, in no acute distress, elderly female, seated in wheelchair Neurological examination  Mentation: Alert oriented to time, place, history taking. Follows all commands, speech is mildly dysarthric, few times noted stutter Cranial nerve II-XII: Pupils were equal round reactive to light.  Left homonymous hemianopsia.  Facial sensation and strength were normal.  Head turning and shoulder shrug  were normal and symmetric. Motor: The motor testing reveals 5 over 5 strength of all 4 extremities. Good symmetric motor tone is noted throughout.  I do not see any weakness to the left side. Sensory: Reports sensation to the right and left side is normal, but appears some mild left-sided neglect Coordination: Mild dysmetria with left finger-nose-finger, left heel-to-shin. Gait and station: Gait is wide-based, cautious, short steps, requires examiner assistance.  DIAGNOSTIC DATA (LABS, IMAGING, TESTING) - I reviewed patient records, labs, notes, testing and imaging myself where available.  Lab Results  Component Value Date   WBC 9.0 06/23/2023   HGB 12.8 06/23/2023   HCT 37.7 06/23/2023   MCV 89.3 06/23/2023   PLT 183 06/23/2023      Component Value Date/Time   NA 140 06/23/2023 2156   NA 144 04/22/2017 0000   K 3.6 06/23/2023 2156   CL 104 06/23/2023 2156   CO2 25 06/23/2023 2156   GLUCOSE 118 (H) 06/23/2023 2156   BUN 7 (L) 06/23/2023 2156   BUN 20 04/22/2017 0000   CREATININE 0.84 06/23/2023 2156   CREATININE 0.95 (H) 05/29/2020 1043   CALCIUM 9.4 06/23/2023 2156   PROT 8.0 06/23/2023 2156   ALBUMIN 3.8 06/23/2023 2156   AST 26 06/23/2023 2156   ALT 25 06/23/2023 2156   ALKPHOS 114 06/23/2023 2156   BILITOT 0.9  06/23/2023 2156   GFRNONAA >60 06/23/2023 2156   GFRNONAA 54 (L) 05/29/2020 1043   GFRAA 63 05/29/2020 1043   Lab Results  Component Value Date   CHOL 176 06/24/2023   HDL 100 06/24/2023   LDLCALC 66 06/24/2023   LDLDIRECT 104.5 04/25/2010   TRIG 52 06/24/2023   CHOLHDL 1.8 06/24/2023   Lab Results  Component Value Date   HGBA1C 6.3 (H) 06/23/2023   Lab Results  Component Value Date   VITAMINB12 577 07/06/2014   Lab Results  Component Value Date   TSH 1.440 06/23/2023    [name] Ege, AGNP-C, DNP 07/27/2023, 10:30 AM [facility] [address], [address] [place], [place] [address] [phone]

## 2023-07-26 NOTE — Patient Instructions (Signed)
Visit Information  Thank you for taking time to visit with me today. Please don't hesitate to contact me if I can be of assistance to you.   Following are the goals we discussed today:  Patients daughter will contact DSS for Medicaid and CAP with Public Health.   If you are experiencing a Mental Health or Behavioral Health Crisis or need someone to talk to, please call 911  Patient verbalizes understanding of instructions and care plan provided today and agrees to view in MyChart. Active MyChart status and patient understanding of how to access instructions and care plan via MyChart confirmed with patient.     Lysle Morales, BSW Social Worker [phone]

## 2023-07-26 NOTE — Patient Outreach (Signed)
Care Coordination   Initial Visit Note   07/26/2023 Name: Briana [name] MRN: [id number] DOB: 1931/03/02  Briana [name] is a 89 y.o. year old female who sees Briana Grandchild, MD for primary care. I spoke with  Briana [name] daughter Briana [name] by phone today.  What matters to the patients health and wellness today?  Patients daughter is requesting resources for personal care.      Goals Addressed             This Visit's Progress    CAP application       Interventions Today    Flowsheet Row Most Recent Value  Chronic Disease   Chronic disease during today's visit Diabetes, Hypertension (HTN), Other  [stroke]  General Interventions   General Interventions Discussed/Reviewed General Interventions Discussed, General Interventions Reviewed, Community Resources, Level of Care  [Pt has PT/OT. Pt children provide care & son lives in the home.Daughter reports pt. has limited income and can't afford private pay for personal care. SW recommends CAP/Medicaid at DSS/Public Health.]              SDOH assessments and interventions completed:  Yes     Care Coordination Interventions:  Yes, provided   Follow up plan:  Daughter Briana [name] to call back with an available time.    Encounter Outcome:  Patient Visit Completed

## 2023-07-27 ENCOUNTER — Ambulatory Visit (INDEPENDENT_AMBULATORY_CARE_PROVIDER_SITE_OTHER): Payer: Medicare HMO | Admitting: Neurology

## 2023-07-27 ENCOUNTER — Encounter: Payer: Self-pay | Admitting: Neurology

## 2023-07-27 VITALS — BP 144/72 | HR 57 | Ht 64.0 in | Wt 157.0 lb

## 2023-07-27 DIAGNOSIS — I10 Essential (primary) hypertension: Secondary | ICD-10-CM | POA: Diagnosis not present

## 2023-07-27 DIAGNOSIS — I639 Cerebral infarction, unspecified: Secondary | ICD-10-CM | POA: Diagnosis not present

## 2023-07-27 DIAGNOSIS — E118 Type 2 diabetes mellitus with unspecified complications: Secondary | ICD-10-CM

## 2023-07-27 NOTE — Patient Instructions (Signed)
I ordered cardiac monitor study  Strict management of vascular risk factors with a goal BP less than 130/90, A1c less than 7.0, LDL less than 70 for secondary stroke prevention Continue Plavix for secondary stroke prevention  Continue working with home health  Continue to see primary care doctor See you back in 6 months

## 2023-07-28 ENCOUNTER — Telehealth: Payer: Self-pay

## 2023-07-28 NOTE — Telephone Encounter (Signed)
Patient's daughter [name]) called wanting to know if an order would have to be placed for her to continue PT services when she becomes a new patient with Dr. Jacquenette Shone. [name] would like to know if home PT is good for the patient. [name] was advised that she may have to wait until appointment on 08/11/23 to discuss more with Dr. Jacquenette Shone.

## 2023-07-29 DIAGNOSIS — H53462 Homonymous bilateral field defects, left side: Secondary | ICD-10-CM | POA: Diagnosis not present

## 2023-07-29 DIAGNOSIS — R531 Weakness: Secondary | ICD-10-CM | POA: Diagnosis not present

## 2023-07-29 DIAGNOSIS — M1389 Other specified arthritis, multiple sites: Secondary | ICD-10-CM | POA: Diagnosis not present

## 2023-07-29 DIAGNOSIS — E1122 Type 2 diabetes mellitus with diabetic chronic kidney disease: Secondary | ICD-10-CM | POA: Diagnosis not present

## 2023-07-29 DIAGNOSIS — N1832 Chronic kidney disease, stage 3b: Secondary | ICD-10-CM | POA: Diagnosis not present

## 2023-07-29 DIAGNOSIS — I69354 Hemiplegia and hemiparesis following cerebral infarction affecting left non-dominant side: Secondary | ICD-10-CM | POA: Diagnosis not present

## 2023-07-29 DIAGNOSIS — I69398 Other sequelae of cerebral infarction: Secondary | ICD-10-CM | POA: Diagnosis not present

## 2023-07-29 DIAGNOSIS — I129 Hypertensive chronic kidney disease with stage 1 through stage 4 chronic kidney disease, or unspecified chronic kidney disease: Secondary | ICD-10-CM | POA: Diagnosis not present

## 2023-07-29 DIAGNOSIS — M069 Rheumatoid arthritis, unspecified: Secondary | ICD-10-CM | POA: Diagnosis not present

## 2023-07-30 NOTE — Telephone Encounter (Signed)
I agree.Please discuss PT orders during the visit with Dr since visit notes will be required to order therapy.

## 2023-08-02 ENCOUNTER — Other Ambulatory Visit: Payer: Self-pay | Admitting: Internal Medicine

## 2023-08-02 DIAGNOSIS — M170 Bilateral primary osteoarthritis of knee: Secondary | ICD-10-CM

## 2023-08-02 DIAGNOSIS — M05711 Rheumatoid arthritis with rheumatoid factor of right shoulder without organ or systems involvement: Secondary | ICD-10-CM

## 2023-08-04 DIAGNOSIS — N1832 Chronic kidney disease, stage 3b: Secondary | ICD-10-CM | POA: Diagnosis not present

## 2023-08-04 DIAGNOSIS — M069 Rheumatoid arthritis, unspecified: Secondary | ICD-10-CM | POA: Diagnosis not present

## 2023-08-04 DIAGNOSIS — E1122 Type 2 diabetes mellitus with diabetic chronic kidney disease: Secondary | ICD-10-CM | POA: Diagnosis not present

## 2023-08-04 DIAGNOSIS — R531 Weakness: Secondary | ICD-10-CM | POA: Diagnosis not present

## 2023-08-04 DIAGNOSIS — I129 Hypertensive chronic kidney disease with stage 1 through stage 4 chronic kidney disease, or unspecified chronic kidney disease: Secondary | ICD-10-CM | POA: Diagnosis not present

## 2023-08-04 DIAGNOSIS — H53462 Homonymous bilateral field defects, left side: Secondary | ICD-10-CM | POA: Diagnosis not present

## 2023-08-04 DIAGNOSIS — I69354 Hemiplegia and hemiparesis following cerebral infarction affecting left non-dominant side: Secondary | ICD-10-CM | POA: Diagnosis not present

## 2023-08-04 DIAGNOSIS — M1389 Other specified arthritis, multiple sites: Secondary | ICD-10-CM | POA: Diagnosis not present

## 2023-08-04 DIAGNOSIS — I69398 Other sequelae of cerebral infarction: Secondary | ICD-10-CM | POA: Diagnosis not present

## 2023-08-05 ENCOUNTER — Other Ambulatory Visit: Payer: Self-pay | Admitting: Internal Medicine

## 2023-08-05 DIAGNOSIS — E1122 Type 2 diabetes mellitus with diabetic chronic kidney disease: Secondary | ICD-10-CM | POA: Diagnosis not present

## 2023-08-05 DIAGNOSIS — R058 Other specified cough: Secondary | ICD-10-CM

## 2023-08-05 DIAGNOSIS — J208 Acute bronchitis due to other specified organisms: Secondary | ICD-10-CM

## 2023-08-05 DIAGNOSIS — J069 Acute upper respiratory infection, unspecified: Secondary | ICD-10-CM

## 2023-08-05 DIAGNOSIS — I639 Cerebral infarction, unspecified: Secondary | ICD-10-CM | POA: Diagnosis not present

## 2023-08-05 DIAGNOSIS — M069 Rheumatoid arthritis, unspecified: Secondary | ICD-10-CM | POA: Diagnosis not present

## 2023-08-05 DIAGNOSIS — M1389 Other specified arthritis, multiple sites: Secondary | ICD-10-CM | POA: Diagnosis not present

## 2023-08-05 DIAGNOSIS — N1832 Chronic kidney disease, stage 3b: Secondary | ICD-10-CM | POA: Diagnosis not present

## 2023-08-05 DIAGNOSIS — I129 Hypertensive chronic kidney disease with stage 1 through stage 4 chronic kidney disease, or unspecified chronic kidney disease: Secondary | ICD-10-CM | POA: Diagnosis not present

## 2023-08-05 DIAGNOSIS — H53462 Homonymous bilateral field defects, left side: Secondary | ICD-10-CM | POA: Diagnosis not present

## 2023-08-05 DIAGNOSIS — R531 Weakness: Secondary | ICD-10-CM | POA: Diagnosis not present

## 2023-08-05 DIAGNOSIS — I69398 Other sequelae of cerebral infarction: Secondary | ICD-10-CM | POA: Diagnosis not present

## 2023-08-05 DIAGNOSIS — I69354 Hemiplegia and hemiparesis following cerebral infarction affecting left non-dominant side: Secondary | ICD-10-CM | POA: Diagnosis not present

## 2023-08-06 NOTE — Plan of Care (Signed)
 [facility] Tonsillectomy/Adenoidectomy, Postoperative PEDS care plan entered in error.

## 2023-08-09 ENCOUNTER — Ambulatory Visit: Payer: Medicare HMO | Admitting: Internal Medicine

## 2023-08-09 DIAGNOSIS — M1389 Other specified arthritis, multiple sites: Secondary | ICD-10-CM | POA: Diagnosis not present

## 2023-08-09 DIAGNOSIS — H53462 Homonymous bilateral field defects, left side: Secondary | ICD-10-CM | POA: Diagnosis not present

## 2023-08-09 DIAGNOSIS — E1122 Type 2 diabetes mellitus with diabetic chronic kidney disease: Secondary | ICD-10-CM | POA: Diagnosis not present

## 2023-08-09 DIAGNOSIS — I129 Hypertensive chronic kidney disease with stage 1 through stage 4 chronic kidney disease, or unspecified chronic kidney disease: Secondary | ICD-10-CM | POA: Diagnosis not present

## 2023-08-09 DIAGNOSIS — M069 Rheumatoid arthritis, unspecified: Secondary | ICD-10-CM | POA: Diagnosis not present

## 2023-08-09 DIAGNOSIS — N1832 Chronic kidney disease, stage 3b: Secondary | ICD-10-CM | POA: Diagnosis not present

## 2023-08-09 DIAGNOSIS — I69398 Other sequelae of cerebral infarction: Secondary | ICD-10-CM | POA: Diagnosis not present

## 2023-08-09 DIAGNOSIS — R531 Weakness: Secondary | ICD-10-CM | POA: Diagnosis not present

## 2023-08-09 DIAGNOSIS — I69354 Hemiplegia and hemiparesis following cerebral infarction affecting left non-dominant side: Secondary | ICD-10-CM | POA: Diagnosis not present

## 2023-08-10 ENCOUNTER — Encounter: Payer: Self-pay | Admitting: Sports Medicine

## 2023-08-11 ENCOUNTER — Encounter: Payer: Self-pay | Admitting: Sports Medicine

## 2023-08-11 ENCOUNTER — Ambulatory Visit: Payer: Medicare HMO | Admitting: Internal Medicine

## 2023-08-11 ENCOUNTER — Ambulatory Visit (INDEPENDENT_AMBULATORY_CARE_PROVIDER_SITE_OTHER): Payer: Medicare HMO | Admitting: Sports Medicine

## 2023-08-11 VITALS — BP 128/76 | HR 62 | Temp 97.5°F | Resp 18 | Ht 64.0 in | Wt 154.4 lb

## 2023-08-11 DIAGNOSIS — I10 Essential (primary) hypertension: Secondary | ICD-10-CM | POA: Diagnosis not present

## 2023-08-11 DIAGNOSIS — E785 Hyperlipidemia, unspecified: Secondary | ICD-10-CM | POA: Diagnosis not present

## 2023-08-11 DIAGNOSIS — I6300 Cerebral infarction due to thrombosis of unspecified precerebral artery: Secondary | ICD-10-CM

## 2023-08-11 DIAGNOSIS — R110 Nausea: Secondary | ICD-10-CM | POA: Diagnosis not present

## 2023-08-11 DIAGNOSIS — M069 Rheumatoid arthritis, unspecified: Secondary | ICD-10-CM | POA: Diagnosis not present

## 2023-08-11 MED ORDER — CLOPIDOGREL BISULFATE 75 MG PO TABS: 75.0000 mg | ORAL_TABLET | Freq: Every day | ORAL | 3 refills | Status: DC

## 2023-08-11 MED ORDER — PROMETHAZINE HCL 12.5 MG PO TABS: 12.5000 mg | ORAL_TABLET | Freq: Four times a day (QID) | ORAL | 1 refills | Status: DC | PRN

## 2023-08-11 MED ORDER — ATORVASTATIN CALCIUM 40 MG PO TABS
40.0000 mg | ORAL_TABLET | Freq: Every day | ORAL | 3 refills | Status: DC
Start: 2023-08-11 — End: 2024-06-30

## 2023-08-11 NOTE — Progress Notes (Signed)
Careteam: Patient Care Team: Briana Sheffield, MD as PCP - General (Internal Medicine) Briana Jordan, MD as Consulting Physician (Rheumatology) Briana Rutherford, MD as Consulting Physician (Ophthalmology) Briana Boop, MD as Consulting Physician (Gastroenterology) Briana [name], [facility] (Inactive) as Pharmacist (Pharmacist) Briana Salvo, NP as Nurse Practitioner (Neurology)  PLACE OF SERVICE:  [facility]  Advanced Directive information Does Patient Have a Medical Advance Directive?: No, Would patient like information on creating a medical advance directive?: No - Patient declined  Allergies  Allergen Reactions   Ibuprofen Anaphylaxis   Percocet [Oxycodone-Acetaminophen]     nausea   Simvastatin Other (See Comments)    muscle aches   2,4-D Dimethylamine Other (See Comments)   Diltiazem Hcl     REACTION: ANGIO EDEMA   Hydrochlorothiazide     REACTION: SWELLING   Lisinopril     REACTION: SWELLING   Valacyclovir Other (See Comments)   Verapamil     REACTION: SWELLING    Chief Complaint  Patient presents with   Establish Care    New Patient.      HPI: Patient is a 89 y.o. [name]  is here to establish care  Lives with her son  Accompanied by her daughter  Pt had recent stroke  Her kids help with medication management  Denies problem with swallowing  Denies coughing or chocking Denies double vision, weakness  Followed with neurology 2 weeks ago    HTN  Denies feeling dizzy or lightheaded On atenolol, amlodipine  Rheumatoid arthritis  Follows with rheumatology  On plaquenil   H/o Glaucoma Follows with ophthalomology  As per daughter no memory concerns  OA  left knee  S/p TKR  Denies lower back pain  On tramadol prn for pain  Ambulates with a cane No recent falls     Review of Systems:  Review of Systems  Constitutional:  Negative for chills and fever.  HENT:  Negative for congestion and sore throat.   Eyes:  Negative for double  vision.  Respiratory:  Negative for cough, sputum production and shortness of breath.   Cardiovascular:  Negative for chest pain, palpitations and leg swelling.  Gastrointestinal:  Negative for abdominal pain, heartburn and nausea.  Genitourinary:  Negative for dysuria, frequency and hematuria.  Musculoskeletal:  Negative for falls and myalgias.  Neurological:  Negative for dizziness, sensory change and focal weakness.    Past Medical History:  Diagnosis Date   ACUTE URIS OF UNSPECIFIED SITE 11/26/2008   ALLERGIC ARTHRITIS OTHER SPECIFIED SITES 10/22/2010   OA + RA   ALLERGIC RHINITIS 10/28/2007   ARTHRITIS, KNEES, BILATERAL 10/28/2007   BACK PAIN 04/12/2009   BUNIONECTOMY, HX OF 10/28/2007   CELLULITIS&ABSCESS OF HAND EXCEPT FINGERS&THUMB 10/23/2010   Complication of anesthesia    itching and makes me crazy   Family history of anesthesia complication    DAUGHTER HAD HIVES AND " WENT WILD "   HYPERLIPIDEMIA 10/28/2007   HYPERTENSION 10/28/2007   INSOMNIA UNSPECIFIED 10/31/2007   MYOSITIS 10/22/2010   NECK PAIN 12/03/2008   Pain in joint, site unspecified 10/07/2010   Stroke (HCC) 06/23/2023   WRIST PAIN, LEFT 10/22/2010   Past Surgical History:  Procedure Laterality Date   arthroscopic surgery and rotator cuff repair left shoulder     arthroscopy right knee hx of surgery     BUNIONECTOMY     hx of   FOOT SURGERY     RIGHT   hand surgery for broken finger, remote  JOINT REPLACEMENT Right    knee   TOTAL KNEE ARTHROPLASTY  07/01/2012   Procedure: TOTAL KNEE ARTHROPLASTY;  Surgeon: Thera Flake., MD;  Location: [facility] OR;  Service: Orthopedics;  Laterality: Right;  right total knee arthroplasty   TOTAL KNEE ARTHROPLASTY Left 06/13/2014   dr Eulah Pont   TOTAL KNEE ARTHROPLASTY Left 06/13/2014   Procedure: TOTAL KNEE ARTHROPLASTY;  Surgeon: Loreta Ave, MD;  [facility] OR;  Service: Orthopedics;  Laterality: Left;   Social History:   reports that she has never smoked.  She has never used smokeless tobacco. She reports that she does not drink alcohol and does not use drugs.  Family History  Problem Relation Age of Onset   Heart attack Father    Cancer Sister        Lung, stomach   Cancer Brother        lung cancer   Coronary artery disease Other    Heart attack Other    High blood pressure Son    High blood pressure Son    High blood pressure Son    High blood pressure Son    High blood pressure Daughter     Medications: Patient's Medications  New Prescriptions   No medications on file  Previous Medications   ACETAMINOPHEN (TYLENOL) 650 MG CR TABLET    Take 650 mg by mouth every 8 (eight) hours as needed for pain.   AMLODIPINE (NORVASC) 10 MG TABLET    Take 1 tablet (10 mg total) by mouth daily.   ATENOLOL (TENORMIN) 100 MG TABLET    Take 1 tablet (100 mg total) by mouth daily.   ATORVASTATIN (LIPITOR) 40 MG TABLET    Take 1 tablet (40 mg total) by mouth daily.   B COMPLEX VITAMINS TABLET    Take 1 tablet by mouth daily.   CHOLECALCIFEROL (VITAMIN D) 2000 UNITS TABLET    Take 2,000 Units by mouth daily.   CLOPIDOGREL (PLAVIX) 75 MG TABLET    TAKE 1 TABLET EVERY DAY   CYANOCOBALAMIN (VITAMIN B12) 1000 MCG TABLET    Take 1,000 mcg by mouth daily.   DEXTROMETHORPHAN-GUAIFENESIN (MUCINEX DM) 30-600 MG 12HR TABLET    Take 1 tablet by mouth 2 (two) times daily.   DORZOLAMIDE-TIMOLOL (COSOPT) 22.3-6.8 MG/ML OPHTHALMIC SOLUTION    Place 1 drop into both eyes 2 (two) times daily.   FEXOFENADINE (ALLEGRA) 180 MG TABLET    Take 180 mg by mouth daily.   HYDROXYCHLOROQUINE (PLAQUENIL) 200 MG TABLET    Take 200 mg by mouth 2 (two) times daily.   LATANOPROST (XALATAN) 0.005 % OPHTHALMIC SOLUTION    Place 1 drop into both eyes at bedtime.   NETARSUDIL DIMESYLATE (RHOPRESSA) 0.02 % SOLN    Place 1 drop into the right eye at bedtime.   POLYETHYLENE GLYCOL POWDER (GLYCOLAX/MIRALAX) 17 GM/SCOOP POWDER    Take 1 Container by mouth once.   PROMETHAZINE (PHENERGAN)  12.5 MG TABLET    TAKE 1 TABLET BY MOUTH EVERY 6 HOURS AS NEEDED FOR NAUSEA OR VOMITING   TRAMADOL (ULTRAM) 50 MG TABLET    TAKE 1 TABLET EVERY 12 HOURS AS NEEDED FOR MODERATE PAIN  Modified Medications   No medications on file  Discontinued Medications   No medications on file    Physical Exam:  Vitals:   08/11/23 1304  BP: 128/76  Pulse: 62  Resp: 18  Temp: (!) 97.5 F (36.4 C)  SpO2: 97%  Weight: 154 lb 6.4 oz (  70 kg)  Height: 5\' 4"  (1.626 m)   Body mass index is 26.5 kg/m. Wt Readings from Last 3 Encounters:  08/11/23 154 lb 6.4 oz (70 kg)  07/27/23 157 lb (71.2 kg)  06/23/23 157 lb 6.4 oz (71.4 kg)    Physical Exam Constitutional:      Appearance: Normal appearance.  HENT:     Head: Normocephalic and atraumatic.  Cardiovascular:     Rate and Rhythm: Normal rate and regular rhythm.     Heart sounds: No murmur heard. Pulmonary:     Effort: Pulmonary effort is normal. No respiratory distress.     Breath sounds: Normal breath sounds. No wheezing.  Abdominal:     General: Bowel sounds are normal. There is no distension.     Tenderness: There is no abdominal tenderness. There is no guarding or rebound.  Musculoskeletal:        General: No swelling or tenderness.  Skin:    General: Skin is dry.  Neurological:     Mental Status: She is alert. Mental status is at baseline.     Sensory: No sensory deficit.     Motor: No weakness.     Labs reviewed: Basic Metabolic Panel: Recent Labs    01/07/23 1031 06/23/23 1321 06/23/23 2156  NA 140 140 140  K 3.8 3.8 3.6  CL 104 104 104  CO2 30 28 25   GLUCOSE 118* 119* 118*  BUN 17 14 7*  CREATININE 0.Briana 0.93 0.84  CALCIUM 9.6 9.4 9.4  MG  --   --  1.9  PHOS  --   --  3.5  TSH 2.12  --  1.440   Liver Function Tests: Recent Labs    01/07/23 1031 06/23/23 2156  AST 30 26  ALT 41* 25  ALKPHOS 115 114  BILITOT 0.9 0.9  PROT 7.5 8.0  ALBUMIN 4.3 3.8   Recent Labs    06/23/23 2156  LIPASE 28   No  results for input(s): "AMMONIA" in the last 8760 hours. CBC: Recent Labs    01/07/23 1031 06/23/23 1321  WBC 7.0 9.0  NEUTROABS 4.7  --   HGB 13.2 12.8  HCT 39.5 37.7  MCV Briana.8 89.3  PLT 175.0 183   Lipid Panel: Recent Labs    01/07/23 1031 06/24/23 0726  CHOL 192 176  HDL 119.40 100  LDLCALC 63 66  TRIG 48.0 52  CHOLHDL 2 1.8   TSH: Recent Labs    01/07/23 1031 06/23/23 2156  TSH 2.12 1.440   A1C: Lab Results  Component Value Date   HGBA1C 6.3 (H) 06/23/2023     Assessment/Plan  1. Cerebrovascular accident (CVA) due to thrombosis of precerebral artery (HCC) Cont with PT/ OT  Cont with plavix - clopidogrel (PLAVIX) 75 MG tablet; Take 1 tablet (75 mg total) by mouth daily.  Dispense: 90 tablet; Refill: 3 - atorvastatin (LIPITOR) 40 MG tablet; Take 1 tablet (40 mg total) by mouth daily.  Dispense: 90 tablet; Refill: 3  2. Hyperlipidemia with target LDL less than 130 Cont with lipitor  - atorvastatin (LIPITOR) 40 MG tablet; Take 1 tablet (40 mg total) by mouth daily.  Dispense: 90 tablet; Refill: 3  3. Nausea Denies acid reflux Requesting refill on phenergan   4. Primary hypertension At goal 128/ 76  5. Rheumatoid arthritis, involving unspecified site, unspecified whether rheumatoid factor present (HCC) Follow up with rheumatology  Cont with plaquenil  Pain contract initiated  Instructed patient to avoid tramadol and  take extra strength tylenol as needed for pain   Other orders - polyethylene glycol powder (GLYCOLAX/MIRALAX) 17 GM/SCOOP powder; Take 1 Container by mouth once. - promethazine (PHENERGAN) 12.5 MG tablet; Take 1 tablet (12.5 mg total) by mouth every 6 (six) hours as needed.  Dispense: 30 tablet; Refill: 1   No follow-ups on file.:  3months

## 2023-08-12 DIAGNOSIS — H53462 Homonymous bilateral field defects, left side: Secondary | ICD-10-CM | POA: Diagnosis not present

## 2023-08-12 DIAGNOSIS — I129 Hypertensive chronic kidney disease with stage 1 through stage 4 chronic kidney disease, or unspecified chronic kidney disease: Secondary | ICD-10-CM | POA: Diagnosis not present

## 2023-08-12 DIAGNOSIS — M069 Rheumatoid arthritis, unspecified: Secondary | ICD-10-CM | POA: Diagnosis not present

## 2023-08-12 DIAGNOSIS — I69354 Hemiplegia and hemiparesis following cerebral infarction affecting left non-dominant side: Secondary | ICD-10-CM | POA: Diagnosis not present

## 2023-08-12 DIAGNOSIS — I69398 Other sequelae of cerebral infarction: Secondary | ICD-10-CM | POA: Diagnosis not present

## 2023-08-12 DIAGNOSIS — N1832 Chronic kidney disease, stage 3b: Secondary | ICD-10-CM | POA: Diagnosis not present

## 2023-08-12 DIAGNOSIS — E1122 Type 2 diabetes mellitus with diabetic chronic kidney disease: Secondary | ICD-10-CM | POA: Diagnosis not present

## 2023-08-12 DIAGNOSIS — M1389 Other specified arthritis, multiple sites: Secondary | ICD-10-CM | POA: Diagnosis not present

## 2023-08-12 DIAGNOSIS — R531 Weakness: Secondary | ICD-10-CM | POA: Diagnosis not present

## 2023-08-17 DIAGNOSIS — M1389 Other specified arthritis, multiple sites: Secondary | ICD-10-CM | POA: Diagnosis not present

## 2023-08-17 DIAGNOSIS — I69354 Hemiplegia and hemiparesis following cerebral infarction affecting left non-dominant side: Secondary | ICD-10-CM | POA: Diagnosis not present

## 2023-08-17 DIAGNOSIS — I129 Hypertensive chronic kidney disease with stage 1 through stage 4 chronic kidney disease, or unspecified chronic kidney disease: Secondary | ICD-10-CM | POA: Diagnosis not present

## 2023-08-17 DIAGNOSIS — M069 Rheumatoid arthritis, unspecified: Secondary | ICD-10-CM | POA: Diagnosis not present

## 2023-08-17 DIAGNOSIS — N1832 Chronic kidney disease, stage 3b: Secondary | ICD-10-CM | POA: Diagnosis not present

## 2023-08-17 DIAGNOSIS — E1122 Type 2 diabetes mellitus with diabetic chronic kidney disease: Secondary | ICD-10-CM | POA: Diagnosis not present

## 2023-08-17 DIAGNOSIS — I69398 Other sequelae of cerebral infarction: Secondary | ICD-10-CM | POA: Diagnosis not present

## 2023-08-17 DIAGNOSIS — H53462 Homonymous bilateral field defects, left side: Secondary | ICD-10-CM | POA: Diagnosis not present

## 2023-08-17 DIAGNOSIS — R531 Weakness: Secondary | ICD-10-CM | POA: Diagnosis not present

## 2023-08-19 ENCOUNTER — Ambulatory Visit: Payer: Self-pay

## 2023-08-19 DIAGNOSIS — I69354 Hemiplegia and hemiparesis following cerebral infarction affecting left non-dominant side: Secondary | ICD-10-CM | POA: Diagnosis not present

## 2023-08-19 DIAGNOSIS — M1389 Other specified arthritis, multiple sites: Secondary | ICD-10-CM | POA: Diagnosis not present

## 2023-08-19 DIAGNOSIS — M069 Rheumatoid arthritis, unspecified: Secondary | ICD-10-CM | POA: Diagnosis not present

## 2023-08-19 DIAGNOSIS — R531 Weakness: Secondary | ICD-10-CM | POA: Diagnosis not present

## 2023-08-19 DIAGNOSIS — N1832 Chronic kidney disease, stage 3b: Secondary | ICD-10-CM | POA: Diagnosis not present

## 2023-08-19 DIAGNOSIS — E1122 Type 2 diabetes mellitus with diabetic chronic kidney disease: Secondary | ICD-10-CM | POA: Diagnosis not present

## 2023-08-19 DIAGNOSIS — I69398 Other sequelae of cerebral infarction: Secondary | ICD-10-CM | POA: Diagnosis not present

## 2023-08-19 DIAGNOSIS — H53462 Homonymous bilateral field defects, left side: Secondary | ICD-10-CM | POA: Diagnosis not present

## 2023-08-19 DIAGNOSIS — I129 Hypertensive chronic kidney disease with stage 1 through stage 4 chronic kidney disease, or unspecified chronic kidney disease: Secondary | ICD-10-CM | POA: Diagnosis not present

## 2023-08-19 NOTE — Patient Outreach (Signed)
  Care Coordination   Follow Up Visit Note   08/19/2023 Name: Briana [name] MRN: [id number] DOB: 1931/03/05  Briana [name] is a 89 y.o. year old female who sees Briana Sheffield, MD for primary care. I spoke with  Briana [name] by phone today.  What matters to the patients health and wellness today?  RNCM spoke with patient who reports she is doing well. She continues with home health therapy. She reports continues to have supportive family. Daughter is assisting with medication management. Briana [name] has changed primary care providers. RNCM contacted patient's daughter to discuss if any additional case management needs-unable to reach. RNCM will update RNCM assigned to [facility].  Goals Addressed             This Visit's Progress    Assist with health management post hsopitalization       Interventions Today    Flowsheet Row Most Recent Value  Chronic Disease   Chronic disease during today's visit Other  [stroke- admit (06/23/23-06/25/23)]  General Interventions   General Interventions Discussed/Reviewed General Interventions Reviewed  [Evaluation of current treatment plan for health condition and patient's adherence to plan.]  Exercise Interventions   Exercise Discussed/Reviewed Exercise Discussed  Education Interventions   Education Provided Provided Education  Provided Verbal Education On When to see the doctor, Other  [advised continue to take medications as prescribed. attend provider visits as scheduled, work with home health therapist as recommended]  Pharmacy Interventions   Pharmacy Dicussed/Reviewed Pharmacy Topics Discussed  [assess medication management needs-patient reports daughter is assisting patient with medication management]  Safety Interventions   Safety Discussed/Reviewed Safety Discussed, Safety Reviewed, Fall Risk, Home Safety  Home Safety Assistive Devices  [fall prevention strategies discussed. encouraged use of assistive device as  recommended]            SDOH assessments and interventions completed:  No  Care Coordination Interventions:  Yes, provided   Follow up plan:  RNCM will transition to [facility] assigned to [facility]    Encounter Outcome:  Patient Visit Completed   Briana Sheriff, RN, MSN, BSN, CCM Care Management Coordinator [phone]

## 2023-08-19 NOTE — Patient Instructions (Addendum)
Visit Information  Thank you for taking time to visit with me today. Please don't hesitate to contact me if I can be of assistance to you.   Following are the goals we discussed today:  Continue to take medications as prescribed. Continue to attend provider visits as scheduled Continue to eat healthy, lean meats, vegetables, fruits, avoid saturated and transfats Contact provider with health questions or concerns as needed Continue to work with Home health therapist   If you are experiencing a Mental Health or Behavioral Health Crisis or need someone to talk to, please call the Suicide and Crisis Lifeline: 988 call the [place] National Suicide Prevention Lifeline: 587-437-9943 or TTY: 9493985877 TTY 469-539-8252) to talk to a trained counselor call 1-800-273-TALK (toll free, 24 hour hotline)  Follow up: RNCM will transfer to [facility] with [place] [facility]  Kathyrn Sheriff, RN, MSN, BSN, CCM Care Management Coordinator [phone]

## 2023-08-23 DIAGNOSIS — N1832 Chronic kidney disease, stage 3b: Secondary | ICD-10-CM | POA: Diagnosis not present

## 2023-08-23 DIAGNOSIS — H53462 Homonymous bilateral field defects, left side: Secondary | ICD-10-CM | POA: Diagnosis not present

## 2023-08-23 DIAGNOSIS — E1122 Type 2 diabetes mellitus with diabetic chronic kidney disease: Secondary | ICD-10-CM | POA: Diagnosis not present

## 2023-08-23 DIAGNOSIS — I69354 Hemiplegia and hemiparesis following cerebral infarction affecting left non-dominant side: Secondary | ICD-10-CM | POA: Diagnosis not present

## 2023-08-23 DIAGNOSIS — I69398 Other sequelae of cerebral infarction: Secondary | ICD-10-CM | POA: Diagnosis not present

## 2023-08-23 DIAGNOSIS — R531 Weakness: Secondary | ICD-10-CM | POA: Diagnosis not present

## 2023-08-23 DIAGNOSIS — M069 Rheumatoid arthritis, unspecified: Secondary | ICD-10-CM | POA: Diagnosis not present

## 2023-08-23 DIAGNOSIS — I129 Hypertensive chronic kidney disease with stage 1 through stage 4 chronic kidney disease, or unspecified chronic kidney disease: Secondary | ICD-10-CM | POA: Diagnosis not present

## 2023-08-23 DIAGNOSIS — M1389 Other specified arthritis, multiple sites: Secondary | ICD-10-CM | POA: Diagnosis not present

## 2023-09-01 DIAGNOSIS — I639 Cerebral infarction, unspecified: Secondary | ICD-10-CM | POA: Diagnosis not present

## 2023-09-01 DIAGNOSIS — H10413 Chronic giant papillary conjunctivitis, bilateral: Secondary | ICD-10-CM | POA: Diagnosis not present

## 2023-09-01 DIAGNOSIS — Z961 Presence of intraocular lens: Secondary | ICD-10-CM | POA: Diagnosis not present

## 2023-09-01 DIAGNOSIS — H43813 Vitreous degeneration, bilateral: Secondary | ICD-10-CM | POA: Diagnosis not present

## 2023-09-01 DIAGNOSIS — H04123 Dry eye syndrome of bilateral lacrimal glands: Secondary | ICD-10-CM | POA: Diagnosis not present

## 2023-09-01 DIAGNOSIS — H26493 Other secondary cataract, bilateral: Secondary | ICD-10-CM | POA: Diagnosis not present

## 2023-09-01 DIAGNOSIS — H401113 Primary open-angle glaucoma, right eye, severe stage: Secondary | ICD-10-CM | POA: Diagnosis not present

## 2023-09-07 ENCOUNTER — Ambulatory Visit: Payer: Medicare HMO | Attending: Neurology

## 2023-09-07 DIAGNOSIS — I639 Cerebral infarction, unspecified: Secondary | ICD-10-CM

## 2023-09-15 ENCOUNTER — Encounter: Payer: Self-pay | Admitting: Sports Medicine

## 2023-09-15 ENCOUNTER — Ambulatory Visit
Admission: RE | Admit: 2023-09-15 | Discharge: 2023-09-15 | Disposition: A | Discharge status: Home / Self Care | Payer: Medicare HMO | Source: Ambulatory Visit | Attending: Sports Medicine

## 2023-09-15 ENCOUNTER — Ambulatory Visit (INDEPENDENT_AMBULATORY_CARE_PROVIDER_SITE_OTHER): Payer: Medicare HMO | Admitting: Sports Medicine

## 2023-09-15 DIAGNOSIS — R112 Nausea with vomiting, unspecified: Secondary | ICD-10-CM

## 2023-09-15 DIAGNOSIS — R059 Cough, unspecified: Secondary | ICD-10-CM

## 2023-09-15 DIAGNOSIS — I517 Cardiomegaly: Secondary | ICD-10-CM | POA: Diagnosis not present

## 2023-09-15 DIAGNOSIS — R0602 Shortness of breath: Secondary | ICD-10-CM | POA: Diagnosis not present

## 2023-09-15 LAB — POCT INFLUENZA A/B
Influenza A, POC: NEGATIVE
Influenza B, POC: NEGATIVE

## 2023-09-15 LAB — POC COVID19 BINAXNOW: SARS Coronavirus 2 Ag: NEGATIVE

## 2023-09-15 MED ORDER — BENZONATATE 200 MG PO CAPS: 200.0000 mg | ORAL_CAPSULE | Freq: Two times a day (BID) | ORAL | 0 refills | Status: DC | PRN

## 2023-09-15 MED ORDER — PANTOPRAZOLE SODIUM 40 MG PO TBEC: 40.0000 mg | DELAYED_RELEASE_TABLET | Freq: Two times a day (BID) | ORAL | 3 refills | Status: DC

## 2023-09-15 MED ORDER — ONDANSETRON HCL 4 MG PO TABS: 4.0000 mg | ORAL_TABLET | Freq: Three times a day (TID) | ORAL | 0 refills | Status: DC | PRN

## 2023-09-15 MED ORDER — DOXYCYCLINE HYCLATE 100 MG PO TABS: 100.0000 mg | ORAL_TABLET | Freq: Two times a day (BID) | ORAL | 0 refills | Status: DC

## 2023-09-15 NOTE — Progress Notes (Signed)
Careteam: Patient Care Team: Briana Sheffield, MD as PCP - General (Internal Medicine) Briana Jordan, MD as Consulting Physician (Rheumatology) Briana Rutherford, MD as Consulting Physician (Ophthalmology) Briana Boop, MD as Consulting Physician (Gastroenterology) Briana [name], [facility] (Inactive) as Pharmacist (Pharmacist) Briana Salvo, NP as Nurse Practitioner (Neurology)  PLACE OF SERVICE:  [facility]  Advanced Directive information Does Patient Have a Medical Advance Directive?: No, Would patient like information on creating a medical advance directive?: No - Patient declined  Allergies  Allergen Reactions   Ibuprofen Anaphylaxis   Percocet [Oxycodone-Acetaminophen]     nausea   Simvastatin Other (See Comments)    muscle aches   2,4-D Dimethylamine Other (See Comments)   Diltiazem Hcl     REACTION: ANGIO EDEMA   Hydrochlorothiazide     REACTION: SWELLING   Lisinopril     REACTION: SWELLING   Valacyclovir Other (See Comments)   Verapamil     REACTION: SWELLING    Chief Complaint  Patient presents with   Acute Visit    Patient complains of head congestion, nausea, coughing, and vomiting.      HPI: Patient is a 89 y.o. female  presented to clinic for acute visit for cough and congestion  Duration- 2-3 days Runny nose - no Cough- yes Phlegm- yes, clear white Sore throat- no Sinus congestion- yes,   Sinus pain- no Myalgias- no Sick contacts- no Headache- no  Medications tried -  mucinex Denies fevers,  SOB Pt able to speak in full sentences   Nausea C/o feels nausea She took her meds on empty stomach and threw up  Denies abdominal pain , loose stools, diarrhea Denies acid reflux Denies urinary symptoms Reports good appetite Last bm this morning   Review of Systems:  Review of Systems  Constitutional:  Negative for chills and fever.  HENT:  Positive for congestion. Negative for ear pain, sinus pain and sore throat.   Eyes:  Negative  for double vision.  Respiratory:  Positive for cough, sputum production and shortness of breath.   Cardiovascular:  Negative for chest pain, palpitations and leg swelling.  Gastrointestinal:  Positive for nausea and vomiting. Negative for abdominal pain and heartburn.  Genitourinary:  Negative for dysuria, frequency and hematuria.  Musculoskeletal:  Negative for falls and myalgias.  Neurological:  Negative for dizziness, sensory change and focal weakness.   Negative unless indicated in HPI.   Past Medical History:  Diagnosis Date   ACUTE URIS OF UNSPECIFIED SITE 11/26/2008   ALLERGIC ARTHRITIS OTHER SPECIFIED SITES 10/22/2010   OA + RA   ALLERGIC RHINITIS 10/28/2007   ARTHRITIS, KNEES, BILATERAL 10/28/2007   BACK PAIN 04/12/2009   BUNIONECTOMY, HX OF 10/28/2007   CELLULITIS&ABSCESS OF HAND EXCEPT FINGERS&THUMB 10/23/2010   Complication of anesthesia    itching and makes me crazy   Family history of anesthesia complication    DAUGHTER HAD HIVES AND " WENT WILD "   HYPERLIPIDEMIA 10/28/2007   HYPERTENSION 10/28/2007   INSOMNIA UNSPECIFIED 10/31/2007   MYOSITIS 10/22/2010   NECK PAIN 12/03/2008   Pain in joint, site unspecified 10/07/2010   Stroke (HCC) 06/23/2023   WRIST PAIN, LEFT 10/22/2010   Past Surgical History:  Procedure Laterality Date   arthroscopic surgery and rotator cuff repair left shoulder     arthroscopy right knee hx of surgery     BUNIONECTOMY     hx of   FOOT SURGERY     RIGHT   hand surgery for broken finger,  remote     JOINT REPLACEMENT Right    knee   TOTAL KNEE ARTHROPLASTY  07/01/2012   Procedure: TOTAL KNEE ARTHROPLASTY;  Surgeon: Thera Flake., MD;  Location: [facility] OR;  Service: Orthopedics;  Laterality: Right;  right total knee arthroplasty   TOTAL KNEE ARTHROPLASTY Left 06/13/2014   dr Eulah Pont   TOTAL KNEE ARTHROPLASTY Left 06/13/2014   Procedure: TOTAL KNEE ARTHROPLASTY;  Surgeon: Loreta Ave, MD;  Location: [facility] OR;  Service: Orthopedics;   Laterality: Left;   Social History:   reports that she has never smoked. She has never used smokeless tobacco. She reports that she does not drink alcohol and does not use drugs.  Family History  Problem Relation Age of Onset   Heart attack Father    Cancer Sister        Lung, stomach   Cancer Brother        lung cancer   Coronary artery disease Other    Heart attack Other    High blood pressure Son    High blood pressure Son    High blood pressure Son    High blood pressure Son    High blood pressure Daughter     Medications: Patient's Medications  New Prescriptions   No medications on file  Previous Medications   ACETAMINOPHEN (TYLENOL) 650 MG CR TABLET    Take 650 mg by mouth every 8 (eight) hours as needed for pain.   AMLODIPINE (NORVASC) 10 MG TABLET    Take 1 tablet (10 mg total) by mouth daily.   ATENOLOL (TENORMIN) 100 MG TABLET    Take 1 tablet (100 mg total) by mouth daily.   ATORVASTATIN (LIPITOR) 40 MG TABLET    Take 1 tablet (40 mg total) by mouth daily.   B COMPLEX VITAMINS TABLET    Take 1 tablet by mouth daily.   CHOLECALCIFEROL (VITAMIN D) 2000 UNITS TABLET    Take 2,000 Units by mouth daily.   CLOPIDOGREL (PLAVIX) 75 MG TABLET    Take 1 tablet (75 mg total) by mouth daily.   CYANOCOBALAMIN (VITAMIN B12) 1000 MCG TABLET    Take 1,000 mcg by mouth daily.   DEXTROMETHORPHAN-GUAIFENESIN (MUCINEX DM) 30-600 MG 12HR TABLET    Take 1 tablet by mouth 2 (two) times daily.   DORZOLAMIDE-TIMOLOL (COSOPT) 22.3-6.8 MG/ML OPHTHALMIC SOLUTION    Place 1 drop into both eyes 2 (two) times daily.   FEXOFENADINE (ALLEGRA) 180 MG TABLET    Take 180 mg by mouth daily.   HYDROXYCHLOROQUINE (PLAQUENIL) 200 MG TABLET    Take 200 mg by mouth 2 (two) times daily.   LATANOPROST (XALATAN) 0.005 % OPHTHALMIC SOLUTION    Place 1 drop into both eyes at bedtime.   NETARSUDIL DIMESYLATE (RHOPRESSA) 0.02 % SOLN    Place 1 drop into the right eye at bedtime.   POLYETHYLENE GLYCOL POWDER  (GLYCOLAX/MIRALAX) 17 GM/SCOOP POWDER    Take 1 Container by mouth once.   PROMETHAZINE (PHENERGAN) 12.5 MG TABLET    Take 1 tablet (12.5 mg total) by mouth every 6 (six) hours as needed.   TRAMADOL (ULTRAM) 50 MG TABLET    TAKE 1 TABLET EVERY 12 HOURS AS NEEDED FOR MODERATE PAIN  Modified Medications   No medications on file  Discontinued Medications   No medications on file    Physical Exam: Vitals:   09/15/23 0955  BP: 124/78  Pulse: 69  Resp: 16  Temp: (!) 95.3 F (35.2 C)  SpO2: 93%  Weight: 154 lb 12.8 oz (70.2 kg)  Height: 5\' 4"  (1.626 m)   Body mass index is 26.57 kg/m. BP Readings from Last 3 Encounters:  09/15/23 124/78  08/11/23 128/76  07/27/23 (!) 144/72   Wt Readings from Last 3 Encounters:  09/15/23 154 lb 12.8 oz (70.2 kg)  08/11/23 154 lb 6.4 oz (70 kg)  07/27/23 157 lb (71.2 kg)    Physical Exam Constitutional:      Appearance: Normal appearance.  HENT:     Head: Normocephalic and atraumatic.     Mouth/Throat:     Pharynx: Posterior oropharyngeal erythema present. No oropharyngeal exudate.  Cardiovascular:     Rate and Rhythm: Normal rate and regular rhythm.  Pulmonary:     Effort: Pulmonary effort is normal. No respiratory distress.     Breath sounds: Normal breath sounds. No wheezing.     Comments: Coarse breath sounds Abdominal:     General: Bowel sounds are normal. There is no distension.     Tenderness: There is no abdominal tenderness. There is no guarding or rebound.     Comments:    Musculoskeletal:        General: No swelling or tenderness.  Neurological:     Mental Status: She is alert. Mental status is at baseline.     Sensory: No sensory deficit.     Motor: No weakness.     Labs reviewed: Basic Metabolic Panel: Recent Labs    01/07/23 1031 06/23/23 1321 06/23/23 2156  NA 140 140 140  K 3.8 3.8 3.6  CL 104 104 104  CO2 30 28 25   GLUCOSE 118* 119* 118*  BUN 17 14 7*  CREATININE 0.92 0.93 0.84  CALCIUM 9.6 9.4 9.4   MG  --   --  1.9  PHOS  --   --  3.5  TSH 2.12  --  1.440   Liver Function Tests: Recent Labs    01/07/23 1031 06/23/23 2156  AST 30 26  ALT 41* 25  ALKPHOS 115 114  BILITOT 0.9 0.9  PROT 7.5 8.0  ALBUMIN 4.3 3.8   Recent Labs    06/23/23 2156  LIPASE 28   No results for input(s): "AMMONIA" in the last 8760 hours. CBC: Recent Labs    01/07/23 1031 06/23/23 1321  WBC 7.0 9.0  NEUTROABS 4.7  --   HGB 13.2 12.8  HCT 39.5 37.7  MCV 92.8 89.3  PLT 175.0 183   Lipid Panel: Recent Labs    01/07/23 1031 06/24/23 0726  CHOL 192 176  HDL 119.40 100  LDLCALC 63 66  TRIG 48.0 52  CHOLHDL 2 1.8   TSH: Recent Labs    01/07/23 1031 06/23/23 2156  TSH 2.12 1.440   A1C: Lab Results  Component Value Date   HGBA1C 6.3 (H) 06/23/2023     Assessment/Plan 1. Nausea and vomiting, unspecified vomiting type Pt denies abdominal pain  Non tender on exam, BS+ Will check cmp , lipase - Complete Metabolic Panel with eGFR - Lipase - ondansetron (ZOFRAN) 4 MG tablet; Take 1 tablet (4 mg total) by mouth every 8 (eight) hours as needed for nausea or vomiting.  Dispense: 20 tablet; Refill: 0 - pantoprazole (PROTONIX) 40 MG tablet; Take 1 tablet (40 mg total) by mouth 2 (two) times daily.  Dispense: 60 tablet; Refill: 3  2. Cough, unspecified type  Flu, covid test negative - DG Chest 2 View; Future - benzonatate (TESSALON) 200 MG capsule; Take  1 capsule (200 mg total) by mouth 2 (two) times daily as needed for cough.  Dispense: 20 capsule; Refill: 0 - doxycycline (VIBRA-TABS) 100 MG tablet; Take 1 tablet (100 mg total) by mouth 2 (two) times daily.  Dispense: 10 tablet; Refill: 0       No follow-ups on file.:

## 2023-09-16 ENCOUNTER — Other Ambulatory Visit: Payer: Self-pay | Admitting: Internal Medicine

## 2023-09-16 ENCOUNTER — Telehealth: Payer: Self-pay | Admitting: Nurse Practitioner

## 2023-09-16 LAB — COMPLETE METABOLIC PANEL WITH GFR
AG Ratio: 1.1 (calc) (ref 1.0–2.5)
ALT: 30 U/L — ABNORMAL HIGH (ref 6–29)
AST: 25 U/L (ref 10–35)
Albumin: 4 g/dL (ref 3.6–5.1)
Alkaline phosphatase (APISO): 117 U/L (ref 37–153)
BUN/Creatinine Ratio: 14 (calc) (ref 6–22)
BUN: 15 mg/dL (ref 7–25)
CO2: 29 mmol/L (ref 20–32)
Calcium: 9.7 mg/dL (ref 8.6–10.4)
Chloride: 104 mmol/L (ref 98–110)
Creat: 1.04 mg/dL — ABNORMAL HIGH (ref 0.60–0.95)
Globulin: 3.5 g/dL (ref 1.9–3.7)
Glucose, Bld: 134 mg/dL — ABNORMAL HIGH (ref 65–99)
Potassium: 3.8 mmol/L (ref 3.5–5.3)
Sodium: 143 mmol/L (ref 135–146)
Total Bilirubin: 1.3 mg/dL — ABNORMAL HIGH (ref 0.2–1.2)
Total Protein: 7.5 g/dL (ref 6.1–8.1)
eGFR: 51 mL/min/{1.73_m2} — ABNORMAL LOW (ref >=60)

## 2023-09-16 LAB — LIPASE: Lipase: 22 U/L (ref 7–60)

## 2023-09-16 NOTE — Telephone Encounter (Signed)
Briana [name] DOB  Mar 30, 1931  The patient's daughter Briana [name] Surgeon called and reported [name] is wheezing today in addition to SOB presented yesterday. Doxycycline and Tessalon started 09/15/23.   The patient has no further nausea, vomiting since Protonix started yesterday.   CMP, Lipase results reviewed with the patient and the patient's daughter  CXR result is not available to review  Advise the patient for ED evaluation and treatment if wheezing persists or new symptoms develop.  [name] Surgeon desires to call her #[phone] for her mother's test results and medication condition updates.   Thank you.

## 2023-09-17 ENCOUNTER — Telehealth: Payer: Self-pay | Admitting: Sports Medicine

## 2023-09-17 MED ORDER — ALBUTEROL SULFATE HFA 108 (90 BASE) MCG/ACT IN AERS: 2.0000 | INHALATION_SPRAY | Freq: Four times a day (QID) | RESPIRATORY_TRACT | 0 refills | Status: DC | PRN

## 2023-09-17 NOTE — Telephone Encounter (Signed)
Briana Sheffield, MD  [facility]2 hours ago (1:45 PM)    Plz let the daughter know that  kidney function  got slightly worse Increase oral hydration Chest x ray pending Will send albuterol for wheezing If her breathing worsens she needs to go to ED .    Daughter notified and agreed.

## 2023-09-17 NOTE — Telephone Encounter (Signed)
    Latest Ref Rng & Units 09/15/2023   11:01 AM 06/23/2023    9:56 PM 06/23/2023    1:21 PM  BMP  Glucose 65 - 99 mg/dL 161  096  045   BUN 7 - 25 mg/dL 15  7  14    Creatinine 0.60 - 0.95 mg/dL 4.09  8.11  9.14   BUN/Creat Ratio 6 - 22 (calc) 14     Sodium 135 - 146 mmol/L 143  140  140   Potassium 3.5 - 5.3 mmol/L 3.8  3.6  3.8   Chloride 98 - 110 mmol/L 104  104  104   CO2 20 - 32 mmol/L 29  25  28    Calcium 8.6 - 10.4 mg/dL 9.7  9.4  9.4     Lipase wnl  Chest x ray pending  As per NP note pt is wheezing, will send albuterol prn  If her breathing worsens she needs to go to ED

## 2023-09-19 ENCOUNTER — Emergency Department (HOSPITAL_BASED_OUTPATIENT_CLINIC_OR_DEPARTMENT_OTHER): Payer: Medicare HMO

## 2023-09-19 ENCOUNTER — Encounter (HOSPITAL_BASED_OUTPATIENT_CLINIC_OR_DEPARTMENT_OTHER): Payer: Self-pay | Admitting: Emergency Medicine

## 2023-09-19 ENCOUNTER — Emergency Department (HOSPITAL_BASED_OUTPATIENT_CLINIC_OR_DEPARTMENT_OTHER)
Admission: EM | Admit: 2023-09-19 | Discharge: 2023-09-19 | Disposition: A | Discharge status: Home / Self Care | Payer: Medicare HMO | Attending: Emergency Medicine | Admitting: Emergency Medicine

## 2023-09-19 ENCOUNTER — Other Ambulatory Visit: Payer: Self-pay

## 2023-09-19 DIAGNOSIS — J069 Acute upper respiratory infection, unspecified: Secondary | ICD-10-CM | POA: Insufficient documentation

## 2023-09-19 DIAGNOSIS — Z1152 Encounter for screening for COVID-19: Secondary | ICD-10-CM | POA: Insufficient documentation

## 2023-09-19 DIAGNOSIS — R059 Cough, unspecified: Secondary | ICD-10-CM | POA: Diagnosis not present

## 2023-09-19 DIAGNOSIS — R0602 Shortness of breath: Secondary | ICD-10-CM | POA: Diagnosis not present

## 2023-09-19 DIAGNOSIS — B9789 Other viral agents as the cause of diseases classified elsewhere: Secondary | ICD-10-CM | POA: Diagnosis not present

## 2023-09-19 LAB — BASIC METABOLIC PANEL
Anion gap: 7 (ref 5–15)
BUN: 14 mg/dL (ref 8–23)
CO2: 29 mmol/L (ref 22–32)
Calcium: 9.3 mg/dL (ref 8.9–10.3)
Chloride: 104 mmol/L (ref 98–111)
Creatinine, Ser: 1.07 mg/dL — ABNORMAL HIGH (ref 0.44–1.00)
GFR, Estimated: 49 mL/min — ABNORMAL LOW (ref >60)
Glucose, Bld: 115 mg/dL — ABNORMAL HIGH (ref 70–99)
Potassium: 3.6 mmol/L (ref 3.5–5.1)
Sodium: 140 mmol/L (ref 135–145)

## 2023-09-19 LAB — CBC WITH DIFFERENTIAL/PLATELET
Abs Immature Granulocytes: 0.02 10*3/uL (ref 0.00–0.07)
Basophils Absolute: 0 10*3/uL (ref 0.0–0.1)
Basophils Relative: 1 %
Eosinophils Absolute: 0.2 10*3/uL (ref 0.0–0.5)
Eosinophils Relative: 2 %
HCT: 39.4 % (ref 36.0–46.0)
Hemoglobin: 13.4 g/dL (ref 12.0–15.0)
Immature Granulocytes: 0 %
Lymphocytes Relative: 15 %
Lymphs Abs: 1.3 10*3/uL (ref 0.7–4.0)
MCH: 31.3 pg (ref 26.0–34.0)
MCHC: 34 g/dL (ref 30.0–36.0)
MCV: 92.1 fL (ref 80.0–100.0)
Monocytes Absolute: 0.7 10*3/uL (ref 0.1–1.0)
Monocytes Relative: 8 %
Neutro Abs: 6.5 10*3/uL (ref 1.7–7.7)
Neutrophils Relative %: 74 %
Platelets: 199 10*3/uL (ref 150–400)
RBC: 4.28 MIL/uL (ref 3.87–5.11)
RDW: 14.8 % (ref 11.5–15.5)
WBC: 8.7 10*3/uL (ref 4.0–10.5)
nRBC: 0 % (ref 0.0–0.2)

## 2023-09-19 LAB — RESP PANEL BY RT-PCR (RSV, FLU A&B, COVID)  RVPGX2
Influenza A by PCR: NEGATIVE
Influenza B by PCR: NEGATIVE
Resp Syncytial Virus by PCR: NEGATIVE
SARS Coronavirus 2 by RT PCR: NEGATIVE

## 2023-09-19 NOTE — ED Triage Notes (Signed)
Pt went to doctor on Wednesday, had a chest xray, blood work was negative , she was sent home with albuterol inhaler, doxy, protonix and tessalon pearls. Pt states he is still short of breath.

## 2023-09-19 NOTE — ED Triage Notes (Signed)
Pt in no distress, is on antibiotics.

## 2023-09-19 NOTE — ED Provider Notes (Signed)
[place] EMERGENCY DEPARTMENT AT [facility] Provider Note   CSN: [id number] Arrival date & time: 09/19/23  1144     History  Chief Complaint  Patient presents with   Shortness of Breath    Briana [name] is a 89 y.o. female.  This is a 89 year old female who is here today for cough and congestion.  Patient went to her doctor on Wednesday had chest x-ray, blood work, was discharged with doxycycline, albuterol, Protonix, Zofran.  She says that she has not felt better.   Shortness of Breath      Home Medications Prior to Admission medications   Medication Sig Start Date End Date Taking? Authorizing Provider  acetaminophen (TYLENOL) 650 MG CR tablet Take 650 mg by mouth every 8 (eight) hours as needed for pain.    [provider]  albuterol (VENTOLIN HFA) 108 (90 Base) MCG/ACT inhaler Inhale 2 puffs into the lungs every 6 (six) hours as needed for wheezing or shortness of breath. 09/17/23   Venita Sheffield, MD  amLODipine (NORVASC) 10 MG tablet Take 1 tablet (10 mg total) by mouth daily. 01/07/23   Etta Grandchild, MD  atenolol (TENORMIN) 100 MG tablet Take 1 tablet (100 mg total) by mouth daily. 01/07/23   Etta Grandchild, MD  atorvastatin (LIPITOR) 40 MG tablet Take 1 tablet (40 mg total) by mouth daily. 08/11/23 09/15/23  Venita Sheffield, MD  b complex vitamins tablet Take 1 tablet by mouth daily.    [provider]  benzonatate (TESSALON) 200 MG capsule Take 1 capsule (200 mg total) by mouth 2 (two) times daily as needed for cough. 09/15/23   Venita Sheffield, MD  Cholecalciferol (VITAMIN D) 2000 UNITS tablet Take 2,000 Units by mouth daily.    [provider]  clopidogrel (PLAVIX) 75 MG tablet Take 1 tablet (75 mg total) by mouth daily. 08/11/23   Venita Sheffield, MD  cyanocobalamin (VITAMIN B12) 1000 MCG tablet Take 1,000 mcg by mouth daily.    [provider]  dextromethorphan-guaiFENesin (MUCINEX DM) 30-600  MG 12hr tablet Take 1 tablet by mouth 2 (two) times daily.    [provider]  dorzolamide-timolol (COSOPT) 22.3-6.8 MG/ML ophthalmic solution Place 1 drop into both eyes 2 (two) times daily.    [provider]  doxycycline (VIBRA-TABS) 100 MG tablet Take 1 tablet (100 mg total) by mouth 2 (two) times daily. 09/15/23   Venita Sheffield, MD  fexofenadine (ALLEGRA) 180 MG tablet Take 180 mg by mouth daily.    [provider]  hydroxychloroquine (PLAQUENIL) 200 MG tablet Take 200 mg by mouth 2 (two) times daily.    Zenovia Jordan, MD  latanoprost (XALATAN) 0.005 % ophthalmic solution Place 1 drop into both eyes at bedtime.    [provider]  Netarsudil Dimesylate (RHOPRESSA) 0.02 % SOLN Place 1 drop into the right eye at bedtime.    [provider]  ondansetron (ZOFRAN) 4 MG tablet Take 1 tablet (4 mg total) by mouth every 8 (eight) hours as needed for nausea or vomiting. 09/15/23   Venita Sheffield, MD  pantoprazole (PROTONIX) 40 MG tablet Take 1 tablet (40 mg total) by mouth 2 (two) times daily. 09/15/23   Venita Sheffield, MD  polyethylene glycol powder (GLYCOLAX/MIRALAX) 17 GM/SCOOP powder Take 1 Container by mouth once.    [provider]  traMADol (ULTRAM) 50 MG tablet TAKE 1 TABLET EVERY 12 HOURS AS NEEDED FOR MODERATE PAIN 08/03/23   Etta Grandchild, MD  Allergies    Ibuprofen; Percocet [oxycodone-acetaminophen]; Simvastatin; 2,4-d dimethylamine; Diltiazem hcl; Hydrochlorothiazide; Lisinopril; Valacyclovir; and Verapamil    Review of Systems   Review of Systems  Respiratory:  Positive for shortness of breath.     Physical Exam Updated Vital Signs BP (!) 143/58 (BP Location: Right Arm)   Pulse 63   Temp 98.7 F (37.1 C) (Oral)   Resp 18   SpO2 97%  Physical Exam Vitals reviewed.  Pulmonary:     Effort: Pulmonary effort is normal.     Breath sounds: Normal breath sounds. No decreased breath sounds.  Abdominal:      Palpations: Abdomen is soft.  Skin:    General: Skin is warm and dry.  Neurological:     General: No focal deficit present.     Mental Status: She is alert.     ED Results / Procedures / Treatments   Labs (all labs ordered are listed, but only abnormal results are displayed) Labs Reviewed  BASIC METABOLIC PANEL - Abnormal; Notable for the following components:      Result Value   Glucose, Bld 115 (*)    Creatinine, Ser 1.07 (*)    GFR, Estimated 49 (*)    All other components within normal limits  RESP PANEL BY RT-PCR (RSV, FLU A&B, COVID)  RVPGX2  CBC WITH DIFFERENTIAL/PLATELET    EKG None  Radiology CT Chest Wo Contrast  Result Date: 09/19/2023 CLINICAL DATA:  Shortness of breath. Pneumonia. Complication suspected. EXAM: CT CHEST WITHOUT CONTRAST TECHNIQUE: Multidetector CT imaging of the chest was performed following the standard protocol without IV contrast. RADIATION DOSE REDUCTION: This exam was performed according to the departmental dose-optimization program which includes automated exposure control, adjustment of the mA and/or kV according to patient size and/or use of iterative reconstruction technique. COMPARISON:  09/15/2023 chest radiograph.  No comparison CT. FINDINGS: Cardiovascular: Aortic atherosclerosis. Tortuous descending thoracic aorta. Moderate cardiomegaly with right atrial enlargement. No pericardial effusion. Left main and 3 vessel coronary artery calcification. Enlargement of the pulmonary arteries, including a 2.8 cm right pulmonary artery. Mediastinum/Nodes: Prominent but not pathologically sized middle mediastinal nodes. Hilar regions poorly evaluated without intravenous contrast. Lungs/Pleura: No pleural fluid. Left lower lobe scarring or atelectasis medially. No evidence of pneumonia. Upper Abdomen: Normal imaged portions of the liver, stomach, pancreas, adrenal glands, kidneys. Old granulomatous disease in the spleen. Musculoskeletal: No acute osseous  abnormality. IMPRESSION: 1. No evidence of pneumonia or explanation for shortness of breath. 2. Pulmonary artery enlargement suggests pulmonary arterial hypertension. 3. Cardiomegaly. Coronary artery atherosclerosis. Aortic Atherosclerosis (ICD10-I70.0). Electronically Signed   By: Jeronimo Greaves M.D.   On: 09/19/2023 14:36    Procedures Procedures    Medications Ordered in ED Medications - No data to display  ED Course/ Medical Decision Making/ A&P                                 Medical Decision Making 89 year old female here today for cough and nasal congestion.  Differential diagnoses include viral syndrome, pneumonia, less likely PE, less likely ACS, less likely CHF.  Plan-reviewed the patient's chest x-ray from a few days ago, did not see any consolidation.  Will obtain CT imaging of the patient's chest.  Blood work ordered.  Overall patient looks well, quite healthy for 89.  Has no history of lung disease.  Nontachypneic, normal O2 sat.  Reassessment 2:40 PM-patient CT scan of her chest does not  show any pneumonia.  Likely viral syndrome.  Will discharge patient, have her continue taking her medications.  I reviewed the patient's labs, kidney function little bit diminished, normal for 89 year old.  Considered admission for the patient given advanced age, however vital signs are normal and her imaging and labs are also normal.  Believe patient is appropriate for continued management on an outpatient basis.  Amount and/or Complexity of Data Reviewed Labs: ordered. Radiology: ordered.           Final Clinical Impression(s) / ED Diagnoses Final diagnoses:  Viral URI with cough    Rx / DC Orders ED Discharge Orders     None         Arletha Pili, DO 09/19/23 1445

## 2023-09-19 NOTE — Discharge Instructions (Addendum)
Your CT scan of your chest did not show pneumonia.  You likely have a viral infection.  Your labs are otherwise normal.  We only test for flu, COVID and RSV here in the emergency room.  These tests were negative.  Continue taking your medications that you were prescribed.  Please follow-up with your primary care doctor within 1 week.  Return to the emergency department if you develop increased difficulty with your breathing.

## 2023-09-20 ENCOUNTER — Telehealth: Payer: Self-pay

## 2023-09-20 NOTE — Transitions of Care (Post Inpatient/ED Visit) (Signed)
09/20/2023  Name: Briana [name] MRN: [id number] DOB: 04-22-1931  Today's TOC FU Call Status: Today's TOC FU Call Status:: Successful TOC FU Call Completed TOC FU Call Complete Date: 09/20/23 Patient's Name and Date of Birth confirmed.  Transition Care Management Follow-up Telephone Call Date of Discharge: 09/19/23 Discharge Facility: [facility] ([facility]) Type of Discharge: Emergency Department Reason for ED Visit: Other: (respiratory infection) How have you been since you were released from the hospital?: Better Any questions or concerns?: No  Items Reviewed: Did you receive and understand the discharge instructions provided?: Yes Medications obtained,verified, and reconciled?: Yes (Medications Reviewed) Any new allergies since your discharge?: No Dietary orders reviewed?: Yes Do you have support at home?: Yes People in Home: child(ren), adult  Medications Reviewed Today: Medications Reviewed Today     Reviewed by Karena Addison, LPN (Licensed Practical Nurse) on 09/20/23 at 1706  Med List Status: <None>   Medication Order Taking? Sig Documenting Provider Last Dose Status Informant  acetaminophen (TYLENOL) 650 MG CR tablet [id number] No Take 650 mg by mouth every 8 (eight) hours as needed for pain. [provider] Taking Active Multiple Informants  albuterol (VENTOLIN HFA) 108 (90 Base) MCG/ACT inhaler [id number]  Inhale 2 puffs into the lungs every 6 (six) hours as needed for wheezing or shortness of breath. Venita Sheffield, MD  Active   amLODipine (NORVASC) 10 MG tablet [id number] No Take 1 tablet (10 mg total) by mouth daily. Etta Grandchild, MD Taking Active Multiple Informants  atenolol (TENORMIN) 100 MG tablet [id number] No Take 1 tablet (100 mg total) by mouth daily. Etta Grandchild, MD Taking Active Multiple Informants  atorvastatin (LIPITOR) 40 MG tablet [id number] No Take 1 tablet (40 mg total) by mouth daily. Venita Sheffield, MD Taking  Expired 09/15/23 2359   b complex vitamins tablet [id number] No Take 1 tablet by mouth daily. [provider] Taking Active Multiple Informants  benzonatate (TESSALON) 200 MG capsule [id number]  Take 1 capsule (200 mg total) by mouth 2 (two) times daily as needed for cough. Venita Sheffield, MD  Active   Cholecalciferol (VITAMIN D) 2000 UNITS tablet [id number] No Take 2,000 Units by mouth daily. [provider] Taking Active Multiple Informants           Med Note Normand Sloop, JASMINE E   Wed Sep 15, 2023 10:01 AM)    clopidogrel (PLAVIX) 75 MG tablet [id number] No Take 1 tablet (75 mg total) by mouth daily. Venita Sheffield, MD Taking Active   cyanocobalamin (VITAMIN B12) 1000 MCG tablet [id number] No Take 1,000 mcg by mouth daily. [provider] Taking Active Multiple Informants  dextromethorphan-guaiFENesin (MUCINEX DM) 30-600 MG 12hr tablet [id number] No Take 1 tablet by mouth 2 (two) times daily. [provider] Taking Active Multiple Informants           Med Note Normand Sloop, JASMINE E   Wed Sep 15, 2023 10:01 AM)    dorzolamide-timolol (COSOPT) 22.3-6.8 MG/ML ophthalmic solution [id number] No Place 1 drop into both eyes 2 (two) times daily. [provider] Taking Active Multiple Informants  doxycycline (VIBRA-TABS) 100 MG tablet [id number]  Take 1 tablet (100 mg total) by mouth 2 (two) times daily. Venita Sheffield, MD  Active   fexofenadine (ALLEGRA) 180 MG tablet [id number] No Take 180 mg by mouth daily. [provider] Taking Active Multiple Informants           Med Note Normand Sloop, JASMINE E   Wed Sep 15, 2023 10:01 AM)    hydroxychloroquine (  PLAQUENIL) 200 MG tablet [id number] No Take 200 mg by mouth 2 (two) times daily. Zenovia Jordan, MD Taking Active Multiple Informants  latanoprost (XALATAN) 0.005 % ophthalmic solution [id number] No Place 1 drop into both eyes at bedtime. [provider] Taking Active Multiple Informants   Netarsudil Dimesylate (RHOPRESSA) 0.02 % SOLN [id number] No Place 1 drop into the right eye at bedtime. [provider] Taking Active Multiple Informants  ondansetron (ZOFRAN) 4 MG tablet [id number]  Take 1 tablet (4 mg total) by mouth every 8 (eight) hours as needed for nausea or vomiting. Venita Sheffield, MD  Active   pantoprazole (PROTONIX) 40 MG tablet [id number]  Take 1 tablet (40 mg total) by mouth 2 (two) times daily. Venita Sheffield, MD  Active   polyethylene glycol powder (GLYCOLAX/MIRALAX) 17 GM/SCOOP powder [id number] No Take 1 Container by mouth once. [provider] Taking Active   traMADol (ULTRAM) 50 MG tablet [id number] No TAKE 1 TABLET EVERY 12 HOURS AS NEEDED FOR MODERATE PAIN Etta Grandchild, MD Taking Active             Home Care and Equipment/Supplies: Were Home Health Services Ordered?: NA Any new equipment or medical supplies ordered?: NA  Functional Questionnaire: Do you need assistance with bathing/showering or dressing?: No Do you need assistance with meal preparation?: No Do you need assistance with eating?: No Do you have difficulty maintaining continence: No Do you need assistance with getting out of bed/getting out of a chair/moving?: No Do you have difficulty managing or taking your medications?: No  Follow up appointments reviewed: PCP Follow-up appointment confirmed?: NA Specialist Hospital Follow-up appointment confirmed?: NA Do you need transportation to your follow-up appointment?: No Do you understand care options if your condition(s) worsen?: Yes-patient verbalized understanding    SIGNATURE Karena Addison, LPN [facility] Nurse Health Advisor Direct Dial [phone]

## 2023-09-21 ENCOUNTER — Telehealth: Payer: Self-pay

## 2023-09-21 NOTE — Telephone Encounter (Signed)
Message left on clinical intake voicemail on 09/20/23 @ 4:27 pm:   Patient has a rx on file for atorvastatin 10 mg and 40 mg. [facility] would like a return call to clarify which dose patient is to be on.   Call was returned to [facility] and recording stating to call back during business hours 8 am.

## 2023-09-21 NOTE — Telephone Encounter (Signed)
Another outgoing call placed to [facility], spoke with pharmacist Candise Bowens) and confirmed what we have on patients active medication list.

## 2023-09-22 NOTE — Addendum Note (Signed)
Addended by: Glean Salvo on: 09/22/2023 02:26 PM   Modules accepted: Orders

## 2023-09-23 ENCOUNTER — Telehealth: Payer: Self-pay

## 2023-09-23 NOTE — Telephone Encounter (Signed)
Patients daughter called in to request the patients Cardiology referral be sent to Dr. Marca Ancona with [facility] - [address], [place], [place] [address]

## 2023-09-29 NOTE — Telephone Encounter (Signed)
Call the facility where referral was routed through [facility]. Their office was closed for today. Will call to tomorrow to check if

## 2023-10-09 ENCOUNTER — Other Ambulatory Visit: Payer: Self-pay | Admitting: Sports Medicine

## 2023-10-27 ENCOUNTER — Other Ambulatory Visit: Payer: Self-pay

## 2023-10-27 DIAGNOSIS — I10 Essential (primary) hypertension: Secondary | ICD-10-CM

## 2023-10-27 DIAGNOSIS — R112 Nausea with vomiting, unspecified: Secondary | ICD-10-CM

## 2023-10-27 MED ORDER — ATENOLOL 100 MG PO TABS: 100.0000 mg | ORAL_TABLET | Freq: Every day | ORAL | 1 refills | Status: DC

## 2023-10-27 MED ORDER — PANTOPRAZOLE SODIUM 40 MG PO TBEC: 40.0000 mg | DELAYED_RELEASE_TABLET | Freq: Two times a day (BID) | ORAL | 1 refills | Status: DC

## 2023-10-27 NOTE — Telephone Encounter (Signed)
 Patient medication has warnings. Medication pend and sent to PCP Venita Sheffield, MD

## 2023-10-28 MED ORDER — AMLODIPINE BESYLATE 10 MG PO TABS: 10.0000 mg | ORAL_TABLET | Freq: Every day | ORAL | 1 refills | Status: DC

## 2023-10-29 ENCOUNTER — Other Ambulatory Visit: Payer: Self-pay | Admitting: Sports Medicine

## 2023-10-29 ENCOUNTER — Telehealth: Payer: Self-pay

## 2023-10-29 DIAGNOSIS — R112 Nausea with vomiting, unspecified: Secondary | ICD-10-CM

## 2023-10-29 NOTE — Telephone Encounter (Signed)
Patient with ongoing nausea and vomiting. Patient with 1 vomiting episode within the last 24 hours. Patient denies abdominal pain, fever, or discomfort. After reviewing last OV regarding nausea and vomiting, it was determined that patient never picked up rx's that was prescribed in December: Zofran and Protonix.   Patients daughter plans to pick up now

## 2023-11-01 NOTE — Telephone Encounter (Signed)
RX was allowed a 20 count dispense number in December. Please advise if ok to approve and if so can refills be allowed if this is a long term medication for patient

## 2023-11-05 ENCOUNTER — Other Ambulatory Visit: Payer: Self-pay

## 2023-11-05 DIAGNOSIS — I10 Essential (primary) hypertension: Secondary | ICD-10-CM

## 2023-11-05 DIAGNOSIS — R112 Nausea with vomiting, unspecified: Secondary | ICD-10-CM

## 2023-11-05 MED ORDER — ATENOLOL 100 MG PO TABS: 100.0000 mg | ORAL_TABLET | Freq: Every day | ORAL | 1 refills | Status: DC

## 2023-11-05 MED ORDER — PANTOPRAZOLE SODIUM 40 MG PO TBEC: 40.0000 mg | DELAYED_RELEASE_TABLET | Freq: Two times a day (BID) | ORAL | 1 refills | Status: DC

## 2023-11-05 MED ORDER — AMLODIPINE BESYLATE 10 MG PO TABS: 10.0000 mg | ORAL_TABLET | Freq: Every day | ORAL | 1 refills | Status: DC

## 2023-11-05 NOTE — Telephone Encounter (Signed)
High Risk Warning Populated when attempting to refill, I will send to Provider for further review

## 2023-11-09 ENCOUNTER — Ambulatory Visit: Payer: Medicare HMO | Admitting: Sports Medicine

## 2023-11-09 ENCOUNTER — Encounter: Payer: Self-pay | Admitting: Sports Medicine

## 2023-11-09 VITALS — BP 116/64 | HR 64 | Temp 97.1°F | Resp 16 | Ht 64.0 in | Wt 156.2 lb

## 2023-11-09 DIAGNOSIS — R413 Other amnesia: Secondary | ICD-10-CM

## 2023-11-09 DIAGNOSIS — M069 Rheumatoid arthritis, unspecified: Secondary | ICD-10-CM

## 2023-11-09 DIAGNOSIS — R2689 Other abnormalities of gait and mobility: Secondary | ICD-10-CM

## 2023-11-09 DIAGNOSIS — R21 Rash and other nonspecific skin eruption: Secondary | ICD-10-CM

## 2023-11-09 DIAGNOSIS — I10 Essential (primary) hypertension: Secondary | ICD-10-CM

## 2023-11-09 DIAGNOSIS — I639 Cerebral infarction, unspecified: Secondary | ICD-10-CM | POA: Diagnosis not present

## 2023-11-09 DIAGNOSIS — R110 Nausea: Secondary | ICD-10-CM

## 2023-11-09 DIAGNOSIS — F5101 Primary insomnia: Secondary | ICD-10-CM

## 2023-11-09 MED ORDER — TRIAMCINOLONE ACETONIDE 0.1 % EX CREA: 1.0000 | TOPICAL_CREAM | Freq: Two times a day (BID) | CUTANEOUS | 0 refills | Status: DC

## 2023-11-09 NOTE — Patient Instructions (Addendum)
1.) Please STOP at check out and schedule an annual wellness visit with one of my nurse practitioners (video or in person).

## 2023-11-09 NOTE — Progress Notes (Signed)
Careteam: Patient Care Team: Venita Sheffield, MD as PCP - General (Internal Medicine) Zenovia Jordan, MD as Consulting Physician (Rheumatology) Ernesto Rutherford, MD as Consulting Physician (Ophthalmology) Iva Boop, MD as Consulting Physician (Gastroenterology) Kathyrn Sheriff, [facility] (Inactive) as Pharmacist (Pharmacist) Glean Salvo, NP as Nurse Practitioner (Neurology)  PLACE OF SERVICE:  [facility]  Advanced Directive information    Allergies  Allergen Reactions   Ibuprofen Anaphylaxis   Percocet [Oxycodone-Acetaminophen]     nausea   Simvastatin Other (See Comments)    muscle aches   2,4-D Dimethylamine Other (See Comments)   Diltiazem Hcl     REACTION: ANGIO EDEMA   Hydrochlorothiazide     REACTION: SWELLING   Lisinopril     REACTION: SWELLING   Valacyclovir Other (See Comments)   Verapamil     REACTION: SWELLING    Chief Complaint  Patient presents with   Medical Management of Chronic Issues    3 month follow-up.    Immunizations    Discuss the need for DTAP vaccine, and Covid Booster. NCIR Verified.      Discussed the use of AI scribe software for clinical note transcription with the patient, who gave verbal consent to proceed.  History of Present Illness    The patient is a 89 year old female who presents with sleep disturbances and skin itching. She is accompanied by her daughter and son, who assist with her care.  She experiences significant sleep disturbances, with difficulty falling asleep and sometimes staying awake all night. She goes to bed as early as 5 or 6 PM, which might be too early. She does not consume coffee and takes naps during the day. She has tried melatonin 10 mg without effect.  She has been experiencing itching and a rash on her legs for about a week, described as red spots. No similar symptoms in other family members. She has not tried any medication for the rash and denies any known insect bites or bugs at home. No  rash on arms, chest, or belly.  She experiences frequent nausea, especially since having COVID-19. No heartburn or burning sensation in the stomach, but she feels better after eating. No vomiting or abdominal pain. Her appetite is not strong, but her weight is stable. Nausea often affects her daily activities. currently taking protonix.  She has had two knee replacements and experiences some weakness and balance concerns, but no recent falls.  She lives with her son, who assists with cooking and daily activities. Her daughter also helps with medications.       Review of Systems:  Review of Systems  Constitutional:  Negative for fever.  Respiratory:  Negative for cough, sputum production and shortness of breath.   Cardiovascular:  Negative for chest pain and palpitations.  Gastrointestinal:  Positive for nausea. Negative for abdominal pain, blood in stool, constipation and heartburn.  Genitourinary:  Negative for dysuria, frequency and hematuria.  Musculoskeletal:  Negative for falls and myalgias.  Skin:  Positive for itching and rash.  Psychiatric/Behavioral:  Positive for memory loss. The patient has insomnia.    Negative unless indicated in HPI.   Past Medical History:  Diagnosis Date   ACUTE URIS OF UNSPECIFIED SITE 11/26/2008   ALLERGIC ARTHRITIS OTHER SPECIFIED SITES 10/22/2010   OA + RA   ALLERGIC RHINITIS 10/28/2007   ARTHRITIS, KNEES, BILATERAL 10/28/2007   BACK PAIN 04/12/2009   BUNIONECTOMY, HX OF 10/28/2007   CELLULITIS&ABSCESS OF HAND EXCEPT FINGERS&THUMB 10/23/2010   Complication of  anesthesia    itching and makes me crazy   Family history of anesthesia complication    DAUGHTER HAD HIVES AND " WENT WILD "   HYPERLIPIDEMIA 10/28/2007   HYPERTENSION 10/28/2007   INSOMNIA UNSPECIFIED 10/31/2007   MYOSITIS 10/22/2010   NECK PAIN 12/03/2008   Pain in joint, site unspecified 10/07/2010   Stroke (HCC) 06/23/2023   WRIST PAIN, LEFT 10/22/2010   Past Surgical  History:  Procedure Laterality Date   arthroscopic surgery and rotator cuff repair left shoulder     arthroscopy right knee hx of surgery     BUNIONECTOMY     hx of   FOOT SURGERY     RIGHT   hand surgery for broken finger, remote     JOINT REPLACEMENT Right    knee   TOTAL KNEE ARTHROPLASTY  07/01/2012   Procedure: TOTAL KNEE ARTHROPLASTY;  Surgeon: Thera Flake., MD;  Location: [facility] OR;  Service: Orthopedics;  Laterality: Right;  right total knee arthroplasty   TOTAL KNEE ARTHROPLASTY Left 06/13/2014   dr Eulah Pont   TOTAL KNEE ARTHROPLASTY Left 06/13/2014   Procedure: TOTAL KNEE ARTHROPLASTY;  Surgeon: Loreta Ave, MD;  Location: [facility] OR;  Service: Orthopedics;  Laterality: Left;   Social History:   reports that she has never smoked. She has never used smokeless tobacco. She reports that she does not drink alcohol and does not use drugs.  Family History  Problem Relation Age of Onset   Heart attack Father    Cancer Sister        Lung, stomach   Cancer Brother        lung cancer   Coronary artery disease Other    Heart attack Other    High blood pressure Son    High blood pressure Son    High blood pressure Son    High blood pressure Son    High blood pressure Daughter     Medications: Patient's Medications  New Prescriptions   No medications on file  Previous Medications   ACETAMINOPHEN (TYLENOL) 650 MG CR TABLET    Take 650 mg by mouth every 8 (eight) hours as needed for pain.   ALBUTEROL (VENTOLIN HFA) 108 (90 BASE) MCG/ACT INHALER    INHALE 2 PUFFS INTO THE LUNGS EVERY 6 HOURS AS NEEDED FOR WHEEZING OR SHORTNESS OF BREATH   AMLODIPINE (NORVASC) 10 MG TABLET    Take 1 tablet (10 mg total) by mouth daily.   ATENOLOL (TENORMIN) 100 MG TABLET    Take 1 tablet (100 mg total) by mouth daily.   ATORVASTATIN (LIPITOR) 40 MG TABLET    Take 1 tablet (40 mg total) by mouth daily.   B COMPLEX VITAMINS TABLET    Take 1 tablet by mouth daily.   BENZONATATE (TESSALON) 200 MG  CAPSULE    Take 1 capsule (200 mg total) by mouth 2 (two) times daily as needed for cough.   CHOLECALCIFEROL (VITAMIN D) 2000 UNITS TABLET    Take 2,000 Units by mouth daily.   CLOPIDOGREL (PLAVIX) 75 MG TABLET    Take 1 tablet (75 mg total) by mouth daily.   CYANOCOBALAMIN (VITAMIN B12) 1000 MCG TABLET    Take 1,000 mcg by mouth daily.   DEXTROMETHORPHAN-GUAIFENESIN (MUCINEX DM) 30-600 MG 12HR TABLET    Take 1 tablet by mouth 2 (two) times daily.   DORZOLAMIDE-TIMOLOL (COSOPT) 22.3-6.8 MG/ML OPHTHALMIC SOLUTION    Place 1 drop into both eyes 2 (two) times daily.  FEXOFENADINE (ALLEGRA) 180 MG TABLET    Take 180 mg by mouth daily.   HYDROXYCHLOROQUINE (PLAQUENIL) 200 MG TABLET    Take 200 mg by mouth 2 (two) times daily.   LATANOPROST (XALATAN) 0.005 % OPHTHALMIC SOLUTION    Place 1 drop into both eyes at bedtime.   NETARSUDIL DIMESYLATE (RHOPRESSA) 0.02 % SOLN    Place 1 drop into the right eye at bedtime.   ONDANSETRON (ZOFRAN) 4 MG TABLET    TAKE 1 TABLET(4 MG) BY MOUTH EVERY 8 HOURS AS NEEDED FOR NAUSEA OR VOMITING   PANTOPRAZOLE (PROTONIX) 40 MG TABLET    Take 1 tablet (40 mg total) by mouth 2 (two) times daily.   POLYETHYLENE GLYCOL POWDER (GLYCOLAX/MIRALAX) 17 GM/SCOOP POWDER    Take 1 Container by mouth once.   TRAMADOL (ULTRAM) 50 MG TABLET    TAKE 1 TABLET EVERY 12 HOURS AS NEEDED FOR MODERATE PAIN  Modified Medications   No medications on file  Discontinued Medications   DOXYCYCLINE (VIBRA-TABS) 100 MG TABLET    Take 1 tablet (100 mg total) by mouth 2 (two) times daily.    Physical Exam: Vitals:   11/09/23 1038  BP: 116/64  Pulse: 64  Resp: 16  Temp: (!) 97.1 F (36.2 C)  SpO2: 98%  Weight: 156 lb 3.2 oz (70.9 kg)  Height: 5\' 4"  (1.626 m)   Body mass index is 26.81 kg/m. BP Readings from Last 3 Encounters:  11/09/23 116/64  09/19/23 (!) 148/51  09/15/23 124/78   Wt Readings from Last 3 Encounters:  11/09/23 156 lb 3.2 oz (70.9 kg)  09/15/23 154 lb 12.8 oz (70.2  kg)  08/11/23 154 lb 6.4 oz (70 kg)    Physical Exam Constitutional:      Appearance: Normal appearance.  HENT:     Head: Normocephalic and atraumatic.  Cardiovascular:     Rate and Rhythm: Normal rate and regular rhythm.  Pulmonary:     Effort: Pulmonary effort is normal. No respiratory distress.     Breath sounds: Normal breath sounds. No wheezing.  Abdominal:     General: Bowel sounds are normal. There is no distension.     Tenderness: There is no abdominal tenderness. There is no guarding.     Comments:    Musculoskeletal:        General: No swelling or tenderness.  Skin:    Findings: Rash present.     Comments: 1-2 Raised erythematous numps noted on each leg    Neurological:     Mental Status: She is alert. Mental status is at baseline.     Sensory: No sensory deficit.     Motor: No weakness.     Labs reviewed: Basic Metabolic Panel: Recent Labs    01/07/23 1031 06/23/23 1321 06/23/23 2156 09/15/23 1101 09/19/23 1333  NA 140   < > 140 143 140  K 3.8   < > 3.6 3.8 3.6  CL 104   < > 104 104 104  CO2 30   < > 25 29 29   GLUCOSE 118*   < > 118* 134* 115*  BUN 17   < > 7* 15 14  CREATININE 0.92   < > 0.84 1.04* 1.07*  CALCIUM 9.6   < > 9.4 9.7 9.3  MG  --   --  1.9  --   --   PHOS  --   --  3.5  --   --   TSH 2.12  --  1.440  --   --    < > =  values in this interval not displayed.   Liver Function Tests: Recent Labs    01/07/23 1031 06/23/23 2156 09/15/23 1101  AST 30 26 25   ALT 41* 25 30*  ALKPHOS 115 114  --   BILITOT 0.9 0.9 1.3*  PROT 7.5 8.0 7.5  ALBUMIN 4.3 3.8  --    Recent Labs    06/23/23 2156 09/15/23 1101  LIPASE 28 22   No results for input(s): "AMMONIA" in the last 8760 hours. CBC: Recent Labs    01/07/23 1031 06/23/23 1321 09/19/23 1333  WBC 7.0 9.0 8.7  NEUTROABS 4.7  --  6.5  HGB 13.2 12.8 13.4  HCT 39.5 37.7 39.4  MCV 92.8 89.3 92.1  PLT 175.0 183 199   Lipid Panel: Recent Labs    01/07/23 1031 06/24/23 0726   CHOL 192 176  HDL 119.40 100  LDLCALC 63 66  TRIG 48.0 52  CHOLHDL 2 1.8   TSH: Recent Labs    01/07/23 1031 06/23/23 2156  TSH 2.12 1.440   A1C: Lab Results  Component Value Date   HGBA1C 6.3 (H) 06/23/2023     Assessment/Plan   1. Nausea (Primary)  Chronic nausea, worse with eating. No abdominal pain, vomiting, or changes in bowel habits. Normal liver enzymes and kidney function on recent blood work. - Ambulatory referral to Gastroenterology - CT ABDOMEN PELVIS W CONTRAST; Future  2. Rheumatoid arthritis, involving unspecified site, unspecified whether rheumatoid factor present (HCC)  Follow up with rheumatology  Daughter reported that she had eye appt 3 months ago and is due for another one  3. Cerebrovascular accident (CVA), unspecified mechanism (HCC) 30 day event monitor did not show Afib as per neurology note  Pt followed with neurology who recommended loop recorder   Cont with plavix, lipitor  - Ambulatory referral to Home Health  4. Essential hypertension, benign  At goal  Cont with the same  5. Balance problems  Ambulatory referral to Home Health  6. Memory deficit  MMSE  23/30 Daughter helps with medication management , cooking , cleaning Pt is mostly independent with ADLS but needs help with IADLS   7. Rash  Itchy skin lesions on legs for 1 week . No systemic symptoms or widespread rash. -Apply kenalog cream to affected areas. -Take Claritin or Zyrtec for itching. - triamcinolone cream (KENALOG) 0.1 %; Apply 1 Application topically 2 (two) times daily.  Dispense: 30 g; Refill: 0  8. Primary insomnia Difficulty initiating sleep,   daytime napping.   -Continue Melatonin 10mg  at bedtime. -Avoid daytime napping and maintain good sleep hygiene.   45 min Total time spent for obtaining history,  performing a medically appropriate examination and evaluation, reviewing the tests,ordering  tests,  documenting clinical information in the electronic or  other health record,  ,care coordination (not separately reported)

## 2023-11-17 DIAGNOSIS — M5416 Radiculopathy, lumbar region: Secondary | ICD-10-CM | POA: Diagnosis not present

## 2023-11-17 DIAGNOSIS — I129 Hypertensive chronic kidney disease with stage 1 through stage 4 chronic kidney disease, or unspecified chronic kidney disease: Secondary | ICD-10-CM | POA: Diagnosis not present

## 2023-11-17 DIAGNOSIS — M059 Rheumatoid arthritis with rheumatoid factor, unspecified: Secondary | ICD-10-CM | POA: Diagnosis not present

## 2023-11-17 DIAGNOSIS — M5430 Sciatica, unspecified side: Secondary | ICD-10-CM | POA: Diagnosis not present

## 2023-11-17 DIAGNOSIS — H8113 Benign paroxysmal vertigo, bilateral: Secondary | ICD-10-CM | POA: Diagnosis not present

## 2023-11-17 DIAGNOSIS — N1832 Chronic kidney disease, stage 3b: Secondary | ICD-10-CM | POA: Diagnosis not present

## 2023-11-17 DIAGNOSIS — R2689 Other abnormalities of gait and mobility: Secondary | ICD-10-CM | POA: Diagnosis not present

## 2023-11-17 DIAGNOSIS — E1122 Type 2 diabetes mellitus with diabetic chronic kidney disease: Secondary | ICD-10-CM | POA: Diagnosis not present

## 2023-11-17 DIAGNOSIS — I69398 Other sequelae of cerebral infarction: Secondary | ICD-10-CM | POA: Diagnosis not present

## 2023-12-06 DIAGNOSIS — Z6826 Body mass index (BMI) 26.0-26.9, adult: Secondary | ICD-10-CM | POA: Diagnosis not present

## 2023-12-06 DIAGNOSIS — Z79899 Other long term (current) drug therapy: Secondary | ICD-10-CM | POA: Diagnosis not present

## 2023-12-06 DIAGNOSIS — M1991 Primary osteoarthritis, unspecified site: Secondary | ICD-10-CM | POA: Diagnosis not present

## 2023-12-06 DIAGNOSIS — E663 Overweight: Secondary | ICD-10-CM | POA: Diagnosis not present

## 2023-12-06 DIAGNOSIS — M0609 Rheumatoid arthritis without rheumatoid factor, multiple sites: Secondary | ICD-10-CM | POA: Diagnosis not present

## 2023-12-07 ENCOUNTER — Ambulatory Visit
Admission: RE | Admit: 2023-12-07 | Discharge: 2023-12-07 | Disposition: A | Discharge status: Home / Self Care | Payer: Medicare HMO | Source: Ambulatory Visit | Attending: Sports Medicine

## 2023-12-07 DIAGNOSIS — R110 Nausea: Secondary | ICD-10-CM | POA: Diagnosis not present

## 2023-12-07 MED ORDER — IOPAMIDOL (ISOVUE-300) INJECTION 61%
200.0000 mL | Freq: Once | INTRAVENOUS | Status: AC | PRN
  Administered 2023-12-07: 100 mL via INTRAVENOUS

## 2023-12-08 ENCOUNTER — Telehealth: Payer: Self-pay | Admitting: Sports Medicine

## 2023-12-08 DIAGNOSIS — D219 Benign neoplasm of connective and other soft tissue, unspecified: Secondary | ICD-10-CM

## 2023-12-08 DIAGNOSIS — N898 Other specified noninflammatory disorders of vagina: Secondary | ICD-10-CM

## 2023-12-08 NOTE — Telephone Encounter (Signed)
 Briana Sheffield, MD  [facility]11 minutes ago (3:46 PM)    Plz call the patient and inform that CT showed cyst in her vagina ,she needs to see GYN      Tried calling patient, LMOM to return call.

## 2023-12-08 NOTE — Telephone Encounter (Signed)
 IMPRESSION: Cystic appearing lesion within the vagina measuring 4.6 x 2 x 3.6 cm in the longitudinal, AP and transverse diameter. Hematocolpos? The uterus demonstrates ill-defined area of decreased attenuation in the left lateral fundal region of 2.2 x 1.8 cm that could correlate with fibroid degeneration.   Will refer to GYN

## 2023-12-12 NOTE — Progress Notes (Unsigned)
  Electrophysiology Office Note:    Date:  12/13/2023   ID:  CRISTLE JARED, DOB 1931/09/17, MRN [id number]  [facility] Cardiologist:  None  [facility] Electrophysiologist:  Lanier Prude, MD   Referring MD: Glean Salvo, NP   Chief Complaint: History of stroke  History of Present Illness:    [name] is a 89 year old woman who I am seeing today for an evaluation of cryptogenic stroke at the request of [name] Ege, NP.  The patient has a history of cryptogenic stroke, COVID, hypertension, hyperlipidemia.  The patient saw neurology July 27, 2023.  She has a history of hemorrhagic right parietal infarct.  She has been on Plavix for secondary stroke prevention.  She is allergic to aspirin.  She has severe left atrial dilation and the neurology team is concerned about the possibility of atrial fibrillation.  She presents to discuss possible loop recorder implant.  She is with her daughter today in clinic.  She is not in favor of any invasive procedures.     Their past medical, social and family history was reviewed.   ROS:   Please see the history of present illness.    All other systems reviewed and are negative.  EKGs/Labs/Other Studies Reviewed:    The following studies were reviewed today:  September 12, 2023 30-day monitor No atrial fibrillation   June 24, 2023 echo EF 60% RV normal Severely dilated left atrium        Physical Exam:    VS:  BP (!) 118/58   Pulse (!) 56   Ht 5\' 4"  (1.626 m)   Wt 152 lb 12.8 oz (69.3 kg)   SpO2 99%   BMI 26.23 kg/m     Wt Readings from Last 3 Encounters:  12/13/23 152 lb 12.8 oz (69.3 kg)  11/09/23 156 lb 3.2 oz (70.9 kg)  09/15/23 154 lb 12.8 oz (70.2 kg)     GEN: no distress.  Elderly CARD: RRR, No MRG RESP: No IWOB. CTAB.        ASSESSMENT AND PLAN:    1. Cryptogenic stroke [facility])     #Cryptogenic stroke The patient has a history of cryptogenic stroke.  The neurologists are concerned  about the possibility of atrial fibrillation given her severe left atrial dilation and history of cryptogenic stroke.  I discussed the loop recorder monitoring in detail during today's visit.  30-day monitor has previously not shown evidence of atrial fibrillation.  I discussed the loop recorder implant procedure in detail including the risks and monthly monitoring costs.  After our discussion, the patient elected to not proceed with implant which is very reasonable.  She will continue her Plavix and statin.  She will let us know should she change her mind.  Follow-up with EP on an as-needed basis.   Signed, Rossie Muskrat. Lalla Brothers, MD, [facility], [facility] 12/13/2023 1:28 PM    Electrophysiology [facility]

## 2023-12-13 ENCOUNTER — Encounter: Payer: Self-pay | Admitting: Cardiology

## 2023-12-13 ENCOUNTER — Ambulatory Visit: Payer: Medicare HMO | Attending: Cardiology | Admitting: Cardiology

## 2023-12-13 ENCOUNTER — Telehealth: Payer: Self-pay | Admitting: Emergency Medicine

## 2023-12-13 VITALS — BP 118/58 | HR 56 | Ht 64.0 in | Wt 152.8 lb

## 2023-12-13 DIAGNOSIS — I639 Cerebral infarction, unspecified: Secondary | ICD-10-CM

## 2023-12-13 NOTE — Telephone Encounter (Signed)
 Briana [name], daughter called and stated that patient has an appointment with [facility] scheduled with Dr. Richardean Chimera for 12/15/2023 at 110  Daughter is requesting for the CT Scan to be faxed to Dr. Juluis Mire office.   Faxed as requested.

## 2023-12-13 NOTE — Patient Instructions (Signed)
 Medication Instructions:  Your physician recommends that you continue on your current medications as directed. Please refer to the Current Medication list given to you today.  *If you need a refill on your cardiac medications before your next appointment, please call your pharmacy*  Follow-Up: At [facility], you and your health needs are our priority.  As part of our continuing mission to provide you with exceptional heart care, we have created designated Provider Care Teams.  These Care Teams include your primary Cardiologist (physician) and Advanced Practice Providers (APPs -  Physician Assistants and Nurse Practitioners) who all work together to provide you with the care you need, when you need it.  Your next appointment:   As needed with Dr. Lalla Brothers

## 2023-12-16 DIAGNOSIS — R1900 Intra-abdominal and pelvic swelling, mass and lump, unspecified site: Secondary | ICD-10-CM | POA: Diagnosis not present

## 2023-12-17 DIAGNOSIS — H8113 Benign paroxysmal vertigo, bilateral: Secondary | ICD-10-CM | POA: Diagnosis not present

## 2023-12-17 DIAGNOSIS — H04123 Dry eye syndrome of bilateral lacrimal glands: Secondary | ICD-10-CM | POA: Diagnosis not present

## 2023-12-17 DIAGNOSIS — H10413 Chronic giant papillary conjunctivitis, bilateral: Secondary | ICD-10-CM | POA: Diagnosis not present

## 2023-12-17 DIAGNOSIS — H43813 Vitreous degeneration, bilateral: Secondary | ICD-10-CM | POA: Diagnosis not present

## 2023-12-17 DIAGNOSIS — E1122 Type 2 diabetes mellitus with diabetic chronic kidney disease: Secondary | ICD-10-CM | POA: Diagnosis not present

## 2023-12-17 DIAGNOSIS — Z961 Presence of intraocular lens: Secondary | ICD-10-CM | POA: Diagnosis not present

## 2023-12-17 DIAGNOSIS — M5430 Sciatica, unspecified side: Secondary | ICD-10-CM | POA: Diagnosis not present

## 2023-12-17 DIAGNOSIS — I639 Cerebral infarction, unspecified: Secondary | ICD-10-CM | POA: Diagnosis not present

## 2023-12-17 DIAGNOSIS — M5416 Radiculopathy, lumbar region: Secondary | ICD-10-CM | POA: Diagnosis not present

## 2023-12-17 DIAGNOSIS — H401113 Primary open-angle glaucoma, right eye, severe stage: Secondary | ICD-10-CM | POA: Diagnosis not present

## 2023-12-17 DIAGNOSIS — I129 Hypertensive chronic kidney disease with stage 1 through stage 4 chronic kidney disease, or unspecified chronic kidney disease: Secondary | ICD-10-CM | POA: Diagnosis not present

## 2023-12-17 DIAGNOSIS — R2689 Other abnormalities of gait and mobility: Secondary | ICD-10-CM | POA: Diagnosis not present

## 2023-12-17 DIAGNOSIS — I69398 Other sequelae of cerebral infarction: Secondary | ICD-10-CM | POA: Diagnosis not present

## 2023-12-17 DIAGNOSIS — H26493 Other secondary cataract, bilateral: Secondary | ICD-10-CM | POA: Diagnosis not present

## 2023-12-17 DIAGNOSIS — N1832 Chronic kidney disease, stage 3b: Secondary | ICD-10-CM | POA: Diagnosis not present

## 2023-12-17 DIAGNOSIS — M059 Rheumatoid arthritis with rheumatoid factor, unspecified: Secondary | ICD-10-CM | POA: Diagnosis not present

## 2023-12-20 ENCOUNTER — Ambulatory Visit: Payer: Medicare HMO | Admitting: Family

## 2023-12-20 ENCOUNTER — Encounter: Payer: Self-pay | Admitting: Family

## 2023-12-20 ENCOUNTER — Telehealth: Payer: Self-pay | Admitting: Cardiology

## 2023-12-20 ENCOUNTER — Encounter: Payer: Medicare HMO | Admitting: Family

## 2023-12-20 DIAGNOSIS — Z0000 Encounter for general adult medical examination without abnormal findings: Secondary | ICD-10-CM

## 2023-12-20 NOTE — Patient Instructions (Signed)
 Briana [name] , Thank you for taking time to come for your Medicare Wellness Visit. I appreciate your ongoing commitment to your health goals. Please review the following plan we discussed and let me know if I can assist you in the future.   Screening recommendations/referrals: Colonoscopy ;N/A Mammogram : Up to date  Bone Density  Recommended yearly ophthalmology/optometry visit for glaucoma screening and checkup Recommended yearly dental visit for hygiene and checkup  Vaccinations: Influenza vaccine- due annually in September/October Pneumococcal vaccine : Up to date  Tdap vaccine : Due please get tetanus vaccine at the pharmacy  Shingles vaccine : Up to date     Advanced directives: No   Conditions/risks identified:  advanced age (>58men, >62 women);dyslipidemia;hypertension;sedentary lifestyle  Next appointment: 1 year    Preventive Care 24 Years and Older, Female Preventive care refers to lifestyle choices and visits with your health care provider that can promote health and wellness. What does preventive care include? A yearly physical exam. This is also called an annual well check. Dental exams once or twice a year. Routine eye exams. Ask your health care provider how often you should have your eyes checked. Personal lifestyle choices, including: Daily care of your teeth and gums. Regular physical activity. Eating a healthy diet. Avoiding tobacco and drug use. Limiting alcohol use. Practicing safe sex. Taking low-dose aspirin every day. Taking vitamin and mineral supplements as recommended by your health care provider. What happens during an annual well check? The services and screenings done by your health care provider during your annual well check will depend on your age, overall health, lifestyle risk factors, and family history of disease. Counseling  Your health care provider may ask you questions about your: Alcohol use. Tobacco use. Drug use. Emotional  well-being. Home and relationship well-being. Sexual activity. Eating habits. History of falls. Memory and ability to understand (cognition). Work and work Astronomer. Reproductive health. Screening  You may have the following tests or measurements: Height, weight, and BMI. Blood pressure. Lipid and cholesterol levels. These may be checked every 5 years, or more frequently if you are over 71 years old. Skin check. Lung cancer screening. You may have this screening every year starting at age 73 if you have a 30-pack-year history of smoking and currently smoke or have quit within the past 15 years. Fecal occult blood test (FOBT) of the stool. You may have this test every year starting at age 65. Flexible sigmoidoscopy or colonoscopy. You may have a sigmoidoscopy every 5 years or a colonoscopy every 10 years starting at age 43. Hepatitis C blood test. Hepatitis B blood test. Sexually transmitted disease (STD) testing. Diabetes screening. This is done by checking your blood sugar (glucose) after you have not eaten for a while (fasting). You may have this done every 1-3 years. Bone density scan. This is done to screen for osteoporosis. You may have this done starting at age 75. Mammogram. This may be done every 1-2 years. Talk to your health care provider about how often you should have regular mammograms. Talk with your health care provider about your test results, treatment options, and if necessary, the need for more tests. Vaccines  Your health care provider may recommend certain vaccines, such as: Influenza vaccine. This is recommended every year. Tetanus, diphtheria, and acellular pertussis (Tdap, Td) vaccine. You may need a Td booster every 10 years. Zoster vaccine. You may need this after age 29. Pneumococcal 13-valent conjugate (PCV13) vaccine. One dose is recommended after age 71. Pneumococcal  polysaccharide (PPSV23) vaccine. One dose is recommended after age 71. Talk to your  health care provider about which screenings and vaccines you need and how often you need them. This information is not intended to replace advice given to you by your health care provider. Make sure you discuss any questions you have with your health care provider. Document Released: 10/25/2015 Document Revised: 06/17/2016 Document Reviewed: 07/30/2015 Elsevier Interactive Patient Education  2017 [employer].  Fall Prevention in the Home Falls can cause injuries. They can happen to people of all ages. There are many things you can do to make your home safe and to help prevent falls. What can I do on the outside of my home? Regularly fix the edges of walkways and driveways and fix any cracks. Remove anything that might make you trip as you walk through a door, such as a raised step or threshold. Trim any bushes or trees on the path to your home. Use bright outdoor lighting. Clear any walking paths of anything that might make someone trip, such as rocks or tools. Regularly check to see if handrails are loose or broken. Make sure that both sides of any steps have handrails. Any raised decks and porches should have guardrails on the edges. Have any leaves, snow, or ice cleared regularly. Use sand or salt on walking paths during winter. Clean up any spills in your garage right away. This includes oil or grease spills. What can I do in the bathroom? Use night lights. Install grab bars by the toilet and in the tub and shower. Do not use towel bars as grab bars. Use non-skid mats or decals in the tub or shower. If you need to sit down in the shower, use a plastic, non-slip stool. Keep the floor dry. Clean up any water that spills on the floor as soon as it happens. Remove soap buildup in the tub or shower regularly. Attach bath mats securely with double-sided non-slip rug tape. Do not have throw rugs and other things on the floor that can make you trip. What can I do in the bedroom? Use night  lights. Make sure that you have a light by your bed that is easy to reach. Do not use any sheets or blankets that are too big for your bed. They should not hang down onto the floor. Have a firm chair that has side arms. You can use this for support while you get dressed. Do not have throw rugs and other things on the floor that can make you trip. What can I do in the kitchen? Clean up any spills right away. Avoid walking on wet floors. Keep items that you use a lot in easy-to-reach places. If you need to reach something above you, use a strong step stool that has a grab bar. Keep electrical cords out of the way. Do not use floor polish or wax that makes floors slippery. If you must use wax, use non-skid floor wax. Do not have throw rugs and other things on the floor that can make you trip. What can I do with my stairs? Do not leave any items on the stairs. Make sure that there are handrails on both sides of the stairs and use them. Fix handrails that are broken or loose. Make sure that handrails are as long as the stairways. Check any carpeting to make sure that it is firmly attached to the stairs. Fix any carpet that is loose or worn. Avoid having throw rugs at the top  or bottom of the stairs. If you do have throw rugs, attach them to the floor with carpet tape. Make sure that you have a light switch at the top of the stairs and the bottom of the stairs. If you do not have them, ask someone to add them for you. What else can I do to help prevent falls? Wear shoes that: Do not have high heels. Have rubber bottoms. Are comfortable and fit you well. Are closed at the toe. Do not wear sandals. If you use a stepladder: Make sure that it is fully opened. Do not climb a closed stepladder. Make sure that both sides of the stepladder are locked into place. Ask someone to hold it for you, if possible. Clearly mark and make sure that you can see: Any grab bars or handrails. First and last  steps. Where the edge of each step is. Use tools that help you move around (mobility aids) if they are needed. These include: Canes. Walkers. Scooters. Crutches. Turn on the lights when you go into a dark area. Replace any light bulbs as soon as they burn out. Set up your furniture so you have a clear path. Avoid moving your furniture around. If any of your floors are uneven, fix them. If there are any pets around you, be aware of where they are. Review your medicines with your doctor. Some medicines can make you feel dizzy. This can increase your chance of falling. Ask your doctor what other things that you can do to help prevent falls. This information is not intended to replace advice given to you by your health care provider. Make sure you discuss any questions you have with your health care provider. Document Released: 07/25/2009 Document Revised: 03/05/2016 Document Reviewed: 11/02/2014 Elsevier Interactive Patient Education  2017 [employer].

## 2023-12-20 NOTE — Progress Notes (Signed)
 Subjective:   Briana [name] is a 89 y.o. female who presents for Medicare Annual (Subsequent) preventive examination.  Visit Complete: Virtual I connected with  Briana [name] on 12/20/23 by a video and audio enabled telemedicine application and verified that I am speaking with the correct person using two identifiers.  Patient Location: Home  Provider Location: Office/Clinic  I discussed the limitations of evaluation and management by telemedicine. The patient expressed understanding and agreed to proceed.  Vital Signs: Because this visit was a virtual/telehealth visit, some criteria may be missing or patient reported. Any vitals not documented were not able to be obtained and vitals that have been documented are patient reported.  Patient Medicare AWV questionnaire was completed by the patient on 12/20/2023 ; I have confirmed that all information answered by patient is correct and no changes since this date.  Cardiac Risk Factors include: advanced age (>59men, >70 women);dyslipidemia;hypertension;sedentary lifestyle     Objective:    There were no vitals filed for this visit. There is no height or weight on file to calculate BMI.     12/20/2023    3:19 PM 11/09/2023   10:45 AM 09/19/2023   12:16 PM 09/15/2023   10:02 AM 08/11/2023    1:03 PM 08/10/2023    8:36 AM 06/24/2023   11:59 AM  Advanced Directives  Does Patient Have a Medical Advance Directive? No No No No No No   Would patient like information on creating a medical advance directive? No - Patient declined No - Patient declined No - Patient declined No - Patient declined No - Patient declined No - Patient declined Yes (Inpatient - patient defers creating a medical advance directive and declines information at this time)    Current Medications (verified) Outpatient Encounter Medications as of 12/20/2023  Medication Sig   acetaminophen (TYLENOL) 650 MG CR tablet Take 650 mg by mouth every 8 (eight) hours as needed for  pain.   amLODipine (NORVASC) 10 MG tablet Take 1 tablet (10 mg total) by mouth daily.   atenolol (TENORMIN) 100 MG tablet Take 1 tablet (100 mg total) by mouth daily.   b complex vitamins tablet Take 1 tablet by mouth daily.   Cholecalciferol (VITAMIN D) 2000 UNITS tablet Take 2,000 Units by mouth daily.   clopidogrel (PLAVIX) 75 MG tablet Take 1 tablet (75 mg total) by mouth daily.   dorzolamide-timolol (COSOPT) 22.3-6.8 MG/ML ophthalmic solution Place 1 drop into both eyes 2 (two) times daily.   fexofenadine (ALLEGRA) 180 MG tablet Take 180 mg by mouth daily.   hydroxychloroquine (PLAQUENIL) 200 MG tablet Take 200 mg by mouth 2 (two) times daily.   latanoprost (XALATAN) 0.005 % ophthalmic solution Place 1 drop into both eyes at bedtime.   Netarsudil Dimesylate (RHOPRESSA) 0.02 % SOLN Place 1 drop into the right eye at bedtime.   pantoprazole (PROTONIX) 40 MG tablet Take 1 tablet (40 mg total) by mouth 2 (two) times daily.   polyethylene glycol powder (GLYCOLAX/MIRALAX) 17 GM/SCOOP powder Take 1 Container by mouth once.   triamcinolone cream (KENALOG) 0.1 % Apply 1 Application topically 2 (two) times daily.   albuterol (VENTOLIN HFA) 108 (90 Base) MCG/ACT inhaler INHALE 2 PUFFS INTO THE LUNGS EVERY 6 HOURS AS NEEDED FOR WHEEZING OR SHORTNESS OF BREATH (Patient not taking: Reported on 12/20/2023)   atorvastatin (LIPITOR) 40 MG tablet Take 1 tablet (40 mg total) by mouth daily.   cyanocobalamin (VITAMIN B12) 1000 MCG tablet Take 1,000 mcg by mouth daily. (  Patient not taking: Reported on 12/20/2023)   No facility-administered encounter medications on file as of 12/20/2023.    Allergies (verified) Ibuprofen; Percocet [oxycodone-acetaminophen]; Simvastatin; 2,4-d dimethylamine; Diltiazem hcl; Hydrochlorothiazide; Lisinopril; Valacyclovir; and Verapamil   History: Past Medical History:  Diagnosis Date   ACUTE URIS OF UNSPECIFIED SITE 11/26/2008   ALLERGIC ARTHRITIS OTHER SPECIFIED SITES  10/22/2010   OA + RA   ALLERGIC RHINITIS 10/28/2007   ARTHRITIS, KNEES, BILATERAL 10/28/2007   BACK PAIN 04/12/2009   BUNIONECTOMY, HX OF 10/28/2007   CELLULITIS&ABSCESS OF HAND EXCEPT FINGERS&THUMB 10/23/2010   Complication of anesthesia    itching and makes me crazy   Family history of anesthesia complication    DAUGHTER HAD HIVES AND " WENT WILD "   HYPERLIPIDEMIA 10/28/2007   HYPERTENSION 10/28/2007   INSOMNIA UNSPECIFIED 10/31/2007   MYOSITIS 10/22/2010   NECK PAIN 12/03/2008   Pain in joint, site unspecified 10/07/2010   Stroke (HCC) 06/23/2023   WRIST PAIN, LEFT 10/22/2010   Past Surgical History:  Procedure Laterality Date   arthroscopic surgery and rotator cuff repair left shoulder     arthroscopy right knee hx of surgery     BUNIONECTOMY     hx of   FOOT SURGERY     RIGHT   hand surgery for broken finger, remote     JOINT REPLACEMENT Right    knee   TOTAL KNEE ARTHROPLASTY  07/01/2012   Procedure: TOTAL KNEE ARTHROPLASTY;  Surgeon: Thera Flake., MD;  Location: [facility] OR;  Service: Orthopedics;  Laterality: Right;  right total knee arthroplasty   TOTAL KNEE ARTHROPLASTY Left 06/13/2014   dr Eulah Pont   TOTAL KNEE ARTHROPLASTY Left 06/13/2014   Procedure: TOTAL KNEE ARTHROPLASTY;  Surgeon: Loreta Ave, MD;  Location: [facility] OR;  Service: Orthopedics;  Laterality: Left;   Family History  Problem Relation Age of Onset   Heart attack Father    Cancer Sister        Lung, stomach   Cancer Brother        lung cancer   Coronary artery disease Other    Heart attack Other    High blood pressure Son    High blood pressure Son    High blood pressure Son    High blood pressure Son    High blood pressure Daughter    Social History   Socioeconomic History   Marital status: Widowed    Spouse name: Not on file   Number of children: 4   Years of education: 12   Highest education level: Not on file  Occupational History   Occupation: ASSN II 1    Employer: [employer]  Tobacco Use   Smoking status: Never   Smokeless tobacco: Never  Vaping Use   Vaping status: Never Used  Substance and Sexual Activity   Alcohol use: No   Drug use: No   Sexual activity: Not Currently  Other Topics Concern   Not on file  Social History Narrative   Married in [place] widowed on 004 sons, 1 daughter, 6 grandchildren, 3 great-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         Per [facility] [place] Packet Abstracted on 08/10/2023:      Diet: Blank      Caffeine: None      Married, if yes what year:  4      Do you live in a house,  apartment, assisted living, condo, trailer, ect: House      Is it one or more stories: One      How many persons live in your home? 2      Pets: No pets      Highest level or education completed:  11th grade      Current/Past profession: Psychiatric nurse      Exercise:       yes           Type and how often: standard          Living Will: No   DNR: no   POA/HPOA: no      Functional Status:   Do you have difficulty bathing or dressing yourself? no   Do you have difficulty preparing food or eating?Yes   Do you have difficulty managing your medications?Yes   Do you have difficulty managing your finances? ?   Do you have difficulty affording your medications? No      Social Drivers of Corporate investment banker Strain: Low Risk  (12/10/2022)   Overall Financial Resource Strain (CARDIA)    Difficulty of Paying Living Expenses: Not hard at all  Food Insecurity: No Food Insecurity (06/29/2023)   Hunger Vital Sign    Worried About Running Out of Food in the Last Year: Never true    Ran Out of Food in the Last Year: Never true  Transportation Needs: No Transportation Needs (06/29/2023)   PRAPARE - Administrator, Civil Service (Medical): No    Lack of Transportation (Non-Medical): No  Physical Activity: Insufficiently Active (12/10/2022)   Exercise  Vital Sign    Days of Exercise per Week: 7 days    Minutes of Exercise per Session: 10 min  Stress: No Stress Concern Present (12/10/2022)   [employer] of Occupational Health - Occupational Stress Questionnaire    Feeling of Stress : Only a little  Social Connections: Moderately Integrated (12/10/2022)   Social Connection and Isolation Panel [NHANES]    Frequency of Communication with Friends and Family: More than three times a week    Frequency of Social Gatherings with Friends and Family: More than three times a week    Attends Religious Services: More than 4 times per year    Active Member of [employer] or Organizations: Yes    Attends Banker Meetings: More than 4 times per year    Marital Status: Widowed    Tobacco Counseling Counseling given: Not Answered   Clinical Intake:  Pre-visit preparation completed: No  Pain : No/denies pain   BMI - recorded: 26.23 Nutritional Status: BMI of 19-24  Normal Nutritional Risks: None (nausea) Diabetes: No  How often do you need to have someone help you when you read instructions, pamphlets, or other written materials from your doctor or pharmacy?: 5 - Always What is the last grade level you completed in school?: 11 grade  Interpreter Needed?: No      Activities of Daily Living    12/20/2023    4:12 PM 06/24/2023    6:41 AM  In your present state of health, do you have any difficulty performing the following activities:  Hearing? 0 0  Vision? 1 1  Difficulty concentrating or making decisions? 0 0  Walking or climbing stairs? 1 1  Comment uses a walker   Dressing or bathing? 0 1  Doing errands, shopping? 1 0  Preparing Food and eating ? Y   Comment son and  daughter assist   Using the Toilet? N   In the past six months, have you accidently leaked urine? N   Do you have problems with loss of bowel control? N   Managing your Medications? Y   Managing your Finances? Y   Comment daughter assist   Housekeeping  or managing your Housekeeping? Y   Comment daughter assist     Patient Care Team: Venita Sheffield, MD as PCP - General (Internal Medicine) Lanier Prude, MD as PCP - Electrophysiology (Cardiology) Zenovia Jordan, MD as Consulting Physician (Rheumatology) Ernesto Rutherford, MD as Consulting Physician (Ophthalmology) Iva Boop, MD as Consulting Physician (Gastroenterology) Kathyrn Sheriff, [facility] (Inactive) as Pharmacist (Pharmacist) Glean Salvo, NP as Nurse Practitioner (Neurology) Richardean Chimera, MD as Consulting Physician (Obstetrics and Gynecology)  Indicate any recent Medical Services you may have received from other than [facility] providers in the past year (date may be approximate).     Assessment:   This is a routine wellness examination for Shaliah.  Hearing/Vision screen No results found.   Goals Addressed   None    Depression Screen    12/20/2023    3:20 PM 11/09/2023   11:15 AM 08/11/2023    1:04 PM 03/03/2023   10:20 AM 12/10/2022    2:59 PM 10/22/2022    1:27 PM 11/21/2021    1:15 PM  PHQ 2/9 Scores  PHQ - 2 Score 0 0 0 0 0 0 0  PHQ- 9 Score     0 0     Fall Risk    12/20/2023    3:20 PM 11/09/2023   11:15 AM 11/09/2023   10:45 AM 09/15/2023   10:02 AM 08/11/2023    1:03 PM  Fall Risk   Falls in the past year? 0 0 0 0 0  Number falls in past yr: 0 0 0 0 0  Injury with Fall? 0 0 0 0 0  Risk for fall due to : No Fall Risks  No Fall Risks No Fall Risks No Fall Risks  Follow up Falls evaluation completed;Education provided;Falls prevention discussed  Falls evaluation completed;Education provided;Falls prevention discussed Falls evaluation completed;Education provided;Falls prevention discussed     MEDICARE RISK AT HOME:    TIMED UP AND GO:  Was the test performed?  No    Cognitive Function:    11/09/2023   11:20 AM 06/20/2019    2:16 PM 06/02/2018    9:30 AM  MMSE - Mini Mental State Exam  Not completed:  Refused   Orientation to time 4  5   Orientation to Place 4  5  Registration 3  3  Attention/ Calculation 2  3  Recall 2  3  Language- name 2 objects 2  2  Language- repeat 1  1  Language- follow 3 step command 3  3  Language- read & follow direction 1  1  Write a sentence 1  1  Copy design 0  1  Total score 23  28        12/10/2022    3:00 PM 11/21/2021    1:22 PM  6CIT Screen  What Year? 0 points 0 points  What month? 0 points 0 points  What time? 0 points 0 points  Count back from 20 0 points 0 points  Months in reverse 0 points 0 points  Repeat phrase 0 points 0 points  Total Score 0 points 0 points    Immunizations Immunization History  Administered Date(s) Administered  Fluad Trivalent(High Dose 65+) 06/25/2023   Influenza Split 07/03/2012   Influenza, High Dose Seasonal PF 07/24/2013, 07/15/2018, 07/15/2020, 07/22/2022   Influenza,inj,Quad PF,6+ Mos 08/06/2014   Influenza-Unspecified 07/12/2017, 07/18/2019, 07/15/2021   Moderna Covid-19 Fall Seasonal Vaccine 76yrs & older 07/31/2022, 07/23/2023   Moderna Sars-Covid-2 Vaccination 10/24/2019, 11/21/2019, 08/07/2020, 01/21/2021, 07/30/2021   Pfizer(Comirnaty)Fall Seasonal Vaccine 12 years and older 07/01/2022   Pneumococcal Conjugate-13 10/23/2015   Pneumococcal Polysaccharide-23 04/19/2014, 12/30/2020   Td 05/30/2012   Zoster Recombinant(Shingrix) 10/21/2021, 12/23/2021   Zoster, Live 07/13/2015    TDAP status: Due, Education has been provided regarding the importance of this vaccine. Advised may receive this vaccine at local pharmacy or Health Dept. Aware to provide a copy of the vaccination record if obtained from local pharmacy or Health Dept. Verbalized acceptance and understanding.  Flu Vaccine status: Up to date  Pneumococcal vaccine status: Up to date  Covid-19 vaccine status: Information provided on how to obtain vaccines.   Qualifies for Shingles Vaccine? Yes   Zostavax completed No   Shingrix Completed?: Yes  Screening  Tests Health Maintenance  Topic Date Due   DTaP/Tdap/Td (2 - Tdap) 05/30/2022   COVID-19 Vaccine (8 - 2024-25 season) 09/17/2023   OPHTHALMOLOGY EXAM  12/08/2023   HEMOGLOBIN A1C  12/21/2023   FOOT EXAM  01/07/2024   Medicare Annual Wellness (AWV)  12/19/2024   Pneumonia Vaccine 35+ Years old  Completed   INFLUENZA VACCINE  Completed   DEXA SCAN  Completed   Zoster Vaccines- Shingrix  Completed   HPV VACCINES  Aged Out    Health Maintenance  Health Maintenance Due  Topic Date Due   DTaP/Tdap/Td (2 - Tdap) 05/30/2022   COVID-19 Vaccine (8 - 2024-25 season) 09/17/2023   OPHTHALMOLOGY EXAM  12/08/2023    Colorectal cancer screening: No longer required.   Mammogram status: No longer required due to due to age .  Bone Density status: Ordered N/A . Pt provided with contact info and advised to call to schedule appt.  Lung Cancer Screening: (Low Dose CT Chest recommended if Age 17-80 years, 20 pack-year currently smoking OR have quit w/in 15years.) does not qualify.   Lung Cancer Screening Referral: No   Additional Screening:  Hepatitis C Screening: does not qualify; Completed N/A   Vision Screening: Recommended annual ophthalmology exams for early detection of glaucoma and other disorders of the eye. Is the patient up to date with their annual eye exam?  Yes  Who is the provider or what is the name of the office in which the patient attends annual eye exams? Dr.Groat  If pt is not established with a provider, would they like to be referred to a provider to establish care? No .   Dental Screening: Recommended annual dental exams for proper oral hygiene  Diabetic Foot Exam: Diabetic Foot Exam: Completed N/A   Community Resource Referral / Chronic Care Management: CRR required this visit?  No   CCM required this visit?  No     Plan:     I have personally reviewed and noted the following in the patient's chart:   Medical and social history Use of alcohol, tobacco or  illicit drugs  Current medications and supplements including opioid prescriptions. Patient is not currently taking opioid prescriptions. Functional ability and status Nutritional status Physical activity Advanced directives List of other physicians Hospitalizations, surgeries, and ER visits in previous 12 months Vitals Screenings to include cognitive, depression, and falls Referrals and appointments  In addition, I have reviewed  and discussed with patient certain preventive protocols, quality metrics, and best practice recommendations. A written personalized care plan for preventive services as well as general preventive health recommendations were provided to patient.    Caesar Bookman, NP   12/20/2023   After Visit Summary: (MyChart) Due to this being a telephonic visit, the after visit summary with patients personalized plan was offered to patient via MyChart   Nurse Notes: Advised to get Tdap vaccine at the pharmacy.Has card for COVID-19 vaccine POA will bring to the office then update chart.   I connected with  Ashlee Player Kruk on 12/20/23 by a video enabled telemedicine application and verified that I am speaking with the correct person using two identifiers.   I discussed the limitations of evaluation and management by telemedicine. The patient expressed understanding and agreed to proceed.   Spent 25 minutes of Face to face with patient  >50% time spent counseling; reviewing medical record; tests; labs;AWV and developing future plan of care.

## 2023-12-20 NOTE — Telephone Encounter (Signed)
 Pt c/o medication issue:  1. Name of Medication:   Plavix  2. How are you currently taking this medication (dosage and times per day)?   3. Are you having a reaction (difficulty breathing--STAT)?   4. What is your medication issue?   Daughter [name]) wants a call back to discuss patient wanting a medication change from this medication.

## 2023-12-20 NOTE — Telephone Encounter (Signed)
 Briana [name] [phone]   Pts daughter called to ask why we do not just put her on the Eliquis if we think she may have had or having Afib. I advised them that without evidence we could not prescribe it without knowing the benefit/ risk.... they verbalized understanding... we went over the benefit of a Loop per their request...they will talk with Neurology further and let us know if they decide to have it put in and if so they will let Dr Lalla Brothers know.

## 2023-12-21 DIAGNOSIS — Z9181 History of falling: Secondary | ICD-10-CM

## 2023-12-21 DIAGNOSIS — N1832 Chronic kidney disease, stage 3b: Secondary | ICD-10-CM | POA: Diagnosis not present

## 2023-12-21 DIAGNOSIS — Z556 Problems related to health literacy: Secondary | ICD-10-CM

## 2023-12-21 DIAGNOSIS — M5430 Sciatica, unspecified side: Secondary | ICD-10-CM | POA: Diagnosis not present

## 2023-12-21 DIAGNOSIS — R2689 Other abnormalities of gait and mobility: Secondary | ICD-10-CM | POA: Diagnosis not present

## 2023-12-21 DIAGNOSIS — F5101 Primary insomnia: Secondary | ICD-10-CM

## 2023-12-21 DIAGNOSIS — R413 Other amnesia: Secondary | ICD-10-CM

## 2023-12-21 DIAGNOSIS — I129 Hypertensive chronic kidney disease with stage 1 through stage 4 chronic kidney disease, or unspecified chronic kidney disease: Secondary | ICD-10-CM | POA: Diagnosis not present

## 2023-12-21 DIAGNOSIS — M059 Rheumatoid arthritis with rheumatoid factor, unspecified: Secondary | ICD-10-CM | POA: Diagnosis not present

## 2023-12-21 DIAGNOSIS — Z96653 Presence of artificial knee joint, bilateral: Secondary | ICD-10-CM

## 2023-12-21 DIAGNOSIS — R21 Rash and other nonspecific skin eruption: Secondary | ICD-10-CM

## 2023-12-21 DIAGNOSIS — I69398 Other sequelae of cerebral infarction: Secondary | ICD-10-CM | POA: Diagnosis not present

## 2023-12-21 DIAGNOSIS — H8113 Benign paroxysmal vertigo, bilateral: Secondary | ICD-10-CM | POA: Diagnosis not present

## 2023-12-21 DIAGNOSIS — M5416 Radiculopathy, lumbar region: Secondary | ICD-10-CM | POA: Diagnosis not present

## 2023-12-21 DIAGNOSIS — E1122 Type 2 diabetes mellitus with diabetic chronic kidney disease: Secondary | ICD-10-CM | POA: Diagnosis not present

## 2023-12-21 DIAGNOSIS — J302 Other seasonal allergic rhinitis: Secondary | ICD-10-CM

## 2023-12-21 DIAGNOSIS — E785 Hyperlipidemia, unspecified: Secondary | ICD-10-CM

## 2023-12-21 DIAGNOSIS — Z7902 Long term (current) use of antithrombotics/antiplatelets: Secondary | ICD-10-CM

## 2023-12-22 DIAGNOSIS — I69398 Other sequelae of cerebral infarction: Secondary | ICD-10-CM | POA: Diagnosis not present

## 2023-12-22 DIAGNOSIS — M5416 Radiculopathy, lumbar region: Secondary | ICD-10-CM | POA: Diagnosis not present

## 2023-12-22 DIAGNOSIS — N1832 Chronic kidney disease, stage 3b: Secondary | ICD-10-CM | POA: Diagnosis not present

## 2023-12-22 DIAGNOSIS — E1122 Type 2 diabetes mellitus with diabetic chronic kidney disease: Secondary | ICD-10-CM | POA: Diagnosis not present

## 2023-12-22 DIAGNOSIS — R2689 Other abnormalities of gait and mobility: Secondary | ICD-10-CM | POA: Diagnosis not present

## 2023-12-22 DIAGNOSIS — M059 Rheumatoid arthritis with rheumatoid factor, unspecified: Secondary | ICD-10-CM | POA: Diagnosis not present

## 2023-12-22 DIAGNOSIS — H8113 Benign paroxysmal vertigo, bilateral: Secondary | ICD-10-CM | POA: Diagnosis not present

## 2023-12-22 DIAGNOSIS — I129 Hypertensive chronic kidney disease with stage 1 through stage 4 chronic kidney disease, or unspecified chronic kidney disease: Secondary | ICD-10-CM | POA: Diagnosis not present

## 2023-12-22 DIAGNOSIS — M5430 Sciatica, unspecified side: Secondary | ICD-10-CM | POA: Diagnosis not present

## 2023-12-29 DIAGNOSIS — M5416 Radiculopathy, lumbar region: Secondary | ICD-10-CM | POA: Diagnosis not present

## 2023-12-29 DIAGNOSIS — M5430 Sciatica, unspecified side: Secondary | ICD-10-CM | POA: Diagnosis not present

## 2023-12-29 DIAGNOSIS — R2689 Other abnormalities of gait and mobility: Secondary | ICD-10-CM | POA: Diagnosis not present

## 2023-12-29 DIAGNOSIS — H8113 Benign paroxysmal vertigo, bilateral: Secondary | ICD-10-CM | POA: Diagnosis not present

## 2023-12-29 DIAGNOSIS — M059 Rheumatoid arthritis with rheumatoid factor, unspecified: Secondary | ICD-10-CM | POA: Diagnosis not present

## 2023-12-29 DIAGNOSIS — E1122 Type 2 diabetes mellitus with diabetic chronic kidney disease: Secondary | ICD-10-CM | POA: Diagnosis not present

## 2023-12-29 DIAGNOSIS — I129 Hypertensive chronic kidney disease with stage 1 through stage 4 chronic kidney disease, or unspecified chronic kidney disease: Secondary | ICD-10-CM | POA: Diagnosis not present

## 2023-12-29 DIAGNOSIS — I69398 Other sequelae of cerebral infarction: Secondary | ICD-10-CM | POA: Diagnosis not present

## 2023-12-29 DIAGNOSIS — N1832 Chronic kidney disease, stage 3b: Secondary | ICD-10-CM | POA: Diagnosis not present

## 2023-12-30 DIAGNOSIS — R9389 Abnormal findings on diagnostic imaging of other specified body structures: Secondary | ICD-10-CM | POA: Diagnosis not present

## 2023-12-30 DIAGNOSIS — D251 Intramural leiomyoma of uterus: Secondary | ICD-10-CM | POA: Diagnosis not present

## 2024-01-03 DIAGNOSIS — I129 Hypertensive chronic kidney disease with stage 1 through stage 4 chronic kidney disease, or unspecified chronic kidney disease: Secondary | ICD-10-CM | POA: Diagnosis not present

## 2024-01-03 DIAGNOSIS — N1832 Chronic kidney disease, stage 3b: Secondary | ICD-10-CM | POA: Diagnosis not present

## 2024-01-03 DIAGNOSIS — E1122 Type 2 diabetes mellitus with diabetic chronic kidney disease: Secondary | ICD-10-CM | POA: Diagnosis not present

## 2024-01-03 DIAGNOSIS — M5430 Sciatica, unspecified side: Secondary | ICD-10-CM | POA: Diagnosis not present

## 2024-01-03 DIAGNOSIS — I69398 Other sequelae of cerebral infarction: Secondary | ICD-10-CM | POA: Diagnosis not present

## 2024-01-03 DIAGNOSIS — M5416 Radiculopathy, lumbar region: Secondary | ICD-10-CM | POA: Diagnosis not present

## 2024-01-03 DIAGNOSIS — M059 Rheumatoid arthritis with rheumatoid factor, unspecified: Secondary | ICD-10-CM | POA: Diagnosis not present

## 2024-01-03 DIAGNOSIS — R2689 Other abnormalities of gait and mobility: Secondary | ICD-10-CM | POA: Diagnosis not present

## 2024-01-03 DIAGNOSIS — H8113 Benign paroxysmal vertigo, bilateral: Secondary | ICD-10-CM | POA: Diagnosis not present

## 2024-01-05 ENCOUNTER — Telehealth: Payer: Self-pay | Admitting: *Deleted

## 2024-01-05 NOTE — Telephone Encounter (Signed)
 Spoke with the patient regarding the referral to GYN oncology. Patient scheduled as new patient with Dr Alvester Morin on 4/14 at 10:30 am. Patient given an arrival time of 10 am.  Explained to the patient the the doctor will perform a pelvic exam at this visit. Patient given the policy that only one visitor allowed and that visitor must be over 16 yrs are allowed in the Cancer Center. Patient given the address/phone number for the clinic and that the center offers free valet service. Patient aware that masks required.

## 2024-01-06 ENCOUNTER — Telehealth: Payer: Self-pay

## 2024-01-06 NOTE — Telephone Encounter (Signed)
 See media tab to review document requesting a peer to peer

## 2024-01-06 NOTE — Telephone Encounter (Signed)
 Briana Sheffield, MD  You1 minute ago (4:02 PM)    I ordered home health for home physical therapy not a nurse visit.    Noted

## 2024-01-06 NOTE — Telephone Encounter (Signed)
 Briana Sheffield, MD  You; P[facility] Clinical1 hour ago (1:33 PM)    I saw her last in January, can you find out what exactly is this about ?   The peer to peer request explains what this is about and is located under the media tab (indexed from [place])         I called patients daughter and she stated patient is getting PT though [facility] and that is all she can think of, that this could be in reference to.

## 2024-01-07 ENCOUNTER — Other Ambulatory Visit: Payer: Self-pay | Admitting: Obstetrics and Gynecology

## 2024-01-07 DIAGNOSIS — N898 Other specified noninflammatory disorders of vagina: Secondary | ICD-10-CM

## 2024-01-12 DIAGNOSIS — M5416 Radiculopathy, lumbar region: Secondary | ICD-10-CM | POA: Diagnosis not present

## 2024-01-12 DIAGNOSIS — N1832 Chronic kidney disease, stage 3b: Secondary | ICD-10-CM | POA: Diagnosis not present

## 2024-01-12 DIAGNOSIS — M059 Rheumatoid arthritis with rheumatoid factor, unspecified: Secondary | ICD-10-CM | POA: Diagnosis not present

## 2024-01-12 DIAGNOSIS — H8113 Benign paroxysmal vertigo, bilateral: Secondary | ICD-10-CM | POA: Diagnosis not present

## 2024-01-12 DIAGNOSIS — I69398 Other sequelae of cerebral infarction: Secondary | ICD-10-CM | POA: Diagnosis not present

## 2024-01-12 DIAGNOSIS — E1122 Type 2 diabetes mellitus with diabetic chronic kidney disease: Secondary | ICD-10-CM | POA: Diagnosis not present

## 2024-01-12 DIAGNOSIS — R2689 Other abnormalities of gait and mobility: Secondary | ICD-10-CM | POA: Diagnosis not present

## 2024-01-12 DIAGNOSIS — I129 Hypertensive chronic kidney disease with stage 1 through stage 4 chronic kidney disease, or unspecified chronic kidney disease: Secondary | ICD-10-CM | POA: Diagnosis not present

## 2024-01-12 DIAGNOSIS — M5430 Sciatica, unspecified side: Secondary | ICD-10-CM | POA: Diagnosis not present

## 2024-01-20 ENCOUNTER — Ambulatory Visit: Admitting: Obstetrics and Gynecology

## 2024-01-24 ENCOUNTER — Other Ambulatory Visit: Payer: Self-pay

## 2024-01-24 ENCOUNTER — Encounter: Payer: Self-pay | Admitting: Psychiatry

## 2024-01-24 ENCOUNTER — Telehealth: Payer: Self-pay | Admitting: *Deleted

## 2024-01-24 ENCOUNTER — Inpatient Hospital Stay: Attending: Psychiatry | Admitting: Psychiatry

## 2024-01-24 VITALS — BP 142/64 | HR 54 | Temp 98.1°F | Resp 18 | Wt 149.2 lb

## 2024-01-24 DIAGNOSIS — Z8673 Personal history of transient ischemic attack (TIA), and cerebral infarction without residual deficits: Secondary | ICD-10-CM | POA: Diagnosis not present

## 2024-01-24 DIAGNOSIS — Z7901 Long term (current) use of anticoagulants: Secondary | ICD-10-CM | POA: Insufficient documentation

## 2024-01-24 DIAGNOSIS — Z8041 Family history of malignant neoplasm of ovary: Secondary | ICD-10-CM | POA: Diagnosis not present

## 2024-01-24 DIAGNOSIS — Z803 Family history of malignant neoplasm of breast: Secondary | ICD-10-CM | POA: Insufficient documentation

## 2024-01-24 DIAGNOSIS — N898 Other specified noninflammatory disorders of vagina: Secondary | ICD-10-CM

## 2024-01-24 DIAGNOSIS — E7800 Pure hypercholesterolemia, unspecified: Secondary | ICD-10-CM | POA: Insufficient documentation

## 2024-01-24 DIAGNOSIS — Z79899 Other long term (current) drug therapy: Secondary | ICD-10-CM | POA: Diagnosis not present

## 2024-01-24 DIAGNOSIS — Z801 Family history of malignant neoplasm of trachea, bronchus and lung: Secondary | ICD-10-CM | POA: Insufficient documentation

## 2024-01-24 DIAGNOSIS — I10 Essential (primary) hypertension: Secondary | ICD-10-CM | POA: Insufficient documentation

## 2024-01-24 DIAGNOSIS — D398 Neoplasm of uncertain behavior of other specified female genital organs: Secondary | ICD-10-CM | POA: Insufficient documentation

## 2024-01-24 DIAGNOSIS — M1990 Unspecified osteoarthritis, unspecified site: Secondary | ICD-10-CM | POA: Insufficient documentation

## 2024-01-24 NOTE — Patient Instructions (Signed)
 It was a pleasure to see you in clinic today. - We discussed getting the MRI and then following up with me after to review results and next steps as indicated. - Recommend reaching out to your PCP regarding the status of your GI referral  Thank you very much for allowing me to provide care for you today.  I appreciate your confidence in choosing our Gynecologic Oncology team at [facility].  If you have any questions about your visit today please call our office or send us  a MyChart message and we will get back to you as soon as possible.

## 2024-01-24 NOTE — Progress Notes (Signed)
 GYNECOLOGIC ONCOLOGY NEW PATIENT CONSULTATION  Date of Service: 01/24/2024 Referring Provider: Merryl Abraham, MD   ASSESSMENT AND PLAN: Briana [name] is a 89 y.o. woman with 4.6cm cystic vaginal lesion noted incidentally on CT scan.  Reviewed CT scan imaging findings.  Subsequent ultrasound with OB/GYN without findings to correlate with CT findings.  Exam repeated today.  Benign speculum, bimanual, and rectovaginal exam.  No findings on exam to explain CT imaging findings.  Recommend additional evaluation with pelvic MRI.  Unclear if this cystic lesion is located instead in the posterior cul-de-sac/rectovaginal septum area and may be better identified on MRI.  But no clear expansion of the rectovaginal septum palpated on exam today.  Reviewed that her findings are overall reassuring.  Will have patient follow-up after MRI is complete to discuss results and next steps as indicated.  Otherwise, recommend that patient follow-up with PCP regarding status of GI referral that appears to have been previously placed.  A copy of this note was sent to the patient's referring provider.  Derrel Flies, MD Gynecologic Oncology   Medical Decision Making I personally spent  TOTAL 45 minutes face-to-face and non-face-to-face in the care of this patient, which includes all pre, intra, and post visit time on the date of service.   ------------  CC: Vaginal cyst  HISTORY OF PRESENT ILLNESS:  Briana [name] is a 89 y.o. woman who is seen in consultation at the request of Merryl Abraham, MD for evaluation of incidental cystic lesion in the vagina noted on CT scan.  Patient was evaluated by her internal medicine provider on 11/09/2023.  At that time she noted chronic nausea, worse with the eating.  As a result she was planned to be referred to GI and ordered for CT abdomen/pelvis.  She underwent CT scan on 12/07/2023 which noted a cystic appearing lesion in the vagina measuring 4.6 x 2 x 3.6 cm,  possible hematocolpos.  There is also noted an ill-defined area of decreased attenuation in the left lateral fundal region measuring 2.2 x 1.8 cm that could represent a degenerating fibroid.  She was then sent to OB/GYN for further evaluation and was seen on 12/30/2023.  On ultrasound there was note of a 2.4 cm intramural fibroid.  No adnexal masses or free fluid.  No fluid noted within the endometrium.  And no definite cystic area seen within the vagina.  Due to this discrepancy, she was recommended to follow-up with GYN oncology.  Today patient presents with her daughter.  She denies vaginal bleeding, vaginal discharge, or pelvic pain.  She notes her primary symptom is nausea that she is experienced ever since her stroke in September 2024.  Her stroke symptoms included blurred vision and dizziness.  She has residual poor vision in both eyes.  Prior to the stroke she had poor vision in one of her eyes due to glaucoma, but now both eyes have poor vision.  Patient does report a history of a sister with ovarian cancer and a son with breast cancer.  During the visit, the daughter accompanying the patient texted the son with breast cancer who reports he had negative germline genetic testing.   PAST MEDICAL HISTORY: Past Medical History:  Diagnosis Date   ACUTE URIS OF UNSPECIFIED SITE 11/26/2008   ALLERGIC ARTHRITIS OTHER SPECIFIED SITES 10/22/2010   OA + RA   ALLERGIC RHINITIS 10/28/2007   ARTHRITIS, KNEES, BILATERAL 10/28/2007   BACK PAIN 04/12/2009   BUNIONECTOMY, HX OF 10/28/2007   CELLULITIS&ABSCESS OF HAND  EXCEPT FINGERS&THUMB 10/23/2010   Complication of anesthesia    itching and makes me crazy   Family history of anesthesia complication    DAUGHTER HAD HIVES AND " WENT WILD "   HYPERLIPIDEMIA 10/28/2007   HYPERTENSION 10/28/2007   INSOMNIA UNSPECIFIED 10/31/2007   MYOSITIS 10/22/2010   NECK PAIN 12/03/2008   Pain in joint, site unspecified 10/07/2010   Stroke (HCC) 06/23/2023   WRIST  PAIN, LEFT 10/22/2010    PAST SURGICAL HISTORY: Past Surgical History:  Procedure Laterality Date   arthroscopic surgery and rotator cuff repair left shoulder     arthroscopy right knee hx of surgery     BUNIONECTOMY     hx of   FOOT SURGERY     RIGHT   hand surgery for broken finger, remote     JOINT REPLACEMENT Right    knee   TOTAL KNEE ARTHROPLASTY  07/01/2012   Procedure: TOTAL KNEE ARTHROPLASTY;  Surgeon: Thera Flake., MD;  Location: [facility] OR;  Service: Orthopedics;  Laterality: Right;  right total knee arthroplasty   TOTAL KNEE ARTHROPLASTY Left 06/13/2014   dr Eulah Pont   TOTAL KNEE ARTHROPLASTY Left 06/13/2014   Procedure: TOTAL KNEE ARTHROPLASTY;  Surgeon: Loreta Ave, MD;  Location: [facility] OR;  Service: Orthopedics;  Laterality: Left;    OB/GYN HISTORY: OB History  Gravida Para Term Preterm AB Living  5 5 5   5   SAB IAB Ectopic Multiple Live Births      5    # Outcome Date GA Lbr Len/2nd Weight Sex Type Anes PTL Lv  5 Term      Vag-Spont   LIV  4 Term      Vag-Spont   LIV  3 Term      Vag-Spont   LIV  2 Term      Vag-Spont   LIV  1 Term      Vag-Spont   LIV      Age at menarche: 37 Age at menopause: unsure Hx of HRT: no Hx of STI: no Last pap: unsure History of abnormal pap smears: no  SCREENING STUDIES:  Last mammogram: 06/2022 Last colonoscopy: 2010  MEDICATIONS:  Current Outpatient Medications:    acetaminophen (TYLENOL) 650 MG CR tablet, Take 650 mg by mouth every 8 (eight) hours as needed for pain., Disp: , Rfl:    amLODipine (NORVASC) 10 MG tablet, Take 1 tablet (10 mg total) by mouth daily., Disp: 90 tablet, Rfl: 1   atenolol (TENORMIN) 100 MG tablet, Take 1 tablet (100 mg total) by mouth daily., Disp: 90 tablet, Rfl: 1   b complex vitamins tablet, Take 1 tablet by mouth daily., Disp: , Rfl:    Cholecalciferol (VITAMIN D) 2000 UNITS tablet, Take 2,000 Units by mouth daily., Disp: , Rfl:    clopidogrel (PLAVIX) 75 MG tablet, Take 1 tablet (75 mg  total) by mouth daily., Disp: 90 tablet, Rfl: 3   cyanocobalamin (VITAMIN B12) 1000 MCG tablet, Take 1,000 mcg by mouth daily., Disp: , Rfl:    dorzolamide-timolol (COSOPT) 22.3-6.8 MG/ML ophthalmic solution, Place 1 drop into both eyes 2 (two) times daily., Disp: , Rfl:    fexofenadine (ALLEGRA) 180 MG tablet, Take 180 mg by mouth daily., Disp: , Rfl:    hydroxychloroquine (PLAQUENIL) 200 MG tablet, Take 200 mg by mouth 2 (two) times daily., Disp: , Rfl:    latanoprost (XALATAN) 0.005 % ophthalmic solution, Place 1 drop into both eyes at bedtime., Disp: , Rfl:  Netarsudil Dimesylate (RHOPRESSA) 0.02 % SOLN, Place 1 drop into the right eye at bedtime., Disp: , Rfl:    pantoprazole (PROTONIX) 40 MG tablet, Take 1 tablet (40 mg total) by mouth 2 (two) times daily., Disp: 180 tablet, Rfl: 1   polyethylene glycol powder (GLYCOLAX/MIRALAX) 17 GM/SCOOP powder, Take 1 Container by mouth once., Disp: , Rfl:    atorvastatin (LIPITOR) 40 MG tablet, Take 1 tablet (40 mg total) by mouth daily., Disp: 90 tablet, Rfl: 3  ALLERGIES: Allergies  Allergen Reactions   Ibuprofen Anaphylaxis   Percocet [Oxycodone-Acetaminophen]     nausea   Simvastatin Other (See Comments)    muscle aches   2,4-D Dimethylamine Other (See Comments)   Diltiazem Hcl     REACTION: ANGIO EDEMA   Hydrochlorothiazide     REACTION: SWELLING   Lisinopril     REACTION: SWELLING   Valacyclovir Other (See Comments)   Verapamil     REACTION: SWELLING    FAMILY HISTORY: Family History  Problem Relation Age of Onset   Heart attack Father    Ovarian cancer Sister    Lung cancer Brother    High blood pressure Daughter    High blood pressure Son    Breast cancer Son    High blood pressure Son    High blood pressure Son    High blood pressure Son    Coronary artery disease Other    Heart attack Other    Colon cancer Neg Hx    Endometrial cancer Neg Hx     SOCIAL HISTORY: Social History   Socioeconomic History    Marital status: Widowed    Spouse name: Not on file   Number of children: 4   Years of education: 12   Highest education level: Not on file  Occupational History   Occupation: ASSN II 1    Employer: [employer]  Tobacco Use   Smoking status: Never   Smokeless tobacco: Never  Vaping Use   Vaping status: Never Used  Substance and Sexual Activity   Alcohol use: No   Drug use: No   Sexual activity: Not Currently  Other Topics Concern   Not on file  Social History Narrative   Married in [place] widowed on 004 sons, 1 daughter, 6 grandchildren, 3 great-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         Per [facility] [place] Packet Abstracted on 08/10/2023:      Diet: Blank      Caffeine: None      Married, if yes what year:  69      Do you live in a house, apartment, assisted living, condo, trailer, ect: House      Is it one or more stories: One      How many persons live in your home? 2      Pets: No pets      Highest level or education completed:  11th grade      Current/Past profession: Psychiatric nurse      Exercise:       yes           Type and how often: standard          Living Will: No   DNR: no   POA/HPOA: no      Functional Status:   Do you have difficulty bathing or dressing yourself? no   Do you have difficulty preparing  food or eating?Yes   Do you have difficulty managing your medications?Yes   Do you have difficulty managing your finances? ?   Do you have difficulty affording your medications? No      Social Drivers of Corporate investment banker Strain: Low Risk  (12/10/2022)   Overall Financial Resource Strain (CARDIA)    Difficulty of Paying Living Expenses: Not hard at all  Food Insecurity: No Food Insecurity (06/29/2023)   Hunger Vital Sign    Worried About Running Out of Food in the Last Year: Never true    Ran Out of Food in the Last Year: Never true  Transportation Needs: No  Transportation Needs (06/29/2023)   PRAPARE - Administrator, Civil Service (Medical): No    Lack of Transportation (Non-Medical): No  Physical Activity: Insufficiently Active (12/10/2022)   Exercise Vital Sign    Days of Exercise per Week: 7 days    Minutes of Exercise per Session: 10 min  Stress: No Stress Concern Present (12/10/2022)   [employer] of Occupational Health - Occupational Stress Questionnaire    Feeling of Stress : Only a little  Social Connections: Moderately Integrated (12/10/2022)   Social Connection and Isolation Panel [NHANES]    Frequency of Communication with Friends and Family: More than three times a week    Frequency of Social Gatherings with Friends and Family: More than three times a week    Attends Religious Services: More than 4 times per year    Active Member of [employer] or Organizations: Yes    Attends Banker Meetings: More than 4 times per year    Marital Status: Widowed  Intimate Partner Violence: Not At Risk (06/24/2023)   Humiliation, Afraid, Rape, and Kick questionnaire    Fear of Current or Ex-Partner: No    Emotionally Abused: No    Physically Abused: No    Sexually Abused: No    REVIEW OF SYSTEMS: New patient intake form was reviewed.  Complete 10-system review is negative except for the following: vision problems  PHYSICAL EXAM: BP (!) 154/54 (BP Location: Right Arm, Patient Position: Sitting) Comment: 1st BP 154/58; 2nd BP 154/54;  Pulse (!) 54   Temp 98.1 F (36.7 C) (Oral)   Resp 18   Wt 149 lb 3.2 oz (67.7 kg)   SpO2 99%   BMI 25.61 kg/m  Constitutional: No acute distress. Neuro/Psych: Alert, oriented.  Head and Neck: Normocephalic, atraumatic. Neck symmetric without masses. Sclera anicteric.  Respiratory: Normal work of breathing. Clear to auscultation bilaterally. Cardiovascular: Regular rate and rhythm, no murmurs, rubs, or gallops. Abdomen: Normoactive bowel sounds. Soft, non-distended, non-tender to  palpation. No masses appreciated.  Extremities: Grossly normal range of motion. Warm, well perfused. No edema bilaterally. Skin: No rashes or lesions. Lymphatic: No cervical, supraclavicular, or inguinal adenopathy. Genitourinary: External genitalia without lesions. Urethral meatus without lesions or prolapse. On speculum exam, vagina and cervix without lesions. No cystic abnormality identified Bimanual exam reveals flush normal cervix and small uterus, no adnexal mass. Rectovaginal exam confirms the above findings and reveals normal sphincter tone and smooth rectovaginal septum. Exam chaperoned by Vira Grieves, NP  LABORATORY AND RADIOLOGIC DATA: Outside medical records were reviewed to synthesize the above history, along with the history and physical obtained during the visit.  Outside laboratory, and imaging reports were reviewed, with pertinent results below.  I personally reviewed the outside images.  WBC  Date Value Ref Range Status  09/19/2023 8.7 4.0 - 10.5 K/uL  Final   Hemoglobin  Date Value Ref Range Status  09/19/2023 13.4 12.0 - 15.0 g/dL Final   HCT  Date Value Ref Range Status  09/19/2023 39.4 36.0 - 46.0 % Final   Platelets  Date Value Ref Range Status  09/19/2023 199 150 - 400 K/uL Final   Magnesium  Date Value Ref Range Status  06/23/2023 1.9 1.7 - 2.4 mg/dL Final    Comment:    Performed at [facility], [address], [place], [place] [address]   Creat  Date Value Ref Range Status  09/15/2023 1.04 (H) 0.60 - 0.95 mg/dL Final   Creatinine, Ser  Date Value Ref Range Status  09/19/2023 1.07 (H) 0.44 - 1.00 mg/dL Final   AST  Date Value Ref Range Status  09/15/2023 25 10 - 35 U/L Final   ALT  Date Value Ref Range Status  09/15/2023 30 (H) 6 - 29 U/L Final    CT ABDOMEN PELVIS W CONTRAST 12/07/2023  Narrative CLINICAL DATA:  Nausea and vomiting  EXAM: CT ABDOMEN AND PELVIS WITH CONTRAST  TECHNIQUE: Multidetector CT imaging of the abdomen  and pelvis was performed using the standard protocol following bolus administration of intravenous contrast.  RADIATION DOSE REDUCTION: This exam was performed according to the departmental dose-optimization program which includes automated exposure control, adjustment of the mA and/or kV according to patient size and/or use of iterative reconstruction technique.  CONTRAST:  100mL ISOVUE-300 IOPAMIDOL (ISOVUE-300) INJECTION 61%  COMPARISON:  None Available.  FINDINGS: Lower chest: Calcification of the annulus of the mitral valve  Hepatobiliary: No focal liver abnormality is seen. No gallstones, gallbladder wall thickening, or biliary dilatation.  Pancreas: Unremarkable. No pancreatic ductal dilatation or surrounding inflammatory changes. Some vascular calcifications adjacent to the tail of the pancreas  Spleen: Normal in size without focal abnormality. Small calcified granulomata of no clinical significance  Adrenals/Urinary Tract: Adrenal glands are unremarkable. Kidneys are normal, without renal calculi, focal lesion, or hydronephrosis. Bladder is unremarkable.  Stomach/Bowel: Stomach is within normal limits. Appendix appears normal. No evidence of bowel wall thickening, distention, or inflammatory changes.  Vascular/Lymphatic: No significant vascular findings are present. No enlarged abdominal or pelvic lymph nodes.  Reproductive: Small anteverted uterus. Cystic appearing lesion is noted within the vagina measuring 4.6 x 2 x 3.6 cm in the longitudinal, AP and transverse diameter. Hematocolpos? Correlate clinically.  The uterus demonstrates ill-defined area of decreased attenuation in the left lateral fundal region of 2.2 x 1.8 cm that could correlate with fibroid degeneration.  Other: No abdominal wall hernia or abnormality. No abdominopelvic ascites.  Musculoskeletal: No acute or significant osseous findings.  IMPRESSION: Cystic appearing lesion within the  vagina measuring 4.6 x 2 x 3.6 cm in the longitudinal, AP and transverse diameter. Hematocolpos? The uterus demonstrates ill-defined area of decreased attenuation in the left lateral fundal region of 2.2 x 1.8 cm that could correlate with fibroid degeneration.  Otherwise unremarkable CT abdomen and pelvis with IV and oral contrast.   Electronically Signed By: Fredrich Jefferson M.D. On: 12/07/2023 14:54

## 2024-01-24 NOTE — Telephone Encounter (Signed)
 Spoke with patient's daughter [name] who stated she won't be changing her mother's MRI appt. From [facility] to [facility] due to cost.  Advised [name] that [facility] was notified and they have an available appt. On Friday,  April 25 th at 0820 at the [facility] location and they will be calling with location information. [name] verbalized understanding and thanked the office for calling.

## 2024-02-04 ENCOUNTER — Ambulatory Visit
Admission: RE | Admit: 2024-02-04 | Discharge: 2024-02-04 | Disposition: A | Discharge status: Home / Self Care | Source: Ambulatory Visit | Attending: Obstetrics and Gynecology | Admitting: Obstetrics and Gynecology

## 2024-02-04 DIAGNOSIS — D251 Intramural leiomyoma of uterus: Secondary | ICD-10-CM | POA: Diagnosis not present

## 2024-02-04 DIAGNOSIS — N898 Other specified noninflammatory disorders of vagina: Secondary | ICD-10-CM

## 2024-02-04 MED ORDER — GADOPICLENOL 0.5 MMOL/ML IV SOLN
7.0000 mL | Freq: Once | INTRAVENOUS | Status: AC | PRN
  Administered 2024-02-04: 7 mL via INTRAVENOUS

## 2024-02-09 ENCOUNTER — Ambulatory Visit (INDEPENDENT_AMBULATORY_CARE_PROVIDER_SITE_OTHER): Payer: Medicare HMO | Admitting: Neurology

## 2024-02-09 ENCOUNTER — Ambulatory Visit: Payer: Self-pay

## 2024-02-09 ENCOUNTER — Encounter: Payer: Self-pay | Admitting: Neurology

## 2024-02-09 VITALS — BP 134/78 | HR 80 | Ht 63.0 in | Wt 149.0 lb

## 2024-02-09 DIAGNOSIS — I10 Essential (primary) hypertension: Secondary | ICD-10-CM | POA: Diagnosis not present

## 2024-02-09 DIAGNOSIS — R110 Nausea: Secondary | ICD-10-CM | POA: Insufficient documentation

## 2024-02-09 DIAGNOSIS — I639 Cerebral infarction, unspecified: Secondary | ICD-10-CM

## 2024-02-09 NOTE — Telephone Encounter (Signed)
 Copied from CRM [id number]. Topic: Clinical - Red Word Triage >> Feb 09, 2024 10:48 AM Arlie Benedict B wrote: [facility] that prompted transfer to Nurse Triage: Patient's daughter [name] is on the line, she states that her mother is dizzy nauseous and having like a stutter when trying to speak. She states she has a neurology appt today but she is very worried.   Chief Complaint: Dizzy, generalized sickness Symptoms: dizzy, nausea Frequency: comes and goes Pertinent Negatives: Patient denies injuries, fall, hitting her head Disposition: [] ED /[] Urgent Care (no appt availability in office) / [] Appointment(In office/virtual)/ []  [facility]/ [] Home Care/ [x] Refused Recommended Disposition /[] [facility]/ []  Follow-up with PCP Additional Notes: Patient's daughter called for her mother (the patient) who has been feeling dizzy, feeling like she could be sick,  Daughter states that the patient doesn't look like she feels good.  She feels sick and dizzy. Patient is on Plavix  per daughter but they deny any recent falls or injuries or the patient hitting her head. Daughter states that the patient does not feel like the room is spinning. Daughter said that the patient had an issue at one point where her blood sugar was an issue. This RN asked if they were able to check her blood sugar or if she has checked it lately, which they declined checking it or being able to check it. The patient states at one point that she had a stroke before and this RN advised that if she is worried about any possibility of a stroke, going to the Emergency Room would be strongly advised at this time. Patient is stuttering on the phone and the daughter states that it is not her normal speech.  Patient states she has always stuttered then the daughter advised that it has started some as the patient has gotten older. This RN offered to call an ambulance and the daughter refused. Patient states that she does not want to go  to the hospital. She said she just had an episode and was feeling better since she called her daughter. Daughter states that she cannot get the patient to go to the hospital and they have an appointment at 2:15 pm today that they will keep but if the patient gets worse they will try to get her to the hospital.    Reason for Disposition . Patient sounds very sick or weak to the triager  Answer Assessment - Initial Assessment Questions 1. DESCRIPTION: "Describe your dizziness."     Dizzy nausea 2. LIGHTHEADED: "Do you feel lightheaded?" (e.g., somewhat faint, woozy, weak upon standing)     Nausea and dizzy 3. VERTIGO: "Do you feel like either you or the room is spinning or tilting?" (i.e. vertigo)     No spinning 4. SEVERITY: "How bad is it?"  "Do you feel like you are going to faint?" "Can you stand and walk?"   - MILD: Feels slightly dizzy, but walking normally.   - MODERATE: Feels unsteady when walking, but not falling; interferes with normal activities (e.g., school, work).   - SEVERE: Unable to walk without falling, or requires assistance to walk without falling; feels like passing out now.      --- 5. ONSET:  "When did the dizziness begin?"     Prior to triage 6. AGGRAVATING FACTORS: "Does anything make it worse?" (e.g., standing, change in head position)     --- 7. HEART RATE: "Can you tell me your heart rate?" "How many beats in 15 seconds?"  (  Note: not all patients can do this)       ---- 8. CAUSE: "What do you think is causing the dizziness?"     --- 9. RECURRENT SYMPTOM: "Have you had dizziness before?" If Yes, ask: "When was the last time?" "What happened that time?"     ---- 10. OTHER SYMPTOMS: "Do you have any other symptoms?" (e.g., fever, chest pain, vomiting, diarrhea, bleeding)       Generalized malaise  Protocols used: Dizziness - Lightheadedness-A-AH

## 2024-02-09 NOTE — Patient Instructions (Addendum)
 Follow up with PCP about nausea, you have been referred to GI Continue with Plavix  Strict management of vascular risk factors with a goal BP less than 130/90, A1c less than 7.0, LDL less than 70 for secondary stroke prevention Return here as needed

## 2024-02-09 NOTE — Telephone Encounter (Signed)
 Patient daughter states that she's feeling a little better. But she plans on taking her to Dr appointment this afternoon. She states with her not feeling well she thinks she needs her blood sugar checked. She asked me if the pharmacy can do this. I told her that I'm not sure if they can but Dr.V has recommended that she take her to the hospital. She states that she will call us  back later and let us  know how she's doing and if she can find someone to check her blood sugar.

## 2024-02-09 NOTE — Telephone Encounter (Signed)
 This RN called the [facility] to advise them of ED Refusal

## 2024-02-09 NOTE — Progress Notes (Signed)
 Patient: Briana [name] Date of Birth: 10-24-1930  Reason for Visit: Stroke clinic follow up  History from: Patient, daughter Briana [name] Primary Neurologist: Janett Medin  ASSESSMENT AND PLAN 89 y.o. year old female presented with dizziness and blurry vision.  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  On Plavix  for secondary stroke prevention as monotherapy, allergy  to aspirin .  Echo showed severe left atrial dilation.  Vascular risk factors: HTN, HLD. Left homonymous hemianopsia noted, mild dysarthria.  30-day cardiac monitor was unremarkable.  No evidence of A-fib.  Patient saw Dr. Marven Slimmer cardiology EP, they discussed loop recorder. Patient ultimately decided not to proceed. Since stroke has complained of nausea.   - Follow-up with primary care about nausea, a referral has been placed to GI. Patient indicates she doesn't want any more imaging. Advised check BP, glucose when "feeling bad" -Continue Plavix  75 mg daily for secondary stroke prevention -She is not interested in a loop recorder. -Strict management of vascular risk factors with a goal BP less than 130/90, A1c less than 7.0, LDL less than 70 for secondary stroke prevention -Recommend continue exercise, healthy eating -Keep close follow-up with PCP, she does not wish to return here, will see for new or worsening symptoms   HISTORY OF PRESENT ILLNESS: Today 02/09/24 Here with daughter. Continues with good and bad days. Continues to just feel bad. Has been experiencing nausea, PCP ordered CT scan, questionable vaginal mass, further pelvis MRI showed everything resulted. Right eye glaucoma. Occasional headache, not often enough to complain, nothing major. Has decided to hold off on loop recorder with Dr. Marven Slimmer. Remains on Plavix . Her son lives with her. Using a walker. Remains very scared she is going to fall, cannot walker anywhere without the walker, speech  can be dysarthric especially when she doesn't feel well.  Has lost about 10 lbs since last seen. Main thing is "feeling bad" since her stroke.   07/27/23 SS: Here with her daughter, Briana [name]. Describes main concern for stroke was vertigo, not feeling too good, brain fog. Had COVID in July lead up to this. She never got cardiac monitor after discharge. She  lives with her son. 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 Left sided vision issues. 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 Denies any weakness to left sided. Using cane, she walked from the car into the building. At home she has a walker. She was using walker before stroke. Remains on Plavix . Speech is slightly dysarthric, mild stutter. In good spirits. On Lipitor 40. BP 144/72 on atenolol  + amlodipine . Daughter sets out medication. No concerns about memory.  HISTORY  Presented to the ER 06/23/23 with dizziness and blurry vision.  Few days prior she had been feeling off after being diagnosed with COVID.  Had been feeling off balance.  Found to have hemorrhagic right parietal infarct of white matter, subacute, questionable very small left cerebellar infarct versus CSF cyst.  Started on Plavix  75 mg daily for secondary stroke prevention as monotherapy, allergy  to aspirin .  Echo showed severe left atrial dilation.  Holter monitor for 30 days.  Concern for A-fib as etiology in setting of new finding of severe left atrial dilation  Code Stroke CT head: Age indeterminate infarcts of left cerebellar hemisphere and right parietal lobe (likely chronic. CTA head & neck: No LVO. 65% stenosis of proximal R ICA 2/2 bulky calcification.  MRI: Intermediate subacute/chronic R parietal infarct of white matter with nearby encephalomalacia  Carotid Doppler:  Bilateral no significant stenosis.   2D Echo: Left atrial dilation LDL 66 HgbA1c 6.0 VTE prophylaxis - None prior to admission. Will discharge on antiplatelet monotherapy with Plavix .  REVIEW OF SYSTEMS: Out of a complete 14 system  review of symptoms, the patient complains only of the following symptoms, and all other reviewed systems are negative.  See HPI  ALLERGIES: Allergies  Allergen Reactions   Ibuprofen  Anaphylaxis   Percocet [Oxycodone -Acetaminophen ]     nausea   Simvastatin  Other (See Comments)    muscle aches   2,4-D Dimethylamine Other (See Comments)   Diltiazem Hcl     REACTION: ANGIO EDEMA   Hydrochlorothiazide     REACTION: SWELLING   Lisinopril     REACTION: SWELLING   Valacyclovir  Other (See Comments)   Verapamil     REACTION: SWELLING    HOME MEDICATIONS: Outpatient Medications Prior to Visit  Medication Sig Dispense Refill   acetaminophen  (TYLENOL ) 650 MG CR tablet Take 650 mg by mouth every 8 (eight) hours as needed for pain.     amLODipine  (NORVASC ) 10 MG tablet Take 1 tablet (10 mg total) by mouth daily. 90 tablet 1   atenolol  (TENORMIN ) 100 MG tablet Take 1 tablet (100 mg total) by mouth daily. 90 tablet 1   b complex vitamins tablet Take 1 tablet by mouth daily.     Cholecalciferol (VITAMIN D) 2000 UNITS tablet Take 2,000 Units by mouth daily.     clopidogrel  (PLAVIX ) 75 MG tablet Take 1 tablet (75 mg total) by mouth daily. 90 tablet 3   cyanocobalamin (VITAMIN B12) 1000 MCG tablet Take 1,000 mcg by mouth daily.     dorzolamide -timolol  (COSOPT ) 22.3-6.8 MG/ML ophthalmic solution Place 1 drop into both eyes 2 (two) times daily.     fexofenadine (ALLEGRA) 180 MG tablet Take 180 mg by mouth daily.     hydroxychloroquine  (PLAQUENIL ) 200 MG tablet Take 200 mg by mouth 2 (two) times daily.     latanoprost  (XALATAN ) 0.005 % ophthalmic solution Place 1 drop into both eyes at bedtime.     Netarsudil  Dimesylate (RHOPRESSA ) 0.02 % SOLN Place 1 drop into the right eye at bedtime.     pantoprazole  (PROTONIX ) 40 MG tablet Take 1 tablet (40 mg total) by mouth 2 (two) times daily. 180 tablet 1   polyethylene glycol powder (GLYCOLAX/MIRALAX) 17 GM/SCOOP powder Take 1 Container by mouth once.      atorvastatin  (LIPITOR) 40 MG tablet Take 1 tablet (40 mg total) by mouth daily. 90 tablet 3   No facility-administered medications prior to visit.    PAST MEDICAL HISTORY: Past Medical History:  Diagnosis Date   ACUTE URIS OF UNSPECIFIED SITE 11/26/2008   ALLERGIC ARTHRITIS OTHER SPECIFIED SITES 10/22/2010   OA + RA   ALLERGIC RHINITIS 10/28/2007   ARTHRITIS, KNEES, BILATERAL 10/28/2007   BACK PAIN 04/12/2009   BUNIONECTOMY, HX OF 10/28/2007   CELLULITIS&ABSCESS OF HAND EXCEPT FINGERS&THUMB 10/23/2010   Complication of anesthesia    itching and makes me crazy   Family history of anesthesia complication    DAUGHTER HAD HIVES AND " WENT WILD "   HYPERLIPIDEMIA 10/28/2007   HYPERTENSION 10/28/2007   INSOMNIA UNSPECIFIED 10/31/2007   MYOSITIS 10/22/2010   NECK PAIN 12/03/2008   Pain in joint, site unspecified 10/07/2010   Stroke (HCC) 06/23/2023   WRIST PAIN, LEFT 10/22/2010    PAST SURGICAL HISTORY: Past Surgical History:  Procedure Laterality Date   arthroscopic surgery and rotator cuff repair left shoulder  arthroscopy right knee hx of surgery     BUNIONECTOMY     hx of   FOOT SURGERY     RIGHT   hand surgery for broken finger, remote     JOINT REPLACEMENT Right    knee   TOTAL KNEE ARTHROPLASTY  07/01/2012   Procedure: TOTAL KNEE ARTHROPLASTY;  Surgeon: Forbes Ida., MD;  Location: [facility] OR;  Service: Orthopedics;  Laterality: Right;  right total knee arthroplasty   TOTAL KNEE ARTHROPLASTY Left 06/13/2014   dr Abigail Abler   TOTAL KNEE ARTHROPLASTY Left 06/13/2014   Procedure: TOTAL KNEE ARTHROPLASTY;  Surgeon: Ferd Householder, MD;  Location: [facility] OR;  Service: Orthopedics;  Laterality: Left;    FAMILY HISTORY: Family History  Problem Relation Age of Onset   Heart attack Father    Ovarian cancer Sister    Lung cancer Brother    High blood pressure Daughter    High blood pressure Son    Breast cancer Son    High blood pressure Son    High blood pressure Son    High  blood pressure Son    Coronary artery disease Other    Heart attack Other    Colon cancer Neg Hx    Endometrial cancer Neg Hx     SOCIAL HISTORY: Social History   Socioeconomic History   Marital status: Widowed    Spouse name: Not on file   Number of children: 4   Years of education: 12   Highest education level: Not on file  Occupational History   Occupation: ASSN II 1    Employer: [employer]  Tobacco Use   Smoking status: Never   Smokeless tobacco: Never  Vaping Use   Vaping status: Never Used  Substance and Sexual Activity   Alcohol use: No   Drug use: No   Sexual activity: Not Currently  Other Topics Concern   Not on file  Social History Narrative   Married in [place] widowed on 004 sons, 1 daughter, 6 grandchildren, 3 great-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         Per [facility] [place] Packet Abstracted on 08/10/2023:      Diet: Blank      Caffeine: None      Married, if yes what year:  65      Do you live in a house, apartment, assisted living, condo, trailer, ect: House      Is it one or more stories: One      How many persons live in your home? 2      Pets: No pets      Highest level or education completed:  11th grade      Current/Past profession: Psychiatric nurse      Exercise:       yes           Type and how often: standard          Living Will: No   DNR: no   POA/HPOA: no      Functional Status:   Do you have difficulty bathing or dressing yourself? no   Do you have difficulty preparing food or eating?Yes   Do you have difficulty managing your medications?Yes   Do you have difficulty managing your finances? ?   Do you have difficulty affording your medications? No      Social Drivers of Dispensing optician  Resource Strain: Low Risk  (12/10/2022)   Overall Financial Resource Strain (CARDIA)    Difficulty of Paying Living Expenses: Not hard at all  Food  Insecurity: No Food Insecurity (06/29/2023)   Hunger Vital Sign    Worried About Running Out of Food in the Last Year: Never true    Ran Out of Food in the Last Year: Never true  Transportation Needs: No Transportation Needs (06/29/2023)   PRAPARE - Administrator, Civil Service (Medical): No    Lack of Transportation (Non-Medical): No  Physical Activity: Insufficiently Active (12/10/2022)   Exercise Vital Sign    Days of Exercise per Week: 7 days    Minutes of Exercise per Session: 10 min  Stress: No Stress Concern Present (12/10/2022)   [employer] of Occupational Health - Occupational Stress Questionnaire    Feeling of Stress : Only a little  Social Connections: Moderately Integrated (12/10/2022)   Social Connection and Isolation Panel [NHANES]    Frequency of Communication with Friends and Family: More than three times a week    Frequency of Social Gatherings with Friends and Family: More than three times a week    Attends Religious Services: More than 4 times per year    Active Member of [employer] or Organizations: Yes    Attends Banker Meetings: More than 4 times per year    Marital Status: Widowed  Intimate Partner Violence: Not At Risk (06/24/2023)   Humiliation, Afraid, Rape, and Kick questionnaire    Fear of Current or Ex-Partner: No    Emotionally Abused: No    Physically Abused: No    Sexually Abused: No   PHYSICAL EXAM  Vitals:   02/09/24 1418  BP: 134/78  Pulse: 80  Weight: 149 lb (67.6 kg)  Height: 5\' 3"  (1.6 m)   Body mass index is 26.39 kg/m.  Generalized: Well developed, in no acute distress, elderly female, seated in wheelchair Neurological examination  Mentation: Alert oriented to time, place, history taking. Follows all commands, speech is mildly dysarthric, few times noted stutter Cranial nerve II-XII: Pupils were equal round reactive to light.  Left homonymous hemianopsia.  Facial sensation and strength were normal.  Head  turning and shoulder shrug  were normal and symmetric. Motor: The motor testing reveals 5 over 5 strength of all 4 extremities. Good symmetric motor tone is noted throughout.  I do not see any weakness to the left side. Sensory: Reports sensation to the right and left side is normal, but appears some mild left-sided neglect Coordination: Mild dysmetria with left finger-nose-finger, left heel-to-shin. Gait and station: Gait is wide-based, cautious, short steps, requires examiner assistance.  DIAGNOSTIC DATA (LABS, IMAGING, TESTING) - I reviewed patient records, labs, notes, testing and imaging myself where available.  Lab Results  Component Value Date   WBC 8.7 09/19/2023   HGB 13.4 09/19/2023   HCT 39.4 09/19/2023   MCV 92.1 09/19/2023   PLT 199 09/19/2023      Component Value Date/Time   NA 140 09/19/2023 1333   NA 144 04/22/2017 0000   K 3.6 09/19/2023 1333   CL 104 09/19/2023 1333   CO2 29 09/19/2023 1333   GLUCOSE 115 (H) 09/19/2023 1333   BUN 14 09/19/2023 1333   BUN 20 04/22/2017 0000   CREATININE 1.07 (H) 09/19/2023 1333   CREATININE 1.04 (H) 09/15/2023 1101   CALCIUM  9.3 09/19/2023 1333   PROT 7.5 09/15/2023 1101   ALBUMIN 3.8 06/23/2023 2156   AST  25 09/15/2023 1101   ALT 30 (H) 09/15/2023 1101   ALKPHOS 114 06/23/2023 2156   BILITOT 1.3 (H) 09/15/2023 1101   GFRNONAA 49 (L) 09/19/2023 1333   GFRNONAA 54 (L) 05/29/2020 1043   GFRAA 63 05/29/2020 1043   Lab Results  Component Value Date   CHOL 176 06/24/2023   HDL 100 06/24/2023   LDLCALC 66 06/24/2023   LDLDIRECT 104.5 04/25/2010   TRIG 52 06/24/2023   CHOLHDL 1.8 06/24/2023   Lab Results  Component Value Date   HGBA1C 6.3 (H) 06/23/2023   Lab Results  Component Value Date   VITAMINB12 577 07/06/2014   Lab Results  Component Value Date   TSH 1.440 06/23/2023    Jeanmarie Millet, Maritza Sidles, DNP 02/09/2024, 2:28 PM [facility] [address], [address] [place], [place] [address] [phone]

## 2024-02-09 NOTE — Telephone Encounter (Addendum)
Message routed to PCP Venita Sheffield, MD

## 2024-02-09 NOTE — Progress Notes (Signed)
 I agree with the above plan

## 2024-02-09 NOTE — Telephone Encounter (Signed)
 Tye Gall, MD  [facility]33 minutes ago (11:18 AM)    Pt needs to go to ED

## 2024-02-10 ENCOUNTER — Encounter: Payer: Self-pay | Admitting: Internal Medicine

## 2024-02-17 ENCOUNTER — Telehealth: Payer: Self-pay

## 2024-02-17 NOTE — Telephone Encounter (Signed)
 Briana [name] and daughter are scheduled for a follow up phone visit with Dr.Newton on Monday 5/12 @ 4:15.

## 2024-02-17 NOTE — Telephone Encounter (Signed)
 Per Dr.Newton, I reached out to Briana [name] and her daughter, [name], regarding scheduling a follow up appointment following the MRI scan.   [name] states Dr.McCombs office called and told them the scan was not showing anything concerning and was confused as to why Dr, Daisey Dryer wanted to see them again? They are wanting to know if they could schedule a phone visit instead of coming into office? If an in person needs to be scheduled it would have to be after 6/16, per [facility] request.

## 2024-02-17 NOTE — Telephone Encounter (Signed)
 LVM for Pt's daughter to call office regarding setting up a phone visit with Dr.Newton

## 2024-02-21 ENCOUNTER — Encounter: Payer: Self-pay | Admitting: Psychiatry

## 2024-02-21 ENCOUNTER — Inpatient Hospital Stay: Attending: Psychiatry | Admitting: Psychiatry

## 2024-02-21 DIAGNOSIS — N898 Other specified noninflammatory disorders of vagina: Secondary | ICD-10-CM | POA: Diagnosis not present

## 2024-02-21 NOTE — Progress Notes (Signed)
 Gynecologic Oncology Telehealth Follow-Up Note  I connected with TIARIA [name] on 02/21/24 at  4:15 PM EDT by telephone and verified that I am speaking with the correct person using two identifiers.  I discussed the limitations, risks, security and privacy concerns of performing an evaluation and management service by telemedicine and the availability of in-person appointments. I also discussed with the patient that there may be a patient responsible charge related to this service. The patient expressed understanding and agreed to proceed.  Other persons participating in the visit and their role in the encounter: daughter.  Patient's location: Home, [place] Provider's location: [facility]   Date of Service: 02/21/2024 Referring Provider: Merryl Abraham, MD   Assessment & Plan: Briana [name] is a 89 y.o. woman with prior imaging concerning for a cystic vaginal lesion who presents for follow-up after pelvic MRI.   Summarized workup today.  Normal exam with me in office at last visit.  MRI results were reviewed and normal.  No cystic lesion to correlate with prior CT scan findings.  No adnexal mass seen.  Given normal MRI, do not recommend any additional workup.  Patient does not need ongoing follow-up with OB/GYN or GYN oncology.  Recommend continued annual follow-up with her PCP and referral back to OB/GYN if new concern for a pelvic symptom or finding.   RTC PRN.  Briana Flies, MD Gynecologic Oncology   Medical Decision Making I personally spent  TOTAL 10 minutes via phone in the care of this patient.     ----------------------- Reason for Visit: follow-up  Interval History: No changes since her last visit.  Again denies pelvic pain or pelvic concerns.  Reports that her nausea is every now and then but not all the time.  Feels that she is eating okay.   Past Medical/Surgical History: Past Medical History:  Diagnosis Date   ACUTE URIS OF UNSPECIFIED SITE 11/26/2008   ALLERGIC  ARTHRITIS OTHER SPECIFIED SITES 10/22/2010   OA + RA   ALLERGIC RHINITIS 10/28/2007   ARTHRITIS, KNEES, BILATERAL 10/28/2007   BACK PAIN 04/12/2009   BUNIONECTOMY, HX OF 10/28/2007   CELLULITIS&ABSCESS OF HAND EXCEPT FINGERS&THUMB 10/23/2010   Complication of anesthesia    itching and makes me crazy   Family history of anesthesia complication    DAUGHTER HAD HIVES AND " WENT WILD "   HYPERLIPIDEMIA 10/28/2007   HYPERTENSION 10/28/2007   INSOMNIA UNSPECIFIED 10/31/2007   MYOSITIS 10/22/2010   NECK PAIN 12/03/2008   Pain in joint, site unspecified 10/07/2010   Stroke (HCC) 06/23/2023   WRIST PAIN, LEFT 10/22/2010    Past Surgical History:  Procedure Laterality Date   arthroscopic surgery and rotator cuff repair left shoulder     arthroscopy right knee hx of surgery     BUNIONECTOMY     hx of   FOOT SURGERY     RIGHT   hand surgery for broken finger, remote     JOINT REPLACEMENT Right    knee   TOTAL KNEE ARTHROPLASTY  07/01/2012   Procedure: TOTAL KNEE ARTHROPLASTY;  Surgeon: Forbes Ida., MD;  Location: [facility] OR;  Service: Orthopedics;  Laterality: Right;  right total knee arthroplasty   TOTAL KNEE ARTHROPLASTY Left 06/13/2014   dr Abigail Abler   TOTAL KNEE ARTHROPLASTY Left 06/13/2014   Procedure: TOTAL KNEE ARTHROPLASTY;  Surgeon: Ferd Householder, MD;  Location: [facility] OR;  Service: Orthopedics;  Laterality: Left;    Family History  Problem Relation Age of Onset   Heart  attack Father    Ovarian cancer Sister    Lung cancer Brother    High blood pressure Daughter    High blood pressure Son    Breast cancer Son    High blood pressure Son    High blood pressure Son    High blood pressure Son    Coronary artery disease Other    Heart attack Other    Colon cancer Neg Hx    Endometrial cancer Neg Hx     Social History   Socioeconomic History   Marital status: Widowed    Spouse name: Not on file   Number of children: 4   Years of education: 12   Highest education level:  Not on file  Occupational History   Occupation: ASSN II 1    Employer: [employer]  Tobacco Use   Smoking status: Never   Smokeless tobacco: Never  Vaping Use   Vaping status: Never Used  Substance and Sexual Activity   Alcohol use: No   Drug use: No   Sexual activity: Not Currently  Other Topics Concern   Not on file  Social History Narrative   Married in [place] widowed on 004 sons, 1 daughter, 6 grandchildren, 3 great-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         Per [facility] [place] Packet Abstracted on 08/10/2023:      Diet: Blank      Caffeine: None      Married, if yes what year:  67      Do you live in a house, apartment, assisted living, condo, trailer, ect: House      Is it one or more stories: One      How many persons live in your home? 2      Pets: No pets      Highest level or education completed:  11th grade      Current/Past profession: Psychiatric nurse      Exercise:       yes           Type and how often: standard          Living Will: No   DNR: no   POA/HPOA: no      Functional Status:   Do you have difficulty bathing or dressing yourself? no   Do you have difficulty preparing food or eating?Yes   Do you have difficulty managing your medications?Yes   Do you have difficulty managing your finances? ?   Do you have difficulty affording your medications? No      Social Drivers of Corporate investment banker Strain: Low Risk  (12/10/2022)   Overall Financial Resource Strain (CARDIA)    Difficulty of Paying Living Expenses: Not hard at all  Food Insecurity: No Food Insecurity (06/29/2023)   Hunger Vital Sign    Worried About Running Out of Food in the Last Year: Never true    Ran Out of Food in the Last Year: Never true  Transportation Needs: No Transportation Needs (06/29/2023)   PRAPARE - Administrator, Civil Service (Medical): No    Lack of Transportation  (Non-Medical): No  Physical Activity: Insufficiently Active (12/10/2022)   Exercise Vital Sign    Days of Exercise per Week: 7 days    Minutes of Exercise per Session: 10 min  Stress: No Stress Concern Present (12/10/2022)   [employer] of Occupational  Health - Occupational Stress Questionnaire    Feeling of Stress : Only a little  Social Connections: Moderately Integrated (12/10/2022)   Social Connection and Isolation Panel [NHANES]    Frequency of Communication with Friends and Family: More than three times a week    Frequency of Social Gatherings with Friends and Family: More than three times a week    Attends Religious Services: More than 4 times per year    Active Member of [employer] or Organizations: Yes    Attends Banker Meetings: More than 4 times per year    Marital Status: Widowed    Current Medications:  Current Outpatient Medications:    acetaminophen  (TYLENOL ) 650 MG CR tablet, Take 650 mg by mouth every 8 (eight) hours as needed for pain., Disp: , Rfl:    amLODipine  (NORVASC ) 10 MG tablet, Take 1 tablet (10 mg total) by mouth daily., Disp: 90 tablet, Rfl: 1   atenolol  (TENORMIN ) 100 MG tablet, Take 1 tablet (100 mg total) by mouth daily., Disp: 90 tablet, Rfl: 1   atorvastatin  (LIPITOR) 40 MG tablet, Take 1 tablet (40 mg total) by mouth daily., Disp: 90 tablet, Rfl: 3   b complex vitamins tablet, Take 1 tablet by mouth daily., Disp: , Rfl:    Cholecalciferol (VITAMIN D) 2000 UNITS tablet, Take 2,000 Units by mouth daily., Disp: , Rfl:    clopidogrel  (PLAVIX ) 75 MG tablet, Take 1 tablet (75 mg total) by mouth daily., Disp: 90 tablet, Rfl: 3   cyanocobalamin (VITAMIN B12) 1000 MCG tablet, Take 1,000 mcg by mouth daily., Disp: , Rfl:    dorzolamide -timolol  (COSOPT ) 22.3-6.8 MG/ML ophthalmic solution, Place 1 drop into both eyes 2 (two) times daily., Disp: , Rfl:    fexofenadine (ALLEGRA) 180 MG tablet, Take 180 mg by mouth daily., Disp: , Rfl:     hydroxychloroquine  (PLAQUENIL ) 200 MG tablet, Take 200 mg by mouth 2 (two) times daily., Disp: , Rfl:    latanoprost  (XALATAN ) 0.005 % ophthalmic solution, Place 1 drop into both eyes at bedtime., Disp: , Rfl:    Netarsudil  Dimesylate (RHOPRESSA ) 0.02 % SOLN, Place 1 drop into the right eye at bedtime., Disp: , Rfl:    pantoprazole  (PROTONIX ) 40 MG tablet, Take 1 tablet (40 mg total) by mouth 2 (two) times daily., Disp: 180 tablet, Rfl: 1   polyethylene glycol powder (GLYCOLAX/MIRALAX) 17 GM/SCOOP powder, Take 1 Container by mouth once., Disp: , Rfl:   Review of Symptoms: Complete 10-system review is negative except as above in Interval History.  Physical Exam: Deferred given limitations of phone visit.  Laboratory & Radiologic Studies: MR PELVIS W WO CONTRAST Feb 19, 2024  Narrative CLINICAL DATA:  Uterine mass and vaginal fluid or cystic lesion on recent CT.  EXAM: MRI PELVIS WITHOUT AND WITH CONTRAST  TECHNIQUE: Multiplanar multisequence MR imaging of the pelvis was performed both before and after administration of intravenous contrast.  CONTRAST:  7 mL Vueway   COMPARISON:  CT on 12/07/2023  FINDINGS: Lower Urinary Tract: No urinary bladder or urethral abnormality identified.  Bowel: Unremarkable pelvic bowel loops.  Vascular/Lymphatic: Unremarkable. No pathologically enlarged pelvic lymph nodes identified.  Reproductive:  -- Uterus: Measures 6.1 x 3.4 by 4.8 cm. A single intramural fibroid is seen in the upper left uterine corpus which measures 1.9 cm. Cervix and vagina are unremarkable. Previously seen fluid within the vagina is no longer seen. No evidence of hydrocolpos, or vaginal mass.  -- Right ovary: Not visualized likely due to postmenopausal atrophy, however no  adnexal mass identified.  -- Left ovary: Not visualized likely due to postmenopausal atrophy, however no adnexal mass identified.  Other: No peritoneal thickening or abnormal free  fluid.  Musculoskeletal:  Unremarkable.  IMPRESSION: 1.9 cm intramural fibroid in the upper left uterine corpus.  No evidence of vaginal mass or hydrocolpos. Fluid previously seen in the vagina on prior CT is no longer present.  No adnexal mass or other acute findings.   Electronically Signed By: Marlyce Sine M.D. On: 02/08/2024 15:25

## 2024-03-07 ENCOUNTER — Other Ambulatory Visit

## 2024-03-13 DIAGNOSIS — H401121 Primary open-angle glaucoma, left eye, mild stage: Secondary | ICD-10-CM | POA: Diagnosis not present

## 2024-04-12 ENCOUNTER — Ambulatory Visit (INDEPENDENT_AMBULATORY_CARE_PROVIDER_SITE_OTHER): Admitting: Sports Medicine

## 2024-04-12 ENCOUNTER — Encounter: Payer: Self-pay | Admitting: Sports Medicine

## 2024-04-12 VITALS — BP 108/52 | HR 58 | Temp 97.4°F | Resp 9 | Ht 63.0 in | Wt 149.8 lb

## 2024-04-12 DIAGNOSIS — I10 Essential (primary) hypertension: Secondary | ICD-10-CM

## 2024-04-12 DIAGNOSIS — M05711 Rheumatoid arthritis with rheumatoid factor of right shoulder without organ or systems involvement: Secondary | ICD-10-CM

## 2024-04-12 DIAGNOSIS — E782 Mixed hyperlipidemia: Secondary | ICD-10-CM

## 2024-04-12 DIAGNOSIS — M05712 Rheumatoid arthritis with rheumatoid factor of left shoulder without organ or systems involvement: Secondary | ICD-10-CM | POA: Diagnosis not present

## 2024-04-12 DIAGNOSIS — M542 Cervicalgia: Secondary | ICD-10-CM

## 2024-04-12 DIAGNOSIS — I639 Cerebral infarction, unspecified: Secondary | ICD-10-CM

## 2024-04-12 MED ORDER — AMLODIPINE BESYLATE 10 MG PO TABS: 10.0000 mg | ORAL_TABLET | Freq: Every day | ORAL | 1 refills | Status: DC

## 2024-04-12 MED ORDER — ATENOLOL 50 MG PO TABS: 50.0000 mg | ORAL_TABLET | Freq: Every day | ORAL | 3 refills | Status: DC

## 2024-04-12 NOTE — Progress Notes (Signed)
 Careteam: Patient Care Team: Sherlynn Madden, MD as PCP - General (Internal Medicine) Cindie Ole DASEN, MD as PCP - Electrophysiology (Cardiology) Ishmael Slough, MD as Consulting Physician (Rheumatology) Octavia Charleston, MD as Consulting Physician (Ophthalmology) Avram Lupita BRAVO, MD as Consulting Physician (Gastroenterology) Fate Morna SAILOR, [facility] (Inactive) as Pharmacist (Pharmacist) Gayland Lauraine PARAS, NP as Nurse Practitioner (Neurology) Leva Rush, MD as Consulting Physician (Obstetrics and Gynecology)  PLACE OF SERVICE:  [facility]  Advanced Directive information    Allergies  Allergen Reactions   Ibuprofen  Anaphylaxis   Percocet [Oxycodone -Acetaminophen ]     nausea   Simvastatin  Other (See Comments)    muscle aches   2,4-D Dimethylamine Other (See Comments)   Diltiazem Hcl     REACTION: ANGIO EDEMA   Hydrochlorothiazide     REACTION: SWELLING   Lisinopril     REACTION: SWELLING   Valacyclovir  Other (See Comments)   Verapamil     REACTION: SWELLING    Chief Complaint  Patient presents with   Acute Visit    Patient has pain in neck , patient has been taking arthritis medication has been going on about 3 days.      Discussed the use of AI scribe software for clinical note transcription with the patient, who gave verbal consent to proceed.  History of Present Illness  Briana [name] is a 89 year old female with rheumatoid arthritis who presents with neck pain.  c/o pain in his neck, attributed to sleeping in an awkward position. The pain is localized to the neck and worsens with movement, particularly when bending or turning the neck. She has been taking Tylenol  for arthritis, which provides some relief.  She has a history of rheumatoid arthritis, for which she takes Plaquenil . She also reports arthritis in her ankle and is under the care of a rheumatologist, with an upcoming appointment on August 26.  Her medication regimen includes Plavix ,  atorvastatin  taken at night, amlodipine  10 mg, and atenolol  100 mg. She drinks two to three glasses of water daily.  She uses a walker at home due to balance issues and knee problems, and her children ensure she is supported while walking.  No chest pain, palpitations, breathing difficulties, dizziness, or lightheadedness. She experiences occasional acid reflux, not daily, and has regular bowel movements without blood in the stool or urine. No urinary pain or burning.  She recently saw a gynecologist for a vaginal mass, and an MRI showed normal results. She has not experienced any spotting or bleeding since then.    Review of Systems:  Review of Systems  HENT:  Negative for congestion and sore throat.   Respiratory:  Negative for cough, sputum production and shortness of breath.   Cardiovascular:  Negative for chest pain, palpitations and leg swelling.  Gastrointestinal:  Positive for heartburn. Negative for abdominal pain, blood in stool, constipation, nausea and vomiting.  Genitourinary:  Negative for dysuria and hematuria.  Musculoskeletal:  Positive for neck pain. Negative for falls.  Neurological:  Negative for dizziness.   Negative unless indicated in HPI.   Past Medical History:  Diagnosis Date   ACUTE URIS OF UNSPECIFIED SITE 11/26/2008   ALLERGIC ARTHRITIS OTHER SPECIFIED SITES 10/22/2010   OA + RA   ALLERGIC RHINITIS 10/28/2007   ARTHRITIS, KNEES, BILATERAL 10/28/2007   BACK PAIN 04/12/2009   BUNIONECTOMY, HX OF 10/28/2007   CELLULITIS&ABSCESS OF HAND EXCEPT FINGERS&THUMB 10/23/2010   Complication of anesthesia    itching and makes me crazy   Family  history of anesthesia complication    DAUGHTER HAD HIVES AND  WENT WILD    HYPERLIPIDEMIA 10/28/2007   HYPERTENSION 10/28/2007   INSOMNIA UNSPECIFIED 10/31/2007   MYOSITIS 10/22/2010   NECK PAIN 12/03/2008   Pain in joint, site unspecified 10/07/2010   Stroke (HCC) 06/23/2023   WRIST PAIN, LEFT 10/22/2010   Past  Surgical History:  Procedure Laterality Date   arthroscopic surgery and rotator cuff repair left shoulder     arthroscopy right knee hx of surgery     BUNIONECTOMY     hx of   FOOT SURGERY     RIGHT   hand surgery for broken finger, remote     JOINT REPLACEMENT Right    knee   TOTAL KNEE ARTHROPLASTY  07/01/2012   Procedure: TOTAL KNEE ARTHROPLASTY;  Surgeon: LELON JONETTA Shari Mickey., MD;  Location: [facility] OR;  Service: Orthopedics;  Laterality: Right;  right total knee arthroplasty   TOTAL KNEE ARTHROPLASTY Left 06/13/2014   dr beverley   TOTAL KNEE ARTHROPLASTY Left 06/13/2014   Procedure: TOTAL KNEE ARTHROPLASTY;  Surgeon: Toribio JULIANNA beverley, MD;  Location: [facility] OR;  Service: Orthopedics;  Laterality: Left;   Social History:   reports that she has never smoked. She has never used smokeless tobacco. She reports that she does not drink alcohol and does not use drugs.  Family History  Problem Relation Age of Onset   Heart attack Father    Ovarian cancer Sister    Lung cancer Brother    High blood pressure Daughter    High blood pressure Son    Breast cancer Son    High blood pressure Son    High blood pressure Son    High blood pressure Son    Coronary artery disease Other    Heart attack Other    Colon cancer Neg Hx    Endometrial cancer Neg Hx     Medications: Patient's Medications  New Prescriptions   No medications on file  Previous Medications   ACETAMINOPHEN  (TYLENOL ) 650 MG CR TABLET    Take 650 mg by mouth every 8 (eight) hours as needed for pain.   AMLODIPINE  (NORVASC ) 10 MG TABLET    Take 1 tablet (10 mg total) by mouth daily.   ATENOLOL  (TENORMIN ) 100 MG TABLET    Take 1 tablet (100 mg total) by mouth daily.   ATORVASTATIN  (LIPITOR) 40 MG TABLET    Take 1 tablet (40 mg total) by mouth daily.   B COMPLEX VITAMINS TABLET    Take 1 tablet by mouth daily.   CHOLECALCIFEROL (VITAMIN D) 2000 UNITS TABLET    Take 2,000 Units by mouth daily.   CLOPIDOGREL  (PLAVIX ) 75 MG TABLET    Take 1  tablet (75 mg total) by mouth daily.   CYANOCOBALAMIN (VITAMIN B12) 1000 MCG TABLET    Take 1,000 mcg by mouth daily.   DORZOLAMIDE -TIMOLOL  (COSOPT ) 22.3-6.8 MG/ML OPHTHALMIC SOLUTION    Place 1 drop into both eyes 2 (two) times daily.   FEXOFENADINE (ALLEGRA) 180 MG TABLET    Take 180 mg by mouth as needed for allergies.   HYDROXYCHLOROQUINE  (PLAQUENIL ) 200 MG TABLET    Take 200 mg by mouth 2 (two) times daily.   LATANOPROST  (XALATAN ) 0.005 % OPHTHALMIC SOLUTION    Place 1 drop into both eyes at bedtime.   NETARSUDIL  DIMESYLATE (RHOPRESSA ) 0.02 % SOLN    Place 1 drop into the right eye at bedtime.   PANTOPRAZOLE  (PROTONIX ) 40 MG TABLET  Take 1 tablet (40 mg total) by mouth 2 (two) times daily.   POLYETHYLENE GLYCOL POWDER (GLYCOLAX/MIRALAX) 17 GM/SCOOP POWDER    Take 1 Container by mouth once.  Modified Medications   No medications on file  Discontinued Medications   No medications on file    Physical Exam: Vitals:   04/12/24 0909  BP: (!) 108/52  Pulse: (!) 58  Resp: (!) 9  Temp: (!) 97.4 F (36.3 C)  SpO2: 97%  Weight: 149 lb 12.8 oz (67.9 kg)  Height: 5' 3 (1.6 m)   Body mass index is 26.54 kg/m. BP Readings from Last 3 Encounters:  04/12/24 (!) 108/52  02/09/24 134/78  01/24/24 (!) 142/64   Wt Readings from Last 3 Encounters:  04/12/24 149 lb 12.8 oz (67.9 kg)  02/09/24 149 lb (67.6 kg)  01/24/24 149 lb 3.2 oz (67.7 kg)    Physical Exam Constitutional:      Appearance: Normal appearance.  HENT:     Head: Normocephalic and atraumatic.  Cardiovascular:     Rate and Rhythm: Normal rate and regular rhythm.  Pulmonary:     Effort: Pulmonary effort is normal. No respiratory distress.     Breath sounds: Normal breath sounds. No wheezing.  Abdominal:     General: Bowel sounds are normal. There is no distension.     Tenderness: There is no abdominal tenderness. There is no guarding or rebound.     Comments:    Musculoskeletal:        General: No swelling.      Comments: Neck -rom limited slightly  No spinal or paraspinal tenderness  Neurological:     Mental Status: She is alert. Mental status is at baseline.     Motor: No weakness.     Labs reviewed: Basic Metabolic Panel: Recent Labs    06/23/23 2156 09/15/23 1101 09/19/23 1333  NA 140 143 140  K 3.6 3.8 3.6  CL 104 104 104  CO2 25 29 29   GLUCOSE 118* 134* 115*  BUN 7* 15 14  CREATININE 0.84 1.04* 1.07*  CALCIUM  9.4 9.7 9.3  MG 1.9  --   --   PHOS 3.5  --   --   TSH 1.440  --   --    Liver Function Tests: Recent Labs    06/23/23 2156 09/15/23 1101  AST 26 25  ALT 25 30*  ALKPHOS 114  --   BILITOT 0.9 1.3*  PROT 8.0 7.5  ALBUMIN 3.8  --    Recent Labs    06/23/23 2156 09/15/23 1101  LIPASE 28 22   No results for input(s): AMMONIA in the last 8760 hours. CBC: Recent Labs    06/23/23 1321 09/19/23 1333  WBC 9.0 8.7  NEUTROABS  --  6.5  HGB 12.8 13.4  HCT 37.7 39.4  MCV 89.3 92.1  PLT 183 199   Lipid Panel: Recent Labs    06/24/23 0726  CHOL 176  HDL 100  LDLCALC 66  TRIG 52  CHOLHDL 1.8   TSH: Recent Labs    06/23/23 2156  TSH 1.440   A1C: Lab Results  Component Value Date   HGBA1C 6.3 (H) 06/23/2023    Assessment and Plan Assessment & Plan   1. Neck pain (Primary) take tylenol  650 g every 6 hrs as needed Use heating pads, lidocaine  patches Will refer to physical therapy - Ambulatory referral to Physical Therapy  2. Primary hypertension Decrease atenolol  to 50 mg from 100mg   Monitor  bp daily  - atenolol  (TENORMIN ) 50 MG tablet; Take 1 tablet (50 mg total) by mouth daily.  Dispense: 90 tablet; Refill: 3 - amLODipine  (NORVASC ) 10 MG tablet; Take 1 tablet (10 mg total) by mouth daily.  Dispense: 90 tablet; Refill: 1 - Basic Metabolic Panel   Mixed hyperlipidemia Cont with lipitor   Cerebrovascular accident (CVA), unspecified mechanism (HCC) Followed with neurology Cont with plavix , statin   Rheumatoid arthritis involving  both shoulders with positive rheumatoid factor (HCC) Follow up with rheumatology

## 2024-04-12 NOTE — Patient Instructions (Signed)
 Decrease atenolol  to 50 mg from 100mg   Monitor bp daily   Neck pain - take tylenol  650 g every 6 hrs as needed Use heating pads, lidocaine  patches Will refer to physical therapy

## 2024-04-13 ENCOUNTER — Ambulatory Visit: Payer: Self-pay | Admitting: Sports Medicine

## 2024-04-13 LAB — BASIC METABOLIC PANEL WITH GFR
BUN: 20 mg/dL (ref 7–25)
CO2: 28 mmol/L (ref 20–32)
Calcium: 9.2 mg/dL (ref 8.6–10.4)
Chloride: 99 mmol/L (ref 98–110)
Creat: 0.95 mg/dL (ref 0.60–0.95)
Glucose, Bld: 163 mg/dL — ABNORMAL HIGH (ref 65–99)
Potassium: 3.6 mmol/L (ref 3.5–5.3)
Sodium: 138 mmol/L (ref 135–146)
eGFR: 57 mL/min/{1.73_m2} — ABNORMAL LOW (ref >=60)

## 2024-04-17 NOTE — Therapy (Unsigned)
 OUTPATIENT PHYSICAL THERAPY CERVICAL EVALUATION   Patient Name: Briana [name] MRN: [id number] DOB:05/05/1931, 89 y.o., female Today's Date: 04/19/2024  END OF SESSION:  PT End of Session - 04/19/24 1044     Visit Number 1    Number of Visits 4    Date for PT Re-Evaluation 06/20/24    Authorization Type [employer] MCR    PT Start Time 1045    PT Stop Time 1130    PT Time Calculation (min) 45 min    Activity Tolerance Patient tolerated treatment well    Behavior During Therapy [facility] for tasks assessed/performed          Past Medical History:  Diagnosis Date   ACUTE URIS OF UNSPECIFIED SITE 11/26/2008   ALLERGIC ARTHRITIS OTHER SPECIFIED SITES 10/22/2010   OA + RA   ALLERGIC RHINITIS 10/28/2007   ARTHRITIS, KNEES, BILATERAL 10/28/2007   BACK PAIN 04/12/2009   BUNIONECTOMY, HX OF 10/28/2007   CELLULITIS&ABSCESS OF HAND EXCEPT FINGERS&THUMB 10/23/2010   Complication of anesthesia    itching and makes me crazy   Family history of anesthesia complication    DAUGHTER HAD HIVES AND  WENT WILD    HYPERLIPIDEMIA 10/28/2007   HYPERTENSION 10/28/2007   INSOMNIA UNSPECIFIED 10/31/2007   MYOSITIS 10/22/2010   NECK PAIN 12/03/2008   Pain in joint, site unspecified 10/07/2010   Stroke (HCC) 06/23/2023   WRIST PAIN, LEFT 10/22/2010   Past Surgical History:  Procedure Laterality Date   arthroscopic surgery and rotator cuff repair left shoulder     arthroscopy right knee hx of surgery     BUNIONECTOMY     hx of   FOOT SURGERY     RIGHT   hand surgery for broken finger, remote     JOINT REPLACEMENT Right    knee   TOTAL KNEE ARTHROPLASTY  07/01/2012   Procedure: TOTAL KNEE ARTHROPLASTY;  Surgeon: Briana JONETTA Shari Mickey., MD;  Location: [facility] OR;  Service: Orthopedics;  Laterality: Right;  right total knee arthroplasty   TOTAL KNEE ARTHROPLASTY Left 06/13/2014   dr [name]   TOTAL KNEE ARTHROPLASTY Left 06/13/2014   Procedure: TOTAL KNEE ARTHROPLASTY;  Surgeon: Briana JULIANNA beverley, MD;  Location:  [facility] OR;  Service: Orthopedics;  Laterality: Left;   Patient Active Problem List   Diagnosis Date Noted   Nausea 02/09/2024   Stroke (cerebrum) (HCC) 06/24/2023   Allergic contact dermatitis due to drugs in contact with skin 04/30/2022   Abnormal electrocardiogram (ECG) (EKG) 04/30/2022   Body mass index (BMI) 31.0-31.9, adult 11/21/2021   Other long term (current) drug therapy 11/21/2021   Sciatica 11/21/2021   Stage 3b chronic kidney disease (HCC) 05/30/2020   Type II diabetes mellitus with manifestations (HCC) 12/28/2019   Vertigo, benign paroxysmal, bilateral 08/15/2019   Left lumbar radiculitis 03/07/2017   Essential hypertension, benign 06/19/2014   RA (rheumatoid arthritis) (HCC) 06/19/2014   Constipation due to opioid therapy 06/19/2014   Encounter for general adult medical examination with abnormal findings 05/30/2012   INSOMNIA UNSPECIFIED 10/31/2007   Hyperlipidemia with target LDL less than 130 10/28/2007   Seasonal allergic rhinitis 10/28/2007   DJD (degenerative joint disease) of knee 10/28/2007    PCP: Briana Madden, MD  REFERRING PROVIDER: Sherlynn Madden, MD  REFERRING DIAG: M54.2 (ICD-10-CM) - Neck pain  THERAPY DIAG:  Cervicalgia  Abnormal posture  Rationale for Evaluation and Treatment: Rehabilitation  ONSET DATE: 1 month  SUBJECTIVE:  SUBJECTIVE STATEMENT: Describes a history of neck pain from awkward sleep positions.  5-6/10 in intensity and improving over time. Hand dominance: Right  PERTINENT HISTORY:  Briana [name] is a 89 year old female with rheumatoid arthritis who presents with neck pain.   c/o pain in his neck, attributed to sleeping in an awkward position. The pain is localized to the neck and worsens with movement, particularly  when bending or turning the neck. She has been taking Tylenol  for arthritis, which provides some relief.   She has a history of rheumatoid arthritis, for which she takes Plaquenil . She also reports arthritis in her ankle and is under the care of a rheumatologist, with an upcoming appointment on August 26.   Her medication regimen includes Plavix , atorvastatin  taken at night, amlodipine  10 mg, and atenolol  100 mg. She drinks two to three glasses of water daily.   She uses a walker at home due to balance issues and knee problems, and her children ensure she is supported while walking.  PAIN:  Are you having pain? Yes: NPRS scale: 5-6/10 Pain location: B paracervicals Pain description: ache Aggravating factors: turning and rotation Relieving factors: medication, heating pad  PRECAUTIONS: None  RED FLAGS: None     WEIGHT BEARING RESTRICTIONS: No  FALLS:  Has patient fallen in last 6 months? No  OCCUPATION: retired  PLOF: Independent  PATIENT GOALS: To manage my neck pain  NEXT MD VISIT: TBD  OBJECTIVE:  Note: Objective measures were completed at Evaluation unless otherwise noted.  DIAGNOSTIC FINDINGS:  none  PATIENT SURVEYS:  NDI: 20/50  Minimum Detectable Change (90% confidence): 5 points or 10% points  POSTURE: forward head and depressed L shoulder  PALPATION: TTP as well as tightness in B scalene groups   CERVICAL ROM:   Active ROM A/PROM (deg) eval  Flexion 50%  Extension 10%  Right lateral flexion 25%  Left lateral flexion 25%  Right rotation 50%  Left rotation 50%   (Blank rows = not tested)  UPPER EXTREMITY ROM: WFL  Active ROM Right eval Left eval  Shoulder flexion    Shoulder extension    Shoulder abduction    Shoulder adduction    Shoulder extension    Shoulder internal rotation    Shoulder external rotation    Elbow flexion    Elbow extension    Wrist flexion    Wrist extension    Wrist ulnar deviation    Wrist radial deviation     Wrist pronation    Wrist supination     (Blank rows = not tested)  UPPER EXTREMITY MMT:  MMT Right eval Left eval  Shoulder flexion    Shoulder extension    Shoulder abduction    Shoulder adduction    Shoulder extension    Shoulder internal rotation    Shoulder external rotation    Middle trapezius    Lower trapezius    Elbow flexion    Elbow extension    Wrist flexion    Wrist extension    Wrist ulnar deviation    Wrist radial deviation    Wrist pronation    Wrist supination    Grip strength     (Blank rows = not tested)  CERVICAL SPECIAL TESTS:  Not applicable   FUNCTIONAL TESTS:  N/A  TREATMENT:  [facility] Adult PT Treatment:                                                DATE: 04/19/24 Eval and HEP Self Care: Additional minutes spent for educating on updated Therapeutic Home Exercise Program as well as comparing current status to condition at start of symptoms. This included exercises focusing on stretching, strengthening, with focus on eccentric aspects. Long term goals include an improvement in range of motion, strength, endurance as well as avoiding reinjury. Patient's frequency would include in 1-2 times a day, 3-5 times a week for a duration of 6-12 weeks. Proper technique shown and discussed handout in great detail. All questions were discussed and addressed.      PATIENT EDUCATION:  Education details: Discussed eval findings, rehab rationale and POC and patient is in agreement  Person educated: Patient and Caregiver daughter Education method: Explanation and Handouts Education comprehension: verbalized understanding and needs further education  HOME EXERCISE PROGRAM: Access Code: [email or web address] URL: [email or web address] Date: 04/19/2024 Prepared by: Makynlie Rossini  Program Notes use heat for 10 min prior to stretch.  Have someone  help you with the side bending stretch. (#3)  Exercises - Seated Shoulder Shrugs  - 2-3 x daily - 3 x weekly - 1 sets - 10 reps - Seated Scapular Retraction  - 2-3 x daily - 3 x weekly - 1 sets - 10 reps - Seated Cervical Sidebending Stretch  - 2-3 x daily - 3 x weekly - 1 sets - 10 reps  ASSESSMENT:  CLINICAL IMPRESSION: Patient is a 89 y.o. female who was seen today for physical therapy evaluation and treatment for B neck pain of unknown origin.  Symptoms improving over time and no radicular symptoms reported.  Patient presents with marked forward head posture and associated B scalene tightness but no UT involvement.  Patient is a good candidate for a short course of PT to establish a HEP and monitor symptom progress.  OBJECTIVE IMPAIRMENTS: decreased knowledge of condition, decreased mobility, decreased ROM, increased fascial restrictions, increased muscle spasms, postural dysfunction, and pain.   ACTIVITY LIMITATIONS: bed mobility  PERSONAL FACTORS: Age, Fitness, Past/current experiences, and 1 comorbidity: RA are also affecting patient's functional outcome.   REHAB POTENTIAL: Good  CLINICAL DECISION MAKING: Stable/uncomplicated  EVALUATION COMPLEXITY: Low   GOALS: Goals reviewed with patient? No    SHORT TERM GOALS=LONG TERM GOALS: Target date: 06/20/24  Patient to demonstrate independence in HEP  Baseline: [id number] Goal status: INITIAL  2.  Patient will acknowledge 4/10 pain at least once during episode of care   Baseline: 5-6/10 Goal status: INITIAL  3.  Patient will score at least 15/50 on NDI to signify clinically meaningful improvement in functional abilities.   Baseline: 20/50 Goal status: INITIAL  4.  Increase B cervical SB to 50% Baseline:  Active ROM A/PROM (deg) eval  Flexion 50%  Extension 10%  Right lateral flexion 25%  Left lateral flexion 25%  Right rotation 50%  Left rotation 50%   Goal status: INITIAL     PLAN:  PT FREQUENCY:  1-2x/week  PT DURATION: 4 weeks  PLANNED INTERVENTIONS: 97110-Therapeutic exercises, 97530- Therapeutic activity, [id number]- Neuromuscular re-education, 97535- Self Care, [id number]- Manual therapy, and Patient/Family education  PLAN FOR NEXT SESSION: HEP review and update, manual techniques as appropriate, aerobic tasks, ROM and flexibility activities, strengthening and PREs, TPDN,  gait and balance training as needed   Referring diagnosis? cervicalgia Treatment diagnosis? (if different than referring diagnosis) M54.2 (ICD-10-CM) - Neck pain What was this (referring dx) caused by? []  Surgery []  Fall []  Ongoing issue [x]  Arthritis []  Other: ____________  Laterality: []  Rt []  Lt [x]  Both  Check all possible CPT codes:  *CHOOSE 10 OR LESS*    See Planned Interventions listed in the Plan section of the Evaluation.    Macario Shear M Avrielle Fry, PT 04/19/2024, 1:39 PM

## 2024-04-19 ENCOUNTER — Other Ambulatory Visit: Payer: Self-pay

## 2024-04-19 ENCOUNTER — Ambulatory Visit: Attending: Sports Medicine

## 2024-04-19 DIAGNOSIS — M542 Cervicalgia: Secondary | ICD-10-CM | POA: Insufficient documentation

## 2024-04-19 DIAGNOSIS — R293 Abnormal posture: Secondary | ICD-10-CM | POA: Diagnosis not present

## 2024-04-24 DIAGNOSIS — H401121 Primary open-angle glaucoma, left eye, mild stage: Secondary | ICD-10-CM | POA: Diagnosis not present

## 2024-04-25 ENCOUNTER — Ambulatory Visit

## 2024-05-25 ENCOUNTER — Other Ambulatory Visit: Payer: Self-pay | Admitting: Sports Medicine

## 2024-05-25 DIAGNOSIS — I10 Essential (primary) hypertension: Secondary | ICD-10-CM

## 2024-05-25 DIAGNOSIS — R112 Nausea with vomiting, unspecified: Secondary | ICD-10-CM

## 2024-06-06 ENCOUNTER — Inpatient Hospital Stay (HOSPITAL_BASED_OUTPATIENT_CLINIC_OR_DEPARTMENT_OTHER)
Admission: EM | Admit: 2024-06-06 | Discharge: 2024-06-10 | DRG: 057 | Disposition: A | Discharge status: Skilled Nursing Facility | Attending: Family Medicine | Admitting: Family Medicine

## 2024-06-06 ENCOUNTER — Other Ambulatory Visit: Payer: Self-pay

## 2024-06-06 ENCOUNTER — Emergency Department (HOSPITAL_BASED_OUTPATIENT_CLINIC_OR_DEPARTMENT_OTHER)

## 2024-06-06 ENCOUNTER — Encounter (HOSPITAL_BASED_OUTPATIENT_CLINIC_OR_DEPARTMENT_OTHER): Payer: Self-pay | Admitting: Emergency Medicine

## 2024-06-06 DIAGNOSIS — M069 Rheumatoid arthritis, unspecified: Secondary | ICD-10-CM | POA: Diagnosis present

## 2024-06-06 DIAGNOSIS — E785 Hyperlipidemia, unspecified: Secondary | ICD-10-CM | POA: Diagnosis present

## 2024-06-06 DIAGNOSIS — E538 Deficiency of other specified B group vitamins: Secondary | ICD-10-CM | POA: Diagnosis present

## 2024-06-06 DIAGNOSIS — N179 Acute kidney failure, unspecified: Secondary | ICD-10-CM | POA: Diagnosis present

## 2024-06-06 DIAGNOSIS — I69398 Other sequelae of cerebral infarction: Secondary | ICD-10-CM | POA: Diagnosis present

## 2024-06-06 DIAGNOSIS — E119 Type 2 diabetes mellitus without complications: Secondary | ICD-10-CM | POA: Diagnosis not present

## 2024-06-06 DIAGNOSIS — M65971 Unspecified synovitis and tenosynovitis, right ankle and foot: Secondary | ICD-10-CM | POA: Diagnosis not present

## 2024-06-06 DIAGNOSIS — Z801 Family history of malignant neoplasm of trachea, bronchus and lung: Secondary | ICD-10-CM | POA: Diagnosis not present

## 2024-06-06 DIAGNOSIS — I129 Hypertensive chronic kidney disease with stage 1 through stage 4 chronic kidney disease, or unspecified chronic kidney disease: Secondary | ICD-10-CM | POA: Diagnosis present

## 2024-06-06 DIAGNOSIS — Z96653 Presence of artificial knee joint, bilateral: Secondary | ICD-10-CM | POA: Diagnosis present

## 2024-06-06 DIAGNOSIS — N390 Urinary tract infection, site not specified: Secondary | ICD-10-CM | POA: Diagnosis present

## 2024-06-06 DIAGNOSIS — Z803 Family history of malignant neoplasm of breast: Secondary | ICD-10-CM

## 2024-06-06 DIAGNOSIS — M0609 Rheumatoid arthritis without rheumatoid factor, multiple sites: Secondary | ICD-10-CM | POA: Diagnosis not present

## 2024-06-06 DIAGNOSIS — Z8041 Family history of malignant neoplasm of ovary: Secondary | ICD-10-CM

## 2024-06-06 DIAGNOSIS — N1832 Chronic kidney disease, stage 3b: Secondary | ICD-10-CM | POA: Diagnosis present

## 2024-06-06 DIAGNOSIS — M25571 Pain in right ankle and joints of right foot: Secondary | ICD-10-CM | POA: Diagnosis not present

## 2024-06-06 DIAGNOSIS — M1991 Primary osteoarthritis, unspecified site: Secondary | ICD-10-CM | POA: Diagnosis not present

## 2024-06-06 DIAGNOSIS — R079 Chest pain, unspecified: Secondary | ICD-10-CM | POA: Diagnosis not present

## 2024-06-06 DIAGNOSIS — R531 Weakness: Secondary | ICD-10-CM | POA: Diagnosis not present

## 2024-06-06 DIAGNOSIS — Z6825 Body mass index (BMI) 25.0-25.9, adult: Secondary | ICD-10-CM | POA: Diagnosis not present

## 2024-06-06 DIAGNOSIS — E663 Overweight: Secondary | ICD-10-CM | POA: Diagnosis not present

## 2024-06-06 DIAGNOSIS — R109 Unspecified abdominal pain: Secondary | ICD-10-CM | POA: Diagnosis not present

## 2024-06-06 DIAGNOSIS — G2581 Restless legs syndrome: Secondary | ICD-10-CM | POA: Diagnosis present

## 2024-06-06 DIAGNOSIS — R262 Difficulty in walking, not elsewhere classified: Secondary | ICD-10-CM | POA: Diagnosis not present

## 2024-06-06 DIAGNOSIS — R9082 White matter disease, unspecified: Secondary | ICD-10-CM | POA: Diagnosis not present

## 2024-06-06 DIAGNOSIS — H53462 Homonymous bilateral field defects, left side: Secondary | ICD-10-CM | POA: Diagnosis present

## 2024-06-06 DIAGNOSIS — Z7902 Long term (current) use of antithrombotics/antiplatelets: Secondary | ICD-10-CM | POA: Diagnosis not present

## 2024-06-06 DIAGNOSIS — K219 Gastro-esophageal reflux disease without esophagitis: Secondary | ICD-10-CM | POA: Diagnosis present

## 2024-06-06 DIAGNOSIS — Z8249 Family history of ischemic heart disease and other diseases of the circulatory system: Secondary | ICD-10-CM | POA: Diagnosis not present

## 2024-06-06 DIAGNOSIS — G4089 Other seizures: Secondary | ICD-10-CM | POA: Diagnosis not present

## 2024-06-06 DIAGNOSIS — Z883 Allergy status to other anti-infective agents status: Secondary | ICD-10-CM

## 2024-06-06 DIAGNOSIS — R569 Unspecified convulsions: Principal | ICD-10-CM

## 2024-06-06 DIAGNOSIS — I6782 Cerebral ischemia: Secondary | ICD-10-CM | POA: Diagnosis not present

## 2024-06-06 DIAGNOSIS — Z1152 Encounter for screening for COVID-19: Secondary | ICD-10-CM | POA: Diagnosis not present

## 2024-06-06 DIAGNOSIS — G9389 Other specified disorders of brain: Secondary | ICD-10-CM | POA: Diagnosis not present

## 2024-06-06 DIAGNOSIS — E876 Hypokalemia: Secondary | ICD-10-CM | POA: Diagnosis present

## 2024-06-06 DIAGNOSIS — Z8619 Personal history of other infectious and parasitic diseases: Secondary | ICD-10-CM

## 2024-06-06 DIAGNOSIS — I700 Atherosclerosis of aorta: Secondary | ICD-10-CM | POA: Diagnosis not present

## 2024-06-06 DIAGNOSIS — G40409 Other generalized epilepsy and epileptic syndromes, not intractable, without status epilepticus: Secondary | ICD-10-CM | POA: Diagnosis not present

## 2024-06-06 DIAGNOSIS — E1122 Type 2 diabetes mellitus with diabetic chronic kidney disease: Secondary | ICD-10-CM | POA: Diagnosis present

## 2024-06-06 DIAGNOSIS — G40901 Epilepsy, unspecified, not intractable, with status epilepticus: Principal | ICD-10-CM | POA: Diagnosis present

## 2024-06-06 DIAGNOSIS — Z885 Allergy status to narcotic agent status: Secondary | ICD-10-CM

## 2024-06-06 DIAGNOSIS — G40401 Other generalized epilepsy and epileptic syndromes, not intractable, with status epilepticus: Secondary | ICD-10-CM | POA: Diagnosis present

## 2024-06-06 DIAGNOSIS — Z886 Allergy status to analgesic agent status: Secondary | ICD-10-CM | POA: Diagnosis not present

## 2024-06-06 DIAGNOSIS — R42 Dizziness and giddiness: Secondary | ICD-10-CM | POA: Diagnosis not present

## 2024-06-06 DIAGNOSIS — M6259 Muscle wasting and atrophy, not elsewhere classified, multiple sites: Secondary | ICD-10-CM | POA: Diagnosis not present

## 2024-06-06 DIAGNOSIS — Z79899 Other long term (current) drug therapy: Secondary | ICD-10-CM | POA: Diagnosis not present

## 2024-06-06 DIAGNOSIS — H409 Unspecified glaucoma: Secondary | ICD-10-CM | POA: Diagnosis present

## 2024-06-06 DIAGNOSIS — Z8673 Personal history of transient ischemic attack (TIA), and cerebral infarction without residual deficits: Secondary | ICD-10-CM | POA: Diagnosis not present

## 2024-06-06 DIAGNOSIS — I10 Essential (primary) hypertension: Secondary | ICD-10-CM | POA: Diagnosis not present

## 2024-06-06 DIAGNOSIS — R600 Localized edema: Secondary | ICD-10-CM | POA: Diagnosis not present

## 2024-06-06 DIAGNOSIS — R918 Other nonspecific abnormal finding of lung field: Secondary | ICD-10-CM | POA: Diagnosis not present

## 2024-06-06 DIAGNOSIS — R0989 Other specified symptoms and signs involving the circulatory and respiratory systems: Secondary | ICD-10-CM | POA: Diagnosis not present

## 2024-06-06 DIAGNOSIS — M25471 Effusion, right ankle: Secondary | ICD-10-CM | POA: Diagnosis not present

## 2024-06-06 DIAGNOSIS — I63233 Cerebral infarction due to unspecified occlusion or stenosis of bilateral carotid arteries: Secondary | ICD-10-CM | POA: Diagnosis not present

## 2024-06-06 DIAGNOSIS — Z741 Need for assistance with personal care: Secondary | ICD-10-CM | POA: Diagnosis not present

## 2024-06-06 DIAGNOSIS — R29818 Other symptoms and signs involving the nervous system: Secondary | ICD-10-CM | POA: Diagnosis not present

## 2024-06-06 DIAGNOSIS — Z888 Allergy status to other drugs, medicaments and biological substances status: Secondary | ICD-10-CM

## 2024-06-06 DIAGNOSIS — Z7401 Bed confinement status: Secondary | ICD-10-CM | POA: Diagnosis not present

## 2024-06-06 LAB — CBC WITH DIFFERENTIAL/PLATELET
Abs Immature Granulocytes: 0.03 K/uL (ref 0.00–0.07)
Basophils Absolute: 0.1 K/uL (ref 0.0–0.1)
Basophils Relative: 1 %
Eosinophils Absolute: 0.2 K/uL (ref 0.0–0.5)
Eosinophils Relative: 1 %
HCT: 39.1 % (ref 36.0–46.0)
Hemoglobin: 13.4 g/dL (ref 12.0–15.0)
Immature Granulocytes: 0 %
Lymphocytes Relative: 18 %
Lymphs Abs: 1.9 K/uL (ref 0.7–4.0)
MCH: 30.7 pg (ref 26.0–34.0)
MCHC: 34.3 g/dL (ref 30.0–36.0)
MCV: 89.7 fL (ref 80.0–100.0)
Monocytes Absolute: 0.8 K/uL (ref 0.1–1.0)
Monocytes Relative: 7 %
Neutro Abs: 7.7 K/uL (ref 1.7–7.7)
Neutrophils Relative %: 73 %
Platelets: 205 K/uL (ref 150–400)
RBC: 4.36 MIL/uL (ref 3.87–5.11)
RDW: 16.4 % — ABNORMAL HIGH (ref 11.5–15.5)
WBC: 10.7 K/uL — ABNORMAL HIGH (ref 4.0–10.5)
nRBC: 0 % (ref 0.0–0.2)

## 2024-06-06 LAB — COMPREHENSIVE METABOLIC PANEL WITH GFR
ALT: 64 U/L — ABNORMAL HIGH (ref 0–44)
AST: 47 U/L — ABNORMAL HIGH (ref 15–41)
Albumin: 4.6 g/dL (ref 3.5–5.0)
Alkaline Phosphatase: 149 U/L — ABNORMAL HIGH (ref 38–126)
Anion gap: 18 — ABNORMAL HIGH (ref 5–15)
BUN: 19 mg/dL (ref 8–23)
CO2: 18 mmol/L — ABNORMAL LOW (ref 22–32)
Calcium: 10.2 mg/dL (ref 8.9–10.3)
Chloride: 103 mmol/L (ref 98–111)
Creatinine, Ser: 1.13 mg/dL — ABNORMAL HIGH (ref 0.44–1.00)
GFR, Estimated: 46 mL/min — ABNORMAL LOW (ref >60)
Glucose, Bld: 148 mg/dL — ABNORMAL HIGH (ref 70–99)
Potassium: 4 mmol/L (ref 3.5–5.1)
Sodium: 139 mmol/L (ref 135–145)
Total Bilirubin: 0.9 mg/dL (ref 0.0–1.2)
Total Protein: 8.2 g/dL — ABNORMAL HIGH (ref 6.5–8.1)

## 2024-06-06 LAB — RESP PANEL BY RT-PCR (RSV, FLU A&B, COVID)  RVPGX2
Influenza A by PCR: NEGATIVE
Influenza B by PCR: NEGATIVE
Resp Syncytial Virus by PCR: NEGATIVE
SARS Coronavirus 2 by RT PCR: NEGATIVE

## 2024-06-06 LAB — CBG MONITORING, ED: Glucose-Capillary: 152 mg/dL — ABNORMAL HIGH (ref 70–99)

## 2024-06-06 LAB — TROPONIN T, HIGH SENSITIVITY: Troponin T High Sensitivity: 34 ng/L — ABNORMAL HIGH (ref 0–19)

## 2024-06-06 LAB — MAGNESIUM: Magnesium: 2 mg/dL (ref 1.7–2.4)

## 2024-06-06 MED ORDER — IOHEXOL 350 MG/ML SOLN
100.0000 mL | Freq: Once | INTRAVENOUS | Status: AC | PRN
  Administered 2024-06-06: 85 mL via INTRAVENOUS

## 2024-06-06 MED ORDER — LACTATED RINGERS IV SOLN: INTRAVENOUS | Status: DC

## 2024-06-06 MED ORDER — LEVETIRACETAM (KEPPRA) 500 MG/5 ML ADULT IV PUSH
1000.0000 mg | Freq: Once | INTRAVENOUS | Status: AC
  Administered 2024-06-06: 1000 mg via INTRAVENOUS
  Filled 2024-06-06: qty 10

## 2024-06-06 MED ORDER — LORAZEPAM 2 MG/ML IJ SOLN
INTRAMUSCULAR | Status: AC
  Administered 2024-06-06: 2 mg
  Filled 2024-06-06: qty 1

## 2024-06-06 NOTE — ED Triage Notes (Signed)
 Per pt daughter, pt called stated was sick, weak, with body pain and shaking. Pt states dizzy and feels like she going to pass out.

## 2024-06-06 NOTE — ED Provider Notes (Signed)
 I provided a substantive portion of the care of this patient.  I personally made/approved the management plan for this patient and take responsibility for the patient management.  EKG Interpretation Date/Time:  Tuesday June 06 2024 20:51:02 EDT Ventricular Rate:  65 PR Interval:  146 QRS Duration:  80 QT Interval:  430 QTC Calculation: 447 R Axis:   -26  Text Interpretation: Normal sinus rhythm normal, no sig change from previous Confirmed by Armenta Canning [phone]) on 06/06/2024 10:53:58 PM   Patient has history of CVA.  She was seen by her rheumatologist today.  She has been having problems with pain in her back.  Family members report that she started reporting progressive and fairly severe pain in her left lower abdomen and her back.  She reports then that she was getting a leg tremor on the left with pain that was spreading into her back as well.  She could not control this tremoring and found it very painful.  Symptoms seem to be getting worse quickly per her daughter.  The symptoms progressed from uncontrollable tremoring of the left leg to full body seizure.  I encountered the patient having tonic-clonic seizure activity.  This was full body and the patient was not responsive.  Airway was supported and oxygen applied.  The patient was given 1 mg Ativan .  Generalized seizure activity abated.  Patient's respirations were spontaneous.  Shortly after the first round of seizure activity as patient was being assessed and supported, I observed jerking muscular spasms of the left leg.  At this time patient still remained obtunded.  An additional milligram Ativan  administered.  Symptoms abated.  I have reviewed the patient's EMR and old CT scans as well as her new CT scan personally reviewed by myself.  Patient has large area of encephalomalacia from old stroke.  At this time, with area of encephalomalacia in focal as well as generalized seizure, I suspect this is a likely trigger.  I have ordered  Keppra  load as well.  Patient's blood pressures are normotensive and heart rate is regular on the monitor normal narrow complex in the 60s and 70s.  She is maintaining 100% oxygenation with a nonrebreather mask.  Respirations are slow but nonlabored.  Patient has remained postictal.  I have had discussion with family members regarding CODE STATUS.  They wish for the patient to remain full code until he discussed with other family members.  I did review potential poor outcomes with intubation or resuscitation with comorbid condition of prior stroke and advanced age.  Family members are in agreement at this time they do wish to proceed with full code and medical therapy.  The patient is postictal but is not showing signs of airway compromise.  She is not having any vomiting.  At this time I do not feel that she needs intubation.  I have personally reviewed dissection study I do not see pneumothorax or dissection or central PE.  Consult: Case reviewed with Dr. Orlin Fairly.  Will admit for continued management of suspected status epilepticus.   CRITICAL CARE Performed by: Canning Armenta   Total critical care time: 60 minutes  Critical care time was exclusive of separately billable procedures and treating other patients.  Critical care was necessary to treat or prevent imminent or life-threatening deterioration.  Critical care was time spent personally by me on the following activities: development of treatment plan with patient and/or surrogate as well as nursing, discussions with consultants, evaluation of patient's response to treatment, examination of  patient, obtaining history from patient or surrogate, ordering and performing treatments and interventions, ordering and review of laboratory studies, ordering and review of radiographic studies, pulse oximetry and re-evaluation of patient's condition.    Armenta Canning, MD 06/06/24 [phone]

## 2024-06-06 NOTE — ED Notes (Signed)
 RT at bedside r/t seizure activity. Patient placed on NRB. Suction at bedside. Oral airway suctioned with white, clear, bloody secretions obtained. SpO2 100% at this time. Patient non-responsive to sternal rub and voice.

## 2024-06-06 NOTE — ED Provider Notes (Signed)
 [facility]ERGENC[facility] DEPARTMENT AT [facility] Provider Note  CSN: [id number] Arrival date & time: 06/06/24 2036  Chief Complaint(s) Weakness  HPI Briana [name] is a 89 y.o. he patient is a 89 year old female with a past medical history of CVA, hypertension, and hyperlipidemia who presents with left-sided pain and weakness. Earlier today, she had a routine visit with her rheumatologist, which was unremarkable. Labs were discussed due to a right-sided liver lesion, and she left using her cane while continuing her usual medications.  Shortly after, she developed progressive left-sided abdominal pain and generalized weakness, prompting her to go to the hospital. Upon arrival, she experienced leg tremor lasting 30 seconds to 1 minute, which subsequently spread to her back. She had no headache, fever, trauma, chest pain, or prior weakness. Her blood pressure was slightly elevated (130-160 mmHg). She required a wheelchair to get to her car, and her daughter expressed concern about her worsening condition.  Past Medical History Past Medical History:  Diagnosis Date   ACUTE URIS OF UNSPECIFIED SITE 11/26/2008   ALLERGIC ARTHRITIS OTHER SPECIFIED SITES 10/22/2010   OA + RA   ALLERGIC RHINITIS 10/28/2007   ARTHRITIS, KNEES, BILATERAL 10/28/2007   BACK PAIN 04/12/2009   BUNIONECTOMY, HX OF 10/28/2007   CELLULITIS&ABSCESS OF HAND EXCEPT FINGERS&THUMB 10/23/2010   Complication of anesthesia    itching and makes me crazy   Family history of anesthesia complication    DAUGHTER HAD HIVES AND  WENT WILD    HYPERLIPIDEMIA 10/28/2007   HYPERTENSION 10/28/2007   INSOMNIA UNSPECIFIED 10/31/2007   MYOSITIS 10/22/2010   NECK PAIN 12/03/2008   Pain in joint, site unspecified 10/07/2010   Stroke (HCC) 06/23/2023   WRIST PAIN, LEFT 10/22/2010   Patient Active Problem List   Diagnosis Date Noted   Nausea 02/09/2024   Stroke (cerebrum) (HCC) 06/24/2023   Allergic contact dermatitis due to  drugs in contact with skin 04/30/2022   Abnormal electrocardiogram (ECG) (EKG) 04/30/2022   Body mass index (BMI) 31.0-31.9, adult 11/21/2021   Other long term (current) drug therapy 11/21/2021   Sciatica 11/21/2021   Stage 3b chronic kidney disease (HCC) 05/30/2020   Type II diabetes mellitus with manifestations (HCC) 12/28/2019   Vertigo, benign paroxysmal, bilateral 08/15/2019   Left lumbar radiculitis 03/07/2017   Essential hypertension, benign 06/19/2014   RA (rheumatoid arthritis) (HCC) 06/19/2014   Constipation due to opioid therapy 06/19/2014   Encounter for general adult medical examination with abnormal findings 05/30/2012   INSOMNIA UNSPECIFIED 10/31/2007   Hyperlipidemia with target LDL less than 130 10/28/2007   Seasonal allergic rhinitis 10/28/2007   DJD (degenerative joint disease) of knee 10/28/2007   Home Medication(s) Prior to Admission medications   Medication Sig Start Date End Date Taking? Authorizing Provider  acetaminophen  (TYLENOL ) 650 MG CR tablet Take 650 mg by mouth every 8 (eight) hours as needed for pain.    [provider]  amLODipine  (NORVASC ) 10 MG tablet TAKE 1 TABLET EVERY DAY 05/25/24   Sherlynn Madden, MD  atenolol  (TENORMIN ) 50 MG tablet Take 1 tablet (50 mg total) by mouth daily. 04/12/24   Sherlynn Madden, MD  atorvastatin  (LIPITOR) 40 MG tablet Take 1 tablet (40 mg total) by mouth daily. 08/11/23 04/12/24  Sherlynn Madden, MD  b complex vitamins tablet Take 1 tablet by mouth daily.    [provider]  Cholecalciferol (VITAMIN D) 2000 UNITS tablet Take 2,000 Units by mouth daily.    [provider]  clopidogrel  (PLAVIX ) 75 MG tablet Take  1 tablet (75 mg total) by mouth daily. 08/11/23   Sherlynn Madden, MD  cyanocobalamin  (VITAMIN B12) 1000 MCG tablet Take 1,000 mcg by mouth daily.    [provider]  dorzolamide -timolol  (COSOPT ) 22.3-6.8 MG/ML ophthalmic solution Place 1 drop into both eyes 2  (two) times daily.    [provider]  fexofenadine (ALLEGRA) 180 MG tablet Take 180 mg by mouth as needed for allergies.    [provider]  hydroxychloroquine  (PLAQUENIL ) 200 MG tablet Take 200 mg by mouth 2 (two) times daily.    Ishmael Slough, MD  latanoprost  (XALATAN ) 0.005 % ophthalmic solution Place 1 drop into both eyes at bedtime.    [provider]  Netarsudil  Dimesylate (RHOPRESSA ) 0.02 % SOLN Place 1 drop into the right eye at bedtime.    [provider]  pantoprazole  (PROTONIX ) 40 MG tablet TAKE 1 TABLET TWICE DAILY 05/25/24   Veludandi, Prashanthi, MD  polyethylene glycol powder (GLYCOLAX /MIRALAX ) 17 GM/SCOOP powder Take 1 Container by mouth once.    [provider]                                                                                                                                    Past Surgical History Past Surgical History:  Procedure Laterality Date   arthroscopic surgery and rotator cuff repair left shoulder     arthroscopy right knee hx of surgery     BUNIONECTOMY     hx of   FOOT SURGERY     RIGHT   hand surgery for broken finger, remote     JOINT REPLACEMENT Right    knee   TOTAL KNEE ARTHROPLASTY  07/01/2012   Procedure: TOTAL KNEE ARTHROPLASTY;  Surgeon: LELON JONETTA Shari Mickey., MD;  Location: [facility] OR;  Service: Orthopedics;  Laterality: Right;  right total knee arthroplasty   TOTAL KNEE ARTHROPLASTY Left 06/13/2014   dr beverley   TOTAL KNEE ARTHROPLASTY Left 06/13/2014   Procedure: TOTAL KNEE ARTHROPLASTY;  Surgeon: Toribio JULIANNA beverley, MD;  Location: [facility] OR;  Service: Orthopedics;  Laterality: Left;   Family History Family History  Problem Relation Age of Onset   Heart attack Father    Ovarian cancer Sister    Lung cancer Brother    High blood pressure Daughter    High blood pressure Son    Breast cancer Son    High blood pressure Son    High blood pressure Son    High blood pressure Son    Coronary artery disease  Other    Heart attack Other    Colon cancer Neg Hx    Endometrial cancer Neg Hx     Social History Social History   Tobacco Use   Smoking status: Never   Smokeless tobacco: Never  Vaping Use   Vaping status: Never Used  Substance Use Topics   Alcohol use: No   Drug use: No   Allergies  Ibuprofen ; Percocet [oxycodone -acetaminophen ]; Simvastatin ; 2,4-d dimethylamine; Diltiazem hcl; Hydrochlorothiazide; Lisinopril; Valacyclovir ; and Verapamil  Review of Systems A thorough review of systems was obtained and all systems are negative except as noted in the HPI and PMH.   Physical Exam Vital Signs  I have reviewed the triage vital signs BP (!) 127/57   Pulse 62   Temp 98 F (36.7 C) (Oral)   Resp 18   Ht 5' 4 (1.626 m)   Wt 67.6 kg   SpO2 100%   BMI 25.58 kg/m  On initial evaluation, patient was actively seizing, with tonic-clonic movements of lower extremities noted. Postictal state (after seizure): Patient was non-responsive to verbal or painful stimuli. Appeared somnolent and obtunded, consistent with postictal phase. HEENT: Right side pupil reactive to light, left-sided pupil fixed non reactive.  Cardiac:Regular rate and rhythm, no murmurs, rubs, or gallops. Respiratory: Adequate chest rise and fall Neurologic: During seizure: generalized tonic-clonic activity. Postictal: patient unresponsive, not following commands, no purposeful movements. No focal asymmetry appreciated in limited exam during unresponsiveness.   ED Results and Treatments Labs (all labs ordered are listed, but only abnormal results are displayed) Labs Reviewed  CBC WITH DIFFERENTIAL/PLATELET - Abnormal; Notable for the following components:      Result Value   WBC 10.7 (*)    RDW 16.4 (*)    All other components within normal limits  COMPREHENSIVE METABOLIC PANEL WITH GFR - Abnormal; Notable for the following components:   CO2 18 (*)    Glucose, Bld 148 (*)    Creatinine, Ser 1.13 (*)     Total Protein 8.2 (*)    AST 47 (*)    ALT 64 (*)    Alkaline Phosphatase 149 (*)    GFR, Estimated 46 (*)    Anion gap 18 (*)    All other components within normal limits  CBG MONITORING, ED - Abnormal; Notable for the following components:   Glucose-Capillary 152 (*)    All other components within normal limits  TROPONIN T, HIGH SENSITIVITY - Abnormal; Notable for the following components:   Troponin T High Sensitivity 34 (*)    All other components within normal limits  RESP PANEL BY RT-PCR (RSV, FLU A&B, COVID)  RVPGX2  MAGNESIUM  URINALYSIS, ROUTINE W REFLEX MICROSCOPIC  TROPONIN T, HIGH SENSITIVITY                                                                                                                          Radiology CT Angio Chest/Abd/Pel for Dissection W and/or W/WO Result Date: 06/06/2024 CLINICAL DATA:  Dizzy. Body pain. Shaking acute aortic syndrome (AAS) suspected Incidental pneumothorax on chest x-ray EXAM: CT ANGIOGRAPHY CHEST, ABDOMEN AND PELVIS TECHNIQUE: Non-contrast CT of the chest was initially obtained. Multidetector CT imaging through the chest, abdomen and pelvis was performed using the standard protocol during bolus administration of intravenous contrast. Multiplanar reconstructed images and MIPs were obtained and reviewed to evaluate the vascular anatomy. RADIATION DOSE REDUCTION:  This exam was performed according to the departmental dose-optimization program which includes automated exposure control, adjustment of the mA and/or kV according to patient size and/or use of iterative reconstruction technique. CONTRAST:  85mL OMNIPAQUE  IOHEXOL  350 MG/ML SOLN COMPARISON:  MR pelvis 02/04/2024, CT abdomen pelvis 12/07/2023, CT chest 09/19/2023 FINDINGS: CTA CHEST FINDINGS Cardiovascular: Preferential opacification of the thoracic aorta. No evidence of thoracic aortic aneurysm or dissection. Normal heart size. No significant pericardial effusion. Moderate  atherosclerotic plaque of the thoracic aorta. Four-vessel coronary artery calcifications. The main pulmonary artery is normal in caliber. No central or segmental pulmonary embolus. Mediastinum/Nodes: No enlarged mediastinal, hilar, or axillary lymph nodes. Thyroid  gland, trachea, and esophagus demonstrate no significant findings. Lungs/Pleura: Bilateral lower lobe atelectasis. No focal consolidation. No pulmonary nodule. No pulmonary mass. No pleural effusion. No pneumothorax. Musculoskeletal: No chest wall abnormality. No suspicious lytic or blastic osseous lesions. No acute displaced fracture. Review of the MIP images confirms the above findings. CTA ABDOMEN AND PELVIS FINDINGS VASCULAR Aorta: Moderate atherosclerotic plaque. Normal caliber aorta without aneurysm, dissection, vasculitis or significant stenosis. Celiac: Patent without evidence of aneurysm, dissection, vasculitis or significant stenosis. SMA: Patent without evidence of aneurysm, dissection, vasculitis or significant stenosis. Renals: Mild atherosclerotic plaque. Both renal arteries are patent without evidence of aneurysm, dissection, vasculitis, fibromuscular dysplasia or significant stenosis. IMA: Patent without evidence of aneurysm, dissection, vasculitis or significant stenosis. Inflow: Mild atherosclerotic plaque. Patent without evidence of aneurysm, dissection, vasculitis or significant stenosis. Veins: No obvious venous abnormality within the limitations of this arterial phase study. Review of the MIP images confirms the above findings. NON-VASCULAR Hepatobiliary: No focal liver abnormality. No gallstones, gallbladder wall thickening, or pericholecystic fluid. No biliary dilatation. Pancreas: No focal lesion. Normal pancreatic contour. No surrounding inflammatory changes. No main pancreatic ductal dilatation. Spleen: Normal in size without focal abnormality. Adrenals/Urinary Tract: No adrenal nodule bilaterally. Bilateral kidneys enhance  symmetrically. Fluid density lesion of the kidneys likely represent simple renal cysts. Left inferior renal pole scarring. No hydronephrosis. No hydroureter.  No nephroureterolithiasis. The urinary bladder is unremarkable. Stomach/Bowel: Stomach is within normal limits. No evidence of bowel wall thickening or dilatation. Appendix appears normal. Lymphatic: No lymphadenopathy. Reproductive: Uterus and bilateral adnexa are unremarkable. Other: No intraperitoneal free fluid. No intraperitoneal free gas. No organized fluid collection. Musculoskeletal: No abdominal wall hernia or abnormality. No suspicious lytic or blastic osseous lesions. No acute displaced fracture. Review of the MIP images confirms the above findings. IMPRESSION: 1. No acute thoracic and abdominal aorta abnormality. 2. No central or segmental pulmonary embolus. Limited evaluation more distally. 3. No acute intrathoracic, intra-abdominal, intrapelvic abnormality. Electronically Signed   By: Morgane  Naveau M.D.   On: 06/06/2024 23:25   CT Head Wo Contrast Result Date: 06/06/2024 CLINICAL DATA:  Dizziness and presyncope. EXAM: CT HEAD WITHOUT CONTRAST TECHNIQUE: Contiguous axial images were obtained from the base of the skull through the vertex without intravenous contrast. RADIATION DOSE REDUCTION: This exam was performed according to the departmental dose-optimization program which includes automated exposure control, adjustment of the mA and/or kV according to patient size and/or use of iterative reconstruction technique. COMPARISON:  June 23, 2023 FINDINGS: Brain: There is generalized cerebral atrophy with widening of the extra-axial spaces and ventricular dilatation. There are areas of decreased attenuation within the white matter tracts of the supratentorial brain, consistent with microvascular disease changes. A chronic right posterior parietal lobe infarct is seen. Small, chronic bilateral cerebellar infarcts are noted. Vascular:  Moderate to marked severity bilateral cavernous carotid artery  calcification is noted. Skull: Normal. Negative for fracture or focal lesion. Sinuses/Orbits: No acute finding. Other: None. IMPRESSION: 1. Generalized cerebral atrophy with chronic white matter small vessel ischemic changes. 2. Chronic right posterior parietal lobe and bilateral cerebellar infarcts. 3. No acute intracranial abnormality. Electronically Signed   By: Suzen Dials M.D.   On: 06/06/2024 22:30   DG Chest Portable 1 View Result Date: 06/06/2024 CLINICAL DATA:  Weakness with body pain and dizziness. EXAM: PORTABLE CHEST 1 VIEW COMPARISON:  September 15, 2023 FINDINGS: The cardiac silhouette is mildly enlarged and unchanged in size. There is marked severity calcification of the aortic arch. Low lung volumes are noted. No acute infiltrate or pleural effusion is identified. A very thin linear opacity is seen along the periphery of the left lung base. The visualized skeletal structures are unremarkable. IMPRESSION: Findings which may represent a small pneumothorax along the lateral aspect of the left lung base. Correlation with chest CT is recommended. Electronically Signed   By: Suzen Dials M.D.   On: 06/06/2024 22:26    Pertinent labs & imaging results that were available during my care of the patient were reviewed by me and considered in my medical decision making (see MDM for details).  Medications Ordered in ED Medications  LORazepam  (ATIVAN ) 2 MG/ML injection (2 mg  Given 06/06/24 2216)  levETIRAcetam  (KEPPRA ) undiluted injection 1,000 mg (1,000 mg Intravenous Given 06/06/24 2231)  iohexol  (OMNIPAQUE ) 350 MG/ML injection 100 mL (85 mLs Intravenous Contrast Given 06/06/24 2304)                                                                                                                                     Procedures Procedures  (including critical care time)  Medical Decision Making / ED Course   Medical Decision  Making:   ILHAM ROUGHTON is a 89 y.o. female with a history of CVA, hypertension, and hyperlipidemia who presented with left-sided pain and generalized weakness. Earlier in the day, she had a routine visit with her rheumatologist, which was unremarkable except for discussion of a right-sided liver lesion. After the visit, she began experiencing progressive left-sided abdominal pain and overall weakness, prompting her to seek care in the ED.  Upon arrival, she developed leg convulsions lasting 30-60 seconds, which spread to her back. She had no associated headache, fever, trauma, chest pain, or prior episodes of weakness. Her blood pressure was slightly elevated earlier in the day. Given her age and history of cerebrovascular disease, there was concern for a neurologic event, metabolic disturbance, infection, or cardiovascular complication.  In the ED, initial labs including CBC, CMP, UA, troponin, magnesium, and a respiratory panel were obtained, revealing a mild leukocytosis but otherwise normal electrolytes and no infection. Imaging included a non-contrast CT head, which showed chronic infarcts and cerebral atrophy, chest X-ray, and a CT angiogram of chest, abdomen, and pelvis to rule out dissection, all without acute findings.  During her ED stay, she experienced one seizure episode, which was promptly treated with Ativan  and Keppra , resulting in cessation of convulsions. Supportive care was continued, and she remained hemodynamically stable throughout evaluation. Given the combination of acute seizure, left-sided pain, and generalized weakness, the differential remained broad, including metabolic derangements, seizure recurrence, cerebrovascular events, or visceral pathology related to the liver lesion.  The plan was to continue neurologic monitoring, evaluate for underlying causes of the seizure and pain, manage symptoms with appropriate medications, and consider consultation with neurology and  rheumatology. The patient's family was kept informed throughout, and she remained under close observation for any recurrence of symptoms.  Lab:   CBC: WBC 10.7 10/L CMP: Normal electrolytes UA: Normal, no bacteriuria Troponin: Pending/Normal EKG: No acute changes Respiratory panel and magnesium: Pending/Normal  Imaging:  CT Head (without contrast): Generalized cerebral atrophy, chronic right posterior parietal lobe and bilateral solid cerebral infarcts Chest X-ray: No pneumothorax, left lung base clear CT Angio Chest/Abdomen/Pelvis (with and without contrast): No acute thoracic and abdominal aorta abnormality.   Clinical Course as of 06/06/24 2334  Tue Jun 06, 2024  2158 Comprehensive metabolic panel [MA]    Clinical Course User Index [MA] Bernadine Manos, MD     Additional history obtained: -Additional history obtained from family -External records from outside source obtained and reviewed including: Chart review including previous notes, labs, imaging, consultation notes including   Lab Tests: -I ordered, reviewed, and interpreted labs.   The pertinent results include:   Labs Reviewed  CBC WITH DIFFERENTIAL/PLATELET - Abnormal; Notable for the following components:      Result Value   WBC 10.7 (*)    RDW 16.4 (*)    All other components within normal limits  COMPREHENSIVE METABOLIC PANEL WITH GFR - Abnormal; Notable for the following components:   CO2 18 (*)    Glucose, Bld 148 (*)    Creatinine, Ser 1.13 (*)    Total Protein 8.2 (*)    AST 47 (*)    ALT 64 (*)    Alkaline Phosphatase 149 (*)    GFR, Estimated 46 (*)    Anion gap 18 (*)    All other components within normal limits  CBG MONITORING, ED - Abnormal; Notable for the following components:   Glucose-Capillary 152 (*)    All other components within normal limits  TROPONIN T, HIGH SENSITIVITY - Abnormal; Notable for the following components:   Troponin T High Sensitivity 34 (*)    All other  components within normal limits  RESP PANEL BY RT-PCR (RSV, FLU A&B, COVID)  RVPGX2  MAGNESIUM  URINALYSIS, ROUTINE W REFLEX MICROSCOPIC  TROPONIN T, HIGH SENSITIVITY     EKG   EKG Interpretation Date/Time:  Tuesday June 06 2024 20:51:02 EDT Ventricular Rate:  65 PR Interval:  146 QRS Duration:  80 QT Interval:  430 QTC Calculation: 447 R Axis:   -26  Text Interpretation: Normal sinus rhythm normal, no sig change from previous Confirmed by Armenta Canning [id number]) on 06/06/2024 10:53:58 PM         Imaging Studies ordered: I ordered imaging studies including  I independently visualized the following imaging with scope of interpretation limited to determining acute life threatening conditions related to emergency care; findings noted above I agree with the radiologist interpretation If any imaging was obtained with contrast I closely monitored patient for any possible adverse reaction a/w contrast administration in the emergency department   Medicines ordered and prescription drug management: Meds ordered  this encounter  Medications   LORazepam  (ATIVAN ) 2 MG/ML injection    Ore, Hunter J: cabinet override   levETIRAcetam  (KEPPRA ) undiluted injection 1,000 mg   iohexol  (OMNIPAQUE ) 350 MG/ML injection 100 mL    -I have reviewed the patients home medicines and have made adjustments as needed    Co morbidities that complicate the patient evaluation  Past Medical History:  Diagnosis Date   ACUTE URIS OF UNSPECIFIED SITE 11/26/2008   ALLERGIC ARTHRITIS OTHER SPECIFIED SITES 10/22/2010   OA + RA   ALLERGIC RHINITIS 10/28/2007   ARTHRITIS, KNEES, BILATERAL 10/28/2007   BACK PAIN 04/12/2009   BUNIONECTOMY, HX OF 10/28/2007   CELLULITIS&ABSCESS OF HAND EXCEPT FINGERS&THUMB 10/23/2010   Complication of anesthesia    itching and makes me crazy   Family history of anesthesia complication    DAUGHTER HAD HIVES AND  WENT WILD    HYPERLIPIDEMIA 10/28/2007   HYPERTENSION  10/28/2007   INSOMNIA UNSPECIFIED 10/31/2007   MYOSITIS 10/22/2010   NECK PAIN 12/03/2008   Pain in joint, site unspecified 10/07/2010   Stroke (HCC) 06/23/2023   WRIST PAIN, LEFT 10/22/2010      Dispostion: Disposition decision including need for hospitalization was considered, and patient admitted to the hospital.   Final Clinical Impression(s) / ED Diagnoses Final diagnoses:  None        Bernadine Manos, MD 06/06/24 [id number]    Armenta Canning, MD 06/08/24 1733

## 2024-06-07 ENCOUNTER — Inpatient Hospital Stay (HOSPITAL_COMMUNITY)

## 2024-06-07 DIAGNOSIS — G40901 Epilepsy, unspecified, not intractable, with status epilepticus: Secondary | ICD-10-CM | POA: Diagnosis not present

## 2024-06-07 DIAGNOSIS — R262 Difficulty in walking, not elsewhere classified: Secondary | ICD-10-CM | POA: Diagnosis not present

## 2024-06-07 DIAGNOSIS — R569 Unspecified convulsions: Secondary | ICD-10-CM | POA: Diagnosis not present

## 2024-06-07 DIAGNOSIS — Z79899 Other long term (current) drug therapy: Secondary | ICD-10-CM | POA: Diagnosis not present

## 2024-06-07 DIAGNOSIS — Z8673 Personal history of transient ischemic attack (TIA), and cerebral infarction without residual deficits: Secondary | ICD-10-CM | POA: Diagnosis not present

## 2024-06-07 DIAGNOSIS — M069 Rheumatoid arthritis, unspecified: Secondary | ICD-10-CM

## 2024-06-07 DIAGNOSIS — R29818 Other symptoms and signs involving the nervous system: Secondary | ICD-10-CM | POA: Diagnosis not present

## 2024-06-07 DIAGNOSIS — Z803 Family history of malignant neoplasm of breast: Secondary | ICD-10-CM | POA: Diagnosis not present

## 2024-06-07 DIAGNOSIS — N179 Acute kidney failure, unspecified: Secondary | ICD-10-CM

## 2024-06-07 DIAGNOSIS — Z741 Need for assistance with personal care: Secondary | ICD-10-CM | POA: Diagnosis not present

## 2024-06-07 DIAGNOSIS — N390 Urinary tract infection, site not specified: Secondary | ICD-10-CM | POA: Diagnosis present

## 2024-06-07 DIAGNOSIS — Z7401 Bed confinement status: Secondary | ICD-10-CM | POA: Diagnosis not present

## 2024-06-07 DIAGNOSIS — Z8041 Family history of malignant neoplasm of ovary: Secondary | ICD-10-CM | POA: Diagnosis not present

## 2024-06-07 DIAGNOSIS — E876 Hypokalemia: Secondary | ICD-10-CM | POA: Diagnosis present

## 2024-06-07 DIAGNOSIS — M6259 Muscle wasting and atrophy, not elsewhere classified, multiple sites: Secondary | ICD-10-CM | POA: Diagnosis not present

## 2024-06-07 DIAGNOSIS — G40409 Other generalized epilepsy and epileptic syndromes, not intractable, without status epilepticus: Secondary | ICD-10-CM | POA: Diagnosis not present

## 2024-06-07 DIAGNOSIS — E1122 Type 2 diabetes mellitus with diabetic chronic kidney disease: Secondary | ICD-10-CM | POA: Diagnosis present

## 2024-06-07 DIAGNOSIS — Z801 Family history of malignant neoplasm of trachea, bronchus and lung: Secondary | ICD-10-CM | POA: Diagnosis not present

## 2024-06-07 DIAGNOSIS — Z7902 Long term (current) use of antithrombotics/antiplatelets: Secondary | ICD-10-CM | POA: Diagnosis not present

## 2024-06-07 DIAGNOSIS — H53462 Homonymous bilateral field defects, left side: Secondary | ICD-10-CM | POA: Diagnosis present

## 2024-06-07 DIAGNOSIS — I69398 Other sequelae of cerebral infarction: Secondary | ICD-10-CM | POA: Diagnosis present

## 2024-06-07 DIAGNOSIS — Z886 Allergy status to analgesic agent status: Secondary | ICD-10-CM | POA: Diagnosis not present

## 2024-06-07 DIAGNOSIS — H409 Unspecified glaucoma: Secondary | ICD-10-CM | POA: Diagnosis present

## 2024-06-07 DIAGNOSIS — G2581 Restless legs syndrome: Secondary | ICD-10-CM | POA: Diagnosis present

## 2024-06-07 DIAGNOSIS — G4089 Other seizures: Secondary | ICD-10-CM | POA: Diagnosis not present

## 2024-06-07 DIAGNOSIS — G40401 Other generalized epilepsy and epileptic syndromes, not intractable, with status epilepticus: Secondary | ICD-10-CM | POA: Diagnosis present

## 2024-06-07 DIAGNOSIS — K219 Gastro-esophageal reflux disease without esophagitis: Secondary | ICD-10-CM | POA: Diagnosis present

## 2024-06-07 DIAGNOSIS — Z96653 Presence of artificial knee joint, bilateral: Secondary | ICD-10-CM | POA: Diagnosis present

## 2024-06-07 DIAGNOSIS — N1832 Chronic kidney disease, stage 3b: Secondary | ICD-10-CM | POA: Diagnosis present

## 2024-06-07 DIAGNOSIS — Z8249 Family history of ischemic heart disease and other diseases of the circulatory system: Secondary | ICD-10-CM | POA: Diagnosis not present

## 2024-06-07 DIAGNOSIS — E538 Deficiency of other specified B group vitamins: Secondary | ICD-10-CM | POA: Diagnosis present

## 2024-06-07 DIAGNOSIS — Z1152 Encounter for screening for COVID-19: Secondary | ICD-10-CM | POA: Diagnosis not present

## 2024-06-07 DIAGNOSIS — I129 Hypertensive chronic kidney disease with stage 1 through stage 4 chronic kidney disease, or unspecified chronic kidney disease: Secondary | ICD-10-CM | POA: Diagnosis present

## 2024-06-07 DIAGNOSIS — E119 Type 2 diabetes mellitus without complications: Secondary | ICD-10-CM | POA: Diagnosis not present

## 2024-06-07 DIAGNOSIS — E785 Hyperlipidemia, unspecified: Secondary | ICD-10-CM | POA: Diagnosis present

## 2024-06-07 LAB — GLUCOSE, CAPILLARY
Glucose-Capillary: 134 mg/dL — ABNORMAL HIGH (ref 70–99)
Glucose-Capillary: 77 mg/dL (ref 70–99)
Glucose-Capillary: 94 mg/dL (ref 70–99)
Glucose-Capillary: 109 mg/dL — ABNORMAL HIGH (ref 70–99)
Glucose-Capillary: 198 mg/dL — ABNORMAL HIGH (ref 70–99)

## 2024-06-07 LAB — URINALYSIS, ROUTINE W REFLEX MICROSCOPIC
Bilirubin Urine: NEGATIVE
Glucose, UA: NEGATIVE mg/dL
Hgb urine dipstick: NEGATIVE
Ketones, ur: NEGATIVE mg/dL
Nitrite: POSITIVE — AB
Protein, ur: 30 mg/dL — AB
Specific Gravity, Urine: 1.044 — ABNORMAL HIGH (ref 1.005–1.030)
pH: 6 (ref 5.0–8.0)

## 2024-06-07 LAB — BASIC METABOLIC PANEL WITH GFR
Anion gap: 9 (ref 5–15)
BUN: 15 mg/dL (ref 8–23)
CO2: 25 mmol/L (ref 22–32)
Calcium: 9.1 mg/dL (ref 8.9–10.3)
Chloride: 106 mmol/L (ref 98–111)
Creatinine, Ser: 0.97 mg/dL (ref 0.44–1.00)
GFR, Estimated: 56 mL/min — ABNORMAL LOW (ref >60)
Glucose, Bld: 89 mg/dL (ref 70–99)
Potassium: 2.9 mmol/L — ABNORMAL LOW (ref 3.5–5.1)
Sodium: 140 mmol/L (ref 135–145)

## 2024-06-07 LAB — MRSA NEXT GEN BY PCR, NASAL: MRSA by PCR Next Gen: NOT DETECTED

## 2024-06-07 LAB — TROPONIN T, HIGH SENSITIVITY: Troponin T High Sensitivity: 28 ng/L — ABNORMAL HIGH (ref 0–19)

## 2024-06-07 LAB — CBC
HCT: 33 % — ABNORMAL LOW (ref 36.0–46.0)
Hemoglobin: 11.4 g/dL — ABNORMAL LOW (ref 12.0–15.0)
MCH: 30.6 pg (ref 26.0–34.0)
MCHC: 34.5 g/dL (ref 30.0–36.0)
MCV: 88.5 fL (ref 80.0–100.0)
Platelets: 189 K/uL (ref 150–400)
RBC: 3.73 MIL/uL — ABNORMAL LOW (ref 3.87–5.11)
RDW: 16.5 % — ABNORMAL HIGH (ref 11.5–15.5)
WBC: 10.9 K/uL — ABNORMAL HIGH (ref 4.0–10.5)
nRBC: 0 % (ref 0.0–0.2)

## 2024-06-07 LAB — AMYLASE: Amylase: 68 U/L (ref 28–100)

## 2024-06-07 LAB — PHOSPHORUS: Phosphorus: 4.7 mg/dL — ABNORMAL HIGH (ref 2.5–4.6)

## 2024-06-07 LAB — LIPASE, BLOOD: Lipase: 34 U/L (ref 11–51)

## 2024-06-07 LAB — MAGNESIUM: Magnesium: 2.1 mg/dL (ref 1.7–2.4)

## 2024-06-07 MED ORDER — ACETAMINOPHEN 325 MG PO TABS
650.0000 mg | ORAL_TABLET | Freq: Three times a day (TID) | ORAL | Status: DC | PRN
  Administered 2024-06-07 – 2024-06-08 (×2): 650 mg via ORAL
  Filled 2024-06-07 (×2): qty 2

## 2024-06-07 MED ORDER — INSULIN ASPART 100 UNIT/ML IJ SOLN
0.0000 [IU] | INTRAMUSCULAR | Status: DC
  Administered 2024-06-07: 1 [IU] via SUBCUTANEOUS
  Administered 2024-06-07: 2 [IU] via SUBCUTANEOUS
  Administered 2024-06-08 (×2): 1 [IU] via SUBCUTANEOUS

## 2024-06-07 MED ORDER — LEVETIRACETAM 500 MG PO TABS
500.0000 mg | ORAL_TABLET | Freq: Two times a day (BID) | ORAL | Status: DC
  Administered 2024-06-07 – 2024-06-10 (×6): 500 mg via ORAL
  Filled 2024-06-07 (×6): qty 1

## 2024-06-07 MED ORDER — LACTATED RINGERS IV SOLN: INTRAVENOUS | Status: DC

## 2024-06-07 MED ORDER — HEPARIN SODIUM (PORCINE) 5000 UNIT/ML IJ SOLN
5000.0000 [IU] | Freq: Three times a day (TID) | INTRAMUSCULAR | Status: DC
  Administered 2024-06-07 – 2024-06-10 (×10): 5000 [IU] via SUBCUTANEOUS
  Filled 2024-06-07 (×10): qty 1

## 2024-06-07 MED ORDER — POTASSIUM CHLORIDE CRYS ER 20 MEQ PO TBCR
40.0000 meq | EXTENDED_RELEASE_TABLET | ORAL | Status: AC
  Administered 2024-06-07 (×2): 40 meq via ORAL
  Filled 2024-06-07 (×2): qty 2

## 2024-06-07 MED ORDER — VITAMIN B-12 1000 MCG PO TABS
1000.0000 ug | ORAL_TABLET | Freq: Every day | ORAL | Status: DC
  Administered 2024-06-07 – 2024-06-10 (×4): 1000 ug via ORAL
  Filled 2024-06-07 (×4): qty 1

## 2024-06-07 MED ORDER — ROPINIROLE HCL 0.25 MG PO TABS
0.2500 mg | ORAL_TABLET | Freq: Every evening | ORAL | Status: DC | PRN
  Administered 2024-06-07: 0.25 mg via ORAL
  Filled 2024-06-07 (×2): qty 1

## 2024-06-07 MED ORDER — POLYETHYLENE GLYCOL 3350 17 G PO PACK: 17.0000 g | PACK | Freq: Every day | ORAL | Status: DC | PRN

## 2024-06-07 MED ORDER — LEVETIRACETAM (KEPPRA) 500 MG/5 ML ADULT IV PUSH
500.0000 mg | Freq: Two times a day (BID) | INTRAVENOUS | Status: DC
  Administered 2024-06-07: 500 mg via INTRAVENOUS
  Filled 2024-06-07 (×2): qty 5

## 2024-06-07 MED ORDER — ORAL CARE MOUTH RINSE
15.0000 mL | OROMUCOSAL | Status: DC | PRN
  Filled 2024-06-07: qty 15

## 2024-06-07 MED ORDER — DORZOLAMIDE HCL-TIMOLOL MAL 2-0.5 % OP SOLN
1.0000 [drp] | Freq: Two times a day (BID) | OPHTHALMIC | Status: DC
  Administered 2024-06-07 – 2024-06-10 (×7): 1 [drp] via OPHTHALMIC
  Filled 2024-06-07: qty 10

## 2024-06-07 MED ORDER — CLOPIDOGREL BISULFATE 75 MG PO TABS
75.0000 mg | ORAL_TABLET | Freq: Every day | ORAL | Status: DC
  Administered 2024-06-07 – 2024-06-10 (×4): 75 mg via ORAL
  Filled 2024-06-07 (×4): qty 1

## 2024-06-07 MED ORDER — LATANOPROST 0.005 % OP SOLN
1.0000 [drp] | Freq: Every day | OPHTHALMIC | Status: DC
  Filled 2024-06-07: qty 2.5

## 2024-06-07 MED ORDER — HYDROXYCHLOROQUINE SULFATE 200 MG PO TABS
200.0000 mg | ORAL_TABLET | Freq: Two times a day (BID) | ORAL | Status: DC
  Administered 2024-06-07 – 2024-06-10 (×7): 200 mg via ORAL
  Filled 2024-06-07 (×9): qty 1

## 2024-06-07 MED ORDER — SODIUM CHLORIDE 0.9 % IV SOLN
2.0000 g | INTRAVENOUS | Status: DC
  Administered 2024-06-07 – 2024-06-08 (×2): 2 g via INTRAVENOUS
  Filled 2024-06-07 (×2): qty 20

## 2024-06-07 MED ORDER — DOCUSATE SODIUM 100 MG PO CAPS: 100.0000 mg | ORAL_CAPSULE | Freq: Two times a day (BID) | ORAL | Status: DC | PRN

## 2024-06-07 MED ORDER — LEVETIRACETAM (KEPPRA) 500 MG/5 ML ADULT IV PUSH: 3000.0000 mg | Freq: Once | INTRAVENOUS | Status: DC

## 2024-06-07 MED ORDER — CHLORHEXIDINE GLUCONATE CLOTH 2 % EX PADS
6.0000 | MEDICATED_PAD | Freq: Every day | CUTANEOUS | Status: DC
  Administered 2024-06-07 – 2024-06-09 (×3): 6 via TOPICAL
  Filled 2024-06-07: qty 6

## 2024-06-07 MED ORDER — NETARSUDIL DIMESYLATE 0.02 % OP SOLN: 1.0000 [drp] | Freq: Every day | OPHTHALMIC | Status: DC

## 2024-06-07 MED ORDER — NETARSUDIL-LATANOPROST 0.02-0.005 % OP SOLN
1.0000 [drp] | Freq: Every day | OPHTHALMIC | Status: DC
  Administered 2024-06-07 – 2024-06-09 (×3): 1 [drp] via OPHTHALMIC

## 2024-06-07 MED ORDER — LEVETIRACETAM (KEPPRA) 500 MG/5 ML ADULT IV PUSH
3000.0000 mg | Freq: Once | INTRAVENOUS | Status: AC
  Administered 2024-06-07: 3000 mg via INTRAVENOUS
  Filled 2024-06-07: qty 30

## 2024-06-07 MED ORDER — PANTOPRAZOLE SODIUM 40 MG PO TBEC
40.0000 mg | DELAYED_RELEASE_TABLET | Freq: Two times a day (BID) | ORAL | Status: DC
  Administered 2024-06-07 – 2024-06-10 (×7): 40 mg via ORAL
  Filled 2024-06-07 (×7): qty 1

## 2024-06-07 MED ORDER — AMLODIPINE BESYLATE 5 MG PO TABS
10.0000 mg | ORAL_TABLET | Freq: Every day | ORAL | Status: DC
  Administered 2024-06-07 – 2024-06-10 (×4): 10 mg via ORAL
  Filled 2024-06-07: qty 2
  Filled 2024-06-07: qty 1
  Filled 2024-06-07 (×2): qty 2

## 2024-06-07 MED ORDER — ATORVASTATIN CALCIUM 40 MG PO TABS
40.0000 mg | ORAL_TABLET | Freq: Every day | ORAL | Status: DC
  Administered 2024-06-07 – 2024-06-10 (×4): 40 mg via ORAL
  Filled 2024-06-07 (×5): qty 1

## 2024-06-07 NOTE — Progress Notes (Signed)
 06/07/2024 Seen and examined. Awaiting EEG. Ordering MRI UA is neg WBC but + nitrite and hazy, will give a couple days abx in context of entire hx Will update family. Does not seem to have any ICU needs, likely to floor later today; PT/OT etc  Rolan Sharps MD PCCM

## 2024-06-07 NOTE — ED Notes (Signed)
 Pt did respond when moving her to the [facility] stretcher.  Moving all extremities

## 2024-06-07 NOTE — Plan of Care (Signed)

## 2024-06-07 NOTE — H&P (Signed)
 NAME:  Briana [name], MRN:  [id number], DOB:  10-15-1930, LOS: 0 ADMISSION DATE:  06/06/2024, CONSULTATION DATE:  06/07/24 REFERRING MD:  [place] - [facility] CHIEF COMPLAINT:  Seizures   History of Present Illness:  Briana [name] is a 89 y.o. female who has a PMH as outlined below.  She saw her rheumatologist for routine appointment on 8/26 and following this appointment, she had progressive left-sided abdominal pain and generalized weakness which prompted her to seek medical attention.  [facility] where she had a left leg tremor that was spreading into her back followed by a witnessed seizure episode.  She received 1 mg of Ativan  with resolution; however, jerking of the left lower extremity returned.  She had an additional 1 mg of Ativan  with full resolution.  Head CT was obtained and showed generalized cerebral atrophy with chronic known infarcts and no acute process.  CTA chest/abdomen/pelvis was also obtained and showed no acute process.  Pertinent  Medical History:  has Hyperlipidemia with target LDL less than 130; Seasonal allergic rhinitis; DJD (degenerative joint disease) of knee; INSOMNIA UNSPECIFIED; Encounter for general adult medical examination with abnormal findings; Essential hypertension, benign; RA (rheumatoid arthritis) (HCC); Constipation due to opioid therapy; Left lumbar radiculitis; Vertigo, benign paroxysmal, bilateral; Type II diabetes mellitus with manifestations (HCC); Stage 3b chronic kidney disease (HCC); Body mass index (BMI) 31.0-31.9, adult; Other long term (current) drug therapy; Sciatica; Allergic contact dermatitis due to drugs in contact with skin; Abnormal electrocardiogram (ECG) (EKG); Stroke (cerebrum) (HCC); Nausea; and Status epilepticus (HCC) on their problem list.  Significant Hospital Events: Including procedures, antibiotic start and stop dates in addition to other pertinent events   8/27 admit  Interim History / Subjective:   Follows basic commands. Vitals stable.  Objective:  Blood pressure (!) 152/68, pulse 68, temperature 97.7 F (36.5 C), temperature source Oral, resp. rate (!) 24, height 5' 4 (1.626 m), weight 66.6 kg, SpO2 97%.       No intake or output data in the 24 hours ending 06/07/24 0314 Filed Weights   06/06/24 2045 06/07/24 0245  Weight: 67.6 kg 66.6 kg     Physical Exam: General: Elderly female, resting in bed, in NAD. Neuro: Slow to respond but will follow basic commands with prompting. Moving extremities though slightly weaker on left. HEENT: [place]/AT. Sclerae anicteric. EOMI. Cardiovascular: RRR, no M/R/G.  Lungs: Respirations even and unlabored.  CTA bilaterally, No W/R/R. Abdomen: BS x 4, soft, NT/ND.  Musculoskeletal: No gross deformities, no edema.    Labs/imaging personally reviewed:  CT head 8/27 > generalized cerebral atrophy with chronic known infarcts and no acute process. CTA C/A/P 8/27 > no acute process.  Assessment & Plan:   Status epilepticus - unclear etiology at this point. S/p 2mg  Ativan  and 4g Keppra  in ED. - Neuro following, appreciate the assistance. - No need for EEG at this point. - MRI per neuro. - AED's per neuro, Keppra  500mg  BID. - Follow UA.  AKI. - Gentle fluids. - Follow BMP.  Left-sided abdominal pain - unclear etiology, CT C/A/P negative. - Add on lipase, amylase.  Hx HTN, HLD. - Continue PTA amlodipine , atorvastatin , clopidogrel . - Hold PTA atenolol  for now  Hx rheumatoid arthritis. - Continue Droxia chloroquine. - Follow-up as outpatient.  ? RLS. - Low dose Requip .  Best practice (evaluated daily):  Diet/type: Regular consistency (see orders) DVT prophylaxis: prophylactic heparin   Pressure ulcer(s): pressure ulcer assessment deferred  GI prophylaxis: N/A Lines: N/A Foley:  N/A Code Status:  full code Last date of multidisciplinary goals of care discussion: None yet.  Labs   CBC: Recent Labs  Lab 06/06/24 2136  WBC  10.7*  NEUTROABS 7.7  HGB 13.4  HCT 39.1  MCV 89.7  PLT 205    Basic Metabolic Panel: Recent Labs  Lab 06/06/24 2136  NA 139  K 4.0  CL 103  CO2 18*  GLUCOSE 148*  BUN 19  CREATININE 1.13*  CALCIUM  10.2  MG 2.0   GFR: Estimated Creatinine Clearance: 31.1 mL/min (A) (by C-G formula based on SCr of 1.13 mg/dL (H)). Recent Labs  Lab 06/06/24 2136  WBC 10.7*    Liver Function Tests: Recent Labs  Lab 06/06/24 2136  AST 47*  ALT 64*  ALKPHOS 149*  BILITOT 0.9  PROT 8.2*  ALBUMIN 4.6   No results for input(s): LIPASE, AMYLASE in the last 168 hours. No results for input(s): AMMONIA in the last 168 hours.  ABG No results found for: PHART, PCO2ART, PO2ART, HCO3, TCO2, ACIDBASEDEF, O2SAT   Coagulation Profile: No results for input(s): INR, PROTIME in the last 168 hours.  Cardiac Enzymes: No results for input(s): CKTOTAL, CKMB, CKMBINDEX, TROPONINI in the last 168 hours.  HbA1C: Hgb A1c MFr Bld  Date/Time Value Ref Range Status  06/23/2023 11:13 PM 6.3 (H) 4.8 - 5.6 % Final    Comment:    (NOTE)         Prediabetes: 5.7 - 6.4         Diabetes: >6.4         Glycemic control for adults with diabetes: <7.0   01/07/2023 10:31 AM 6.0 4.6 - 6.5 % Final    Comment:    Glycemic Control Guidelines for People with Diabetes:Non Diabetic:  <6%Goal of Therapy: <7%Additional Action Suggested:  >8%     CBG: Recent Labs  Lab 06/06/24 2150  GLUCAP 152*    Review of Systems:   Unable to obtain at this time.  Past Medical History:  She,  has a past medical history of ACUTE URIS OF UNSPECIFIED SITE (11/26/2008), ALLERGIC ARTHRITIS OTHER SPECIFIED SITES (10/22/2010), ALLERGIC RHINITIS (10/28/2007), ARTHRITIS, KNEES, BILATERAL (10/28/2007), BACK PAIN (04/12/2009), BUNIONECTOMY, HX OF (10/28/2007), CELLULITIS&ABSCESS OF HAND EXCEPT FINGERS&THUMB (10/23/2010), Complication of anesthesia, Family history of anesthesia complication,  HYPERLIPIDEMIA (10/28/2007), HYPERTENSION (10/28/2007), INSOMNIA UNSPECIFIED (10/31/2007), MYOSITIS (10/22/2010), NECK PAIN (12/03/2008), Pain in joint, site unspecified (10/07/2010), Stroke (HCC) (06/23/2023), and WRIST PAIN, LEFT (10/22/2010).   Surgical History:   Past Surgical History:  Procedure Laterality Date   arthroscopic surgery and rotator cuff repair left shoulder     arthroscopy right knee hx of surgery     BUNIONECTOMY     hx of   FOOT SURGERY     RIGHT   hand surgery for broken finger, remote     JOINT REPLACEMENT Right    knee   TOTAL KNEE ARTHROPLASTY  07/01/2012   Procedure: TOTAL KNEE ARTHROPLASTY;  Surgeon: LELON JONETTA Shari Mickey., MD;  Location: [facility] OR;  Service: Orthopedics;  Laterality: Right;  right total knee arthroplasty   TOTAL KNEE ARTHROPLASTY Left 06/13/2014   dr beverley   TOTAL KNEE ARTHROPLASTY Left 06/13/2014   Procedure: TOTAL KNEE ARTHROPLASTY;  Surgeon: Toribio JULIANNA beverley, MD;  Location: [facility] OR;  Service: Orthopedics;  Laterality: Left;     Social History:   reports that she has never smoked. She has never used smokeless tobacco. She reports that she does not drink alcohol and does not use  drugs.   Family History:  Her family history includes Breast cancer in her son; Coronary artery disease in an other family member; Heart attack in her father and another family member; High blood pressure in her daughter, son, son, son, and son; Lung cancer in her brother; Ovarian cancer in her sister. There is no history of Colon cancer or Endometrial cancer.   Allergies Allergies  Allergen Reactions   Ibuprofen  Anaphylaxis   Percocet [Oxycodone -Acetaminophen ]     nausea   Simvastatin  Other (See Comments)    muscle aches   2,4-D Dimethylamine Other (See Comments)   Diltiazem Hcl     REACTION: ANGIO EDEMA   Hydrochlorothiazide     REACTION: SWELLING   Lisinopril     REACTION: SWELLING   Valacyclovir  Other (See Comments)   Verapamil     REACTION: SWELLING     Home  Medications  Prior to Admission medications   Medication Sig Start Date End Date Taking? Authorizing Provider  acetaminophen  (TYLENOL ) 650 MG CR tablet Take 650 mg by mouth every 8 (eight) hours as needed for pain.    [provider]  amLODipine  (NORVASC ) 10 MG tablet TAKE 1 TABLET EVERY DAY 05/25/24   Sherlynn Madden, MD  atenolol  (TENORMIN ) 50 MG tablet Take 1 tablet (50 mg total) by mouth daily. 04/12/24   Sherlynn Madden, MD  atorvastatin  (LIPITOR) 40 MG tablet Take 1 tablet (40 mg total) by mouth daily. 08/11/23 04/12/24  Sherlynn Madden, MD  b complex vitamins tablet Take 1 tablet by mouth daily.    [provider]  Cholecalciferol (VITAMIN D) 2000 UNITS tablet Take 2,000 Units by mouth daily.    [provider]  clopidogrel  (PLAVIX ) 75 MG tablet Take 1 tablet (75 mg total) by mouth daily. 08/11/23   Sherlynn Madden, MD  cyanocobalamin  (VITAMIN B12) 1000 MCG tablet Take 1,000 mcg by mouth daily.    [provider]  dorzolamide -timolol  (COSOPT ) 22.3-6.8 MG/ML ophthalmic solution Place 1 drop into both eyes 2 (two) times daily.    [provider]  fexofenadine (ALLEGRA) 180 MG tablet Take 180 mg by mouth as needed for allergies.    [provider]  hydroxychloroquine  (PLAQUENIL ) 200 MG tablet Take 200 mg by mouth 2 (two) times daily.    Ishmael Slough, MD  latanoprost  (XALATAN ) 0.005 % ophthalmic solution Place 1 drop into both eyes at bedtime.    [provider]  Netarsudil  Dimesylate (RHOPRESSA ) 0.02 % SOLN Place 1 drop into the right eye at bedtime.    [provider]  pantoprazole  (PROTONIX ) 40 MG tablet TAKE 1 TABLET TWICE DAILY 05/25/24   Veludandi, Prashanthi, MD  polyethylene glycol powder (GLYCOLAX /MIRALAX ) 17 GM/SCOOP powder Take 1 Container by mouth once.    [provider]     Critical care time: N/A   Sammi Gore, PA - C [facility] For pager  details, please see AMION or use Epic chat  After 1900, please call [facility]INK for cross coverage needs 06/07/2024, 3:14 AM

## 2024-06-07 NOTE — Consult Note (Signed)
 NEUROLOGY CONSULT NOTE   Date of service: June 07, 2024 Patient Name: Briana [name] MRN:  [id number] DOB:  1931-08-02 Chief Complaint: seizure Requesting Provider: Maree Harder, MD  History of Present Illness  Briana [name] is a 89 y.o. female with hx of HTN, HLD, R parietal lobe cortical stroke who presented to [facility] emergency department with left sided pain and generalized weakness. She developed L leg jerking that spread to her back and then in the ED, she had a GTC seizure in the ED and was obtunded and unresponsive to sternal rub per notes.  She was given Ativan  2mg  and loaded with Keppra  1000mg  IV once. She was transferred to [facility] Neuro ICU for close monitoring.  She appears improved here. Is oriented to self, slightly weak on the left compared to right but moving all extremities and following commands.   ROS  Unable to ascertain due to drowsiness.  Past History   Past Medical History:  Diagnosis Date   ACUTE URIS OF UNSPECIFIED SITE 11/26/2008   ALLERGIC ARTHRITIS OTHER SPECIFIED SITES 10/22/2010   OA + RA   ALLERGIC RHINITIS 10/28/2007   ARTHRITIS, KNEES, BILATERAL 10/28/2007   BACK PAIN 04/12/2009   BUNIONECTOMY, HX OF 10/28/2007   CELLULITIS&ABSCESS OF HAND EXCEPT FINGERS&THUMB 10/23/2010   Complication of anesthesia    itching and makes me crazy   Family history of anesthesia complication    DAUGHTER HAD HIVES AND  WENT WILD    HYPERLIPIDEMIA 10/28/2007   HYPERTENSION 10/28/2007   INSOMNIA UNSPECIFIED 10/31/2007   MYOSITIS 10/22/2010   NECK PAIN 12/03/2008   Pain in joint, site unspecified 10/07/2010   Stroke (HCC) 06/23/2023   WRIST PAIN, LEFT 10/22/2010    Past Surgical History:  Procedure Laterality Date   arthroscopic surgery and rotator cuff repair left shoulder     arthroscopy right knee hx of surgery     BUNIONECTOMY     hx of   FOOT SURGERY     RIGHT   hand surgery for broken finger, remote     JOINT REPLACEMENT  Right    knee   TOTAL KNEE ARTHROPLASTY  07/01/2012   Procedure: TOTAL KNEE ARTHROPLASTY;  Surgeon: LELON JONETTA Shari Mickey., MD;  Location: [facility] OR;  Service: Orthopedics;  Laterality: Right;  right total knee arthroplasty   TOTAL KNEE ARTHROPLASTY Left 06/13/2014   dr beverley   TOTAL KNEE ARTHROPLASTY Left 06/13/2014   Procedure: TOTAL KNEE ARTHROPLASTY;  Surgeon: Toribio JULIANNA beverley, MD;  Location: [facility] OR;  Service: Orthopedics;  Laterality: Left;    Family History: Family History  Problem Relation Age of Onset   Heart attack Father    Ovarian cancer Sister    Lung cancer Brother    High blood pressure Daughter    High blood pressure Son    Breast cancer Son    High blood pressure Son    High blood pressure Son    High blood pressure Son    Coronary artery disease Other    Heart attack Other    Colon cancer Neg Hx    Endometrial cancer Neg Hx     Social History  reports that she has never smoked. She has never used smokeless tobacco. She reports that she does not drink alcohol and does not use drugs.  Allergies  Allergen Reactions   Ibuprofen  Anaphylaxis   Percocet [Oxycodone -Acetaminophen ]     nausea   Simvastatin  Other (See Comments)    muscle aches  2,4-D Dimethylamine Other (See Comments)   Diltiazem Hcl     REACTION: ANGIO EDEMA   Hydrochlorothiazide     REACTION: SWELLING   Lisinopril     REACTION: SWELLING   Valacyclovir  Other (See Comments)   Verapamil     REACTION: SWELLING    Medications   Current Facility-Administered Medications:    acetaminophen  (TYLENOL ) CR tablet 650 mg, 650 mg, Oral, Q8H PRN, Desai, Rahul P, PA-C   amLODipine  (NORVASC ) tablet 10 mg, 10 mg, Oral, Daily, Desai, Rahul P, PA-C   atorvastatin  (LIPITOR) tablet 40 mg, 40 mg, Oral, Daily, Desai, Rahul P, PA-C   Chlorhexidine  Gluconate Cloth 2 % PADS 6 each, 6 each, Topical, Daily, Maree Harder, MD   clopidogrel  (PLAVIX ) tablet 75 mg, 75 mg, Oral, Daily, Desai, Rahul P, PA-C   cyanocobalamin  (VITAMIN  B12) tablet 1,000 mcg, 1,000 mcg, Oral, Daily, Desai, Rahul P, PA-C   docusate sodium  (COLACE) capsule 100 mg, 100 mg, Oral, BID PRN, Desai, Rahul P, PA-C   dorzolamide -timolol  (COSOPT ) 2-0.5 % ophthalmic solution 1 drop, 1 drop, Both Eyes, BID, Desai, Rahul P, PA-C   heparin  injection 5,000 Units, 5,000 Units, Subcutaneous, Q8H, Desai, Rahul P, PA-C   hydroxychloroquine  (PLAQUENIL ) tablet 200 mg, 200 mg, Oral, BID, Desai, Rahul P, PA-C   lactated ringers  infusion, , Intravenous, Continuous, Desai, Rahul P, PA-C   latanoprost  (XALATAN ) 0.005 % ophthalmic solution 1 drop, 1 drop, Both Eyes, QHS, Desai, Rahul P, PA-C   Netarsudil  Dimesylate 0.02 % SOLN 1 drop, 1 drop, Right Eye, QHS, Desai, Rahul P, PA-C   Oral care mouth rinse, 15 mL, Mouth Rinse, PRN, Maree Harder, MD   pantoprazole  (PROTONIX ) EC tablet 40 mg, 40 mg, Oral, BID, Desai, Rahul P, PA-C   polyethylene glycol (MIRALAX  / GLYCOLAX ) packet 17 g, 17 g, Oral, Daily PRN, Desai, Rahul P, PA-C   rOPINIRole  (REQUIP ) tablet 0.25 mg, 0.25 mg, Oral, QHS, Desai, Rahul P, PA-C  Vitals   Vitals:   06/07/24 0100 06/07/24 0130 06/07/24 0245 06/07/24 0300  BP: (!) 137/59 (!) 148/62 (!) 147/90 (!) 152/68  Pulse: 63 65 74 68  Resp: (!) 22 (!) 23 (!) 33 (!) 24  Temp:  99 F (37.2 C) 97.7 F (36.5 C)   TempSrc:  Axillary Oral   SpO2: 98% 97% 95% 97%  Weight:   66.6 kg   Height:        Body mass index is 25.2 kg/m.   Physical Exam   General: Laying comfortably in bed; in no acute distress.  HENT: Normal oropharynx and mucosa. Normal external appearance of ears and nose.  Neck: Supple, no pain or tenderness  CV: No JVD. No peripheral edema.  Pulmonary: Symmetric Chest rise. Normal respiratory effort.  Abdomen: Soft to touch, non-tender.  Ext: No cyanosis, edema, or deformity  Skin: No rash. Normal palpation of skin.   Musculoskeletal: Normal digits and nails by inspection. No clubbing.   Neurologic Examination  Mental  status/Cognition: drowsy, oriented to self, place, thinks she is 89 years old. poor attention and requries constant stimulation to stay awake. Speech/language: dysarthric speech. Non fluent, comprehension intact Cranial nerves:   CN II Pupils equal and reactive to light,L hemianopsia   CN III,IV,VI R gaze preference, does cross midline barely.   CN V normal sensation in V1, V2, and V3 segments bilaterally   CN VII no asymmetry, no nasolabial fold flattening   CN VIII normal hearing to speech   CN IX & X normal palatal  elevation, no uvular deviation   CN XI 5/5 head turn and 5/5 shoulder shrug bilaterally   CN XII midline tongue protrusion   Sensory/Motor:  Muscle bulk: poor, tone normal Moves RUE and RLE more than LUE and LLE. Localizes in all extremities to proximal pinch.  Coordination/Complex Motor:  No obvious ataxia noted.  Labs/Imaging/Neurodiagnostic studies   CBC:  Recent Labs  Lab 26-Jun-2024 2136  WBC 10.7*  NEUTROABS 7.7  HGB 13.4  HCT 39.1  MCV 89.7  PLT 205   Basic Metabolic Panel:  Lab Results  Component Value Date   NA 139 06-26-2024   K 4.0 26-Jun-2024   CO2 18 (L) 26-Jun-2024   GLUCOSE 148 (H) 06/26/24   BUN 19 June 26, 2024   CREATININE 1.13 (H) 06/26/2024   CALCIUM  10.2 June 26, 2024   GFRNONAA 46 (L) 06/26/24   GFRAA 63 05/29/2020   Lipid Panel:  Lab Results  Component Value Date   LDLCALC 66 06/24/2023   HgbA1c:  Lab Results  Component Value Date   HGBA1C 6.3 (H) 06/23/2023   Urine Drug Screen: No results found for: LABOPIA, COCAINSCRNUR, LABBENZ, AMPHETMU, THCU, LABBARB  Alcohol Level No results found for: [facility] INR  Lab Results  Component Value Date   INR 0.92 06/05/2014   APTT  Lab Results  Component Value Date   APTT 27 06/05/2014   AED levels: No results found for: PHENYTOIN, ZONISAMIDE, LAMOTRIGINE, LEVETIRACETA  CT Head without contrast(Personally reviewed): CTH was negative for a large hypodensity  concerning for a large territory infarct or hyperdensity concerning for an ICH  MRI Brain: pending  Neurodiagnostics rEEG:  pending  ASSESSMENT   Briana [name] is a 89 y.o. female with hx of HTN, HLD, R parietal lobe cortical stroke who presented to [facility] emergency department with left sided pain and generalized weakness. She developed L leg jerking that spread to her back and then in the ED, she had a GTC seizure in the ED and was obtunded and unresponsive to sternal rub per notes.  She is improving and now able to follow commands and answer questions. She has L hemianopsia and R gaze preference and move RUE and RLE more than LUE and LLE.  No clinical seizure activity noted.  I suspect that the likely etiology of seizure is the prior R parietal lobe stroke.  RECOMMENDATIONS  - loaded with Keppra  60mg /Kg - continue Keppra  500mg  BID - routine EEG - MRI Brain w/o contrast routinely when able. ______________________________________________________________________    Signed, Maiah Sinning, MD [facility]

## 2024-06-07 NOTE — Progress Notes (Signed)
 EEG complete - results pending

## 2024-06-07 NOTE — ED Notes (Signed)
 Oxygen weaned from NRB to [place] at this time. Patient tolerating well. RN aware.

## 2024-06-07 NOTE — Procedures (Signed)
 Patient Name: Briana [name]  MRN: [id number]  Epilepsy Attending: Arlin MALVA Krebs  Referring Physician/Provider: Khaliqdina, Salman, MD  Date: 06/07/2024 Duration: 22.03 mins  Patient history: 89 y.o. female with hx of HTN, HLD, R parietal lobe cortical stroke who presented to [facility] emergency department with left sided pain and generalized weakness. She developed L leg jerking that spread to her back and then in the ED, she had a GTC seizure in the ED and was obtunded and unresponsive to sternal rub per notes. EEG to evaluate for seizure  Level of alertness: Awake  AEDs during EEG study: LEV  Technical aspects: This EEG study was done with scalp electrodes positioned according to the 10-20 International system of electrode placement. Electrical activity was reviewed with band pass filter of 1-70Hz , sensitivity of 7 uV/mm, display speed of 52mm/sec with a 60Hz  notched filter applied as appropriate. EEG data were recorded continuously and digitally stored.  Video monitoring was available and reviewed as appropriate.  Description: The posterior dominant rhythm consists of 8 Hz activity of moderate voltage (25-35 uV) seen predominantly in posterior head regions, symmetric and reactive to eye opening and eye closing. EEG showed continuous 3 to 6 Hz theta-delta slowing in right posterior quadrant. Hyperventilation and photic stimulation were not performed.     ABNORMALITY - Continuous slow, right posterior quadrant.   IMPRESSION: This study is suggestive of cortical dysfunction arising from right posterior quadrant likely secondary to underlying structural abnormality. No seizures or epileptiform discharges were seen throughout the recording.  [facility] O Kashay Cavenaugh

## 2024-06-07 NOTE — Progress Notes (Signed)
 Pharmacy Electrolyte Replacement  Recent Labs:  Recent Labs    06/07/24 0849  K 2.9*  MG 2.1  PHOS 4.7*  CREATININE 0.97    Low Critical Values (K </= 2.5, Phos </= 1, Mg </= 1) Present: None  MD Contacted: n/a   Plan: KCL 40 mEq PO q4h x 2 doses per protocol   Rankin Sams, PharmD, BCPS, BCCCP Clinical Pharmacist

## 2024-06-07 NOTE — Progress Notes (Signed)
 eLink Physician-Brief Progress Note Patient Name: Briana [name] DOB: January 15, 1931 MRN: [id number]   Date of Service  06/07/2024  HPI/Events of Note  Patient transferred from [facility] due to suspected Status Epilepticus.  eICU Interventions  New Patient Evaluation.        Loyd Salvador U Brainard Highfill 06/07/2024, 3:10 AM

## 2024-06-07 NOTE — ED Notes (Signed)
 Pt is moving extremities some and moaning when you move her extremities, responds to noxious stimuli.  Not verbally responsive at this time No seizure activity noted.   Family updated and at bedside.

## 2024-06-08 ENCOUNTER — Inpatient Hospital Stay (HOSPITAL_COMMUNITY)

## 2024-06-08 DIAGNOSIS — G40901 Epilepsy, unspecified, not intractable, with status epilepticus: Secondary | ICD-10-CM | POA: Diagnosis not present

## 2024-06-08 LAB — GLUCOSE, CAPILLARY
Glucose-Capillary: 100 mg/dL — ABNORMAL HIGH (ref 70–99)
Glucose-Capillary: 108 mg/dL — ABNORMAL HIGH (ref 70–99)
Glucose-Capillary: 125 mg/dL — ABNORMAL HIGH (ref 70–99)
Glucose-Capillary: 129 mg/dL — ABNORMAL HIGH (ref 70–99)
Glucose-Capillary: 139 mg/dL — ABNORMAL HIGH (ref 70–99)
Glucose-Capillary: 163 mg/dL — ABNORMAL HIGH (ref 70–99)
Glucose-Capillary: 83 mg/dL (ref 70–99)

## 2024-06-08 LAB — MAGNESIUM: Magnesium: 1.8 mg/dL (ref 1.7–2.4)

## 2024-06-08 LAB — CBC
HCT: 33 % — ABNORMAL LOW (ref 36.0–46.0)
Hemoglobin: 11.3 g/dL — ABNORMAL LOW (ref 12.0–15.0)
MCH: 30.5 pg (ref 26.0–34.0)
MCHC: 34.2 g/dL (ref 30.0–36.0)
MCV: 88.9 fL (ref 80.0–100.0)
Platelets: 176 K/uL (ref 150–400)
RBC: 3.71 MIL/uL — ABNORMAL LOW (ref 3.87–5.11)
RDW: 16.6 % — ABNORMAL HIGH (ref 11.5–15.5)
WBC: 10.7 K/uL — ABNORMAL HIGH (ref 4.0–10.5)
nRBC: 0 % (ref 0.0–0.2)

## 2024-06-08 LAB — PHOSPHORUS: Phosphorus: 3.3 mg/dL (ref 2.5–4.6)

## 2024-06-08 LAB — BASIC METABOLIC PANEL WITH GFR
Anion gap: 11 (ref 5–15)
BUN: 9 mg/dL (ref 8–23)
CO2: 23 mmol/L (ref 22–32)
Calcium: 9 mg/dL (ref 8.9–10.3)
Chloride: 107 mmol/L (ref 98–111)
Creatinine, Ser: 0.79 mg/dL (ref 0.44–1.00)
GFR, Estimated: >60 mL/min (ref >60)
Glucose, Bld: 106 mg/dL — ABNORMAL HIGH (ref 70–99)
Potassium: 3.8 mmol/L (ref 3.5–5.1)
Sodium: 141 mmol/L (ref 135–145)

## 2024-06-08 LAB — HEMOGLOBIN A1C
Hgb A1c MFr Bld: 6 % — ABNORMAL HIGH (ref 4.8–5.6)
Mean Plasma Glucose: 126 mg/dL

## 2024-06-08 MED ORDER — SODIUM CHLORIDE 0.9 % IV SOLN
2.0000 g | INTRAVENOUS | Status: AC
  Administered 2024-06-09: 2 g via INTRAVENOUS
  Filled 2024-06-08: qty 20

## 2024-06-08 MED ORDER — NETARSUDIL-LATANOPROST 0.02-0.005 % OP SOLN: 1.0000 [drp] | Freq: Every day | OPHTHALMIC | Status: DC

## 2024-06-08 MED ORDER — OXYCODONE HCL 5 MG PO TABS
5.0000 mg | ORAL_TABLET | Freq: Once | ORAL | Status: AC | PRN
  Administered 2024-06-08: 5 mg via ORAL
  Filled 2024-06-08: qty 1

## 2024-06-08 MED ORDER — ATENOLOL 25 MG PO TABS
50.0000 mg | ORAL_TABLET | Freq: Every day | ORAL | Status: DC
  Administered 2024-06-08 – 2024-06-10 (×3): 50 mg via ORAL
  Filled 2024-06-08 (×3): qty 2

## 2024-06-08 MED ORDER — ACETAMINOPHEN-CODEINE 300-30 MG PO TABS
1.0000 | ORAL_TABLET | ORAL | Status: DC | PRN
  Administered 2024-06-08: 1 via ORAL
  Administered 2024-06-08 – 2024-06-10 (×5): 2 via ORAL
  Filled 2024-06-08: qty 1
  Filled 2024-06-08 (×5): qty 2

## 2024-06-08 NOTE — TOC Initial Note (Signed)
 Transition of Care [facility]) - Initial/Assessment Note    Patient Details  Name: Briana [name] MRN: [id number] Date of Birth: 1931-03-15  Transition of Care [facility]) CM/SW Contact:    Andrez JULIANNA George, RN Phone Number: 06/08/2024, 11:47 AM  Clinical Narrative:                  Pt is from home with her son. He is retired and is with her most of the time.  Son provides needed transportation and manages her mediations.  Awaiting xray of her foot and therapy evals.  IP Care Management following.  Expected Discharge Plan:  (TBD) Barriers to Discharge: Continued Medical Work up   Patient Goals and CMS Choice            Expected Discharge Plan and Services       Living arrangements for the past 2 months: Single Family Home                                      Prior Living Arrangements/Services Living arrangements for the past 2 months: Single Family Home Lives with:: Adult Children Patient language and need for interpreter reviewed:: Yes Do you feel safe going back to the place where you live?: Yes        Care giver support system in place?: Yes (comment) Current home services: DME (walker/ cane/ shower seat/ BSC/ wheelchair) Criminal Activity/Legal Involvement Pertinent to Current Situation/Hospitalization: No - Comment as needed  Activities of Daily Living      Permission Sought/Granted                  Emotional Assessment Appearance:: Appears stated age Attitude/Demeanor/Rapport: Engaged Affect (typically observed): Accepting Orientation: : Oriented to Self, Oriented to Place, Oriented to  Time, Oriented to Situation   Psych Involvement: No (comment)  Admission diagnosis:  Status epilepticus (HCC) [G40.901] Patient Active Problem List   Diagnosis Date Noted   Status epilepticus (HCC) 06/06/2024   Nausea 02/09/2024   Stroke (cerebrum) (HCC) 06/24/2023   Allergic contact dermatitis due to drugs in contact with skin 04/30/2022   Abnormal  electrocardiogram (ECG) (EKG) 04/30/2022   Body mass index (BMI) 31.0-31.9, adult 11/21/2021   Other long term (current) drug therapy 11/21/2021   Sciatica 11/21/2021   Stage 3b chronic kidney disease (HCC) 05/30/2020   Type II diabetes mellitus with manifestations (HCC) 12/28/2019   Vertigo, benign paroxysmal, bilateral 08/15/2019   Left lumbar radiculitis 03/07/2017   Essential hypertension, benign 06/19/2014   RA (rheumatoid arthritis) (HCC) 06/19/2014   Constipation due to opioid therapy 06/19/2014   Encounter for general adult medical examination with abnormal findings 05/30/2012   INSOMNIA UNSPECIFIED 10/31/2007   Hyperlipidemia with target LDL less than 130 10/28/2007   Seasonal allergic rhinitis 10/28/2007   DJD (degenerative joint disease) of knee 10/28/2007   PCP:  Sherlynn Madden, MD Pharmacy:   [facility] - [place], [place] - [address] [place] [address] Phone: [phone] Fax: [phone]  [id number] [place], [place] - [address] AT [facility] OF [address] & [address] [place] [address] Phone: [phone] Fax: [phone]     Social Drivers of Health (SDOH) Social History: SDOH Screenings   Food Insecurity: Unknown (06/07/2024)  Housing: Unknown (06/07/2024)  Transportation Needs: No Transportation Needs (06/29/2023)  Utilities: Not At Risk (  06/24/2023)  Alcohol Screen: Low Risk  (12/10/2022)  Depression (PHQ2-9): Low Risk  (04/12/2024)  Financial Resource Strain: Low Risk  (12/10/2022)  Physical Activity: Insufficiently Active (12/10/2022)  Social Connections: Moderately Integrated (12/10/2022)  Stress: No Stress Concern Present (12/10/2022)  Tobacco Use: Low Risk  (06/06/2024)   SDOH Interventions:     Readmission Risk Interventions     No data to display

## 2024-06-08 NOTE — Progress Notes (Signed)
 TRH   ROUNDING   NOTE Abaigeal Moomaw Iorio FMW:[id number]  DOB: 03/11/1931  DOA: 06/06/2024  PCP: Sherlynn Madden, MD  06/08/2024,7:25 AM  LOS: 1 day    Code Status: Full code     from: Home current Dispo: Unclear    89 year old female history of rheumatoid arthritisOn Plaquenil , glaucoma Acute R MCA stroke 06/23/23 before present secondary to A-fib sent home on Holter DM TY 2 HTN HLD previous Neurontin  toxicity with tremors Prior shingles Right knee repair 2013, Left knee repair 2015  8/26 rheumatology eval progressive lower back abdominal pain tremor left leg-tonic-clonic seizure noted when patient was in ED Ativan  loaded Keppra  loaded muscular spasms and was obtunded CT head generalized cerebral atrophy with chronic white matter small vessel ischemic changes chronic right posterior parietal bilateral cerebellar infarct CT body no acute intrathoracic abdominal aorta abnormality no pulmonary embolism no acute other abnormality-simple renal cysts 8/27 MRI brain large old right parietal infarct encephalomalacia no change from 06/23/2023 Neurology consulted decided on EEG 8/27 EEG cortical dysfunction right posterior quadrant?  Underlying structural abnormality-no seizure or epileptiform discharge  Plan  Grand mal seizure in the setting of previous R MCA 2024 Ativan  Keppra  loaded currently on 500 twice daily oral Keppra  EEG nonsuggestive of other etiologies Defer to neurology  Right ankle fracture/calcaneofibular/posterior talofibular tear? Get CT of ankle with contrast and will discuss with Ortho may need cam boot versus operative management although possibly somewhat prohibitive  ?  UTI No culture data available-urine analysis positive nitrates trace leukocytes few bacteria-stop date Rocephin  updated 8/29 (3 days)-no severe signs of sepsis slightly elevated white count only which is nonspecific Continue saline 50 cc/H and probable saline lock a.m.  Previous CVA 2024 Continue Plavix  75,  Lipitor 40  Hypokalemia on admission Replaced aggressively currently normalized  Rheumatoid arthritis on Plaquenil  Continue Plaquenil  200 twice daily outpatient follow-up with rheumatologist  Glaucoma  Diabetes mellitus type 2 diet controlled at home-A1c 6.0 Sugar predominantly below 140-stop coverage  HTN-moderately controlled continue amlodipine  10 add back propranolol 50   RLS-Requip  0.25 at bedtime as needed B12 deficiency-1000 mcg daily replacement Glaucoma continue Cosopt  1 drop both eyes twice daily as well as latanoprost  to right eye HLD Orthopedic issues     Data Reviewed:   Sodium 141 potassium 3.8 chloride 107 CO2 23 BUN/creatinine 9/0.7 WBC 10.7 hemoglobin 11 platelet 176  DVT prophylaxis: Heparin   Status is: Inpatient Remains inpatient appropriate because:   Needs workup     Subjective: Pleasant coherent no distress no further seizure Severe pain back of foot    Objective + exam Vitals:   06/07/24 1959 06/08/24 0027 06/08/24 0352 06/08/24 0500  BP: (!) 144/57 127/81 (!) 155/75   Pulse: 72 76 79   Resp: 18 18 18    Temp: (!) 97.3 F (36.3 C) 99.5 F (37.5 C) 99.9 F (37.7 C)   TempSrc: Oral Oral Oral   SpO2: 100% 93% 99%   Weight:    70.5 kg  Height:       Filed Weights   06/06/24 2045 06/07/24 0245 06/08/24 0500  Weight: 67.6 kg 66.6 kg 70.5 kg     Examination: EOMI NCAT no focal deficit arcus Synolis looks younger than stated age S1-S2 no murmur Abdomen soft no rebound Tender over lateral malleolus and posterior lateral aspect of foot/ankle mortise with large amount of swelling compared to the left side I am unable to plantarflex given exquisite pain she can dorsiflex hide but with pain  Scheduled Meds:  amLODipine   10 mg Oral Daily   atorvastatin   40 mg Oral Daily   Chlorhexidine  Gluconate Cloth  6 each Topical Daily   clopidogrel   75 mg Oral Daily   cyanocobalamin   1,000 mcg Oral Daily   dorzolamide -timolol   1 drop  Both Eyes BID   heparin   5,000 Units Subcutaneous Q8H   hydroxychloroquine   200 mg Oral BID   insulin  aspart  0-9 Units Subcutaneous Q4H   levETIRAcetam   500 mg Intravenous BID   Or   levETIRAcetam   500 mg Oral BID   Netarsudil -Latanoprost   1 drop Right Eye QHS   pantoprazole   40 mg Oral BID   Continuous Infusions:  cefTRIAXone  (ROCEPHIN )  IV Stopped (06/07/24 1024)   lactated ringers  50 mL/hr at 06/07/24 1800    Time 60  Colen Grimes, MD  [facility]

## 2024-06-08 NOTE — Evaluation (Signed)
 Physical Therapy Evaluation Patient Details Name: Briana [name] MRN: [id number] DOB: 04-Mar-1931 Today's Date: 06/08/2024  History of Present Illness  Pt is a 89 y/o female presenting on 8/26 with progressive L sided abdominal pain and generalized weakness, witness seizure episode at [facility]. CT head with generalized cerebral atrophy, MRI brain negative for acute abnormalities. EEG with cortical dysfunction arising from R posterior quadrant, no seizure seen. MRI R ankle showed mildly attenuated distal portion of the anterior talofibular ligament with surrounding fluid signal, potentially from sprain or  remote injury; mild peroneus longus tenosynovitis below the cuboid; distal tibialis posterior tenosynovitis and mild distal  tendinopathy; flexor hallucis longus tenosynovitis at the knot of [place]; 6 mm nonfragmented osteochondral injury along the medial talar dome. PMH: bil knee arthritis, back pain, bunionectomy, HLD, HTN, insomnia, myositis, CVA   Clinical Impression  Pt presents with condition above and deficits mentioned below, see PT Problem List. PTA, she was mod I utilizing a RW for functional mobility, but needed supervision on stairs while 1 hand was on a handrail and 1 hand used a cane for support. She lives with her son in a 1-level house with 5-6 STE. Currently, the pt demonstrates deficits in cognition (may be baseline), vision (baseline tunnel vision), balance, gross strength, and activity tolerance. She also displays deficits in R ankle ROM and R lower extremity weight bearing tolerance due to extreme pain in her R ankle with any movement or touch. At this time, she is at high risk for falls, needing maxA to transfer sit to stand and modA to take pivotal steps with RW support. She only needed CGA for bed mobility. She will likely progress well once her R ankle pain improves though. Educated pt and daughter on her current level of assistance and need for assistance with all  standing mobility and assistance of +2 at least to bump her up the stairs backwards in her w/c if they decide to d/c home. Provided them with gait belt. Educated them on R.I.C.E. to manage edema and pain in R ankle. They verbalized understanding. Extensive time spent discussing d/c options and needs with pt and daughter with them deciding to d/c home with [facility] services and family support, confirming they can provide the level of care the pt is currently requiring. Thus, will recommend HHPT and HH aide at d/c. Will continue to follow acutely.      If plan is discharge home, recommend the following: A lot of help with walking and/or transfers;A little help with bathing/dressing/bathroom;Assistance with cooking/housework;Direct supervision/assist for medications management;Direct supervision/assist for financial management;Assist for transportation;Help with stairs or ramp for entrance   Can travel by private vehicle        Equipment Recommendations None recommended by PT  Recommendations for Other Services       Functional Status Assessment Patient has had a recent decline in their functional status and demonstrates the ability to make significant improvements in function in a reasonable and predictable amount of time.     Precautions / Restrictions Precautions Precautions: Fall;Other (comment) Precaution/Restrictions Comments: seizure; tunnel vision baseline Required Braces or Orthoses: Other Brace Other Brace: CAM boot to be ordered Restrictions Weight Bearing Restrictions Per Provider Order: Yes RLE Weight Bearing Per Provider Order: Weight bearing as tolerated Other Position/Activity Restrictions: Per Briana [name] 8/28 pt can be WBAT and can use air cast or boot if needed      Mobility  Bed Mobility Overal bed mobility: Needs Assistance Bed Mobility: Supine to Sit  Supine to sit: Contact guard, HOB elevated     General bed mobility comments: Extra time and cues needed to  direct pt to R EOB and to scoot to edge, CGA for safety, HOB elevated    Transfers Overall transfer level: Needs assistance Equipment used: Rolling walker (2 wheels) Transfers: Sit to/from Stand, Bed to chair/wheelchair/BSC Sit to Stand: Max assist   Step pivot transfers: Mod assist       General transfer comment: Pt was shaky and leaning posteriorly upon standing, having difficulty seeing where to reach to place her hands on the RW, needing maxA for balance and hand-over-hand guidance to place hands on RW grips. Once standing upright, pt needed cues to offload her R leg during stance phase by pushing down through her hands on the RW, modA needed for balance with step pivot to R from bed to recliner.    Ambulation/Gait Ambulation/Gait assistance: Mod assist Gait Distance (Feet): 2 Feet Assistive device: Rolling walker (2 wheels) Gait Pattern/deviations: Step-to pattern, Decreased weight shift to right, Decreased stance time - right, Decreased step length - right, Decreased step length - left, Decreased stride length, Trunk flexed, Leaning posteriorly, Antalgic Gait velocity: reduced Gait velocity interpretation: <1.31 ft/sec, indicative of household ambulator   General Gait Details: Pt takes slow, small, antalgic steps to step pivot to R from bed to recliner with RW support. Cues needed to offload her R leg as needed for pain management during R stance phase to allow L foot to advance and step by pushing down on RW. As pt took more steps, she demonstrated improved technique and thus L foot clearance and step length. ModA for balance due to posterior lean throughout.  Stairs            Wheelchair Mobility     Tilt Bed    Modified Rankin (Stroke Patients Only) Modified Rankin (Stroke Patients Only) Pre-Morbid Rankin Score: Moderate disability Modified Rankin: Moderately severe disability     Balance Overall balance assessment: Needs assistance Sitting-balance support: No  upper extremity supported, Feet supported Sitting balance-Leahy Scale: Fair Sitting balance - Comments: static sitting EOB with supervision for safety Postural control: Posterior lean Standing balance support: Bilateral upper extremity supported, During functional activity, Reliant on assistive device for balance Standing balance-Leahy Scale: Poor Standing balance comment: Posterior lean noted, modA and RW support to stand                             Pertinent Vitals/Pain Pain Assessment Pain Assessment: Faces Faces Pain Scale: Hurts whole lot Pain Location: R ankle Pain Descriptors / Indicators: Discomfort, Grimacing, Guarding, Sore Pain Intervention(s): Limited activity within patient's tolerance, Monitored during session, Repositioned, Ice applied    Home Living Family/patient expects to be discharged to:: Private residence Living Arrangements: Children (son) Available Help at Discharge: Family;Available 24 hours/day Type of Home: House Home Access: Stairs to enter Entrance Stairs-Rails: Doctor, general practice of Steps: 5-6   Home Layout: One level Home Equipment: Shower seat;Hand held shower head;Grab bars - tub/shower;BSC/3in1;Rolling Environmental consultant (2 wheels);Rollator (4 wheels);Other (comment);Wheelchair - manual (hurrycane)      Prior Function Prior Level of Function : Needs assist             Mobility Comments: Uses x1 handrail and cane on stairs with +2 supervision for safety. Mod I using RW to ambulate ADLs Comments: Children assist with meds, transportation, cleaning, and cooking. Daughter assists set-up of showers and washes her  back but otherwise pt washes herself with supervision. Dresses herself     Extremity/Trunk Assessment   Upper Extremity Assessment Upper Extremity Assessment: Defer to OT evaluation    Lower Extremity Assessment Lower Extremity Assessment: RLE deficits/detail;Generalized weakness (denied numbness/tingling bil) RLE  Deficits / Details: R ankle lateral edema noted along with pain with gentle palpation and any ankle ROM    Cervical / Trunk Assessment Cervical / Trunk Assessment: Kyphotic  Communication   Communication Communication: No apparent difficulties    Cognition Arousal: Alert Behavior During Therapy: WFL for tasks assessed/performed   PT - Cognitive impairments: Memory, Attention, Initiation, Sequencing, Problem solving, Safety/Judgement                       PT - Cognition Comments: Pt repeats herself and unsure if pt truly forgets why her ankle hurts or is asking why it hurts, but repeats I don't know why it hurts so much even though repeatedly educated she has an ankle sprain. Pt easily distracted by pain and needs redirecting. Following commands: Impaired Following commands impaired: Follows one step commands inconsistently, Follows one step commands with increased time     Cueing Cueing Techniques: Verbal cues, Tactile cues     General Comments General comments (skin integrity, edema, etc.): Educated pt and daughter on her current level of assistance and need for assistance with all standing mobility and assistance of +2 at least to bump her up the stairs backwards in her w/c if they decide to d/c home. Provided them with gait belt. Educated them on R.I.C.E. to manage edema and pain in R ankle. They verbalized understanding. Extensive time spent discussing d/c options and needs with pt and daughter deciding to d/c home with [facility] services and family support.    Exercises     Assessment/Plan    PT Assessment Patient needs continued PT services  PT Problem List Decreased strength;Decreased range of motion;Decreased activity tolerance;Decreased balance;Decreased mobility;Decreased cognition;Pain       PT Treatment Interventions DME instruction;Gait training;Stair training;Functional mobility training;Therapeutic activities;Therapeutic exercise;Balance training;Neuromuscular  re-education;Patient/family education    PT Goals (Current goals can be found in the Care Plan section)  Acute Rehab PT Goals Patient Stated Goal: to improve her pain and go home PT Goal Formulation: With patient/family Time For Goal Achievement: 06/22/24 Potential to Achieve Goals: Good    Frequency Min 2X/week     Co-evaluation               AM-PAC PT 6 Clicks Mobility  Outcome Measure Help needed turning from your back to your side while in a flat bed without using bedrails?: A Little Help needed moving from lying on your back to sitting on the side of a flat bed without using bedrails?: A Little Help needed moving to and from a bed to a chair (including a wheelchair)?: A Lot Help needed standing up from a chair using your arms (e.g., wheelchair or bedside chair)?: A Lot Help needed to walk in hospital room?: Total Help needed climbing 3-5 steps with a railing? : Total 6 Click Score: 12    End of Session Equipment Utilized During Treatment: Gait belt Activity Tolerance: Patient limited by pain Patient left: in chair;with call bell/phone within reach;with chair alarm set;with family/visitor present Nurse Communication: Mobility status;Other (comment) (ice on ankle once back in bed) PT Visit Diagnosis: Unsteadiness on feet (R26.81);Other abnormalities of gait and mobility (R26.89);Muscle weakness (generalized) (M62.81);Difficulty in walking, not elsewhere classified (R26.2);Pain Pain -  Right/Left: Right Pain - part of body: Ankle and joints of foot    Time: 8553-8451 PT Time Calculation (min) (ACUTE ONLY): 62 min   Charges:   PT Evaluation $PT Eval Moderate Complexity: 1 Mod PT Treatments $Therapeutic Activity: 38-52 mins PT General Charges $$ ACUTE PT VISIT: 1 Visit         Theo Ferretti, PT, DPT Acute Rehabilitation Services  Office: [phone]   Theo CHRISTELLA Ferretti 06/08/2024, 4:06 PM

## 2024-06-08 NOTE — Progress Notes (Signed)
 Orthopedic Tech Progress Note Patient Details:  NYIESHA BEEVER 1931-04-29 [id number]  Ortho Devices Type of Ortho Device: CAM walker Ortho Device/Splint Location: for the RLE, boot is at bedside Ortho Device/Splint Interventions: Ordered   Post Interventions Instructions Provided: Care of device, Adjustment of device (Pt's daughter at bedside is also aware of how to apply the device)  Tinnie Ronal Brasil 06/08/2024, 5:17 PM

## 2024-06-09 ENCOUNTER — Telehealth: Payer: Self-pay

## 2024-06-09 DIAGNOSIS — G40901 Epilepsy, unspecified, not intractable, with status epilepticus: Secondary | ICD-10-CM | POA: Diagnosis not present

## 2024-06-09 DIAGNOSIS — G40409 Other generalized epilepsy and epileptic syndromes, not intractable, without status epilepticus: Secondary | ICD-10-CM | POA: Diagnosis not present

## 2024-06-09 DIAGNOSIS — Z8673 Personal history of transient ischemic attack (TIA), and cerebral infarction without residual deficits: Secondary | ICD-10-CM | POA: Diagnosis not present

## 2024-06-09 LAB — BASIC METABOLIC PANEL WITH GFR
Anion gap: 9 (ref 5–15)
BUN: 8 mg/dL (ref 8–23)
CO2: 27 mmol/L (ref 22–32)
Calcium: 8.9 mg/dL (ref 8.9–10.3)
Chloride: 102 mmol/L (ref 98–111)
Creatinine, Ser: 0.88 mg/dL (ref 0.44–1.00)
GFR, Estimated: >60 mL/min (ref >60)
Glucose, Bld: 113 mg/dL — ABNORMAL HIGH (ref 70–99)
Potassium: 3.6 mmol/L (ref 3.5–5.1)
Sodium: 138 mmol/L (ref 135–145)

## 2024-06-09 LAB — CBC
HCT: 33.9 % — ABNORMAL LOW (ref 36.0–46.0)
Hemoglobin: 11.5 g/dL — ABNORMAL LOW (ref 12.0–15.0)
MCH: 30.1 pg (ref 26.0–34.0)
MCHC: 33.9 g/dL (ref 30.0–36.0)
MCV: 88.7 fL (ref 80.0–100.0)
Platelets: 186 K/uL (ref 150–400)
RBC: 3.82 MIL/uL — ABNORMAL LOW (ref 3.87–5.11)
RDW: 16.5 % — ABNORMAL HIGH (ref 11.5–15.5)
WBC: 9.7 K/uL (ref 4.0–10.5)
nRBC: 0 % (ref 0.0–0.2)

## 2024-06-09 LAB — GLUCOSE, CAPILLARY: Glucose-Capillary: 103 mg/dL — ABNORMAL HIGH (ref 70–99)

## 2024-06-09 MED ORDER — LEVETIRACETAM 500 MG PO TABS: 500.0000 mg | ORAL_TABLET | Freq: Two times a day (BID) | ORAL | 11 refills | Status: AC

## 2024-06-09 MED ORDER — ACETAMINOPHEN-CODEINE 300-30 MG PO TABS: 1.0000 | ORAL_TABLET | ORAL | 0 refills | Status: DC | PRN

## 2024-06-09 MED ORDER — ROPINIROLE HCL 0.25 MG PO TABS: 0.2500 mg | ORAL_TABLET | Freq: Every evening | ORAL | 11 refills | Status: AC | PRN

## 2024-06-09 NOTE — Plan of Care (Signed)

## 2024-06-09 NOTE — Telephone Encounter (Signed)
 Copied from CRM [id number]. Topic: Clinical - Medical Advice >> Jun 09, 2024  2:31 PM Zane F wrote: Reason for CRM:   Caller: [place]  Calling From: [facility]  Calling to inform the office of a delay in service due to the long weekend. Service for the patient will be PT and OT starting 06/15/24.  Callback Number: [phone]

## 2024-06-09 NOTE — Evaluation (Signed)
 Occupational Therapy Evaluation Patient Details Name: Briana [name] MRN: [id number] DOB: Jul 06, 1931 Today's Date: 06/09/2024   History of Present Illness   Pt is a 89 y/o female presenting on 8/26 with progressive L sided abdominal pain and generalized weakness, witness seizure episode at [facility]. CT head with generalized cerebral atrophy, MRI brain negative for acute abnormalities. EEG with cortical dysfunction arising from R posterior quadrant, no seizure seen. MRI R ankle showed mildly attenuated distal portion of the anterior talofibular ligament with surrounding fluid signal, potentially from sprain or  remote injury; mild peroneus longus tenosynovitis below the cuboid; distal tibialis posterior tenosynovitis and mild distal  tendinopathy; flexor hallucis longus tenosynovitis at the knot of Henry; 6 mm nonfragmented osteochondral injury along the medial talar dome. PMH: bil knee arthritis, back pain, bunionectomy, HLD, HTN, insomnia, myositis, CVA     Clinical Impressions Pt admitted for above, PTA pt was living with family and the children would assist with iADLs, bathing with supervision but able to dress herself and ambulating mod I using RW at home. Pt currently limited by R ankle pain, needing modA +2 to pivot to recliner and maintain standing balance, she is unfamiliar with use of R cam boot and needs total A for LBD, pt needing setup A to total A for her ADLs, +2 for toileting. OT to continue following pt acutely to progress pt as able. Patient will benefit from continued inpatient follow up therapy, <3 hours/day 2.      If plan is discharge home, recommend the following:   A lot of help with walking and/or transfers;A lot of help with bathing/dressing/bathroom;Assistance with cooking/housework;Supervision due to cognitive status     Functional Status Assessment   Patient has had a recent decline in their functional status and demonstrates the ability to make  significant improvements in function in a reasonable and predictable amount of time.     Equipment Recommendations   None recommended by OT (defer)     Recommendations for Other Services         Precautions/Restrictions   Precautions Precautions: Fall;Other (comment) Recall of Precautions/Restrictions: Impaired Precaution/Restrictions Comments: seizure; tunnel vision baseline Required Braces or Orthoses: Other Brace Other Brace: CAM boot Restrictions Weight Bearing Restrictions Per Provider Order: Yes RLE Weight Bearing Per Provider Order: Weight bearing as tolerated Other Position/Activity Restrictions: Per Briana [name] 8/28 pt can be WBAT and can use air cast or boot if needed     Mobility Bed Mobility Overal bed mobility: Needs Assistance Bed Mobility: Supine to Sit     Supine to sit: Mod assist     General bed mobility comments: assistance for RLE support and to scoot forward towards edge of bed. cues required for technique    Transfers Overall transfer level: Needs assistance Equipment used: Rolling walker (2 wheels) Transfers: Sit to/from Stand, Bed to chair/wheelchair/BSC Sit to Stand: Min assist, +2 physical assistance     Step pivot transfers: Mod assist, +2 physical assistance     General transfer comment: CAM boot donned with maximal assistance prior to mobilizing. patient needs Min A +2 for power up to stand. cues for hand placement with difficulty with carry over. increased assistance required for step pivot with maximal cues for technique and not to sit prematurely as standing tolerance is limited due to pain      Balance Overall balance assessment: Needs assistance Sitting-balance support: No upper extremity supported, Feet supported Sitting balance-Leahy Scale: Fair Sitting balance - Comments: static sitting EOB with supervision  for safety Postural control: Posterior lean Standing balance support: Bilateral upper extremity supported,  Reliant on assistive device for balance Standing balance-Leahy Scale: Poor Standing balance comment: posterior lean initially with cues for anterior weight shifting. heavy reliance on external support                           ADL either performed or assessed with clinical judgement   ADL Overall ADL's : Needs assistance/impaired Eating/Feeding: Sitting;Set up   Grooming: Sitting;Wash/dry face;Set up Grooming Details (indicate cue type and reason): supported sitting in recliner Upper Body Bathing: Sitting;Set up   Lower Body Bathing: Maximal assistance;Sitting/lateral leans   Upper Body Dressing : Sitting;Minimal assistance   Lower Body Dressing: Total assistance;Sit to/from stand;Sitting/lateral leans Lower Body Dressing Details (indicate cue type and reason): total A for R cam boot as well. Toilet Transfer: +2 for physical assistance;+2 for safety/equipment;Stand-pivot;Moderate assistance Toilet Transfer Details (indicate cue type and reason): simulated with recliner. Toileting- Clothing Manipulation and Hygiene: +2 for physical assistance;Maximal assistance;Sit to/from stand       Functional mobility during ADLs: +2 for safety/equipment;+2 for physical assistance;Moderate assistance       Vision   Vision Assessment?: No apparent visual deficits     Perception         Praxis         Pertinent Vitals/Pain Pain Assessment Pain Assessment: Faces Faces Pain Scale: Hurts whole lot Pain Location: R ankle Pain Descriptors / Indicators: Discomfort, Grimacing, Guarding, Sore Pain Intervention(s): Monitored during session, Limited activity within patient's tolerance, Repositioned, Ice applied     Extremity/Trunk Assessment Upper Extremity Assessment Upper Extremity Assessment: Generalized weakness   Lower Extremity Assessment Lower Extremity Assessment: Generalized weakness;RLE deficits/detail (denied numbness/tingling bil) RLE Deficits / Details: R ankle  lateral edema noted along with pain with gentle palpation and any ankle ROM   Cervical / Trunk Assessment Cervical / Trunk Assessment: Kyphotic   Communication Communication Communication: No apparent difficulties   Cognition Arousal: Alert Behavior During Therapy: WFL for tasks assessed/performed Cognition: No family/caregiver present to determine baseline             OT - Cognition Comments: Pt a bit tangenital, follows simple commands and overall is plesant.                 Following commands: Impaired Following commands impaired: Follows one step commands inconsistently, Follows one step commands with increased time     Cueing  General Comments   Cueing Techniques: Verbal cues;Tactile cues  BLEs elevated on pillows.   Exercises     Shoulder Instructions      Home Living Family/patient expects to be discharged to:: Private residence Living Arrangements: Children (son) Available Help at Discharge: Family;Available 24 hours/day Type of Home: House Home Access: Stairs to enter [employer] of Steps: 5-6 Entrance Stairs-Rails: Right;Left Home Layout: One level     Bathroom Shower/Tub: Chief Strategy Officer: Handicapped height     Home Equipment: Shower seat;Hand held shower head;Grab bars - tub/shower;BSC/3in1;Rolling Environmental consultant (2 wheels);Rollator (4 wheels);Other (comment);Wheelchair - manual          Prior Functioning/Environment Prior Level of Function : Needs assist             Mobility Comments: Uses x1 handrail and cane on stairs with +2 supervision for safety. Mod I using RW to ambulate ADLs Comments: Children assist with meds, transportation, cleaning, and cooking. Daughter assists set-up of showers and  washes her back but otherwise pt washes herself with supervision. Dresses herself    OT Problem List: Impaired balance (sitting and/or standing);Decreased cognition;Pain;Decreased strength;Decreased knowledge of  precautions   OT Treatment/Interventions: Self-care/ADL training;Patient/family education;Therapeutic exercise;Balance training;Therapeutic activities;DME and/or AE instruction      OT Goals(Current goals can be found in the care plan section)   Acute Rehab OT Goals Patient Stated Goal: none stated OT Goal Formulation: Patient unable to participate in goal setting Time For Goal Achievement: 06/24/24 Potential to Achieve Goals: Good ADL Goals Pt Will Perform Lower Body Bathing: sitting/lateral leans;with min assist Pt Will Perform Lower Body Dressing: sit to/from stand;with mod assist Pt Will Transfer to Toilet: bedside commode;with min assist;stand pivot transfer Pt Will Perform Toileting - Clothing Manipulation and hygiene: sit to/from stand;sitting/lateral leans;with min assist Additional ADL Goal #2: pt family will demonstrate independent management of R CAM boot.   OT Frequency:  Min 2X/week    Co-evaluation   Reason for Co-Treatment: To address functional/ADL transfers PT goals addressed during session: Mobility/safety with mobility OT goals addressed during session: ADL's and self-care;Proper use of Adaptive equipment and DME      AM-PAC OT 6 Clicks Daily Activity     Outcome Measure Help from another person eating meals?: A Little Help from another person taking care of personal grooming?: A Little Help from another person toileting, which includes using toliet, bedpan, or urinal?: A Lot Help from another person bathing (including washing, rinsing, drying)?: A Lot Help from another person to put on and taking off regular upper body clothing?: A Little Help from another person to put on and taking off regular lower body clothing?: A Lot 6 Click Score: 15   End of Session Equipment Utilized During Treatment: Gait belt;Rolling walker (2 wheels) Nurse Communication: Mobility status  Activity Tolerance: Patient tolerated treatment well Patient left: in chair;with call  bell/phone within reach;with chair alarm set  OT Visit Diagnosis: Other abnormalities of gait and mobility (R26.89);Unsteadiness on feet (R26.81);Pain;Muscle weakness (generalized) (M62.81) Pain - Right/Left: Right Pain - part of body: Ankle and joints of foot                Time: 9046-8981 OT Time Calculation (min): 25 min Charges:  OT General Charges $OT Visit: 1 Visit OT Evaluation $OT Eval Moderate Complexity: 1 Mod  06/09/2024  AB, OTR/L  Acute Rehabilitation Services  Office: [phone]   Briana [name] 06/09/2024, 1:19 PM

## 2024-06-09 NOTE — Progress Notes (Signed)
 Physical Therapy Treatment Patient Details Name: Briana [name] MRN: [id number] DOB: 03-01-1931 Today's Date: 06/09/2024   History of Present Illness Pt is a 89 y/o female presenting on 8/26 with progressive L sided abdominal pain and generalized weakness, witness seizure episode at [facility]. CT head with generalized cerebral atrophy, MRI brain negative for acute abnormalities. EEG with cortical dysfunction arising from R posterior quadrant, no seizure seen. MRI R ankle showed mildly attenuated distal portion of the anterior talofibular ligament with surrounding fluid signal, potentially from sprain or  remote injury; mild peroneus longus tenosynovitis below the cuboid; distal tibialis posterior tenosynovitis and mild distal  tendinopathy; flexor hallucis longus tenosynovitis at the knot of Henry; 6 mm nonfragmented osteochondral injury along the medial talar dome. PMH: bil knee arthritis, back pain, bunionectomy, HLD, HTN, insomnia, myositis, CVA    PT Comments  Patient is agreeable to PT session and eager to try getting out of bed with CAM boot in place. Patient required +2 person assistance for step pivot transfer to chair with maximal cues for safety and technique with most all mobility efforts. Pre-gait activity performed with emphasis on increased weight acceptance on RLE with standing in preparation for ambulation. Unable to progress walking this date with limited standing tolerance related to pain and fatigue with activity. Consider rehabilitation < 3 hours/day after this hospital stay as patient continues to require assistance for all mobility. PT will continue to follow.    If plan is discharge home, recommend the following: Two people to help with walking and/or transfers;A lot of help with bathing/dressing/bathroom;Assistance with cooking/housework;Help with stairs or ramp for entrance;Supervision due to cognitive status;Assist for transportation   Can travel by private vehicle      No  Equipment Recommendations  None recommended by PT    Recommendations for Other Services       Precautions / Restrictions Precautions Precautions: Fall;Other (comment) Recall of Precautions/Restrictions: Impaired Precaution/Restrictions Comments: seizure; tunnel vision baseline Required Braces or Orthoses: Other Brace Other Brace: CAM boot Restrictions Weight Bearing Restrictions Per Provider Order: Yes RLE Weight Bearing Per Provider Order: Weight bearing as tolerated Other Position/Activity Restrictions: Per Briana [name] 8/28 pt can be WBAT and can use air cast or boot if needed     Mobility  Bed Mobility Overal bed mobility: Needs Assistance Bed Mobility: Supine to Sit     Supine to sit: Mod assist     General bed mobility comments: assistance for RLE support and to scoot forward towards edge of bed. cues required for technique    Transfers Overall transfer level: Needs assistance Equipment used: Rolling walker (2 wheels) Transfers: Sit to/from Stand, Bed to chair/wheelchair/BSC Sit to Stand: Min assist, +2 physical assistance   Step pivot transfers: Mod assist, +2 physical assistance       General transfer comment: CAM boot donned with maximal assistance prior to mobilizing. patient needs Min A +2 for power up to stand. cues for hand placement with difficulty with carry over. increased assistance required for step pivot with maximal cues for technique and not to sit prematurely as standing tolerance is limited due to pain    Ambulation/Gait             Pre-gait activities: weight shifting faciliation provided with emphasis on increasing weight acceptance on RLE with CAM boot in place General Gait Details: unable to advance gait this session due to pain with standing   Stairs             Wheelchair  Mobility     Tilt Bed    Modified Rankin (Stroke Patients Only)       Balance Overall balance assessment: Needs  assistance Sitting-balance support: No upper extremity supported, Feet supported Sitting balance-Leahy Scale: Fair   Postural control: Posterior lean Standing balance support: Bilateral upper extremity supported, Reliant on assistive device for balance Standing balance-Leahy Scale: Poor Standing balance comment: posterior lean initially with cues for anterior weight shifting. heavy reliance on external support                            Communication Communication Communication: No apparent difficulties  Cognition Arousal: Alert Behavior During Therapy: WFL for tasks assessed/performed   PT - Cognitive impairments: Memory, Attention, Initiation, Sequencing, Problem solving, Safety/Judgement                       PT - Cognition Comments: internally distracted at times. needs cues for attention to task and follow through. Following commands: Impaired Following commands impaired: Follows one step commands inconsistently, Follows one step commands with increased time    Cueing Cueing Techniques: Verbal cues, Tactile cues  Exercises      General Comments General comments (skin integrity, edema, etc.): alerted by social worker that family reporting concern over level of assistance required to safely bring patient home. discharge recommendation upgraded to SNF at this time      Pertinent Vitals/Pain Pain Assessment Pain Assessment: Faces Faces Pain Scale: Hurts whole lot Pain Location: R ankle Pain Descriptors / Indicators: Discomfort, Grimacing, Guarding, Sore Pain Intervention(s): Limited activity within patient's tolerance, Monitored during session, Repositioned, Ice applied    Home Living                          Prior Function            PT Goals (current goals can now be found in the care plan section) Acute Rehab PT Goals Patient Stated Goal: to get out of bed PT Goal Formulation: With patient/family Time For Goal Achievement:  06/22/24 Potential to Achieve Goals: Good Progress towards PT goals: Progressing toward goals    Frequency    Min 2X/week      PT Plan      Co-evaluation PT/OT/SLP Co-Evaluation/Treatment: Yes Reason for Co-Treatment: To address functional/ADL transfers PT goals addressed during session: Mobility/safety with mobility        AM-PAC PT 6 Clicks Mobility   Outcome Measure  Help needed turning from your back to your side while in a flat bed without using bedrails?: A Little Help needed moving from lying on your back to sitting on the side of a flat bed without using bedrails?: A Lot Help needed moving to and from a bed to a chair (including a wheelchair)?: A Lot Help needed standing up from a chair using your arms (e.g., wheelchair or bedside chair)?: Total Help needed to walk in hospital room?: Total Help needed climbing 3-5 steps with a railing? : Total 6 Click Score: 10    End of Session Equipment Utilized During Treatment: Gait belt (CAM boot) Activity Tolerance: Patient tolerated treatment well;Patient limited by pain Patient left: in chair;with call bell/phone within reach;with chair alarm set Nurse Communication: Mobility status PT Visit Diagnosis: Unsteadiness on feet (R26.81);Other abnormalities of gait and mobility (R26.89);Muscle weakness (generalized) (M62.81);Difficulty in walking, not elsewhere classified (R26.2);Pain Pain - Right/Left: Right Pain - part of body:  Ankle and joints of foot     Time: 9045-8978 PT Time Calculation (min) (ACUTE ONLY): 27 min  Charges:    $Therapeutic Activity: 8-22 mins PT General Charges $$ ACUTE PT VISIT: 1 Visit                     Randine Essex, PT, MPT    Randine LULLA Essex 06/09/2024, 11:42 AM

## 2024-06-09 NOTE — TOC Progression Note (Addendum)
 Transition of Care [facility]) - Progression Note    Patient Details  Name: Briana [name] MRN: [id number] Date of Birth: 1930/11/09  [facility] CM/SW Contact  Almarie CHRISTELLA Goodie, [place] Phone Number: 06/09/2024, 1:24 PM  Clinical Narrative:   CSW updated by medical team that patient's family now interested in SNF placement due to patient's increased weakness. CSW completed referral and faxed out, will update with bed offers.    UPDATE: CSW met with patient, daughter, and son at bedside. They are interested in [facility] for SNF. [facility] has offered a bed. CSW contacted CMA to initiate insurance authorization. CSW to follow.  UPDATE: Patient is approved, 8/29 - 9/2 Mayme Barrows PI#[id number]. [facility] can admit tomorrow, MD notified. CSW to follow.   Expected Discharge Plan: Skilled Nursing Facility Barriers to Discharge: Insurance Authorization               Expected Discharge Plan and Services       Living arrangements for the past 2 months: Single Family Home Expected Discharge Date: 06/09/24                                     Social Drivers of Health (SDOH) Interventions SDOH Screenings   Food Insecurity: Unknown (06/07/2024)  Housing: Unknown (06/07/2024)  Transportation Needs: No Transportation Needs (06/29/2023)  Utilities: Not At Risk (06/24/2023)  Alcohol Screen: Low Risk  (12/10/2022)  Depression (PHQ2-9): Low Risk  (04/12/2024)  Financial Resource Strain: Low Risk  (12/10/2022)  Physical Activity: Insufficiently Active (12/10/2022)  Social Connections: Moderately Integrated (12/10/2022)  Stress: No Stress Concern Present (12/10/2022)  Tobacco Use: Low Risk  (06/06/2024)    Readmission Risk Interventions     No data to display

## 2024-06-09 NOTE — TOC Transition Note (Signed)
 Transition of Care [facility]) - Discharge Note   Patient Details  Name: Briana [name] MRN: [id number] Date of Birth: Mar 13, 1931  [facility]) CM/SW Contact:  Corean JAYSON Canary, RN Phone Number: 06/09/2024, 10:25 AM   Clinical Narrative:    Patient is transitioning home with home health today. Has all DME, Kelly from [facility] notified of discharge    Final next level of care: Home w Home Health Services Barriers to Discharge: No Barriers Identified   Patient Goals and CMS Choice            Discharge Placement                       Discharge Plan and Services Additional resources added to the After Visit Summary for                                       Social Drivers of Health (SDOH) Interventions SDOH Screenings   Food Insecurity: Unknown (06/07/2024)  Housing: Unknown (06/07/2024)  Transportation Needs: No Transportation Needs (06/29/2023)  Utilities: Not At Risk (06/24/2023)  Alcohol Screen: Low Risk  (12/10/2022)  Depression (PHQ2-9): Low Risk  (04/12/2024)  Financial Resource Strain: Low Risk  (12/10/2022)  Physical Activity: Insufficiently Active (12/10/2022)  Social Connections: Moderately Integrated (12/10/2022)  Stress: No Stress Concern Present (12/10/2022)  Tobacco Use: Low Risk  (06/06/2024)     Readmission Risk Interventions     No data to display

## 2024-06-09 NOTE — Telephone Encounter (Signed)
 Message sent to PCP Venita Sheffield, MD as Briana [name].

## 2024-06-09 NOTE — Plan of Care (Signed)

## 2024-06-09 NOTE — Progress Notes (Signed)
 Neurology progress note  S: alertness, still becomes confused in conversation  O:  Vitals:   06/09/24 0000 06/09/24 0344  BP: (!) 133/57 (!) 143/63  Pulse: (!) 59 66  Resp:    Temp: 98.2 F (36.8 C) 98.1 F (36.7 C)  SpO2: 98% 97%   General: Laying comfortably in bed; in no acute distress.  HENT: Normal oropharynx and mucosa. Normal external appearance of ears and nose.  Neck: Supple, no pain or tenderness  CV: No JVD. No peripheral edema.  Pulmonary: Symmetric Chest rise. Normal respiratory effort.  Abdomen: Soft to touch, non-tender.  Ext: No cyanosis, edema, or deformity  Skin: No rash. Normal palpation of skin.   Musculoskeletal: Normal digits and nails by inspection. No clubbing.   Mental status/Cognition: alert, oriented to self and place Speech/language: dysarthric speech. Non fluent, comprehension intact Cranial nerves:   CN II Pupils equal and reactive to light,L hemianopsia   CN III,IV,VI R gaze preference, does cross midline barely.   CN V normal sensation in V1, V2, and V3 segments bilaterally   CN VII no asymmetry, no nasolabial fold flattening   CN VIII normal hearing to speech   CN IX & X normal palatal elevation, no uvular deviation   CN XI 5/5 head turn and 5/5 shoulder shrug bilaterally   CN XII midline tongue protrusion    Sensory/Motor:  Muscle bulk: poor, tone normal Moves RUE and RLE more than LUE and LLE. Localizes in all extremities to proximal pinch.   Coordination/Complex Motor:  No obvious ataxia noted.   CT Head without contrast(Personally reviewed): CTH was negative for a large hypodensity concerning for a large territory infarct or hyperdensity concerning for an ICH   MRI Brain: Pending  A/P: Briana [name] is a 89 y.o. female with hx of HTN, HLD, R parietal lobe cortical stroke who [facility] with left sided pain and generalized weakness. She developed L leg jerking that spread to her  back and then in the ED, she had a GTC seizure in the ED and was obtunded and unresponsive to sternal rub per notes.   She is improving and now able to follow commands and answer questions. She has L hemianopsia and R gaze preference and move RUE and RLE more than LUE and LLE.   No clinical seizure activity noted.   I suspect that the likely etiology of seizure is the prior R parietal lobe stroke.  No seizure on EEG. MRI brain is pending.  - Continue keppra  500mg  bid - MRI brain wo contrast - Will f/u MRI results, otherwise will be available prn for questions going forward  Elida Ross, MD [facility] [phone]  If 7pm- 7am, please page neurology on call as listed in AMION.

## 2024-06-09 NOTE — [place] FL2 (Signed)
 [place]  MEDICAID FL2 LEVEL OF CARE FORM     IDENTIFICATION  Patient Name: Briana [name] Birthdate: 1931/05/03 Sex: female Admission Date (Current Location): 06/06/2024  [facility] and [place] Number:  Producer, television/film/video and Address:  [facility] [place], [address] [place], [place] [address]      Provider Number: [id number]  Attending Physician Name and Address:  Royal Sill, MD  Relative Name and Phone Number:       Current Level of Care: Hospital Recommended Level of Care: Skilled Nursing Facility Prior Approval Number:    Date Approved/Denied:   PASRR Number: [id number]  Discharge Plan: SNF    Current Diagnoses: Patient Active Problem List   Diagnosis Date Noted   Status epilepticus (HCC) 06/06/2024   Nausea 02/09/2024   Stroke (cerebrum) (HCC) 06/24/2023   Allergic contact dermatitis due to drugs in contact with skin 04/30/2022   Abnormal electrocardiogram (ECG) (EKG) 04/30/2022   Body mass index (BMI) 31.0-31.9, adult 11/21/2021   Other long term (current) drug therapy 11/21/2021   Sciatica 11/21/2021   Stage 3b chronic kidney disease (HCC) 05/30/2020   Type II diabetes mellitus with manifestations (HCC) 12/28/2019   Vertigo, benign paroxysmal, bilateral 08/15/2019   Left lumbar radiculitis 03/07/2017   Essential hypertension, benign 06/19/2014   RA (rheumatoid arthritis) (HCC) 06/19/2014   Constipation due to opioid therapy 06/19/2014   Encounter for general adult medical examination with abnormal findings 05/30/2012   INSOMNIA UNSPECIFIED 10/31/2007   Hyperlipidemia with target LDL less than 130 10/28/2007   Seasonal allergic rhinitis 10/28/2007   DJD (degenerative joint disease) of knee 10/28/2007    Orientation RESPIRATION BLADDER Height & Weight     Self, Time, Place, Situation  Normal Incontinent Weight: 155 lb 6.8 oz (70.5 kg) Height:  5' 4 (162.6 cm)  BEHAVIORAL SYMPTOMS/MOOD NEUROLOGICAL BOWEL NUTRITION  STATUS    Convulsions/Seizures Continent Diet (regular)  AMBULATORY STATUS COMMUNICATION OF NEEDS Skin   Extensive Assist Verbally Normal                       Personal Care Assistance Level of Assistance  Bathing, Feeding, Dressing Bathing Assistance: Maximum assistance Feeding assistance: Limited assistance Dressing Assistance: Maximum assistance     Functional Limitations Info  Sight Sight Info: Impaired (impaired left eye)        SPECIAL CARE FACTORS FREQUENCY  PT (By licensed PT), OT (By licensed OT)     PT Frequency: 5x/wk OT Frequency: 5x/wk            Contractures Contractures Info: Not present    Additional Factors Info  Code Status, Allergies Code Status Info: Full Allergies Info: Motrin  (Ibuprofen ), Percocet (Oxycodone -acetaminophen ), Zocor  (Simvastatin ), 2,4-d Dimethylamine, Calan (Verapamil), Cardizem (Diltiazem), Hydrochlorothiazide, Valtrex  (Valacyclovir ), Zestril (Lisinopril)           Current Medications (06/09/2024):  This is the current hospital active medication list Current Facility-Administered Medications  Medication Dose Route Frequency Provider Last Rate Last Admin   acetaminophen  (TYLENOL ) tablet 650 mg  650 mg Oral Q8H PRN Desai, Rahul P, PA-C   650 mg at 06/08/24 0839   acetaminophen -codeine  (TYLENOL  #3) 300-30 MG per tablet 1-2 tablet  1-2 tablet Oral Q4H PRN Samtani, Jai-Gurmukh, MD   2 tablet at 06/09/24 0836   amLODipine  (NORVASC ) tablet 10 mg  10 mg Oral Daily Desai, Rahul P, PA-C   10 mg at 06/09/24 0837   atenolol  (TENORMIN ) tablet 50 mg  50 mg Oral Daily  Samtani, Jai-Gurmukh, MD   50 mg at 06/09/24 9162   atorvastatin  (LIPITOR) tablet 40 mg  40 mg Oral Daily Desai, Rahul P, PA-C   40 mg at 06/09/24 9162   Chlorhexidine  Gluconate Cloth 2 % PADS 6 each  6 each Topical Daily Maree Harder, MD   6 each at 06/09/24 [id number]   clopidogrel  (PLAVIX ) tablet 75 mg  75 mg Oral Daily Desai, Rahul P, PA-C   75 mg at 06/09/24 9162    cyanocobalamin  (VITAMIN B12) tablet 1,000 mcg  1,000 mcg Oral Daily Meade, Rahul P, PA-C   1,000 mcg at 06/09/24 9162   docusate sodium  (COLACE) capsule 100 mg  100 mg Oral BID PRN Meade, Rahul P, PA-C       dorzolamide -timolol  (COSOPT ) 2-0.5 % ophthalmic solution 1 drop  1 drop Both Eyes BID Meade, Rahul P, PA-C   1 drop at 06/09/24 9162   heparin  injection 5,000 Units  5,000 Units Subcutaneous Q8H Desai, Rahul P, PA-C   5,000 Units at 06/09/24 0548   hydroxychloroquine  (PLAQUENIL ) tablet 200 mg  200 mg Oral BID Desai, Rahul P, PA-C   200 mg at 06/08/24 2118   lactated ringers  infusion   Intravenous Continuous Desai, Rahul P, PA-C 50 mL/hr at 06/07/24 1800 Infusion Verify at 06/07/24 1800   levETIRAcetam  (KEPPRA ) tablet 500 mg  500 mg Oral BID Khaliqdina, Salman, MD   500 mg at 06/09/24 9162   Netarsudil -Latanoprost  0.02-0.005 % SOLN 1 drop  1 drop Right Eye QHS Laron Agent, RPH   1 drop at 06/08/24 2120   Oral care mouth rinse  15 mL Mouth Rinse PRN Maree Harder, MD       pantoprazole  (PROTONIX ) EC tablet 40 mg  40 mg Oral BID Desai, Rahul P, PA-C   40 mg at 06/09/24 9163   polyethylene glycol (MIRALAX  / GLYCOLAX ) packet 17 g  17 g Oral Daily PRN Meade, Rahul P, PA-C       rOPINIRole  (REQUIP ) tablet 0.25 mg  0.25 mg Oral QHS PRN Desai, Rahul P, PA-C   0.25 mg at 06/07/24 2205     Discharge Medications: Please see discharge summary for a list of discharge medications.  Relevant Imaging Results:  Relevant Lab Results:   Additional Information SS#: [id number]  Almarie CHRISTELLA [place], [place]

## 2024-06-09 NOTE — Discharge Summary (Signed)
 Physician Discharge Summary  Briana [name] January FMW:[id number] DOB: 19-Jan-1931 DOA: 06/06/2024  PCP: Sherlynn Madden, MD  Admit date: 06/06/2024 Discharge date: 06/09/2024  Time spent: 46 minutes  Recommendations for Outpatient Follow-up:  Needs Chem-12 CBC 1 week Recommend continued Keppra  500 twice daily for the foreseeable future given nidus of seizure being almost stroke--Follow-up with neurology please refer in the outpatient setting CAM Walker at all times for pain and discomfort with mobility secondary to ankle sprain  Discharge Diagnoses:  MAIN problem for hospitalization   Seizures secondary to prior stroke  Please see below for itemized issues addressed in [facility]- refer to other progress notes for clarity if needed  Discharge Condition: Improved  Diet recommendation: Heart healthy  Filed Weights   06/06/24 2045 06/07/24 0245 06/08/24 0500  Weight: 67.6 kg 66.6 kg 70.5 kg    History of present illness:  89 year old female history of rheumatoid arthritisOn Plaquenil , glaucoma Acute R MCA stroke 06/23/23 before present secondary to A-fib sent home on Holter DM TY 2 HTN HLD previous Neurontin  toxicity with tremors Prior shingles Right knee repair 2013, Left knee repair 2015   8/26 rheumatology eval progressive lower back abdominal pain tremor left leg-tonic-clonic seizure noted when patient was in ED Ativan  loaded Keppra  loaded muscular spasms and was obtunded CT head generalized cerebral atrophy with chronic white matter small vessel ischemic changes chronic right posterior parietal bilateral cerebellar infarct CT body no acute intrathoracic abdominal aorta abnormality no pulmonary embolism no acute other abnormality-simple renal cysts 8/27 MRI brain large old right parietal infarct encephalomalacia no change from 06/23/2023 Neurology consulted decided on EEG 8/27 EEG cortical dysfunction right posterior quadrant?  Underlying structural abnormality-no seizure or  epileptiform discharge 8/28 MRI ankle as below   Plan   Grand mal seizure in the setting of previous R MCA 2024 Ativan  Keppra  loaded currently on 500 twice daily oral Keppra  EEG nonsuggestive of other etiologies Neurology suggest continued Keppra  at discharge and follow-up in the outpatient setting-they suggest outpatient neuro follow-up given her hemianopsia on the left side with right gaze preference   Right ankle fracture/calcaneofibular/posterior talofibular tear? M RI ankle performed on 8/28 did not confirm any fracture rather tenosynovitis sprain at anterior talofibular ligament and marrow edema posterior superior calcaneus suggestive of a pretty bad sprain and she was quite tender Avoid NSAID, Tylenol  high-dose okay short-term and then Tylenol  3 for severe pain Outpatient follow-up with PCP   ?  UTI No culture data available-urine analysis positive nitrates trace leukocytes few bacteria-stop date Rocephin  updated 8/29 (3 days)-no severe signs of sepsis slightly elevated white count only which is nonspecific Completed 3 days of antibiotics at discharge   Previous CVA 2024 Continue Plavix  75, Lipitor 40   Hypokalemia on admission Replaced aggressively currently normalized   Rheumatoid arthritis on Plaquenil  Continue Plaquenil  200 twice daily outpatient follow-up with rheumatologist   Diabetes mellitus type 2 diet controlled at home-A1c 6.0 Sugar predominantly below 140-stop coverage   HTN-moderately controlled continue amlodipine  10 add back propranolol 50     RLS-Requip  0.25 at bedtime as needed B12 deficiency-1000 mcg daily replacement Glaucoma continue Cosopt  1 drop both eyes twice daily as well as latanoprost  to right eye HLD Orthopedic issues  Discharge Exam: Vitals:   06/09/24 0344 06/09/24 0820  BP: (!) 143/63 (!) 149/74  Pulse: 66 75  Resp:  18  Temp: 98.1 F (36.7 C) 98.2 F (36.8 C)  SpO2: 97% 95%    Subj on day of d/c   Looks well  feels fair pain  in the foot is still present-she is a little bit confused but overall engaging  General Exam on discharge  EOMI NCAT straight leg raise bilaterally equal limited by pain on the right side Power 5/5 upper extremities-vision not tested Throat soft supple ROM intact S1-S2 no murmur  Discharge Instructions   Discharge Instructions     Diet - low sodium heart healthy   Complete by: As directed    Discharge instructions   Complete by: As directed    Please use the walker with the cam boot at all times when mobilizing as you have a bad sprain Use Tylenol  3 for pain that is severe, you can use plain old Tylenol  1000 mg short-term for the next 5 to 6 days around-the-clock every 8 hours to help with mild pain You were admitted with what sounds like a seizure and you will need to be on Keppra  500 twice a day for the foreseeable future and follow-up in the outpatient setting It would be a good idea for you to be seen by neurology in about 3 months or your primary care in about 2 weeks and get labs at that time We will discharge you with home health For seizure activity weakness on 1 side of the body or altered mental status status severe please return to the emergency room   Increase activity slowly   Complete by: As directed       Allergies as of 06/09/2024       Reactions   Motrin  [ibuprofen ] Anaphylaxis   Percocet [oxycodone -acetaminophen ] Nausea Only   Zocor  [simvastatin ] Other (See Comments)   Myalgias    2,4-d Dimethylamine Other (See Comments)   Unknown reaction (weed killer)   Calan [verapamil] Swelling   Cardizem [diltiazem] Swelling   Angioedema    Hydrochlorothiazide Swelling   Valtrex  [valacyclovir ] Other (See Comments)   Unknown reaction   Zestril [lisinopril] Swelling        Medication List     STOP taking these medications    fexofenadine 180 MG tablet Commonly known as: ALLEGRA       TAKE these medications    acetaminophen  650 MG CR tablet Commonly  known as: TYLENOL  Take 650 mg by mouth every 8 (eight) hours as needed for pain.   acetaminophen -codeine  300-30 MG tablet Commonly known as: TYLENOL  #3 Take 1-2 tablets by mouth every 4 (four) hours as needed for moderate pain (pain score 4-6).   amLODipine  10 MG tablet Commonly known as: NORVASC  TAKE 1 TABLET EVERY DAY   atenolol  50 MG tablet Commonly known as: TENORMIN  Take 1 tablet (50 mg total) by mouth daily.   atorvastatin  40 MG tablet Commonly known as: LIPITOR Take 1 tablet (40 mg total) by mouth daily. What changed: when to take this   b complex vitamins capsule Take 1 capsule by mouth daily.   clopidogrel  75 MG tablet Commonly known as: PLAVIX  Take 1 tablet (75 mg total) by mouth daily.   dorzolamide -timolol  2-0.5 % ophthalmic solution Commonly known as: COSOPT  Place 1 drop into both eyes 2 (two) times daily.   hydroxychloroquine  200 MG tablet Commonly known as: PLAQUENIL  Take 200 mg by mouth 2 (two) times daily.   levETIRAcetam  500 MG tablet Commonly known as: KEPPRA  Take 1 tablet (500 mg total) by mouth 2 (two) times daily.   pantoprazole  40 MG tablet Commonly known as: PROTONIX  TAKE 1 TABLET TWICE DAILY   Rocklatan  0.02-0.005 % Soln Generic drug: Netarsudil -Latanoprost  Place 1 drop into  both eyes at bedtime.   rOPINIRole  0.25 MG tablet Commonly known as: REQUIP  Take 1 tablet (0.25 mg total) by mouth at bedtime as needed (Restless legs).   VITAMIN B-12 PO Take 1 tablet by mouth daily.   VITAMIN D-3 PO Take 1 capsule by mouth daily.       Allergies  Allergen Reactions   Motrin  [Ibuprofen ] Anaphylaxis   Percocet [Oxycodone -Acetaminophen ] Nausea Only   Zocor  [Simvastatin ] Other (See Comments)    Myalgias    2,4-D Dimethylamine Other (See Comments)    Unknown reaction  (weed killer)   Calan [Verapamil] Swelling   Cardizem [Diltiazem] Swelling    Angioedema    Hydrochlorothiazide Swelling   Valtrex  [Valacyclovir ] Other (See Comments)     Unknown reaction   Zestril [Lisinopril] Swelling    Follow-up Information     [facility].   Specialty: Home Health Services Why: [facility] will contact you for the first home visit. Contact information: [address] [place] [address] [phone]                  The results of significant diagnostics from this hospitalization (including imaging, microbiology, ancillary and laboratory) are listed below for reference.    Significant Diagnostic Studies: MR ANKLE RIGHT WO CONTRAST Result Date: 06/08/2024 CLINICAL DATA:  Lateral ankle pain EXAM: MRI OF THE RIGHT ANKLE WITHOUT CONTRAST TECHNIQUE: Multiplanar, multisequence MR imaging of the ankle was performed. No intravenous contrast was administered. COMPARISON:  Radiographs 10/22/2022 FINDINGS: TENDONS Peroneal: Mild peroneus longus tenosynovitis below the cuboid. Posteromedial: Distal tibialis posterior tenosynovitis and mild distal tendinopathy. Flexor hallucis longus tenosynovitis at the knot of Henry. Anterior: Unremarkable Achilles: Unremarkable Plantar Fascia: Trace edema along the medial band of the plantar fascia. LIGAMENTS Lateral: Postoperative findings along the medial attachment of the posteroinferior tibiofibular ligament. Mildly attenuated distal portion of the anterior talofibular ligament with surrounding fluid signal. Medial: Unremarkable CARTILAGE Ankle Joint: Small tibiotalar joint effusion. 6 mm nonfragmented osteochondral injury along the medial talar dome, image 15 series 6. Subtalar Joints/Sinus Tarsi: Solid subtalar fusion. Bones: Mild marrow edema along the posterosuperior calcaneus possibly from stress reaction. Low-grade marrow edema signal in the anteromedial tibial metaphysis on image 10 series 5. Other: Mild anterolateral subcutaneous edema along the ankle. IMPRESSION: 1. Mildly attenuated distal portion of the anterior talofibular ligament with surrounding fluid signal,  potentially from sprain or remote injury. 2. Mild peroneus longus tenosynovitis below the cuboid. 3. Distal tibialis posterior tenosynovitis and mild distal tendinopathy. 4. Flexor hallucis longus tenosynovitis at the knot of Henry. 5. 6 mm nonfragmented osteochondral injury along the medial talar dome. 6. Solid subtalar fusion. 7. Mild marrow edema along the posterosuperior calcaneus possibly from stress reaction. 8. Low-grade marrow edema signal in the anteromedial tibial metaphysis. 9. Small tibiotalar joint effusion. 10. Mild anterolateral subcutaneous edema along the ankle. Electronically Signed   By: Ryan Salvage M.D.   On: 06/08/2024 13:44   EEG adult Result Date: 06/07/2024 Shelton Arlin KIDD, MD     06/07/2024  2:53 PM Patient Name: ATASHA COLEBANK MRN: [id number] Epilepsy Attending: Arlin KIDD Shelton Referring Physician/Provider: Khaliqdina, Salman, MD Date: 06/07/2024 Duration: 22.03 mins Patient history: 89 y.o. female with hx of HTN, HLD, R parietal lobe cortical stroke who [facility] with left sided pain and generalized weakness. She developed L leg jerking that spread to her back and then in the ED, she had a GTC seizure in the ED  and was obtunded and unresponsive to sternal rub per notes. EEG to evaluate for seizure Level of alertness: Awake AEDs during EEG study: LEV Technical aspects: This EEG study was done with scalp electrodes positioned according to the 10-20 International system of electrode placement. Electrical activity was reviewed with band pass filter of 1-70Hz , sensitivity of 7 uV/mm, display speed of 18mm/sec with a 60Hz  notched filter applied as appropriate. EEG data were recorded continuously and digitally stored.  Video monitoring was available and reviewed as appropriate. Description: The posterior dominant rhythm consists of 8 Hz activity of moderate voltage (25-35 uV) seen predominantly in posterior head regions, symmetric and  reactive to eye opening and eye closing. EEG showed continuous 3 to 6 Hz theta-delta slowing in right posterior quadrant. Hyperventilation and photic stimulation were not performed.   ABNORMALITY - Continuous slow, right posterior quadrant. IMPRESSION: This study is suggestive of cortical dysfunction arising from right posterior quadrant likely secondary to underlying structural abnormality. No seizures or epileptiform discharges were seen throughout the recording. Arlin MALVA Krebs   MR BRAIN WO CONTRAST Result Date: 06/07/2024 CLINICAL DATA:  Neuro deficit, acute stroke suspected EXAM: MRI HEAD WITHOUT CONTRAST TECHNIQUE: Multiplanar, multiecho pulse sequences of the brain and surrounding structures were obtained without intravenous contrast. COMPARISON:  June 23, 2023 FINDINGS: MRI brain: There has an old right parietal infarct with encephalomalacia. There are old bilateral cerebellar infarcts. There is no acute infarct. There are several foci of T2 hyperintensity in the cerebral white matter. These do not have restricted diffusion. The ventricles are normal. No mass lesion. There are normal flow signals in the carotid arteries and basilar artery. No significant bone marrow signal abnormality. No significant abnormality in the paranasal sinuses or soft tissues. IMPRESSION: 1. Large old right parietal infarct with encephalomalacia 2. Old bilateral cerebellar infarcts 3. No acute abnormality. No change compared with June 23, 2023. Electronically Signed   By: Nancyann Burns M.D.   On: 06/07/2024 08:31   CT Angio Chest/Abd/Pel for Dissection W and/or W/WO Result Date: 06/06/2024 CLINICAL DATA:  Dizzy. Body pain. Shaking acute aortic syndrome (AAS) suspected Incidental pneumothorax on chest x-ray EXAM: CT ANGIOGRAPHY CHEST, ABDOMEN AND PELVIS TECHNIQUE: Non-contrast CT of the chest was initially obtained. Multidetector CT imaging through the chest, abdomen and pelvis was performed using the standard  protocol during bolus administration of intravenous contrast. Multiplanar reconstructed images and MIPs were obtained and reviewed to evaluate the vascular anatomy. RADIATION DOSE REDUCTION: This exam was performed according to the departmental dose-optimization program which includes automated exposure control, adjustment of the mA and/or kV according to patient size and/or use of iterative reconstruction technique. CONTRAST:  85mL OMNIPAQUE  IOHEXOL  350 MG/ML SOLN COMPARISON:  MR pelvis 02/04/2024, CT abdomen pelvis 12/07/2023, CT chest 09/19/2023 FINDINGS: CTA CHEST FINDINGS Cardiovascular: Preferential opacification of the thoracic aorta. No evidence of thoracic aortic aneurysm or dissection. Normal heart size. No significant pericardial effusion. Moderate atherosclerotic plaque of the thoracic aorta. Four-vessel coronary artery calcifications. The main pulmonary artery is normal in caliber. No central or segmental pulmonary embolus. Mediastinum/Nodes: No enlarged mediastinal, hilar, or axillary lymph nodes. Thyroid  gland, trachea, and esophagus demonstrate no significant findings. Lungs/Pleura: Bilateral lower lobe atelectasis. No focal consolidation. No pulmonary nodule. No pulmonary mass. No pleural effusion. No pneumothorax. Musculoskeletal: No chest wall abnormality. No suspicious lytic or blastic osseous lesions. No acute displaced fracture. Review of the MIP images confirms the above findings. CTA ABDOMEN AND PELVIS FINDINGS VASCULAR Aorta: Moderate atherosclerotic plaque. Normal caliber aorta without  aneurysm, dissection, vasculitis or significant stenosis. Celiac: Patent without evidence of aneurysm, dissection, vasculitis or significant stenosis. SMA: Patent without evidence of aneurysm, dissection, vasculitis or significant stenosis. Renals: Mild atherosclerotic plaque. Both renal arteries are patent without evidence of aneurysm, dissection, vasculitis, fibromuscular dysplasia or significant stenosis.  IMA: Patent without evidence of aneurysm, dissection, vasculitis or significant stenosis. Inflow: Mild atherosclerotic plaque. Patent without evidence of aneurysm, dissection, vasculitis or significant stenosis. Veins: No obvious venous abnormality within the limitations of this arterial phase study. Review of the MIP images confirms the above findings. NON-VASCULAR Hepatobiliary: No focal liver abnormality. No gallstones, gallbladder wall thickening, or pericholecystic fluid. No biliary dilatation. Pancreas: No focal lesion. Normal pancreatic contour. No surrounding inflammatory changes. No main pancreatic ductal dilatation. Spleen: Normal in size without focal abnormality. Adrenals/Urinary Tract: No adrenal nodule bilaterally. Bilateral kidneys enhance symmetrically. Fluid density lesion of the kidneys likely represent simple renal cysts. Left inferior renal pole scarring. No hydronephrosis. No hydroureter.  No nephroureterolithiasis. The urinary bladder is unremarkable. Stomach/Bowel: Stomach is within normal limits. No evidence of bowel wall thickening or dilatation. Appendix appears normal. Lymphatic: No lymphadenopathy. Reproductive: Uterus and bilateral adnexa are unremarkable. Other: No intraperitoneal free fluid. No intraperitoneal free gas. No organized fluid collection. Musculoskeletal: No abdominal wall hernia or abnormality. No suspicious lytic or blastic osseous lesions. No acute displaced fracture. Review of the MIP images confirms the above findings. IMPRESSION: 1. No acute thoracic and abdominal aorta abnormality. 2. No central or segmental pulmonary embolus. Limited evaluation more distally. 3. No acute intrathoracic, intra-abdominal, intrapelvic abnormality. Electronically Signed   By: Morgane  Naveau M.D.   On: 06/06/2024 23:25   CT Head Wo Contrast Result Date: 06/06/2024 CLINICAL DATA:  Dizziness and presyncope. EXAM: CT HEAD WITHOUT CONTRAST TECHNIQUE: Contiguous axial images were obtained  from the base of the skull through the vertex without intravenous contrast. RADIATION DOSE REDUCTION: This exam was performed according to the departmental dose-optimization program which includes automated exposure control, adjustment of the mA and/or kV according to patient size and/or use of iterative reconstruction technique. COMPARISON:  June 23, 2023 FINDINGS: Brain: There is generalized cerebral atrophy with widening of the extra-axial spaces and ventricular dilatation. There are areas of decreased attenuation within the white matter tracts of the supratentorial brain, consistent with microvascular disease changes. A chronic right posterior parietal lobe infarct is seen. Small, chronic bilateral cerebellar infarcts are noted. Vascular: Moderate to marked severity bilateral cavernous carotid artery calcification is noted. Skull: Normal. Negative for fracture or focal lesion. Sinuses/Orbits: No acute finding. Other: None. IMPRESSION: 1. Generalized cerebral atrophy with chronic white matter small vessel ischemic changes. 2. Chronic right posterior parietal lobe and bilateral cerebellar infarcts. 3. No acute intracranial abnormality. Electronically Signed   By: Suzen Dials M.D.   On: 06/06/2024 22:30   DG Chest Portable 1 View Result Date: 06/06/2024 CLINICAL DATA:  Weakness with body pain and dizziness. EXAM: PORTABLE CHEST 1 VIEW COMPARISON:  September 15, 2023 FINDINGS: The cardiac silhouette is mildly enlarged and unchanged in size. There is marked severity calcification of the aortic arch. Low lung volumes are noted. No acute infiltrate or pleural effusion is identified. A very thin linear opacity is seen along the periphery of the left lung base. The visualized skeletal structures are unremarkable. IMPRESSION: Findings which may represent a small pneumothorax along the lateral aspect of the left lung base. Correlation with chest CT is recommended. Electronically Signed   By: Suzen Dials  M.D.   On: 06/06/2024  22:26    Microbiology: Recent Results (from the past 240 hours)  Resp panel by RT-PCR (RSV, Flu A&B, Covid) Anterior Nasal Swab     Status: None   Collection Time: 06/06/24  9:02 PM   Specimen: Anterior Nasal Swab  Result Value Ref Range Status   SARS Coronavirus 2 by RT PCR NEGATIVE NEGATIVE Final    Comment: (NOTE) SARS-CoV-2 target nucleic acids are NOT DETECTED.  The SARS-CoV-2 RNA is generally detectable in upper respiratory specimens during the acute phase of infection. The lowest concentration of SARS-CoV-2 viral copies this assay can detect is 138 copies/mL. A negative result does not preclude SARS-Cov-2 infection and should not be used as the sole basis for treatment or other patient management decisions. A negative result may occur with  improper specimen collection/handling, submission of specimen other than nasopharyngeal swab, presence of viral mutation(s) within the areas targeted by this assay, and inadequate number of viral copies(<138 copies/mL). A negative result must be combined with clinical observations, patient history, and epidemiological information. The expected result is Negative.  Fact Sheet for Patients:  BloggerCourse.com  Fact Sheet for Healthcare Providers:  SeriousBroker.it  This test is no t yet approved or cleared by the [place]  FDA and  has been authorized for detection and/or diagnosis of SARS-CoV-2 by FDA under an Emergency Use Authorization (EUA). This EUA will remain  in effect (meaning this test can be used) for the duration of the COVID-19 declaration under Section 564(b)(1) of the Act, 21 U.S.C.section 360bbb-3(b)(1), unless the authorization is terminated  or revoked sooner.       Influenza A by PCR NEGATIVE NEGATIVE Final   Influenza B by PCR NEGATIVE NEGATIVE Final    Comment: (NOTE) The Xpert Xpress SARS-CoV-2/FLU/RSV plus assay is intended as an  aid in the diagnosis of influenza from Nasopharyngeal swab specimens and should not be used as a sole basis for treatment. Nasal washings and aspirates are unacceptable for Xpert Xpress SARS-CoV-2/FLU/RSV testing.  Fact Sheet for Patients: BloggerCourse.com  Fact Sheet for Healthcare Providers: SeriousBroker.it  This test is not yet approved or cleared by the [place]  FDA and has been authorized for detection and/or diagnosis of SARS-CoV-2 by FDA under an Emergency Use Authorization (EUA). This EUA will remain in effect (meaning this test can be used) for the duration of the COVID-19 declaration under Section 564(b)(1) of the Act, 21 U.S.C. section 360bbb-3(b)(1), unless the authorization is terminated or revoked.     Resp Syncytial Virus by PCR NEGATIVE NEGATIVE Final    Comment: (NOTE) Fact Sheet for Patients: BloggerCourse.com  Fact Sheet for Healthcare Providers: SeriousBroker.it  This test is not yet approved or cleared by the [place]  FDA and has been authorized for detection and/or diagnosis of SARS-CoV-2 by FDA under an Emergency Use Authorization (EUA). This EUA will remain in effect (meaning this test can be used) for the duration of the COVID-19 declaration under Section 564(b)(1) of the Act, 21 U.S.C. section 360bbb-3(b)(1), unless the authorization is terminated or revoked.  Performed at [facility], [address] [place], [place] [address]   MRSA Next Gen by PCR, Nasal     Status: None   Collection Time: 06/07/24  2:54 AM   Specimen: Nasal Mucosa; Nasal Swab  Result Value Ref Range Status   MRSA by PCR Next Gen NOT DETECTED NOT DETECTED Final    Comment: (NOTE) The GeneXpert MRSA Assay (FDA approved for NASAL specimens only), is one component of a comprehensive MRSA colonization  surveillance program. It is not intended to  diagnose MRSA infection nor to guide or monitor treatment for MRSA infections. Test performance is not FDA approved in patients less than 67 years old. Performed at [facility], [address], [place], [place] [address]      Labs: Basic Metabolic Panel: Recent Labs  Lab 06/06/24 2136 06/07/24 0849 06/08/24 0441 06/09/24 0333  NA 139 140 141 138  K 4.0 2.9* 3.8 3.6  CL 103 106 107 102  CO2 18* 25 23 27   GLUCOSE 148* 89 106* 113*  BUN 19 15 9 8   CREATININE 1.13* 0.97 0.79 0.88  CALCIUM  10.2 9.1 9.0 8.9  MG 2.0 2.1 1.8  --   PHOS  --  4.7* 3.3  --    Liver Function Tests: Recent Labs  Lab 06/06/24 2136  AST 47*  ALT 64*  ALKPHOS 149*  BILITOT 0.9  PROT 8.2*  ALBUMIN 4.6   Recent Labs  Lab 06/07/24 0849  LIPASE 34  AMYLASE 68   No results for input(s): AMMONIA in the last 168 hours. CBC: Recent Labs  Lab 06/06/24 2136 06/07/24 0849 06/08/24 0441 06/09/24 0333  WBC 10.7* 10.9* 10.7* 9.7  NEUTROABS 7.7  --   --   --   HGB 13.4 11.4* 11.3* 11.5*  HCT 39.1 33.0* 33.0* 33.9*  MCV 89.7 88.5 88.9 88.7  PLT [phone] [id number]   Cardiac Enzymes: No results for input(s): CKTOTAL, CKMB, CKMBINDEX, TROPONINI in the last 168 hours. BNP: BNP (last 3 results) No results for input(s): BNP in the last 8760 hours.  ProBNP (last 3 results) No results for input(s): PROBNP in the last 8760 hours.  CBG: Recent Labs  Lab 06/08/24 1336 06/08/24 1605 06/08/24 2020 06/08/24 2357 06/09/24 0347  GLUCAP 100* 163* 129* 108* 103*    Signed:  Colen Grimes MD   [facility] 06/09/2024, 9:53 AM

## 2024-06-10 DIAGNOSIS — M6259 Muscle wasting and atrophy, not elsewhere classified, multiple sites: Secondary | ICD-10-CM | POA: Diagnosis not present

## 2024-06-10 DIAGNOSIS — M069 Rheumatoid arthritis, unspecified: Secondary | ICD-10-CM | POA: Diagnosis not present

## 2024-06-10 DIAGNOSIS — Z741 Need for assistance with personal care: Secondary | ICD-10-CM | POA: Diagnosis not present

## 2024-06-10 DIAGNOSIS — E119 Type 2 diabetes mellitus without complications: Secondary | ICD-10-CM | POA: Diagnosis not present

## 2024-06-10 DIAGNOSIS — G40901 Epilepsy, unspecified, not intractable, with status epilepticus: Secondary | ICD-10-CM | POA: Diagnosis not present

## 2024-06-10 DIAGNOSIS — R262 Difficulty in walking, not elsewhere classified: Secondary | ICD-10-CM | POA: Diagnosis not present

## 2024-06-10 DIAGNOSIS — R569 Unspecified convulsions: Secondary | ICD-10-CM | POA: Diagnosis not present

## 2024-06-10 DIAGNOSIS — Z7401 Bed confinement status: Secondary | ICD-10-CM | POA: Diagnosis not present

## 2024-06-10 DIAGNOSIS — G4089 Other seizures: Secondary | ICD-10-CM | POA: Diagnosis not present

## 2024-06-10 NOTE — Discharge Summary (Signed)
 Physician Discharge Summary  Briana [name] FMW:[id number] DOB: 1931-07-31 DOA: 06/06/2024  PCP: Sherlynn Madden, MD  Dc updated with changes---ok for SNF this am  Admit date: 06/06/2024 Discharge date: 06/10/2024  Time spent: 46 minutes  Recommendations for Outpatient Follow-up:  Needs Chem-12 CBC 1 week Recommend continued Keppra  500 twice daily for the foreseeable future given nidus of seizure being almost stroke--Follow-up with neurology please refer in the outpatient setting CAM Walker at all times for pain and discomfort with mobility secondary to ankle sprain Has underlyin refluex---ensure that she is on her PPI  Discharge Diagnoses:  MAIN problem for hospitalization   Seizures secondary to prior stroke  Please see below for itemized issues addressed in HOpsital- refer to other progress notes for clarity if needed  Discharge Condition: Improved  Diet recommendation: Heart healthy  Filed Weights   06/06/24 2045 06/07/24 0245 06/08/24 0500  Weight: 67.6 kg 66.6 kg 70.5 kg    History of present illness:  89 year old female history of rheumatoid arthritisOn Plaquenil , glaucoma Acute R MCA stroke 06/23/23 before present secondary to A-fib sent home on Holter DM TY 2 HTN HLD previous Neurontin  toxicity with tremors Prior shingles Reflux requiring PPI Right knee repair 2013, Left knee repair 2015   8/26 rheumatology eval progressive lower back abdominal pain tremor left leg-tonic-clonic seizure noted when patient was in ED Ativan  loaded Keppra  loaded muscular spasms and was obtunded CT head generalized cerebral atrophy with chronic white matter small vessel ischemic changes chronic right posterior parietal bilateral cerebellar infarct CT body no acute intrathoracic abdominal aorta abnormality no pulmonary embolism no acute other abnormality-simple renal cysts 8/27 MRI brain large old right parietal infarct encephalomalacia no change from 06/23/2023 Neurology consulted  decided on EEG 8/27 EEG cortical dysfunction right posterior quadrant?  Underlying structural abnormality-no seizure or epileptiform discharge 8/28 MRI ankle as below   Plan   Grand mal seizure in the setting of previous R MCA 2024 Ativan  Keppra  loaded currently on 500 twice daily oral Keppra  and will continue this long term or as per neuro--she doesn't drive EEG nonsuggestive of other etiologies Neurology suggest continued Keppra  at discharge and follow-up in the outpatient setting-they suggest outpatient neuro follow-up given her hemianopsia on the left side with right gaze preference   Right ankle fracture/calcaneofibular/posterior talofibular tear? M RI ankle performed on 8/28 did not confirm any fracture rather tenosynovitis sprain at anterior talofibular ligament and marrow edema posterior superior calcaneus suggestive of a pretty bad sprain and she was quite tender--Cam walker at all times mobilizing--OP ortho referral to preferred ortho in 2-3 weeks for re-eval area Avoid NSAID, Tylenol  high-dose okay short-term and then Tylenol  3 for severe pain Outpatient follow-up with PCP   ?  UTI No culture data available-urine analysis positive nitrates trace leukocytes few bacteria-stop date Rocephin  updated 8/29 (3 days)-no severe signs of sepsis slightly elevated white count only which is nonspecific Completed 3 days of antibiotics at discharge--overall has remained stable   Previous CVA 2024 Continue Plavix  75, Lipitor 40   Hypokalemia on admission Replaced aggressively currently normalized--labs in 2-3 days at SNF   Rheumatoid arthritis on Plaquenil  Continue Plaquenil  200 twice daily outpatient follow-up with rheumatologist   Diabetes mellitus type 2 diet controlled at home-A1c 6.0 Sugar predominantly below 140-stop coverage   HTN-moderately controlled continue amlodipine  10 add back propranolol 50     RLS-Requip  0.25 at bedtime as needed B12 deficiency-1000 mcg daily  replacement Glaucoma continue Cosopt  1 drop both eyes twice daily as well  as latanoprost  to right eye HLD Orthopedic issues  Discharge Exam: Vitals:   06/10/24 0335 06/10/24 0732  BP: 135/61 (!) 153/60  Pulse: 64 75  Resp: 18 18  Temp: 98.6 F (37 C) 99 F (37.2 C)  SpO2: 97% 98%    Subj on day of d/c   Looks well feels fair pain in the foot is still present-much mor ewith it Some abd pain from lovenox --occasional [chronic] vomit without her PPI  General Exam on discharge  EOMI NCAT straight leg raise bilaterally equal limited by pain on the right side Power 5/5 upper extremities-vision not tested Throat soft supple--no thrush Abd soft nt nd no rebound no guard ROM intact S1-S2 no murmur  Discharge Instructions   Discharge Instructions     Diet - low sodium heart healthy   Complete by: As directed    Discharge instructions   Complete by: As directed    Please use the walker with the cam boot at all times when mobilizing as you have a bad sprain Use Tylenol  3 for pain that is severe, you can use plain old Tylenol  1000 mg short-term for the next 5 to 6 days around-the-clock every 8 hours to help with mild pain You were admitted with what sounds like a seizure and you will need to be on Keppra  500 twice a day for the foreseeable future and follow-up in the outpatient setting It would be a good idea for you to be seen by neurology in about 3 months or your primary care in about 2 weeks and get labs at that time We will discharge you with home health For seizure activity weakness on 1 side of the body or altered mental status status severe please return to the emergency room   Increase activity slowly   Complete by: As directed       Allergies as of 06/10/2024       Reactions   Motrin  [ibuprofen ] Anaphylaxis   Percocet [oxycodone -acetaminophen ] Nausea Only   Zocor  [simvastatin ] Other (See Comments)   Myalgias    2,4-d Dimethylamine Other (See Comments)   Unknown  reaction (weed killer)   Calan [verapamil] Swelling   Cardizem [diltiazem] Swelling   Angioedema    Hydrochlorothiazide Swelling   Valtrex  [valacyclovir ] Other (See Comments)   Unknown reaction   Zestril [lisinopril] Swelling        Medication List     STOP taking these medications    fexofenadine 180 MG tablet Commonly known as: ALLEGRA       TAKE these medications    acetaminophen  650 MG CR tablet Commonly known as: TYLENOL  Take 650 mg by mouth every 8 (eight) hours as needed for pain.   acetaminophen -codeine  300-30 MG tablet Commonly known as: TYLENOL  #3 Take 1-2 tablets by mouth every 4 (four) hours as needed for moderate pain (pain score 4-6).   amLODipine  10 MG tablet Commonly known as: NORVASC  TAKE 1 TABLET EVERY DAY   atenolol  50 MG tablet Commonly known as: TENORMIN  Take 1 tablet (50 mg total) by mouth daily.   atorvastatin  40 MG tablet Commonly known as: LIPITOR Take 1 tablet (40 mg total) by mouth daily. What changed: when to take this   b complex vitamins capsule Take 1 capsule by mouth daily.   clopidogrel  75 MG tablet Commonly known as: PLAVIX  Take 1 tablet (75 mg total) by mouth daily.   dorzolamide -timolol  2-0.5 % ophthalmic solution Commonly known as: COSOPT  Place 1 drop into both eyes 2 (two) times  daily.   hydroxychloroquine  200 MG tablet Commonly known as: PLAQUENIL  Take 200 mg by mouth 2 (two) times daily.   levETIRAcetam  500 MG tablet Commonly known as: KEPPRA  Take 1 tablet (500 mg total) by mouth 2 (two) times daily.   pantoprazole  40 MG tablet Commonly known as: PROTONIX  TAKE 1 TABLET TWICE DAILY   Rocklatan  0.02-0.005 % Soln Generic drug: Netarsudil -Latanoprost  Place 1 drop into both eyes at bedtime.   rOPINIRole  0.25 MG tablet Commonly known as: REQUIP  Take 1 tablet (0.25 mg total) by mouth at bedtime as needed (Restless legs).   VITAMIN B-12 PO Take 1 tablet by mouth daily.   VITAMIN D-3 PO Take 1 capsule by  mouth daily.       Allergies  Allergen Reactions   Motrin  [Ibuprofen ] Anaphylaxis   Percocet [Oxycodone -Acetaminophen ] Nausea Only   Zocor  [Simvastatin ] Other (See Comments)    Myalgias    2,4-D Dimethylamine Other (See Comments)    Unknown reaction  (weed killer)   Calan [Verapamil] Swelling   Cardizem [Diltiazem] Swelling    Angioedema    Hydrochlorothiazide Swelling   Valtrex  [Valacyclovir ] Other (See Comments)    Unknown reaction   Zestril [Lisinopril] Swelling     Contact information for after-discharge care     Destination     [facility] .   Service: Skilled Nursing Contact information: [address] [place]  [address] [phone]                      The results of significant diagnostics from this hospitalization (including imaging, microbiology, ancillary and laboratory) are listed below for reference.    Significant Diagnostic Studies: MR ANKLE RIGHT WO CONTRAST Result Date: 06/08/2024 CLINICAL DATA:  Lateral ankle pain EXAM: MRI OF THE RIGHT ANKLE WITHOUT CONTRAST TECHNIQUE: Multiplanar, multisequence MR imaging of the ankle was performed. No intravenous contrast was administered. COMPARISON:  Radiographs 10/22/2022 FINDINGS: TENDONS Peroneal: Mild peroneus longus tenosynovitis below the cuboid. Posteromedial: Distal tibialis posterior tenosynovitis and mild distal tendinopathy. Flexor hallucis longus tenosynovitis at the knot of Henry. Anterior: Unremarkable Achilles: Unremarkable Plantar Fascia: Trace edema along the medial band of the plantar fascia. LIGAMENTS Lateral: Postoperative findings along the medial attachment of the posteroinferior tibiofibular ligament. Mildly attenuated distal portion of the anterior talofibular ligament with surrounding fluid signal. Medial: Unremarkable CARTILAGE Ankle Joint: Small tibiotalar joint effusion. 6 mm nonfragmented osteochondral injury along the medial talar  dome, image 15 series 6. Subtalar Joints/Sinus Tarsi: Solid subtalar fusion. Bones: Mild marrow edema along the posterosuperior calcaneus possibly from stress reaction. Low-grade marrow edema signal in the anteromedial tibial metaphysis on image 10 series 5. Other: Mild anterolateral subcutaneous edema along the ankle. IMPRESSION: 1. Mildly attenuated distal portion of the anterior talofibular ligament with surrounding fluid signal, potentially from sprain or remote injury. 2. Mild peroneus longus tenosynovitis below the cuboid. 3. Distal tibialis posterior tenosynovitis and mild distal tendinopathy. 4. Flexor hallucis longus tenosynovitis at the knot of Henry. 5. 6 mm nonfragmented osteochondral injury along the medial talar dome. 6. Solid subtalar fusion. 7. Mild marrow edema along the posterosuperior calcaneus possibly from stress reaction. 8. Low-grade marrow edema signal in the anteromedial tibial metaphysis. 9. Small tibiotalar joint effusion. 10. Mild anterolateral subcutaneous edema along the ankle. Electronically Signed   By: Ryan Salvage M.D.   On: 06/08/2024 13:44   EEG adult Result Date: 06/07/2024 Shelton Arlin KIDD, MD     06/07/2024  2:53 PM Patient Name: Mont  EDESSA JAKUBOWICZ MRN: [id number] Epilepsy Attending: Arlin MALVA Krebs Referring Physician/Provider: Khaliqdina, Salman, MD Date: 06/07/2024 Duration: 22.03 mins Patient history: 89 y.o. female with hx of HTN, HLD, R parietal lobe cortical stroke who [facility] with left sided pain and generalized weakness. She developed L leg jerking that spread to her back and then in the ED, she had a GTC seizure in the ED and was obtunded and unresponsive to sternal rub per notes. EEG to evaluate for seizure Level of alertness: Awake AEDs during EEG study: LEV Technical aspects: This EEG study was done with scalp electrodes positioned according to the 10-20 International system of electrode placement. Electrical  activity was reviewed with band pass filter of 1-70Hz , sensitivity of 7 uV/mm, display speed of 84mm/sec with a 60Hz  notched filter applied as appropriate. EEG data were recorded continuously and digitally stored.  Video monitoring was available and reviewed as appropriate. Description: The posterior dominant rhythm consists of 8 Hz activity of moderate voltage (25-35 uV) seen predominantly in posterior head regions, symmetric and reactive to eye opening and eye closing. EEG showed continuous 3 to 6 Hz theta-delta slowing in right posterior quadrant. Hyperventilation and photic stimulation were not performed.   ABNORMALITY - Continuous slow, right posterior quadrant. IMPRESSION: This study is suggestive of cortical dysfunction arising from right posterior quadrant likely secondary to underlying structural abnormality. No seizures or epileptiform discharges were seen throughout the recording. Arlin MALVA Krebs   MR BRAIN WO CONTRAST Result Date: 06/07/2024 CLINICAL DATA:  Neuro deficit, acute stroke suspected EXAM: MRI HEAD WITHOUT CONTRAST TECHNIQUE: Multiplanar, multiecho pulse sequences of the brain and surrounding structures were obtained without intravenous contrast. COMPARISON:  June 23, 2023 FINDINGS: MRI brain: There has an old right parietal infarct with encephalomalacia. There are old bilateral cerebellar infarcts. There is no acute infarct. There are several foci of T2 hyperintensity in the cerebral white matter. These do not have restricted diffusion. The ventricles are normal. No mass lesion. There are normal flow signals in the carotid arteries and basilar artery. No significant bone marrow signal abnormality. No significant abnormality in the paranasal sinuses or soft tissues. IMPRESSION: 1. Large old right parietal infarct with encephalomalacia 2. Old bilateral cerebellar infarcts 3. No acute abnormality. No change compared with June 23, 2023. Electronically Signed   By: Nancyann Burns M.D.    On: 06/07/2024 08:31   CT Angio Chest/Abd/Pel for Dissection W and/or W/WO Result Date: 06/06/2024 CLINICAL DATA:  Dizzy. Body pain. Shaking acute aortic syndrome (AAS) suspected Incidental pneumothorax on chest x-ray EXAM: CT ANGIOGRAPHY CHEST, ABDOMEN AND PELVIS TECHNIQUE: Non-contrast CT of the chest was initially obtained. Multidetector CT imaging through the chest, abdomen and pelvis was performed using the standard protocol during bolus administration of intravenous contrast. Multiplanar reconstructed images and MIPs were obtained and reviewed to evaluate the vascular anatomy. RADIATION DOSE REDUCTION: This exam was performed according to the departmental dose-optimization program which includes automated exposure control, adjustment of the mA and/or kV according to patient size and/or use of iterative reconstruction technique. CONTRAST:  85mL OMNIPAQUE  IOHEXOL  350 MG/ML SOLN COMPARISON:  MR pelvis 02/04/2024, CT abdomen pelvis 12/07/2023, CT chest 09/19/2023 FINDINGS: CTA CHEST FINDINGS Cardiovascular: Preferential opacification of the thoracic aorta. No evidence of thoracic aortic aneurysm or dissection. Normal heart size. No significant pericardial effusion. Moderate atherosclerotic plaque of the thoracic aorta. Four-vessel coronary artery calcifications. The main pulmonary artery is normal in caliber. No central or segmental pulmonary embolus. Mediastinum/Nodes: No enlarged mediastinal,  hilar, or axillary lymph nodes. Thyroid  gland, trachea, and esophagus demonstrate no significant findings. Lungs/Pleura: Bilateral lower lobe atelectasis. No focal consolidation. No pulmonary nodule. No pulmonary mass. No pleural effusion. No pneumothorax. Musculoskeletal: No chest wall abnormality. No suspicious lytic or blastic osseous lesions. No acute displaced fracture. Review of the MIP images confirms the above findings. CTA ABDOMEN AND PELVIS FINDINGS VASCULAR Aorta: Moderate atherosclerotic plaque. Normal  caliber aorta without aneurysm, dissection, vasculitis or significant stenosis. Celiac: Patent without evidence of aneurysm, dissection, vasculitis or significant stenosis. SMA: Patent without evidence of aneurysm, dissection, vasculitis or significant stenosis. Renals: Mild atherosclerotic plaque. Both renal arteries are patent without evidence of aneurysm, dissection, vasculitis, fibromuscular dysplasia or significant stenosis. IMA: Patent without evidence of aneurysm, dissection, vasculitis or significant stenosis. Inflow: Mild atherosclerotic plaque. Patent without evidence of aneurysm, dissection, vasculitis or significant stenosis. Veins: No obvious venous abnormality within the limitations of this arterial phase study. Review of the MIP images confirms the above findings. NON-VASCULAR Hepatobiliary: No focal liver abnormality. No gallstones, gallbladder wall thickening, or pericholecystic fluid. No biliary dilatation. Pancreas: No focal lesion. Normal pancreatic contour. No surrounding inflammatory changes. No main pancreatic ductal dilatation. Spleen: Normal in size without focal abnormality. Adrenals/Urinary Tract: No adrenal nodule bilaterally. Bilateral kidneys enhance symmetrically. Fluid density lesion of the kidneys likely represent simple renal cysts. Left inferior renal pole scarring. No hydronephrosis. No hydroureter.  No nephroureterolithiasis. The urinary bladder is unremarkable. Stomach/Bowel: Stomach is within normal limits. No evidence of bowel wall thickening or dilatation. Appendix appears normal. Lymphatic: No lymphadenopathy. Reproductive: Uterus and bilateral adnexa are unremarkable. Other: No intraperitoneal free fluid. No intraperitoneal free gas. No organized fluid collection. Musculoskeletal: No abdominal wall hernia or abnormality. No suspicious lytic or blastic osseous lesions. No acute displaced fracture. Review of the MIP images confirms the above findings. IMPRESSION: 1. No acute  thoracic and abdominal aorta abnormality. 2. No central or segmental pulmonary embolus. Limited evaluation more distally. 3. No acute intrathoracic, intra-abdominal, intrapelvic abnormality. Electronically Signed   By: Morgane  Naveau M.D.   On: 06/06/2024 23:25   CT Head Wo Contrast Result Date: 06/06/2024 CLINICAL DATA:  Dizziness and presyncope. EXAM: CT HEAD WITHOUT CONTRAST TECHNIQUE: Contiguous axial images were obtained from the base of the skull through the vertex without intravenous contrast. RADIATION DOSE REDUCTION: This exam was performed according to the departmental dose-optimization program which includes automated exposure control, adjustment of the mA and/or kV according to patient size and/or use of iterative reconstruction technique. COMPARISON:  June 23, 2023 FINDINGS: Brain: There is generalized cerebral atrophy with widening of the extra-axial spaces and ventricular dilatation. There are areas of decreased attenuation within the white matter tracts of the supratentorial brain, consistent with microvascular disease changes. A chronic right posterior parietal lobe infarct is seen. Small, chronic bilateral cerebellar infarcts are noted. Vascular: Moderate to marked severity bilateral cavernous carotid artery calcification is noted. Skull: Normal. Negative for fracture or focal lesion. Sinuses/Orbits: No acute finding. Other: None. IMPRESSION: 1. Generalized cerebral atrophy with chronic white matter small vessel ischemic changes. 2. Chronic right posterior parietal lobe and bilateral cerebellar infarcts. 3. No acute intracranial abnormality. Electronically Signed   By: Suzen Dials M.D.   On: 06/06/2024 22:30   DG Chest Portable 1 View Result Date: 06/06/2024 CLINICAL DATA:  Weakness with body pain and dizziness. EXAM: PORTABLE CHEST 1 VIEW COMPARISON:  September 15, 2023 FINDINGS: The cardiac silhouette is mildly enlarged and unchanged in size. There is marked severity calcification  of the  aortic arch. Low lung volumes are noted. No acute infiltrate or pleural effusion is identified. A very thin linear opacity is seen along the periphery of the left lung base. The visualized skeletal structures are unremarkable. IMPRESSION: Findings which may represent a small pneumothorax along the lateral aspect of the left lung base. Correlation with chest CT is recommended. Electronically Signed   By: Suzen Dials M.D.   On: 06/06/2024 22:26    Microbiology: Recent Results (from the past 240 hours)  Resp panel by RT-PCR (RSV, Flu A&B, Covid) Anterior Nasal Swab     Status: None   Collection Time: 06/06/24  9:02 PM   Specimen: Anterior Nasal Swab  Result Value Ref Range Status   SARS Coronavirus 2 by RT PCR NEGATIVE NEGATIVE Final    Comment: (NOTE) SARS-CoV-2 target nucleic acids are NOT DETECTED.  The SARS-CoV-2 RNA is generally detectable in upper respiratory specimens during the acute phase of infection. The lowest concentration of SARS-CoV-2 viral copies this assay can detect is 138 copies/mL. A negative result does not preclude SARS-Cov-2 infection and should not be used as the sole basis for treatment or other patient management decisions. A negative result may occur with  improper specimen collection/handling, submission of specimen other than nasopharyngeal swab, presence of viral mutation(s) within the areas targeted by this assay, and inadequate number of viral copies(<138 copies/mL). A negative result must be combined with clinical observations, patient history, and epidemiological information. The expected result is Negative.  Fact Sheet for Patients:  BloggerCourse.com  Fact Sheet for Healthcare Providers:  SeriousBroker.it  This test is no t yet approved or cleared by the [place]  FDA and  has been authorized for detection and/or diagnosis of SARS-CoV-2 by FDA under an Emergency Use Authorization  (EUA). This EUA will remain  in effect (meaning this test can be used) for the duration of the COVID-19 declaration under Section 564(b)(1) of the Act, 21 U.S.C.section 360bbb-3(b)(1), unless the authorization is terminated  or revoked sooner.       Influenza A by PCR NEGATIVE NEGATIVE Final   Influenza B by PCR NEGATIVE NEGATIVE Final    Comment: (NOTE) The Xpert Xpress SARS-CoV-2/FLU/RSV plus assay is intended as an aid in the diagnosis of influenza from Nasopharyngeal swab specimens and should not be used as a sole basis for treatment. Nasal washings and aspirates are unacceptable for Xpert Xpress SARS-CoV-2/FLU/RSV testing.  Fact Sheet for Patients: BloggerCourse.com  Fact Sheet for Healthcare Providers: SeriousBroker.it  This test is not yet approved or cleared by the [place]  FDA and has been authorized for detection and/or diagnosis of SARS-CoV-2 by FDA under an Emergency Use Authorization (EUA). This EUA will remain in effect (meaning this test can be used) for the duration of the COVID-19 declaration under Section 564(b)(1) of the Act, 21 U.S.C. section 360bbb-3(b)(1), unless the authorization is terminated or revoked.     Resp Syncytial Virus by PCR NEGATIVE NEGATIVE Final    Comment: (NOTE) Fact Sheet for Patients: BloggerCourse.com  Fact Sheet for Healthcare Providers: SeriousBroker.it  This test is not yet approved or cleared by the [place]  FDA and has been authorized for detection and/or diagnosis of SARS-CoV-2 by FDA under an Emergency Use Authorization (EUA). This EUA will remain in effect (meaning this test can be used) for the duration of the COVID-19 declaration under Section 564(b)(1) of the Act, 21 U.S.C. section 360bbb-3(b)(1), unless the authorization is terminated or revoked.  Performed at [facility], [address],  [place], [place] [address]   MRSA Next Gen by PCR, Nasal     Status: None   Collection Time: 06/07/24  2:54 AM   Specimen: Nasal Mucosa; Nasal Swab  Result Value Ref Range Status   MRSA by PCR Next Gen NOT DETECTED NOT DETECTED Final    Comment: (NOTE) The GeneXpert MRSA Assay (FDA approved for NASAL specimens only), is one component of a comprehensive MRSA colonization surveillance program. It is not intended to diagnose MRSA infection nor to guide or monitor treatment for MRSA infections. Test performance is not FDA approved in patients less than 35 years old. Performed at [facility], [address] [place], [place] [address]      Labs: Basic Metabolic Panel: Recent Labs  Lab 06/06/24 2136 06/07/24 0849 06/08/24 0441 06/09/24 0333  NA 139 140 141 138  K 4.0 2.9* 3.8 3.6  CL 103 106 107 102  CO2 18* 25 23 27   GLUCOSE 148* 89 106* 113*  BUN 19 15 9 8   CREATININE 1.13* 0.97 0.79 0.88  CALCIUM  10.2 9.1 9.0 8.9  MG 2.0 2.1 1.8  --   PHOS  --  4.7* 3.3  --    Liver Function Tests: Recent Labs  Lab 06/06/24 2136  AST 47*  ALT 64*  ALKPHOS 149*  BILITOT 0.9  PROT 8.2*  ALBUMIN 4.6   Recent Labs  Lab 06/07/24 0849  LIPASE 34  AMYLASE 68   No results for input(s): AMMONIA in the last 168 hours. CBC: Recent Labs  Lab 06/06/24 2136 06/07/24 0849 06/08/24 0441 06/09/24 0333  WBC 10.7* 10.9* 10.7* 9.7  NEUTROABS 7.7  --   --   --   HGB 13.4 11.4* 11.3* 11.5*  HCT 39.1 33.0* 33.0* 33.9*  MCV 89.7 88.5 88.9 88.7  PLT 205 189 176 186   Cardiac Enzymes: No results for input(s): CKTOTAL, CKMB, CKMBINDEX, TROPONINI in the last 168 hours. BNP: BNP (last 3 results) No results for input(s): BNP in the last 8760 hours.  ProBNP (last 3 results) No results for input(s): PROBNP in the last 8760 hours.  CBG: Recent Labs  Lab 06/08/24 1336 06/08/24 1605 06/08/24 2020 06/08/24 2357 06/09/24 0347  GLUCAP 100* 163* 129* 108* 103*     Signed:  Colen Grimes MD   [facility] 06/10/2024, 8:48 AM

## 2024-06-10 NOTE — TOC Transition Note (Signed)
 Transition of Care [facility]) - Discharge Note   Patient Details  Name: Briana [name] MRN: [id number] Date of Birth: [birth year]  Transition of Care [facility]) CM/SW Contact:  Gwenn Julien Norris, LCSW Phone Number: 06/10/2024, 10:24 AM   Clinical Narrative: Pt for dc to [facility] today. Spoke to Darrian at [facility] who confirmed they are prepared to admit pt to room 804. Pt's dtr [name] at bedside and agreeable to dc. RN provided with number for report and PTAR arranged for transport. SW signing off at dc.   Julien Gwenn, MSW, LCSW [phone] (coverage)        Final next level of care: Skilled Nursing Facility Barriers to Discharge: Barriers Resolved   Patient Goals and CMS Choice   CMS Medicare.gov Compare Post Acute Care list provided to:: Other (Comment Required) (daughter) Choice offered to / list presented to : Adult Children [facility] ownership interest in [facility].provided to:: Adult Children    Discharge Placement              Patient chooses bed at: [facility] Patient to be transferred to facility by: PTAR Name of family member notified: [name]/dtr Patient and family notified of of transfer: 06/10/24  Discharge Plan and Services Additional resources added to the After Visit Summary for                                       Social Drivers of Health (SDOH) Interventions SDOH Screenings   Food Insecurity: Unknown (06/07/2024)  Housing: Unknown (06/07/2024)  Transportation Needs: No Transportation Needs (06/29/2023)  Utilities: Not At Risk (06/24/2023)  Alcohol Screen: Low Risk  (12/10/2022)  Depression (PHQ2-9): Low Risk  (04/12/2024)  Financial Resource Strain: Low Risk  (12/10/2022)  Physical Activity: Insufficiently Active (12/10/2022)  Social Connections: Moderately Integrated (12/10/2022)  Stress: No Stress Concern Present (12/10/2022)  Tobacco Use: Low Risk  (06/06/2024)     Readmission Risk Interventions     No data to display

## 2024-06-10 NOTE — Progress Notes (Signed)
 Report called to Vernell, LPN at [facility].

## 2024-06-13 DIAGNOSIS — I10 Essential (primary) hypertension: Secondary | ICD-10-CM | POA: Diagnosis not present

## 2024-06-13 DIAGNOSIS — M6281 Muscle weakness (generalized): Secondary | ICD-10-CM | POA: Diagnosis not present

## 2024-06-13 DIAGNOSIS — E119 Type 2 diabetes mellitus without complications: Secondary | ICD-10-CM | POA: Diagnosis not present

## 2024-06-13 DIAGNOSIS — R569 Unspecified convulsions: Secondary | ICD-10-CM | POA: Diagnosis not present

## 2024-06-13 DIAGNOSIS — D84821 Immunodeficiency due to drugs: Secondary | ICD-10-CM | POA: Diagnosis not present

## 2024-06-13 DIAGNOSIS — G40901 Epilepsy, unspecified, not intractable, with status epilepticus: Secondary | ICD-10-CM | POA: Diagnosis not present

## 2024-06-13 DIAGNOSIS — Z8673 Personal history of transient ischemic attack (TIA), and cerebral infarction without residual deficits: Secondary | ICD-10-CM | POA: Diagnosis not present

## 2024-06-13 DIAGNOSIS — M069 Rheumatoid arthritis, unspecified: Secondary | ICD-10-CM | POA: Diagnosis not present

## 2024-06-13 DIAGNOSIS — N390 Urinary tract infection, site not specified: Secondary | ICD-10-CM | POA: Diagnosis not present

## 2024-06-13 DIAGNOSIS — S93401A Sprain of unspecified ligament of right ankle, initial encounter: Secondary | ICD-10-CM | POA: Diagnosis not present

## 2024-06-13 DIAGNOSIS — R2689 Other abnormalities of gait and mobility: Secondary | ICD-10-CM | POA: Diagnosis not present

## 2024-06-13 DIAGNOSIS — I6930 Unspecified sequelae of cerebral infarction: Secondary | ICD-10-CM | POA: Diagnosis not present

## 2024-06-13 DIAGNOSIS — E876 Hypokalemia: Secondary | ICD-10-CM | POA: Diagnosis not present

## 2024-06-16 DIAGNOSIS — R569 Unspecified convulsions: Secondary | ICD-10-CM | POA: Diagnosis not present

## 2024-06-16 DIAGNOSIS — M069 Rheumatoid arthritis, unspecified: Secondary | ICD-10-CM | POA: Diagnosis not present

## 2024-06-16 DIAGNOSIS — S93401A Sprain of unspecified ligament of right ankle, initial encounter: Secondary | ICD-10-CM | POA: Diagnosis not present

## 2024-06-16 DIAGNOSIS — E119 Type 2 diabetes mellitus without complications: Secondary | ICD-10-CM | POA: Diagnosis not present

## 2024-06-16 DIAGNOSIS — N390 Urinary tract infection, site not specified: Secondary | ICD-10-CM | POA: Diagnosis not present

## 2024-06-16 DIAGNOSIS — I6930 Unspecified sequelae of cerebral infarction: Secondary | ICD-10-CM | POA: Diagnosis not present

## 2024-06-16 DIAGNOSIS — E876 Hypokalemia: Secondary | ICD-10-CM | POA: Diagnosis not present

## 2024-06-16 DIAGNOSIS — I10 Essential (primary) hypertension: Secondary | ICD-10-CM | POA: Diagnosis not present

## 2024-06-19 ENCOUNTER — Telehealth: Payer: Self-pay | Admitting: *Deleted

## 2024-06-19 MED ORDER — ONDANSETRON 4 MG PO TBDP: 4.0000 mg | ORAL_TABLET | Freq: Three times a day (TID) | ORAL | 0 refills | Status: DC | PRN

## 2024-06-19 NOTE — Telephone Encounter (Signed)
 Daughter notified

## 2024-06-19 NOTE — Telephone Encounter (Signed)
 Copied from CRM [id number]. Topic: Clinical - Medical Advice >> Jun 16, 2024  3:14 PM Alfonso ORN wrote: Reason for CRM:  patient daughter [name] (Daughter) requesting if Veludandi,Prashanthi,Md can prescribe medication zofran  for patient nausea was given to patient went she was rehab and seems to help as needed , want to mention that [facility]

## 2024-06-20 ENCOUNTER — Ambulatory Visit (INDEPENDENT_AMBULATORY_CARE_PROVIDER_SITE_OTHER): Admitting: Sports Medicine

## 2024-06-20 ENCOUNTER — Encounter: Payer: Self-pay | Admitting: Sports Medicine

## 2024-06-20 VITALS — BP 128/68 | HR 60 | Temp 97.8°F | Resp 18 | Ht 63.0 in | Wt 148.8 lb

## 2024-06-20 DIAGNOSIS — Z09 Encounter for follow-up examination after completed treatment for conditions other than malignant neoplasm: Secondary | ICD-10-CM

## 2024-06-20 DIAGNOSIS — M25571 Pain in right ankle and joints of right foot: Secondary | ICD-10-CM | POA: Diagnosis not present

## 2024-06-20 DIAGNOSIS — R110 Nausea: Secondary | ICD-10-CM | POA: Diagnosis not present

## 2024-06-20 DIAGNOSIS — R569 Unspecified convulsions: Secondary | ICD-10-CM | POA: Diagnosis not present

## 2024-06-20 DIAGNOSIS — B351 Tinea unguium: Secondary | ICD-10-CM

## 2024-06-20 LAB — COMPREHENSIVE METABOLIC PANEL WITH GFR
AG Ratio: 1.2 (calc) (ref 1.0–2.5)
ALT: 95 U/L — ABNORMAL HIGH (ref 6–29)
AST: 60 U/L — ABNORMAL HIGH (ref 10–35)
Albumin: 3.7 g/dL (ref 3.6–5.1)
Alkaline phosphatase (APISO): 107 U/L (ref 37–153)
BUN/Creatinine Ratio: 11 (calc) (ref 6–22)
BUN: 14 mg/dL (ref 7–25)
CO2: 31 mmol/L (ref 20–32)
Calcium: 9.3 mg/dL (ref 8.6–10.4)
Chloride: 104 mmol/L (ref 98–110)
Creat: 1.22 mg/dL — ABNORMAL HIGH (ref 0.60–0.95)
Globulin: 3.1 g/dL (ref 1.9–3.7)
Glucose, Bld: 120 mg/dL — ABNORMAL HIGH (ref 65–99)
Potassium: 3.6 mmol/L (ref 3.5–5.3)
Sodium: 142 mmol/L (ref 135–146)
Total Bilirubin: 0.5 mg/dL (ref 0.2–1.2)
Total Protein: 6.8 g/dL (ref 6.1–8.1)
eGFR: 42 mL/min/1.73m2 — ABNORMAL LOW (ref >=60)

## 2024-06-20 LAB — CBC
HCT: 36.5 % (ref 35.0–45.0)
Hemoglobin: 11.9 g/dL (ref 11.7–15.5)
MCH: 30.5 pg (ref 27.0–33.0)
MCHC: 32.6 g/dL (ref 32.0–36.0)
MCV: 93.6 fL (ref 80.0–100.0)
MPV: 10.4 fL (ref 7.5–12.5)
Platelets: 312 Thousand/uL (ref 140–400)
RBC: 3.9 Million/uL (ref 3.80–5.10)
RDW: 13.7 % (ref 11.0–15.0)
WBC: 9.9 Thousand/uL (ref 3.8–10.8)

## 2024-06-20 MED ORDER — COVID-19 MRNA VACC (MODERNA) 50 MCG/0.5ML IM SUSY: 0.5000 mL | PREFILLED_SYRINGE | INTRAMUSCULAR | 1 refills | Status: DC

## 2024-06-20 NOTE — Patient Instructions (Signed)
 1.) Visit your local pharmacy to receive your covid booster (prescription sent)

## 2024-06-20 NOTE — Progress Notes (Signed)
 Careteam: Patient Care Team: Sherlynn Madden, MD as PCP - General (Internal Medicine) Cindie Ole DASEN, MD as PCP - Electrophysiology (Cardiology) Ishmael Slough, MD as Consulting Physician (Rheumatology) Octavia Charleston, MD as Consulting Physician (Ophthalmology) Avram Lupita BRAVO, MD as Consulting Physician (Gastroenterology) Fate Morna SAILOR, [facility] (Inactive) as Pharmacist (Pharmacist) Gayland Lauraine PARAS, NP as Nurse Practitioner (Neurology) Leva Rush, MD as Consulting Physician (Obstetrics and Gynecology)  PLACE OF SERVICE:  [facility]  Advanced Directive information    Allergies  Allergen Reactions   Motrin  Markel ] Anaphylaxis   Percocet [Oxycodone -Acetaminophen ] Nausea Only   Zocor  [Simvastatin ] Other (See Comments)    Myalgias    2,4-D Dimethylamine Other (See Comments)    Unknown reaction  (weed killer)   Calan [Verapamil] Swelling   Cardizem [Diltiazem] Swelling    Angioedema    Hydrochlorothiazide Swelling   Valtrex  [Valacyclovir ] Other (See Comments)    Unknown reaction   Zestril [Lisinopril] Swelling    Chief Complaint  Patient presents with   Hospitalization Follow-up    Follow-up from recent hospital stay. Foot exam today. Request sent to eye doctor for results. Covid rx pended. Patient declined because she will get flu vaccine October 4th at her local pharmacy.     Discussed the use of AI scribe software for clinical note transcription with the patient, who gave verbal consent to proceed.  History of Present Illness Briana [name] is a 89 year old female with a history of stroke who presents with a recent seizure and right ankle pain. She is accompanied by her daughter.  She was recently hospitalized due to a seizure, initially thought to be related to a previous stroke but later attributed to a urinary tract infection. The seizure occurred while she was in the emergency room, and she has not experienced any seizures since being discharged.  She is currently taking Keppra  for seizure management.  She reports right ankle pain that developed during her hospital stay. The pain has improved, and she is able to walk, although she finds the prescribed boot heavy and prefers not to use it. She lives with her son and is able to perform weight-bearing activities.  She experiences nausea and has been prescribed Zofran , which she takes as needed. Her appetite is poor. No vomiting, abdominal pain, fever, dysuria, hematuria, or dizziness. She reports frequent vomiting but no pain when pressing on her abdomen.  Her sleep is occasionally disrupted, and she sometimes naps during the day. She is engaged in light exercise with two-pound weights for her arms. She uses a walker for stability at home.  She is currently taking amlodipine  10 mg and atenolol  as part of her medication regimen.    Recommendations for Outpatient Follow-up:  Needs Chem-12 CBC 1 week Recommend continued Keppra  500 twice daily for the foreseeable future given nidus of seizure being almost stroke--Follow-up with neurology please refer in the outpatient setting CAM Walker at all times for pain and discomfort with mobility secondary to ankle sprain Has underlyin refluex---ensure that she is on her PPI  Review of Systems:  Review of Systems  Constitutional:  Negative for chills and fever.  HENT:  Negative for congestion and sore throat.   Respiratory:  Negative for cough, sputum production and shortness of breath.   Cardiovascular:  Negative for chest pain, palpitations and leg swelling.  Gastrointestinal:  Positive for nausea. Negative for abdominal pain and heartburn.  Genitourinary:  Negative for dysuria, frequency and hematuria.  Neurological:  Negative for dizziness.  Negative unless indicated in HPI.   Past Medical History:  Diagnosis Date   ACUTE URIS OF UNSPECIFIED SITE 11/26/2008   ALLERGIC ARTHRITIS OTHER SPECIFIED SITES 10/22/2010   OA + RA   ALLERGIC  RHINITIS 10/28/2007   ARTHRITIS, KNEES, BILATERAL 10/28/2007   BACK PAIN 04/12/2009   BUNIONECTOMY, HX OF 10/28/2007   CELLULITIS&ABSCESS OF HAND EXCEPT FINGERS&THUMB 10/23/2010   Complication of anesthesia    itching and makes me crazy   Family history of anesthesia complication    DAUGHTER HAD HIVES AND  WENT WILD    HYPERLIPIDEMIA 10/28/2007   HYPERTENSION 10/28/2007   INSOMNIA UNSPECIFIED 10/31/2007   MYOSITIS 10/22/2010   NECK PAIN 12/03/2008   Pain in joint, site unspecified 10/07/2010   Stroke (HCC) 06/23/2023   WRIST PAIN, LEFT 10/22/2010   Past Surgical History:  Procedure Laterality Date   arthroscopic surgery and rotator cuff repair left shoulder     arthroscopy right knee hx of surgery     BUNIONECTOMY     hx of   FOOT SURGERY     RIGHT   hand surgery for broken finger, remote     JOINT REPLACEMENT Right    knee   TOTAL KNEE ARTHROPLASTY  07/01/2012   Procedure: TOTAL KNEE ARTHROPLASTY;  Surgeon: LELON JONETTA Shari Mickey., MD;  Location: [facility] OR;  Service: Orthopedics;  Laterality: Right;  right total knee arthroplasty   TOTAL KNEE ARTHROPLASTY Left 06/13/2014   dr beverley   TOTAL KNEE ARTHROPLASTY Left 06/13/2014   Procedure: TOTAL KNEE ARTHROPLASTY;  Surgeon: Toribio JULIANNA beverley, MD;  Location: [facility] OR;  Service: Orthopedics;  Laterality: Left;   Social History:   reports that she has never smoked. She has never used smokeless tobacco. She reports that she does not drink alcohol and does not use drugs.  Family History  Problem Relation Age of Onset   Heart attack Father    Ovarian cancer Sister    Lung cancer Brother    High blood pressure Daughter    High blood pressure Son    Breast cancer Son    High blood pressure Son    High blood pressure Son    High blood pressure Son    Coronary artery disease Other    Heart attack Other    Colon cancer Neg Hx    Endometrial cancer Neg Hx     Medications: Patient's Medications  New Prescriptions   No medications on file   Previous Medications   ACETAMINOPHEN  (TYLENOL ) 650 MG CR TABLET    Take 650 mg by mouth every 8 (eight) hours as needed for pain.   ACETAMINOPHEN -CODEINE  (TYLENOL  #3) 300-30 MG TABLET    Take 1-2 tablets by mouth every 4 (four) hours as needed for moderate pain (pain score 4-6).   AMLODIPINE  (NORVASC ) 10 MG TABLET    TAKE 1 TABLET EVERY DAY   ATENOLOL  (TENORMIN ) 50 MG TABLET    Take 1 tablet (50 mg total) by mouth daily.   ATORVASTATIN  (LIPITOR) 40 MG TABLET    Take 1 tablet (40 mg total) by mouth daily.   B COMPLEX VITAMINS CAPSULE    Take 1 capsule by mouth daily.   CHOLECALCIFEROL (VITAMIN D-3 PO)    Take 1 capsule by mouth daily.   CLOPIDOGREL  (PLAVIX ) 75 MG TABLET    Take 1 tablet (75 mg total) by mouth daily.   CYANOCOBALAMIN  (VITAMIN B-12 PO)    Take 1 tablet by mouth daily.   DORZOLAMIDE -TIMOLOL  (COSOPT ) 22.3-6.8 MG/ML  OPHTHALMIC SOLUTION    Place 1 drop into both eyes 2 (two) times daily.   HYDROXYCHLOROQUINE  (PLAQUENIL ) 200 MG TABLET    Take 200 mg by mouth 2 (two) times daily.   LEVETIRACETAM  (KEPPRA ) 500 MG TABLET    Take 1 tablet (500 mg total) by mouth 2 (two) times daily.   NETARSUDIL -LATANOPROST  (ROCKLATAN ) 0.02-0.005 % SOLN    Place 1 drop into both eyes at bedtime.   ONDANSETRON  (ZOFRAN -ODT) 4 MG DISINTEGRATING TABLET    Take 1 tablet (4 mg total) by mouth every 8 (eight) hours as needed for nausea or vomiting.   PANTOPRAZOLE  (PROTONIX ) 40 MG TABLET    TAKE 1 TABLET TWICE DAILY   ROPINIROLE  (REQUIP ) 0.25 MG TABLET    Take 1 tablet (0.25 mg total) by mouth at bedtime as needed (Restless legs).  Modified Medications   No medications on file  Discontinued Medications   No medications on file    Physical Exam: Vitals:   06/20/24 1429  BP: 128/68  Pulse: 60  Resp: 18  Temp: 97.8 F (36.6 C)  SpO2: 95%  Weight: 148 lb 12.8 oz (67.5 kg)  Height: 5' 3 (1.6 m)   Body mass index is 26.36 kg/m. BP Readings from Last 3 Encounters:  06/20/24 128/68  06/10/24 (!) 153/60   04/12/24 (!) 108/52   Wt Readings from Last 3 Encounters:  06/20/24 148 lb 12.8 oz (67.5 kg)  06/08/24 155 lb 6.8 oz (70.5 kg)  04/12/24 149 lb 12.8 oz (67.9 kg)    Physical Exam Constitutional:      Appearance: Normal appearance.  HENT:     Head: Normocephalic and atraumatic.  Cardiovascular:     Rate and Rhythm: Normal rate and regular rhythm.  Pulmonary:     Effort: Pulmonary effort is normal. No respiratory distress.     Breath sounds: Normal breath sounds. No wheezing.  Abdominal:     General: Bowel sounds are normal. There is no distension.     Tenderness: There is no abdominal tenderness. There is no guarding or rebound.     Comments:    Musculoskeletal:        General: No swelling.     Comments: Rt ankle- no redness, no swelling No tenderness on palpation.  Neurological:     Mental Status: She is alert. Mental status is at baseline.     Motor: No weakness.     Labs reviewed: Basic Metabolic Panel: Recent Labs    06/23/23 2156 09/15/23 1101 06/06/24 2136 06/07/24 0849 06/08/24 0441 06/09/24 0333  NA 140   < > 139 140 141 138  K 3.6   < > 4.0 2.9* 3.8 3.6  CL 104   < > 103 106 107 102  CO2 25   < > 18* 25 23 27   GLUCOSE 118*   < > 148* 89 106* 113*  BUN 7*   < > 19 15 9 8   CREATININE 0.84   < > 1.13* 0.97 0.79 0.88  CALCIUM  9.4   < > 10.2 9.1 9.0 8.9  MG 1.9  --  2.0 2.1 1.8  --   PHOS 3.5  --   --  4.7* 3.3  --   TSH 1.440  --   --   --   --   --    < > = values in this interval not displayed.   Liver Function Tests: Recent Labs    06/23/23 2156 09/15/23 1101 06/06/24 2136  AST 26 25  47*  ALT 25 30* 64*  ALKPHOS 114  --  149*  BILITOT 0.9 1.3* 0.9  PROT 8.0 7.5 8.2*  ALBUMIN 3.8  --  4.6   Recent Labs    06/23/23 2156 09/15/23 1101 06/07/24 0849  LIPASE 28 22 34  AMYLASE  --   --  68   No results for input(s): AMMONIA in the last 8760 hours. CBC: Recent Labs    09/19/23 1333 06/06/24 2136 06/07/24 0849 06/08/24 0441  06/09/24 0333  WBC 8.7 10.7* 10.9* 10.7* 9.7  NEUTROABS 6.5 7.7  --   --   --   HGB 13.4 13.4 11.4* 11.3* 11.5*  HCT 39.4 39.1 33.0* 33.0* 33.9*  MCV 92.1 89.7 88.5 88.9 88.7  PLT 199 205 189 176 186   Lipid Panel: Recent Labs    06/24/23 0726  CHOL 176  HDL 100  LDLCALC 66  TRIG 52  CHOLHDL 1.8   TSH: Recent Labs    06/23/23 2156  TSH 1.440   A1C: Lab Results  Component Value Date   HGBA1C 6.0 (H) 06/07/2024    Assessment and Plan Assessment & Plan   1. Hospital discharge follow-up (Primary)  Pt is here for hospital discharge follow up  Accompanied by her daughter Doing better No seizures since discharge Cont with keppra  follow up with neurology - Ambulatory referral to Neurology  2. Seizures (HCC)  Cont with keppra  - Ambulatory referral to Neurology  3. Nausea  Pt tolerating PO intake Took zofran  yesterday Denies abdominal pain, diarrhea Instructed patient to take protonix  twice daily Avoid spicy foods Will check labs - CBC (no diff) - Complete Metabolic Panel with eGFR  4. Acute right ankle pain  Ankle pain improving No swelling or redness noted  Onychomycosis Will refer to podiatry for toe nail clipping   Other orders - COVID-19 mRNA vaccine (SPIKEVAX) syringe; Inject 0.5 mLs into the muscle as directed.  Dispense: 0.5 mL; Refill: 1

## 2024-06-21 ENCOUNTER — Other Ambulatory Visit: Payer: Self-pay | Admitting: Sports Medicine

## 2024-06-21 ENCOUNTER — Ambulatory Visit: Payer: Self-pay | Admitting: Sports Medicine

## 2024-06-21 ENCOUNTER — Inpatient Hospital Stay: Admitting: Sports Medicine

## 2024-06-21 DIAGNOSIS — I4891 Unspecified atrial fibrillation: Secondary | ICD-10-CM | POA: Diagnosis not present

## 2024-06-21 DIAGNOSIS — G40409 Other generalized epilepsy and epileptic syndromes, not intractable, without status epilepticus: Secondary | ICD-10-CM | POA: Diagnosis not present

## 2024-06-21 DIAGNOSIS — R7989 Other specified abnormal findings of blood chemistry: Secondary | ICD-10-CM

## 2024-06-21 DIAGNOSIS — N1832 Chronic kidney disease, stage 3b: Secondary | ICD-10-CM | POA: Diagnosis not present

## 2024-06-21 DIAGNOSIS — S93401D Sprain of unspecified ligament of right ankle, subsequent encounter: Secondary | ICD-10-CM | POA: Diagnosis not present

## 2024-06-21 DIAGNOSIS — G4700 Insomnia, unspecified: Secondary | ICD-10-CM | POA: Diagnosis not present

## 2024-06-21 DIAGNOSIS — M069 Rheumatoid arthritis, unspecified: Secondary | ICD-10-CM | POA: Diagnosis not present

## 2024-06-21 DIAGNOSIS — E1122 Type 2 diabetes mellitus with diabetic chronic kidney disease: Secondary | ICD-10-CM | POA: Diagnosis not present

## 2024-06-21 DIAGNOSIS — H409 Unspecified glaucoma: Secondary | ICD-10-CM | POA: Diagnosis not present

## 2024-06-21 DIAGNOSIS — I129 Hypertensive chronic kidney disease with stage 1 through stage 4 chronic kidney disease, or unspecified chronic kidney disease: Secondary | ICD-10-CM | POA: Diagnosis not present

## 2024-06-22 ENCOUNTER — Telehealth: Payer: Self-pay

## 2024-06-22 NOTE — Telephone Encounter (Signed)
 Call returned to [facility] and I left a detailed message with verbal orders authorization per [employer] standing order

## 2024-06-22 NOTE — Telephone Encounter (Signed)
 Copied from CRM #[id number]. Topic: Clinical - Home Health Verbal Orders >> Jun 22, 2024  1:50 PM Mercer PEDLAR wrote: [facility]  Callback Number: [phone] Service Requested: Skilled Nursing Frequency: weekly x 4 Bi-weekly x 2  Any new concerns about the patient? No

## 2024-06-23 ENCOUNTER — Ambulatory Visit
Admission: RE | Admit: 2024-06-23 | Discharge: 2024-06-23 | Disposition: A | Discharge status: Home / Self Care | Source: Ambulatory Visit | Attending: Sports Medicine | Admitting: Sports Medicine

## 2024-06-23 DIAGNOSIS — R7401 Elevation of levels of liver transaminase levels: Secondary | ICD-10-CM | POA: Diagnosis not present

## 2024-06-23 DIAGNOSIS — R7989 Other specified abnormal findings of blood chemistry: Secondary | ICD-10-CM

## 2024-06-27 ENCOUNTER — Telehealth: Payer: Self-pay

## 2024-06-27 NOTE — Telephone Encounter (Signed)
 Copied from CRM #[id number]. Topic: Clinical - Home Health Verbal Orders >> Jun 27, 2024  9:22 AM Cherylann RAMAN wrote: Caller/Agency: [facility] Callback Number: [phone] VM is confidential may leave detailed messages Service Requested: Physical Therapy Frequency: 1 week 6 Any new concerns about the patient? No

## 2024-06-27 NOTE — Telephone Encounter (Signed)
 Call returned to [facility] and verbal orders authorized per [employer] standing order

## 2024-06-28 ENCOUNTER — Ambulatory Visit

## 2024-06-28 DIAGNOSIS — B351 Tinea unguium: Secondary | ICD-10-CM | POA: Diagnosis not present

## 2024-06-28 DIAGNOSIS — E118 Type 2 diabetes mellitus with unspecified complications: Secondary | ICD-10-CM

## 2024-06-28 DIAGNOSIS — N1832 Chronic kidney disease, stage 3b: Secondary | ICD-10-CM | POA: Diagnosis not present

## 2024-06-28 NOTE — Progress Notes (Signed)
 Subjective:  Patient ID: Briana [name], female    DOB: 1931-09-14,  MRN: [id number]  89 y.o. female presents with painful toenails 1 through 5 bilaterally.  She and her daughter are concerned about a developing nail fungus and possible treatments.  She is a diabetic without neuropathy, has CKD. She has not previously tried professional debridement or other treatment.   Chief Complaint  Patient presents with   Nail Problem    Toenails bilateral (mainly hallux) - concern for fungus, thick and discolored for years, hard to cut   New Patient (Initial Visit)      PCP is Sherlynn Madden, MD   Allergies  Allergen Reactions   Motrin  [Ibuprofen ] Anaphylaxis   Percocet [Oxycodone -Acetaminophen ] Nausea Only   Zocor  [Simvastatin ] Other (See Comments)    Myalgias    2,4-D Dimethylamine Other (See Comments)    Unknown reaction  (weed killer)   Calan [Verapamil] Swelling   Cardizem [Diltiazem] Swelling    Angioedema    Hydrochlorothiazide Swelling   Valtrex  [Valacyclovir ] Other (See Comments)    Unknown reaction   Zestril [Lisinopril] Swelling    Review of Systems: Negative except as noted in the HPI.   Objective:  Briana [name] is a pleasant 89 y.o. female in NAD. AAO x 3. AAO x 3.  Vascular Examination: DP and PT faintly palpable pedal pulses. CFT immediate b/l. Pedal hair absent. No edema. No pain with calf compression b/l. Skin temperature gradient WNL b/l. No varicosities noted. No cyanosis or clubbing noted.  Neurological Examination: Sensation grossly intact b/l with 10 gram monofilament. Negative tinel sign at tarsal tunnel bilaterally.   Dermatological Examination: Pedal skin with normal turgor, texture and tone b/l. No open wounds nor interdigital macerations noted. Toenails 1-5 b/l thick, discolored, elongated with subungual debris and pain on dorsal palpation. No hyperkeratotic lesions noted b/l.   Musculoskeletal Examination: Muscle strength 5/5 to b/l LE.   No pain, crepitus noted b/l. Pes planus foot shape without pain at PT, STJ, TN. Patient ambulates independently with walker as aid.   Radiographs: None Last A1c:      Latest Ref Rng & Units 06/07/2024    8:49 AM  Hemoglobin A1C  Hemoglobin-A1c 4.8 - 5.6 % 6.0      Assessment:  No diagnosis found.  Plan:  Consent given for treatment. Patient examined. All patient's and/or POA's questions/concerns addressed on today's visit. Toenails 1-5 debrided in length and girth without incident. Continue soft, supportive shoe gear daily. Report any pedal injuries to medical professional. Call office if there are any questions/concerns. -Patient/POA to call should there be question/concern in the interim. - We did discuss use of oral versus topical antifungals.  Patient does have some issues with her liver and therefore will not move forward with terbinafine.  We did discuss potential use of Penlac, but patient does not wish to have to apply daily. Discussed symptom relief care with an appointment in 3 months. She would like to proceed with this.   Follow-up with Dr. Loreda or Dr. Nettie in 3 months.   Prentice Ovens, DPM AACFAS Fellowship Trained Podiatric Surgeon [facility]       [place] LOCATION: [address]                                                 [place],  [place] [address]                   Office [phone]   [facility] LOCATION: [address] [place], [place] [address] Office [phone]

## 2024-06-29 ENCOUNTER — Other Ambulatory Visit: Payer: Self-pay | Admitting: Sports Medicine

## 2024-06-29 DIAGNOSIS — I6300 Cerebral infarction due to thrombosis of unspecified precerebral artery: Secondary | ICD-10-CM

## 2024-06-29 DIAGNOSIS — E785 Hyperlipidemia, unspecified: Secondary | ICD-10-CM

## 2024-06-29 NOTE — Telephone Encounter (Signed)
 Patient medication has warnings. Pend and sent to PCP Sherlynn Madden, MD

## 2024-06-30 ENCOUNTER — Ambulatory Visit: Payer: Self-pay | Admitting: Sports Medicine

## 2024-07-03 DIAGNOSIS — M069 Rheumatoid arthritis, unspecified: Secondary | ICD-10-CM | POA: Diagnosis not present

## 2024-07-03 DIAGNOSIS — S93401D Sprain of unspecified ligament of right ankle, subsequent encounter: Secondary | ICD-10-CM | POA: Diagnosis not present

## 2024-07-03 DIAGNOSIS — E1122 Type 2 diabetes mellitus with diabetic chronic kidney disease: Secondary | ICD-10-CM | POA: Diagnosis not present

## 2024-07-03 DIAGNOSIS — G4700 Insomnia, unspecified: Secondary | ICD-10-CM | POA: Diagnosis not present

## 2024-07-03 DIAGNOSIS — I129 Hypertensive chronic kidney disease with stage 1 through stage 4 chronic kidney disease, or unspecified chronic kidney disease: Secondary | ICD-10-CM | POA: Diagnosis not present

## 2024-07-03 DIAGNOSIS — G40409 Other generalized epilepsy and epileptic syndromes, not intractable, without status epilepticus: Secondary | ICD-10-CM | POA: Diagnosis not present

## 2024-07-03 DIAGNOSIS — N1832 Chronic kidney disease, stage 3b: Secondary | ICD-10-CM | POA: Diagnosis not present

## 2024-07-03 DIAGNOSIS — I4891 Unspecified atrial fibrillation: Secondary | ICD-10-CM | POA: Diagnosis not present

## 2024-07-03 DIAGNOSIS — H409 Unspecified glaucoma: Secondary | ICD-10-CM | POA: Diagnosis not present

## 2024-07-05 DIAGNOSIS — G40409 Other generalized epilepsy and epileptic syndromes, not intractable, without status epilepticus: Secondary | ICD-10-CM | POA: Diagnosis not present

## 2024-07-05 DIAGNOSIS — E1122 Type 2 diabetes mellitus with diabetic chronic kidney disease: Secondary | ICD-10-CM | POA: Diagnosis not present

## 2024-07-05 DIAGNOSIS — N1832 Chronic kidney disease, stage 3b: Secondary | ICD-10-CM | POA: Diagnosis not present

## 2024-07-05 DIAGNOSIS — I4891 Unspecified atrial fibrillation: Secondary | ICD-10-CM | POA: Diagnosis not present

## 2024-07-05 DIAGNOSIS — H409 Unspecified glaucoma: Secondary | ICD-10-CM | POA: Diagnosis not present

## 2024-07-05 DIAGNOSIS — M069 Rheumatoid arthritis, unspecified: Secondary | ICD-10-CM | POA: Diagnosis not present

## 2024-07-05 DIAGNOSIS — I129 Hypertensive chronic kidney disease with stage 1 through stage 4 chronic kidney disease, or unspecified chronic kidney disease: Secondary | ICD-10-CM | POA: Diagnosis not present

## 2024-07-05 DIAGNOSIS — G4700 Insomnia, unspecified: Secondary | ICD-10-CM | POA: Diagnosis not present

## 2024-07-05 DIAGNOSIS — S93401D Sprain of unspecified ligament of right ankle, subsequent encounter: Secondary | ICD-10-CM | POA: Diagnosis not present

## 2024-07-14 DIAGNOSIS — M069 Rheumatoid arthritis, unspecified: Secondary | ICD-10-CM | POA: Diagnosis not present

## 2024-07-19 DIAGNOSIS — J302 Other seasonal allergic rhinitis: Secondary | ICD-10-CM | POA: Diagnosis not present

## 2024-07-19 DIAGNOSIS — Z8673 Personal history of transient ischemic attack (TIA), and cerebral infarction without residual deficits: Secondary | ICD-10-CM

## 2024-07-19 DIAGNOSIS — M069 Rheumatoid arthritis, unspecified: Secondary | ICD-10-CM | POA: Diagnosis not present

## 2024-07-19 DIAGNOSIS — Z96653 Presence of artificial knee joint, bilateral: Secondary | ICD-10-CM

## 2024-07-19 DIAGNOSIS — H8113 Benign paroxysmal vertigo, bilateral: Secondary | ICD-10-CM

## 2024-07-19 DIAGNOSIS — I129 Hypertensive chronic kidney disease with stage 1 through stage 4 chronic kidney disease, or unspecified chronic kidney disease: Secondary | ICD-10-CM | POA: Diagnosis not present

## 2024-07-19 DIAGNOSIS — E1122 Type 2 diabetes mellitus with diabetic chronic kidney disease: Secondary | ICD-10-CM | POA: Diagnosis not present

## 2024-07-19 DIAGNOSIS — E785 Hyperlipidemia, unspecified: Secondary | ICD-10-CM | POA: Diagnosis not present

## 2024-07-19 DIAGNOSIS — S93401D Sprain of unspecified ligament of right ankle, subsequent encounter: Secondary | ICD-10-CM | POA: Diagnosis not present

## 2024-07-19 DIAGNOSIS — G4700 Insomnia, unspecified: Secondary | ICD-10-CM | POA: Diagnosis not present

## 2024-07-19 DIAGNOSIS — Z87440 Personal history of urinary (tract) infections: Secondary | ICD-10-CM | POA: Diagnosis not present

## 2024-07-19 DIAGNOSIS — G40409 Other generalized epilepsy and epileptic syndromes, not intractable, without status epilepticus: Secondary | ICD-10-CM | POA: Diagnosis not present

## 2024-07-21 DIAGNOSIS — M069 Rheumatoid arthritis, unspecified: Secondary | ICD-10-CM | POA: Diagnosis not present

## 2024-07-21 DIAGNOSIS — I4891 Unspecified atrial fibrillation: Secondary | ICD-10-CM | POA: Diagnosis not present

## 2024-07-21 DIAGNOSIS — G40409 Other generalized epilepsy and epileptic syndromes, not intractable, without status epilepticus: Secondary | ICD-10-CM | POA: Diagnosis not present

## 2024-07-21 DIAGNOSIS — E1122 Type 2 diabetes mellitus with diabetic chronic kidney disease: Secondary | ICD-10-CM | POA: Diagnosis not present

## 2024-07-21 DIAGNOSIS — G4700 Insomnia, unspecified: Secondary | ICD-10-CM | POA: Diagnosis not present

## 2024-07-21 DIAGNOSIS — H409 Unspecified glaucoma: Secondary | ICD-10-CM | POA: Diagnosis not present

## 2024-07-21 DIAGNOSIS — N1832 Chronic kidney disease, stage 3b: Secondary | ICD-10-CM | POA: Diagnosis not present

## 2024-07-21 DIAGNOSIS — I129 Hypertensive chronic kidney disease with stage 1 through stage 4 chronic kidney disease, or unspecified chronic kidney disease: Secondary | ICD-10-CM | POA: Diagnosis not present

## 2024-07-21 DIAGNOSIS — S93401D Sprain of unspecified ligament of right ankle, subsequent encounter: Secondary | ICD-10-CM | POA: Diagnosis not present

## 2024-07-24 DIAGNOSIS — I129 Hypertensive chronic kidney disease with stage 1 through stage 4 chronic kidney disease, or unspecified chronic kidney disease: Secondary | ICD-10-CM | POA: Diagnosis not present

## 2024-07-24 DIAGNOSIS — S93401D Sprain of unspecified ligament of right ankle, subsequent encounter: Secondary | ICD-10-CM | POA: Diagnosis not present

## 2024-07-24 DIAGNOSIS — N1832 Chronic kidney disease, stage 3b: Secondary | ICD-10-CM | POA: Diagnosis not present

## 2024-07-24 DIAGNOSIS — H409 Unspecified glaucoma: Secondary | ICD-10-CM | POA: Diagnosis not present

## 2024-07-24 DIAGNOSIS — I4891 Unspecified atrial fibrillation: Secondary | ICD-10-CM | POA: Diagnosis not present

## 2024-07-24 DIAGNOSIS — E1122 Type 2 diabetes mellitus with diabetic chronic kidney disease: Secondary | ICD-10-CM | POA: Diagnosis not present

## 2024-07-24 DIAGNOSIS — M069 Rheumatoid arthritis, unspecified: Secondary | ICD-10-CM | POA: Diagnosis not present

## 2024-07-24 DIAGNOSIS — G40409 Other generalized epilepsy and epileptic syndromes, not intractable, without status epilepticus: Secondary | ICD-10-CM | POA: Diagnosis not present

## 2024-07-24 DIAGNOSIS — G4700 Insomnia, unspecified: Secondary | ICD-10-CM | POA: Diagnosis not present

## 2024-07-25 ENCOUNTER — Encounter: Payer: Self-pay | Admitting: Neurology

## 2024-07-25 ENCOUNTER — Ambulatory Visit: Admitting: Neurology

## 2024-07-25 VITALS — BP 137/84 | HR 61 | Ht 64.0 in | Wt 150.5 lb

## 2024-07-25 DIAGNOSIS — G40909 Epilepsy, unspecified, not intractable, without status epilepticus: Secondary | ICD-10-CM

## 2024-07-25 DIAGNOSIS — I639 Cerebral infarction, unspecified: Secondary | ICD-10-CM

## 2024-07-25 NOTE — Patient Instructions (Addendum)
 Will check a Keppra  level  Continue your other medications  Continue to follow up with PCP  Return in 6 to 8 months or sooner if worse

## 2024-07-25 NOTE — Progress Notes (Unsigned)
 [facility]  PATIENT: Briana [name] DOB: [birth year]  REQUESTING CLINICIAN: Sherlynn Madden, * HISTORY FROM: Patient/Daughter/Chart review  REASON FOR VISIT: Seizure    HISTORICAL  CHIEF COMPLAINT:  Chief Complaint  Patient presents with   RM13/SEIZURES    Pt is here with her Daughter. Pt states that she has been doing pretty good since her seizure. Pt states that she has nausea sometimes.     HISTORY OF PRESENT ILLNESS:  Discussed the use of AI scribe software for clinical note transcription with the patient, who gave verbal consent to proceed.  Briana [name] is a 89 year old female with a history of stroke who presents with seizures.  She experienced a seizure while in the emergency room on 06/06/2024 after initially feeling unwell with symptoms that began in her stomach and progressed to her left leg and back. During the seizure, she exhibited minor shaking, eye rolling, and frothing at the mouth. She was diagnosed with a seizure and started on Keppra  (levetiracetam ).  While in the hospital, her EEG showed right posterior quadrant slowing and MRI showed sequela from the old stroke no new stroke.  Since starting the medication, she has not experienced further seizures.  She has a history of a stroke on the right hemisphere, which occurred approximately a year ago. She has been taking Keppra  500 mg and reports no new side effects since starting the medication, although she experiences nausea and a lack of appetite, which were present prior to starting Keppra . Her family notes that she appears sleepy, although she does not feel excessively sleepy herself.  Her past medical history includes a previous stroke, and she is currently on Plavix , Lipitor, and blood pressure medication. She follows a gluten-free diet and is managing her cholesterol with atorvastatin .      OTHER MEDICAL CONDITIONS: History of stroke, hypertension, hyperlipidemia  REVIEW OF  SYSTEMS: Full 14 system review of systems performed and negative with exception of: As noted in the HPI   ALLERGIES: Allergies  Allergen Reactions   Aspirin  Anaphylaxis, Shortness Of Breath and Swelling   Motrin  [Ibuprofen ] Anaphylaxis   Percocet [Oxycodone -Acetaminophen ] Nausea Only   Zocor  [Simvastatin ] Other (See Comments)    Myalgias    2,4-D Dimethylamine Other (See Comments)    Unknown reaction  (weed killer)   Calan [Verapamil] Swelling   Cardizem [Diltiazem] Swelling    Angioedema    Hydrochlorothiazide Swelling   Valtrex  [Valacyclovir ] Other (See Comments)    Unknown reaction   Zestril [Lisinopril] Swelling    HOME MEDICATIONS: Outpatient Medications Prior to Visit  Medication Sig Dispense Refill   acetaminophen  (TYLENOL ) 650 MG CR tablet Take 650 mg by mouth every 8 (eight) hours as needed for pain.     acetaminophen -codeine  (TYLENOL  #3) 300-30 MG tablet Take 1-2 tablets by mouth every 4 (four) hours as needed for moderate pain (pain score 4-6). 30 tablet 0   amLODipine  (NORVASC ) 10 MG tablet TAKE 1 TABLET EVERY DAY 90 tablet 1   atenolol  (TENORMIN ) 50 MG tablet Take 1 tablet (50 mg total) by mouth daily. 90 tablet 3   atorvastatin  (LIPITOR) 40 MG tablet Take 1 tablet (40 mg total) by mouth at bedtime. 90 tablet 3   b complex vitamins capsule Take 1 capsule by mouth daily.     Cholecalciferol (VITAMIN D-3 PO) Take 1 capsule by mouth daily.     clopidogrel  (PLAVIX ) 75 MG tablet Take 1 tablet (75 mg total) by mouth daily. 90 tablet 3  COVID-19 mRNA vaccine (SPIKEVAX) syringe Inject 0.5 mLs into the muscle as directed. 0.5 mL 1   dorzolamide -timolol  (COSOPT ) 22.3-6.8 MG/ML ophthalmic solution Place 1 drop into both eyes 2 (two) times daily.     hydroxychloroquine  (PLAQUENIL ) 200 MG tablet Take 200 mg by mouth 2 (two) times daily.     levETIRAcetam  (KEPPRA ) 500 MG tablet Take 1 tablet (500 mg total) by mouth 2 (two) times daily. 60 tablet 11   Netarsudil -Latanoprost   (ROCKLATAN ) 0.02-0.005 % SOLN Place 1 drop into both eyes at bedtime.     ondansetron  (ZOFRAN -ODT) 4 MG disintegrating tablet Take 1 tablet (4 mg total) by mouth every 8 (eight) hours as needed for nausea or vomiting. 20 tablet 0   pantoprazole  (PROTONIX ) 40 MG tablet TAKE 1 TABLET TWICE DAILY 180 tablet 1   rOPINIRole  (REQUIP ) 0.25 MG tablet Take 1 tablet (0.25 mg total) by mouth at bedtime as needed (Restless legs). 30 tablet 11   Cyanocobalamin  (VITAMIN B-12 PO) Take 1 tablet by mouth daily. (Patient not taking: Reported on 07/25/2024)     No facility-administered medications prior to visit.    PAST MEDICAL HISTORY: Past Medical History:  Diagnosis Date   ACUTE URIS OF UNSPECIFIED SITE 11/26/2008   ALLERGIC ARTHRITIS OTHER SPECIFIED SITES 10/22/2010   OA + RA   ALLERGIC RHINITIS 10/28/2007   ARTHRITIS, KNEES, BILATERAL 10/28/2007   BACK PAIN 04/12/2009   BUNIONECTOMY, HX OF 10/28/2007   CELLULITIS&ABSCESS OF HAND EXCEPT FINGERS&THUMB 10/23/2010   Complication of anesthesia    itching and makes me crazy   Family history of anesthesia complication    DAUGHTER HAD HIVES AND  WENT WILD    HYPERLIPIDEMIA 10/28/2007   HYPERTENSION 10/28/2007   INSOMNIA UNSPECIFIED 10/31/2007   MYOSITIS 10/22/2010   NECK PAIN 12/03/2008   Pain in joint, site unspecified 10/07/2010   Stroke (HCC) 06/23/2023   WRIST PAIN, LEFT 10/22/2010    PAST SURGICAL HISTORY: Past Surgical History:  Procedure Laterality Date   arthroscopic surgery and rotator cuff repair left shoulder     arthroscopy right knee hx of surgery     BUNIONECTOMY     hx of   FOOT SURGERY     RIGHT   hand surgery for broken finger, remote     JOINT REPLACEMENT Right    knee   TOTAL KNEE ARTHROPLASTY  07/01/2012   Procedure: TOTAL KNEE ARTHROPLASTY;  Surgeon: LELON JONETTA Shari Mickey., MD;  Location: [facility] OR;  Service: Orthopedics;  Laterality: Right;  right total knee arthroplasty   TOTAL KNEE ARTHROPLASTY Left 06/13/2014   dr beverley    TOTAL KNEE ARTHROPLASTY Left 06/13/2014   Procedure: TOTAL KNEE ARTHROPLASTY;  Surgeon: Toribio JULIANNA beverley, MD;  Location[facility];  Service: Orthopedics;  Laterality: Left;    FAMILY HISTORY: Family History  Problem Relation Age of Onset   Heart attack Father    Ovarian cancer Sister    Lung cancer Brother    High blood pressure Daughter    High blood pressure Son    Breast cancer Son    High blood pressure Son    High blood pressure Son    High blood pressure Son    Coronary artery disease Other    Heart attack Other    Colon cancer Neg Hx    Endometrial cancer Neg Hx     SOCIAL HISTORY: Social History   Socioeconomic History   Marital status: Widowed    Spouse name: Not on file   Number of  children: 4   Years of education: 12   Highest education level: Not on file  Occupational History   Occupation: ASSN II 1    Employer: [employer]  Tobacco Use   Smoking status: Never   Smokeless tobacco: Never  Vaping Use   Vaping status: Never Used  Substance and Sexual Activity   Alcohol use: No   Drug use: No   Sexual activity: Not Currently  Other Topics Concern   Not on file  Social History Narrative   Married in 1968 widowed on 004 sons, 1 daughter, 6 grandchildren, 3 great-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         Per [facility] [place] Packet Abstracted on 08/10/2023:      Diet: Blank      Caffeine: None      Married, if yes what year:  44      Do you live in a house, apartment, assisted living, condo, trailer, ect: House      Is it one or more stories: One      How many persons live in your home? 2      Pets: No pets      Highest level or education completed:  11th grade      Current/Past profession: Psychiatric nurse      Exercise:       yes           Type and how often: standard          Living Will: No   DNR: no   POA/HPOA: no      Functional Status:   Do you have difficulty  bathing or dressing yourself? no   Do you have difficulty preparing food or eating?Yes   Do you have difficulty managing your medications?Yes   Do you have difficulty managing your finances? ?   Do you have difficulty affording your medications? No      Social Drivers of Corporate investment banker Strain: Low Risk  (12/10/2022)   Overall Financial Resource Strain (CARDIA)    Difficulty of Paying Living Expenses: Not hard at all  Food Insecurity: Unknown (06/07/2024)   Hunger Vital Sign    Worried About Running Out of Food in the Last Year: Not on file    Ran Out of Food in the Last Year: Patient unable to answer  Transportation Needs: No Transportation Needs (06/29/2023)   PRAPARE - Administrator, Civil Service (Medical): No    Lack of Transportation (Non-Medical): No  Physical Activity: Insufficiently Active (12/10/2022)   Exercise Vital Sign    Days of Exercise per Week: 7 days    Minutes of Exercise per Session: 10 min  Stress: No Stress Concern Present (12/10/2022)   [employer] of Occupational Health - Occupational Stress Questionnaire    Feeling of Stress : Only a little  Social Connections: Moderately Integrated (12/10/2022)   Social Connection and Isolation Panel    Frequency of Communication with Friends and Family: More than three times a week    Frequency of Social Gatherings with Friends and Family: More than three times a week    Attends Religious Services: More than 4 times per year    Active Member of [employer] or Organizations: Yes    Attends Banker Meetings: More than 4 times per year    Marital Status: Widowed  Intimate Partner Violence: Not At  Risk (06/24/2023)   Humiliation, Afraid, Rape, and Kick questionnaire    Fear of Current or Ex-Partner: No    Emotionally Abused: No    Physically Abused: No    Sexually Abused: No    PHYSICAL EXAM  GENERAL EXAM/CONSTITUTIONAL: Vitals:  Vitals:   07/25/24 1034  BP: 137/84  Pulse: 61   SpO2: 99%  Weight: 150 lb 8 oz (68.3 kg)  Height: 5' 4 (1.626 m)   Body mass index is 25.83 kg/m. Wt Readings from Last 3 Encounters:  07/25/24 150 lb 8 oz (68.3 kg)  06/20/24 148 lb 12.8 oz (67.5 kg)  06/08/24 155 lb 6.8 oz (70.5 kg)   Patient is in no distress; well developed, nourished and groomed; neck is supple  MUSCULOSKELETAL: Gait, strength, tone, movements noted in Neurologic exam below  NEUROLOGIC: MENTAL STATUS:     11/09/2023   11:20 AM 06/20/2019    2:16 PM 06/02/2018    9:30 AM  MMSE - Mini Mental State Exam  Not completed:  Refused   Orientation to time 4  5  Orientation to Place 4  5  Registration 3  3  Attention/ Calculation 2  3  Recall 2  3  Language- name 2 objects 2  2  Language- repeat 1  1  Language- follow 3 step command 3  3  Language- read & follow direction 1  1  Write a sentence 1  1  Copy design 0  1  Total score 23  28   awake, alert, oriented to person, place and time Able to provide history language fluent, comprehension intact, naming intact fund of knowledge appropriate  CRANIAL NERVE:  2nd, 3rd, 4th, 6th - Visual fields full to confrontation, extraocular muscles intact, no nystagmus 5th - facial sensation symmetric 7th - facial strength symmetric 8th - hearing intact 9th - palate elevates symmetrically, uvula midline 11th - shoulder shrug symmetric 12th - tongue protrusion midline  MOTOR:  normal bulk and tone, full strength in the BUE, BLE  SENSORY:  normal and symmetric to light touch  COORDINATION:  finger-nose-finger, fine finger movements normal  GAIT/STATION:  Deferred, using a wheelchair    DIAGNOSTIC DATA (LABS, IMAGING, TESTING) - I reviewed patient records, labs, notes, testing and imaging myself where available.  Lab Results  Component Value Date   WBC 9.9 06/20/2024   HGB 11.9 06/20/2024   HCT 36.5 06/20/2024   MCV 93.6 06/20/2024   PLT 312 06/20/2024      Component Value Date/Time   NA 142  06/20/2024 1513   NA 144 04/22/2017 0000   K 3.6 06/20/2024 1513   CL 104 06/20/2024 1513   CO2 31 06/20/2024 1513   GLUCOSE 120 (H) 06/20/2024 1513   BUN 14 06/20/2024 1513   BUN 20 04/22/2017 0000   CREATININE 1.22 (H) 06/20/2024 1513   CALCIUM  9.3 06/20/2024 1513   PROT 6.8 06/20/2024 1513   ALBUMIN 4.6 06/06/2024 2136   AST 60 (H) 06/20/2024 1513   ALT 95 (H) 06/20/2024 1513   ALKPHOS 149 (H) 06/06/2024 2136   BILITOT 0.5 06/20/2024 1513   GFRNONAA >60 06/09/2024 0333   GFRNONAA 54 (L) 05/29/2020 1043   GFRAA 63 05/29/2020 1043   Lab Results  Component Value Date   CHOL 176 06/24/2023   HDL 100 06/24/2023   LDLCALC 66 06/24/2023   LDLDIRECT 104.5 04/25/2010   TRIG 52 06/24/2023   Lab Results  Component Value Date   HGBA1C 6.0 (H) 06/07/2024  Lab Results  Component Value Date   VITAMINB12 577 07/06/2014   Lab Results  Component Value Date   TSH 1.440 06/23/2023   Routine EEG 06/07/2024 - Continuous slow, right posterior quadrant.   MRI Brain 06/07/2024 1. Large old right parietal infarct with encephalomalacia 2. Old bilateral cerebellar infarcts 3. No acute abnormality. No change compared with June 23, 2023   ASSESSMENT AND PLAN  89 y.o. year old female  with history of stroke, hypertension, hyperlipidemia, who is presenting after a seizure on August 27.  Seizure etiology likely from her large cortical right hemispheric stroke.  The EEG showed right posterior quadrant slowing.  She is on Keppra  500 mg twice daily, doing well.  Plan will be to continue patient on Keppra  500 mg twice daily and she understands to contact us  if she does have another seizure.   Seizure disorder likely secondary to right cortical stroke Seizure disorder attributed to a previous right cortical stroke, likely due to scar tissue formation. She experienced a seizure with minor shaking, eye deviation, and altered consciousness. Currently on levetiracetam  (Keppra ) with no further  seizures reported. The medication is well-tolerated, with potential side effect of sleepiness, which she denies. Safe for kidneys, but blood levels will be monitored. - Continue levetiracetam  (Keppra ) 500 mg - Order blood work to check levetiracetam  levels - Monitor for side effects such as sleepiness  Right cortical cerebral infarction Right cortical cerebral infarction has resulted in seizure disorder. The cortical stroke on the right  hemisphere affected the left side of the body. She is on Plavix , Lipitor, and blood pressure medication to manage risk factors and prevent further strokes. History of two strokes in the same area, with ongoing risk of future strokes. Management focuses on controlling hypertension and hyperlipidemia. - Continue Plavix  for stroke prevention - Continue Lipitor for cholesterol management - Continue blood pressure medication - Advise on dietary modifications to reduce stroke risk, including avoiding fried foods and consuming a balanced diet with plenty of vegetables and grains  Hypertension Hypertension is a risk factor for stroke and is managed with medication. Blood pressure control is crucial to prevent further cerebrovascular events. - Continue blood pressure medication - Advise on dietary modifications to support blood pressure control, including reducing salt intake  Hyperlipidemia Hyperlipidemia is managed with atorvastatin  (Lipitor) to reduce stroke risk. She is advised to maintain a healthy diet to support cholesterol management. - Continue atorvastatin  (Lipitor) - Advise on dietary modifications to support cholesterol management, including reducing intake of fatty foods and increasing consumption of vegetables and grains    1. Cerebrovascular accident (CVA), unspecified mechanism (HCC)   2. Seizure disorder [facility])     Patient Instructions  Continue with Keppra  500 mg twice daily  Will check a Keppra  level  Continue your other medications  Continue  to follow up with PCP  Return in 6 to 8 months or sooner if worse    Per [place]  DMV statutes, patients with seizures are not allowed to drive until they have been seizure-free for six months.  Other recommendations include using caution when using heavy equipment or power tools. Avoid working on ladders or at heights. Take showers instead of baths.  Do not swim alone.  Ensure the water temperature is not too high on the home water heater. Do not go swimming alone. Do not lock yourself in a room alone (i.e. bathroom). When caring for infants or small children, sit down when holding, feeding, or changing them to minimize risk of  injury to the child in the event you have a seizure. Maintain good sleep hygiene. Avoid alcohol.  Also recommend adequate sleep, hydration, good diet and minimize stress.   During the Seizure  - First, ensure adequate ventilation and place patients on the floor on their left side  Loosen clothing around the neck and ensure the airway is patent. If the patient is clenching the teeth, do not force the mouth open with any object as this can cause severe damage - Remove all items from the surrounding that can be hazardous. The patient may be oblivious to what's happening and may not even know what he or she is doing. If the patient is confused and wandering, either gently guide him/her away and block access to outside areas - Reassure the individual and be comforting - Call 911. In most cases, the seizure ends before EMS arrives. However, there are cases when seizures may last over 3 to 5 minutes. Or the individual may have developed breathing difficulties or severe injuries. If a pregnant patient or a person with diabetes develops a seizure, it is prudent to call an ambulance. - Finally, if the patient does not regain full consciousness, then call EMS. Most patients will remain confused for about 45 to 90 minutes after a seizure, so you must use judgment in calling for  help. - Avoid restraints but make sure the patient is in a bed with padded side rails - Place the individual in a lateral position with the neck slightly flexed; this will help the saliva drain from the mouth and prevent the tongue from falling backward - Remove all nearby furniture and other hazards from the area - Provide verbal assurance as the individual is regaining consciousness - Provide the patient with privacy if possible - Call for help and start treatment as ordered by the caregiver   After the Seizure (Postictal Stage)  After a seizure, most patients experience confusion, fatigue, muscle pain and/or a headache. Thus, one should permit the individual to sleep. For the next few days, reassurance is essential. Being calm and helping reorient the person is also of importance.  Most seizures are painless and end spontaneously. Seizures are not harmful to others but can lead to complications such as stress on the lungs, brain and the heart. Individuals with prior lung problems may develop labored breathing and respiratory distress.    Discussed Patients with epilepsy have a small risk of sudden unexpected death, a condition referred to as sudden unexpected death in epilepsy (SUDEP). SUDEP is defined specifically as the sudden, unexpected, witnessed or unwitnessed, nontraumatic and nondrowning death in patients with epilepsy with or without evidence for a seizure, and excluding documented status epilepticus, in which post mortem examination does not reveal a structural or toxicologic cause for death     Orders Placed This Encounter  Procedures   Levetiracetam  level    No orders of the defined types were placed in this encounter.   Return in about 6 months (around 01/23/2025).    Pastor Falling, MD 07/26/2024, 5:24 PM  [facility] [address], [address] [place], [place] [address] [phone]

## 2024-07-26 LAB — LEVETIRACETAM LEVEL: Levetiracetam Lvl: 24.7 ug/mL (ref 10.0–40.0)

## 2024-07-27 ENCOUNTER — Ambulatory Visit: Payer: Self-pay | Admitting: Neurology

## 2024-07-31 DIAGNOSIS — I4891 Unspecified atrial fibrillation: Secondary | ICD-10-CM | POA: Diagnosis not present

## 2024-08-02 ENCOUNTER — Other Ambulatory Visit: Payer: Self-pay | Admitting: Sports Medicine

## 2024-08-02 DIAGNOSIS — H401113 Primary open-angle glaucoma, right eye, severe stage: Secondary | ICD-10-CM | POA: Diagnosis not present

## 2024-08-02 DIAGNOSIS — H401124 Primary open-angle glaucoma, left eye, indeterminate stage: Secondary | ICD-10-CM | POA: Diagnosis not present

## 2024-08-02 DIAGNOSIS — I10 Essential (primary) hypertension: Secondary | ICD-10-CM

## 2024-08-09 DIAGNOSIS — I4891 Unspecified atrial fibrillation: Secondary | ICD-10-CM | POA: Diagnosis not present

## 2024-08-09 DIAGNOSIS — G4700 Insomnia, unspecified: Secondary | ICD-10-CM | POA: Diagnosis not present

## 2024-08-09 DIAGNOSIS — H409 Unspecified glaucoma: Secondary | ICD-10-CM | POA: Diagnosis not present

## 2024-08-09 DIAGNOSIS — M069 Rheumatoid arthritis, unspecified: Secondary | ICD-10-CM | POA: Diagnosis not present

## 2024-08-09 DIAGNOSIS — S93401D Sprain of unspecified ligament of right ankle, subsequent encounter: Secondary | ICD-10-CM | POA: Diagnosis not present

## 2024-08-09 DIAGNOSIS — E1122 Type 2 diabetes mellitus with diabetic chronic kidney disease: Secondary | ICD-10-CM | POA: Diagnosis not present

## 2024-08-09 DIAGNOSIS — I129 Hypertensive chronic kidney disease with stage 1 through stage 4 chronic kidney disease, or unspecified chronic kidney disease: Secondary | ICD-10-CM | POA: Diagnosis not present

## 2024-08-09 DIAGNOSIS — N1832 Chronic kidney disease, stage 3b: Secondary | ICD-10-CM | POA: Diagnosis not present

## 2024-08-09 DIAGNOSIS — G40409 Other generalized epilepsy and epileptic syndromes, not intractable, without status epilepticus: Secondary | ICD-10-CM | POA: Diagnosis not present

## 2024-08-16 ENCOUNTER — Other Ambulatory Visit: Payer: Self-pay

## 2024-08-16 DIAGNOSIS — I10 Essential (primary) hypertension: Secondary | ICD-10-CM

## 2024-08-16 MED ORDER — ATENOLOL 50 MG PO TABS: 50.0000 mg | ORAL_TABLET | Freq: Every day | ORAL | 3 refills | Status: DC

## 2024-09-14 ENCOUNTER — Ambulatory Visit: Payer: Self-pay

## 2024-09-14 ENCOUNTER — Emergency Department (HOSPITAL_BASED_OUTPATIENT_CLINIC_OR_DEPARTMENT_OTHER)

## 2024-09-14 ENCOUNTER — Encounter (HOSPITAL_BASED_OUTPATIENT_CLINIC_OR_DEPARTMENT_OTHER): Payer: Self-pay

## 2024-09-14 ENCOUNTER — Observation Stay (HOSPITAL_BASED_OUTPATIENT_CLINIC_OR_DEPARTMENT_OTHER)
Admission: EM | Admit: 2024-09-14 | Discharge: 2024-09-16 | Disposition: A | Attending: Family Medicine | Admitting: Family Medicine

## 2024-09-14 ENCOUNTER — Telehealth: Payer: Self-pay | Admitting: Sports Medicine

## 2024-09-14 DIAGNOSIS — K219 Gastro-esophageal reflux disease without esophagitis: Secondary | ICD-10-CM | POA: Diagnosis not present

## 2024-09-14 DIAGNOSIS — R0902 Hypoxemia: Secondary | ICD-10-CM | POA: Diagnosis not present

## 2024-09-14 DIAGNOSIS — J9601 Acute respiratory failure with hypoxia: Secondary | ICD-10-CM | POA: Diagnosis not present

## 2024-09-14 DIAGNOSIS — R2689 Other abnormalities of gait and mobility: Secondary | ICD-10-CM | POA: Diagnosis not present

## 2024-09-14 DIAGNOSIS — I771 Stricture of artery: Secondary | ICD-10-CM | POA: Diagnosis not present

## 2024-09-14 DIAGNOSIS — I509 Heart failure, unspecified: Secondary | ICD-10-CM | POA: Diagnosis not present

## 2024-09-14 DIAGNOSIS — I5033 Acute on chronic diastolic (congestive) heart failure: Secondary | ICD-10-CM | POA: Diagnosis not present

## 2024-09-14 DIAGNOSIS — I110 Hypertensive heart disease with heart failure: Secondary | ICD-10-CM | POA: Diagnosis not present

## 2024-09-14 DIAGNOSIS — J811 Chronic pulmonary edema: Secondary | ICD-10-CM | POA: Diagnosis not present

## 2024-09-14 DIAGNOSIS — E785 Hyperlipidemia, unspecified: Secondary | ICD-10-CM | POA: Diagnosis not present

## 2024-09-14 DIAGNOSIS — I700 Atherosclerosis of aorta: Secondary | ICD-10-CM | POA: Diagnosis not present

## 2024-09-14 DIAGNOSIS — G40909 Epilepsy, unspecified, not intractable, without status epilepticus: Secondary | ICD-10-CM | POA: Diagnosis not present

## 2024-09-14 DIAGNOSIS — I517 Cardiomegaly: Secondary | ICD-10-CM | POA: Diagnosis not present

## 2024-09-14 DIAGNOSIS — G2581 Restless legs syndrome: Secondary | ICD-10-CM | POA: Diagnosis not present

## 2024-09-14 LAB — COMPREHENSIVE METABOLIC PANEL WITH GFR
ALT: 143 U/L — ABNORMAL HIGH (ref 0–44)
AST: 78 U/L — ABNORMAL HIGH (ref 15–41)
Albumin: 4.3 g/dL (ref 3.5–5.0)
Alkaline Phosphatase: 143 U/L — ABNORMAL HIGH (ref 38–126)
Anion gap: 11 (ref 5–15)
BUN: 15 mg/dL (ref 8–23)
CO2: 26 mmol/L (ref 22–32)
Calcium: 9.3 mg/dL (ref 8.9–10.3)
Chloride: 102 mmol/L (ref 98–111)
Creatinine, Ser: 0.83 mg/dL (ref 0.44–1.00)
GFR, Estimated: >60 mL/min (ref >60)
Glucose, Bld: 136 mg/dL — ABNORMAL HIGH (ref 70–99)
Potassium: 3.7 mmol/L (ref 3.5–5.1)
Sodium: 139 mmol/L (ref 135–145)
Total Bilirubin: 1 mg/dL (ref 0.0–1.2)
Total Protein: 7.5 g/dL (ref 6.5–8.1)

## 2024-09-14 LAB — CBC WITH DIFFERENTIAL/PLATELET
Abs Immature Granulocytes: 0.03 K/uL (ref 0.00–0.07)
Basophils Absolute: 0 K/uL (ref 0.0–0.1)
Basophils Relative: 0 %
Eosinophils Absolute: 0.1 K/uL (ref 0.0–0.5)
Eosinophils Relative: 1 %
HCT: 34.9 % — ABNORMAL LOW (ref 36.0–46.0)
Hemoglobin: 11.9 g/dL — ABNORMAL LOW (ref 12.0–15.0)
Immature Granulocytes: 0 %
Lymphocytes Relative: 12 %
Lymphs Abs: 1.1 K/uL (ref 0.7–4.0)
MCH: 31.1 pg (ref 26.0–34.0)
MCHC: 34.1 g/dL (ref 30.0–36.0)
MCV: 91.1 fL (ref 80.0–100.0)
Monocytes Absolute: 0.7 K/uL (ref 0.1–1.0)
Monocytes Relative: 7 %
Neutro Abs: 7.5 K/uL (ref 1.7–7.7)
Neutrophils Relative %: 80 %
Platelets: 156 K/uL (ref 150–400)
RBC: 3.83 MIL/uL — ABNORMAL LOW (ref 3.87–5.11)
RDW: 16 % — ABNORMAL HIGH (ref 11.5–15.5)
WBC: 9.4 K/uL (ref 4.0–10.5)
nRBC: 0 % (ref 0.0–0.2)

## 2024-09-14 LAB — RESP PANEL BY RT-PCR (RSV, FLU A&B, COVID)  RVPGX2
Influenza A by PCR: NEGATIVE
Influenza B by PCR: NEGATIVE
Resp Syncytial Virus by PCR: NEGATIVE
SARS Coronavirus 2 by RT PCR: NEGATIVE

## 2024-09-14 LAB — TROPONIN T, HIGH SENSITIVITY
Troponin T High Sensitivity: 27 ng/L — ABNORMAL HIGH (ref 0–19)
Troponin T High Sensitivity: 30 ng/L — ABNORMAL HIGH (ref 0–19)

## 2024-09-14 LAB — PRO BRAIN NATRIURETIC PEPTIDE: Pro Brain Natriuretic Peptide: 2842 pg/mL — ABNORMAL HIGH (ref <300.0)

## 2024-09-14 MED ORDER — ALBUTEROL SULFATE (2.5 MG/3ML) 0.083% IN NEBU
2.5000 mg | INHALATION_SOLUTION | Freq: Once | RESPIRATORY_TRACT | Status: AC
  Administered 2024-09-14: 2.5 mg via RESPIRATORY_TRACT
  Filled 2024-09-14: qty 3

## 2024-09-14 MED ORDER — FUROSEMIDE 10 MG/ML IJ SOLN
20.0000 mg | Freq: Once | INTRAMUSCULAR | Status: AC
  Administered 2024-09-14: 20 mg via INTRAVENOUS
  Filled 2024-09-14: qty 2

## 2024-09-14 NOTE — Telephone Encounter (Signed)
 Looking the patient current medication I do not see the Rx for albuterol . I did advise the daughter that she will need to make an appointment in office with Ngetich, [name], NP in order to ask for new medication. Patient daughter did spoke to her mother and she stated that she feels much better and does not wan to come in for an appointment.    Message sent to Ngetich, [name], NP

## 2024-09-14 NOTE — Telephone Encounter (Signed)
 Recommend office or ED visit for evaluation of symptoms.

## 2024-09-14 NOTE — ED Notes (Addendum)
 Went to meet pt and she was short of breath in bed, sats 86% on RA. Placed on 4LNC and RT called to bedside. Pt sats now >92%

## 2024-09-14 NOTE — ED Provider Notes (Signed)
 [place] EMERGENCY DEPARTMENT AT [facility] Provider Note   CSN: [id number] Arrival date & time: 09/14/24  1911     Patient presents with: Shortness of Breath   Briana [name] is a 89 y.o. female.   Patient is a 89 year old female with past medical history of CVA complicated by seizure, glaucoma, hypertension presenting to the emergency department with shortness of breath.  Patient is here with her granddaughter who helps provide some history.  She states that she started of shortness of breath today.  States that she has a cough productive of clear mucus.  She denies any fevers but does report feeling congested.  She denies any lower extremity swelling.  She states that she has an albuterol  inhaler that she uses as needed at home but ran out and was unable to use it today.  She denies any known sick contacts.  The history is provided by the patient and a relative.  Shortness of Breath      Prior to Admission medications   Medication Sig Start Date End Date Taking? Authorizing Provider  acetaminophen  (TYLENOL ) 650 MG CR tablet Take 650 mg by mouth every 8 (eight) hours as needed for pain.    [provider]  acetaminophen -codeine  (TYLENOL  #3) 300-30 MG tablet Take 1-2 tablets by mouth every 4 (four) hours as needed for moderate pain (pain score 4-6). 06/09/24   Samtani, Jai-Gurmukh, MD  amLODipine  (NORVASC ) 10 MG tablet TAKE 1 TABLET EVERY DAY 05/25/24   Sherlynn Madden, MD  atenolol  (TENORMIN ) 50 MG tablet Take 1 tablet (50 mg total) by mouth daily. 08/16/24   Ngetich, [name], NP  atorvastatin  (LIPITOR) 40 MG tablet Take 1 tablet (40 mg total) by mouth at bedtime. 06/30/24   Sherlynn Madden, MD  b complex vitamins capsule Take 1 capsule by mouth daily.    [provider]  Cholecalciferol (VITAMIN D-3 PO) Take 1 capsule by mouth daily.    [provider]  clopidogrel  (PLAVIX ) 75 MG tablet Take 1 tablet (75 mg total) by mouth daily.  08/11/23   Sherlynn Madden, MD  COVID-19 mRNA vaccine [facility]) syringe Inject 0.5 mLs into the muscle as directed. 06/20/24   Sherlynn Madden, MD  Cyanocobalamin  (VITAMIN B-12 PO) Take 1 tablet by mouth daily. Patient not taking: Reported on 07/25/2024    [provider]  dorzolamide -timolol  (COSOPT ) 22.3-6.8 MG/ML ophthalmic solution Place 1 drop into both eyes 2 (two) times daily.    [provider]  hydroxychloroquine  (PLAQUENIL ) 200 MG tablet Take 200 mg by mouth 2 (two) times daily.    Ishmael Slough, MD  levETIRAcetam  (KEPPRA ) 500 MG tablet Take 1 tablet (500 mg total) by mouth 2 (two) times daily. 06/09/24   Samtani, Jai-Gurmukh, MD  Netarsudil -Latanoprost  (ROCKLATAN ) 0.02-0.005 % SOLN Place 1 drop into both eyes at bedtime.    [provider]  ondansetron  (ZOFRAN -ODT) 4 MG disintegrating tablet Take 1 tablet (4 mg total) by mouth every 8 (eight) hours as needed for nausea or vomiting. 06/19/24   Sherlynn Madden, MD  pantoprazole  (PROTONIX ) 40 MG tablet TAKE 1 TABLET TWICE DAILY 05/25/24   Sherlynn Madden, MD  rOPINIRole  (REQUIP ) 0.25 MG tablet Take 1 tablet (0.25 mg total) by mouth at bedtime as needed (Restless legs). 06/09/24   Samtani, Jai-Gurmukh, MD    Allergies: Aspirin ; Motrin  [ibuprofen ]; Percocet [oxycodone -acetaminophen ]; Zocor  [simvastatin ]; 2,4-d dimethylamine; Calan [verapamil]; Cardizem [diltiazem]; Hydrochlorothiazide; Valtrex  [valacyclovir ]; and Zestril [lisinopril]    Review of Systems  Respiratory:  Positive for shortness of  breath.     Updated Vital Signs BP (!) 145/118   Pulse 79   Temp 97.8 F (36.6 C) (Oral)   Resp (!) 31   Ht 5' 4 (1.626 m)   Wt 66.7 kg   SpO2 94%   BMI 25.23 kg/m   Physical Exam Vitals and nursing note reviewed.  Constitutional:      General: She is not in acute distress.    Appearance: She is well-developed.  HENT:     Head: Normocephalic.     Mouth/Throat:     Mouth: Mucous  membranes are moist.  Eyes:     Extraocular Movements: Extraocular movements intact.  Cardiovascular:     Rate and Rhythm: Normal rate and regular rhythm.  Pulmonary:     Effort: Tachypnea present. No accessory muscle usage or respiratory distress.     Breath sounds: Wheezing (Trace end expiratory in the upper lung fields, tight breath sounds) present.  Abdominal:     Palpations: Abdomen is soft.     Tenderness: There is no abdominal tenderness.  Musculoskeletal:        General: Normal range of motion.     Cervical back: Normal range of motion and neck supple.     Right lower leg: No edema.     Left lower leg: No edema.  Skin:    General: Skin is warm and dry.  Neurological:     General: No focal deficit present.     Mental Status: She is alert and oriented to person, place, and time.  Psychiatric:        Mood and Affect: Mood normal.        Behavior: Behavior normal.     (all labs ordered are listed, but only abnormal results are displayed) Labs Reviewed  CBC WITH DIFFERENTIAL/PLATELET - Abnormal; Notable for the following components:      Result Value   RBC 3.83 (*)    Hemoglobin 11.9 (*)    HCT 34.9 (*)    RDW 16.0 (*)    All other components within normal limits  COMPREHENSIVE METABOLIC PANEL WITH GFR - Abnormal; Notable for the following components:   Glucose, Bld 136 (*)    AST 78 (*)    ALT 143 (*)    Alkaline Phosphatase 143 (*)    All other components within normal limits  PRO BRAIN NATRIURETIC PEPTIDE - Abnormal; Notable for the following components:   Pro Brain Natriuretic Peptide 2,842.0 (*)    All other components within normal limits  TROPONIN T, HIGH SENSITIVITY - Abnormal; Notable for the following components:   Troponin T High Sensitivity 27 (*)    All other components within normal limits  RESP PANEL BY RT-PCR (RSV, FLU A&B, COVID)  RVPGX2  TROPONIN T, HIGH SENSITIVITY    EKG: EKG Interpretation Date/Time:  Thursday September 14 2024 19:24:02  EST Ventricular Rate:  77 PR Interval:  128 QRS Duration:  74 QT Interval:  562 QTC Calculation: 635 R Axis:   -15  Text Interpretation: Normal sinus rhythm Possible Anterior infarct (cited on or before 14-Sep-2024) Prolonged QT Abnormal ECG Interpretation limited secondary to artifact and baseline wander Confirmed by Ellouise Fine ([id number]) on 09/14/2024 7:30:37 PM  Radiology: DG Chest Portable 1 View Result Date: 09/14/2024 CLINICAL DATA:  SHOB EXAM: PORTABLE CHEST - 1 VIEW COMPARISON:  June 06, 2024 FINDINGS: Diffuse interstitial opacities throughout both lungs. No focal airspace consolidation, pleural effusion, or pneumothorax. Moderate cardiomegaly. Tortuous aorta with aortic atherosclerosis. No acute  fracture or destructive lesions. Multilevel thoracic osteophytosis. Osteopenia. IMPRESSION: Moderate cardiomegaly with findings of diffuse interstitial edema. Electronically Signed   By: Rogelia Myers M.D.   On: 09/14/2024 19:59     Procedures   Medications Ordered in the ED  albuterol  (PROVENTIL ) (2.5 MG/3ML) 0.083% nebulizer solution 2.5 mg (2.5 mg Nebulization Given 09/14/24 2022)  furosemide  (LASIX ) injection 20 mg (20 mg Intravenous Given 09/14/24 2145)    Clinical Course as of 09/14/24 2223  Thu Sep 14, 2024  2131 BNP significantly elevated with cardiomegaly and pulmonary edema on CXR concerning for new onset CHF. Lasix  will be started. Mildly elevated troponin, likely demand but will have repeat. Mild transaminitis slightly increased from baseline. With hypoxia here will be recommended admission for diuresis.  [VK]    Clinical Course User Index [VK] Kingsley, Faduma Cho K, DO                                 Medical Decision Making This patient presents to the ED with chief complaint(s) of shortness of breath with pertinent past medical history of CVA, seizures, glaucoma which further complicates the presenting complaint. The complaint involves an extensive differential  diagnosis and also carries with it a high risk of complications and morbidity.    The differential diagnosis includes ACS, arrhythmia, anemia, pneumonia, pneumothorax, pulmonary edema, pleural effusion, viral syndrome, bronchitis  Additional history obtained: Additional history obtained from family Records reviewed Primary Care Documents and outpatient neurology records  ED Course and Reassessment: On patient's arrival she is tachypneic and hypoxic but in no acute respiratory distress.  She was placed on 2 L nasal cannula.  She does have some trace end expiratory wheeze and tight breath sounds and will be given an albuterol  neb.  She had an EKG on arrival was limited by artifact but no obvious ischemic changes.  Will have labs and chest x-ray and viral swab performed and will be closely reassessed.  Independent labs interpretation:  The following labs were independently interpreted: elevated BNP and troponin  Independent visualization of imaging: - I independently visualized the following imaging with scope of interpretation limited to determining acute life threatening conditions related to emergency care: CXR, which revealed cardiomegaly with pulmonary edema  Consultation: - Consulted or discussed management/test interpretation w/ external professional: hospitalist  Consideration for admission or further workup: patient requires admission for new onset CHF Social Determinants of health: n/a    Amount and/or Complexity of Data Reviewed Labs: ordered. Radiology: ordered.  Risk Prescription drug management. Decision regarding hospitalization.       Final diagnoses:  Acute congestive heart failure, unspecified heart failure type [facility])  Hypoxia    ED Discharge Orders     None          Kingsley, Rydell Wiegel K, DO 09/14/24 2223

## 2024-09-14 NOTE — Telephone Encounter (Signed)
 Noted

## 2024-09-14 NOTE — Telephone Encounter (Signed)
 Patient requesting refill of albuterol . Unable to pend medication. Daughter also states fixed income and needs something affordable. [facility]

## 2024-09-14 NOTE — Telephone Encounter (Signed)
 FYI Only or Action Required?: FYI only for provider: ED advised.  Patient was last seen in primary care on 06/20/2024 by Sherlynn Madden, MD.  Called Nurse Triage reporting Breathing Problem.  Symptoms began today.  Interventions attempted: Nothing.  Symptoms are: unchanged.  Triage Disposition: Go to ED Now (Notify PCP)  Patient/caregiver understands and will follow disposition?: No, refuses disposition  Copied from CRM #[id number]. Topic: Clinical - Red Word Triage >> Sep 14, 2024 10:29 AM Mercer PEDLAR wrote: Red Word that prompted transfer to Nurse Triage: Lebron Surgeon (Daughter) stating patient is not feeling well and had shortness of breath. Reason for Disposition  [1] MODERATE difficulty breathing (e.g., speaks in phrases, SOB even at rest, pulse 100-120) AND [2] NEW-onset or WORSE than normal  Answer Assessment - Initial Assessment Questions Having a hard time getting her words out. Threw up in the middle of the night, says this is normal due to sputum production she has night.. I am having a hard time understanding patient. Daughter and son is with patient now.  I asked family if she is slurring her words and they said no, just seems to be having a hard time getting words out. States she didn't sleep well last night. Also didn't drink much water yesterday. Patient got animated when I advised hospital. Says she doesn't feel that bad. Has an albuterol  inhaler but it is empty. Doesn't normally use. Needs refilled. Daughter and son is with patient now.   1. RESPIRATORY STATUS: Describe your breathing? (e.g., wheezing, shortness of breath, unable to speak, severe coughing)      SOB at rest. 2. ONSET: When did this breathing problem begin?      Started today. 3. PATTERN Does the difficult breathing come and go, or has it been constant since it started?      constant 6. CARDIAC HISTORY: Do you have any history of heart disease? (e.g., heart attack, angina, bypass surgery,  angioplasty)      Stroke, HTN 8. CAUSE: What do you think is causing the breathing problem?      Unsure 9. OTHER SYMPTOMS: Do you have any other symptoms? (e.g., chest pain, cough, dizziness, fever, runny nose)     Denies 10. O2 SATURATION MONITOR:  Do you use an oxygen saturation monitor (pulse oximeter) at home? If Yes, ask: What is your reading (oxygen level) today? What is your usual oxygen saturation reading? (e.g., 95%)       Does not have  Protocols used: Breathing Difficulty-A-AH

## 2024-09-14 NOTE — Telephone Encounter (Signed)
 See notes from triage nurse as FYI

## 2024-09-14 NOTE — ED Triage Notes (Addendum)
 Pt states she has been [facility] all day.  Breathing shallow and heard for her to speak in sentences Sat 93% on RA. States she feels congested

## 2024-09-15 ENCOUNTER — Encounter (HOSPITAL_COMMUNITY): Payer: Self-pay | Admitting: Internal Medicine

## 2024-09-15 ENCOUNTER — Other Ambulatory Visit: Payer: Self-pay

## 2024-09-15 DIAGNOSIS — I5031 Acute diastolic (congestive) heart failure: Secondary | ICD-10-CM | POA: Diagnosis not present

## 2024-09-15 DIAGNOSIS — E785 Hyperlipidemia, unspecified: Secondary | ICD-10-CM | POA: Diagnosis not present

## 2024-09-15 DIAGNOSIS — I509 Heart failure, unspecified: Secondary | ICD-10-CM

## 2024-09-15 DIAGNOSIS — J9601 Acute respiratory failure with hypoxia: Secondary | ICD-10-CM | POA: Diagnosis not present

## 2024-09-15 DIAGNOSIS — I5033 Acute on chronic diastolic (congestive) heart failure: Secondary | ICD-10-CM | POA: Diagnosis not present

## 2024-09-15 DIAGNOSIS — J811 Chronic pulmonary edema: Secondary | ICD-10-CM | POA: Diagnosis not present

## 2024-09-15 DIAGNOSIS — Z743 Need for continuous supervision: Secondary | ICD-10-CM | POA: Diagnosis not present

## 2024-09-15 DIAGNOSIS — I110 Hypertensive heart disease with heart failure: Secondary | ICD-10-CM | POA: Diagnosis not present

## 2024-09-15 DIAGNOSIS — J989 Respiratory disorder, unspecified: Secondary | ICD-10-CM | POA: Diagnosis not present

## 2024-09-15 DIAGNOSIS — R0902 Hypoxemia: Secondary | ICD-10-CM | POA: Diagnosis present

## 2024-09-15 DIAGNOSIS — K219 Gastro-esophageal reflux disease without esophagitis: Secondary | ICD-10-CM | POA: Diagnosis not present

## 2024-09-15 DIAGNOSIS — G2581 Restless legs syndrome: Secondary | ICD-10-CM | POA: Diagnosis not present

## 2024-09-15 DIAGNOSIS — R2689 Other abnormalities of gait and mobility: Secondary | ICD-10-CM | POA: Diagnosis not present

## 2024-09-15 DIAGNOSIS — G40909 Epilepsy, unspecified, not intractable, without status epilepticus: Secondary | ICD-10-CM | POA: Diagnosis not present

## 2024-09-15 DIAGNOSIS — R0602 Shortness of breath: Secondary | ICD-10-CM | POA: Diagnosis not present

## 2024-09-15 MED ORDER — AMLODIPINE BESYLATE 10 MG PO TABS
10.0000 mg | ORAL_TABLET | Freq: Every day | ORAL | Status: DC
  Administered 2024-09-15 – 2024-09-16 (×2): 10 mg via ORAL
  Filled 2024-09-15: qty 2
  Filled 2024-09-15: qty 1

## 2024-09-15 MED ORDER — ONDANSETRON HCL 4 MG/2ML IJ SOLN: 4.0000 mg | Freq: Four times a day (QID) | INTRAMUSCULAR | Status: DC | PRN

## 2024-09-15 MED ORDER — ATORVASTATIN CALCIUM 40 MG PO TABS
40.0000 mg | ORAL_TABLET | Freq: Every day | ORAL | Status: DC
  Administered 2024-09-15: 40 mg via ORAL
  Filled 2024-09-15: qty 1

## 2024-09-15 MED ORDER — ORAL CARE MOUTH RINSE: 15.0000 mL | OROMUCOSAL | Status: DC | PRN

## 2024-09-15 MED ORDER — ONDANSETRON HCL 4 MG PO TABS: 4.0000 mg | ORAL_TABLET | Freq: Four times a day (QID) | ORAL | Status: DC | PRN

## 2024-09-15 MED ORDER — PANTOPRAZOLE SODIUM 40 MG PO TBEC
40.0000 mg | DELAYED_RELEASE_TABLET | Freq: Two times a day (BID) | ORAL | Status: DC
  Administered 2024-09-15 – 2024-09-16 (×3): 40 mg via ORAL
  Filled 2024-09-15 (×3): qty 1

## 2024-09-15 MED ORDER — TRAZODONE HCL 50 MG PO TABS: 25.0000 mg | ORAL_TABLET | Freq: Every evening | ORAL | Status: DC | PRN

## 2024-09-15 MED ORDER — ATENOLOL 50 MG PO TABS
50.0000 mg | ORAL_TABLET | Freq: Every day | ORAL | Status: DC
  Administered 2024-09-15 – 2024-09-16 (×2): 50 mg via ORAL
  Filled 2024-09-15: qty 1
  Filled 2024-09-15: qty 2

## 2024-09-15 MED ORDER — ACETAMINOPHEN 325 MG PO TABS: 650.0000 mg | ORAL_TABLET | Freq: Four times a day (QID) | ORAL | Status: DC | PRN

## 2024-09-15 MED ORDER — HYDROXYCHLOROQUINE SULFATE 200 MG PO TABS
200.0000 mg | ORAL_TABLET | Freq: Two times a day (BID) | ORAL | Status: DC
  Administered 2024-09-15 – 2024-09-16 (×3): 200 mg via ORAL
  Filled 2024-09-15 (×4): qty 1

## 2024-09-15 MED ORDER — CLOPIDOGREL BISULFATE 75 MG PO TABS
75.0000 mg | ORAL_TABLET | Freq: Every day | ORAL | Status: DC
  Administered 2024-09-15 – 2024-09-16 (×2): 75 mg via ORAL
  Filled 2024-09-15 (×2): qty 1

## 2024-09-15 MED ORDER — NETARSUDIL-LATANOPROST 0.02-0.005 % OP SOLN: 1.0000 [drp] | Freq: Every day | OPHTHALMIC | Status: DC

## 2024-09-15 MED ORDER — DORZOLAMIDE HCL-TIMOLOL MAL 2-0.5 % OP SOLN
1.0000 [drp] | Freq: Two times a day (BID) | OPHTHALMIC | Status: DC
  Administered 2024-09-15 – 2024-09-16 (×2): 1 [drp] via OPHTHALMIC
  Filled 2024-09-15: qty 10

## 2024-09-15 MED ORDER — LEVETIRACETAM 500 MG PO TABS
500.0000 mg | ORAL_TABLET | Freq: Two times a day (BID) | ORAL | Status: DC
  Administered 2024-09-15 – 2024-09-16 (×3): 500 mg via ORAL
  Filled 2024-09-15 (×3): qty 1

## 2024-09-15 MED ORDER — ALBUTEROL SULFATE (2.5 MG/3ML) 0.083% IN NEBU: 2.5000 mg | INHALATION_SOLUTION | RESPIRATORY_TRACT | Status: DC | PRN

## 2024-09-15 MED ORDER — ACETAMINOPHEN 650 MG RE SUPP: 650.0000 mg | Freq: Four times a day (QID) | RECTAL | Status: DC | PRN

## 2024-09-15 MED ORDER — FUROSEMIDE 10 MG/ML IJ SOLN: 20.0000 mg | Freq: Once | INTRAMUSCULAR | Status: DC

## 2024-09-15 MED ORDER — LATANOPROST 0.005 % OP SOLN
1.0000 [drp] | Freq: Every day | OPHTHALMIC | Status: DC
  Administered 2024-09-15: 1 [drp] via OPHTHALMIC
  Filled 2024-09-15: qty 2.5

## 2024-09-15 MED ORDER — ENOXAPARIN SODIUM 40 MG/0.4ML IJ SOSY
40.0000 mg | PREFILLED_SYRINGE | INTRAMUSCULAR | Status: DC
  Administered 2024-09-15: 40 mg via SUBCUTANEOUS
  Filled 2024-09-15: qty 0.4

## 2024-09-15 MED ORDER — ROPINIROLE HCL 0.25 MG PO TABS: 0.2500 mg | ORAL_TABLET | Freq: Every evening | ORAL | Status: DC | PRN

## 2024-09-15 MED ORDER — FUROSEMIDE 20 MG PO TABS
20.0000 mg | ORAL_TABLET | Freq: Every day | ORAL | Status: DC
  Administered 2024-09-16: 20 mg via ORAL
  Filled 2024-09-15: qty 1

## 2024-09-15 NOTE — Plan of Care (Signed)

## 2024-09-15 NOTE — ED Notes (Signed)
 Confirmed with Marcey, RN that all needed information on patient was received. Floor ready to receive patient when bed is finished cleaning.

## 2024-09-15 NOTE — ED Notes (Signed)
 [facility] called for transport, pt bed assignment is ready

## 2024-09-15 NOTE — ED Notes (Signed)
 Patient given oatmeal and grape juice for breakfast. Patient states no other needs at this time.

## 2024-09-15 NOTE — Progress Notes (Addendum)
   Brief Progress Note   _____________________________________________________________________________________________________________  Patient Name: Briana [name] Patient DOB: 06/08/1931 Date: 09/15/2024     Data: Patient being admitted with HF.    Action: Reached out to Dr Kenard and chart reviewed for level loading.    Response:  New order for patient to be admitted at [facility] or [facility].  ADDENDUM: Patient assigned to [id number] at this time. Updated nurse at [facility] ER, Damien PEAK.  _____________________________________________________________________________________________________________  [facility] RN [employer] [place] Please contact us  directly via secure chat (search for [facility]) or by calling us  at [phone] [facility]).

## 2024-09-15 NOTE — ED Notes (Signed)
Pt asleep, appears comfortable

## 2024-09-15 NOTE — ED Notes (Signed)
 Patient's daughter, Lebron, called and updated on patient's new room number and transfer to [facility].

## 2024-09-15 NOTE — ED Notes (Signed)
 Patient asked whether she would like to be seen by the hospitalist through video chat or what until she was transferred to be seen by a provider in person. Patient states she would feel more comfortable being seen by a provider in person.

## 2024-09-15 NOTE — ED Notes (Signed)
 Patient given a warm blanket per request. Patient states no other needs at this time.

## 2024-09-15 NOTE — H&P (Signed)
 History and Physical  STEPHAIE DARDIS FMW:[id number] DOB: [birth year] DOA: 09/14/2024  PCP: [name], NP   Chief Complaint: shortness of breath   HPI: Briana [name] is a 89 y.o. female with medical history significant for hypertension, hyperlipidemia prior CVA and related seizure on Eliquis being admitted to the hospital with acute hypoxic respiratory failure due to acute heart failure with preserved EF.  History is provided by the patient as well as her daughter who is at the bedside, she has had progressive shortness of breath over the last few days, with cough productive of clear sputum, she denies any fevers, or chest pain but she has been feeling congested.  She denies any lower extremity swelling, or orthopnea.  She was diagnosed with a CVA and related seizure last year, during that workup she had an echo which showed grade 2 diastolic dysfunction.  Workup in the emergency department showed hypoxia, elevated BNP, pulmonary edema on chest x-ray.  She was given a dose of IV Lasix , and on arrival here at [facility] she tells me that she has been urinating all day long and feels like her breathing is back to normal.  Review of Systems: Please see HPI for pertinent positives and negatives. A complete 10 system review of systems are otherwise negative.  Past Medical History:  Diagnosis Date   ACUTE URIS OF UNSPECIFIED SITE 11/26/2008   ALLERGIC ARTHRITIS OTHER SPECIFIED SITES 10/22/2010   OA + RA   ALLERGIC RHINITIS 10/28/2007   ARTHRITIS, KNEES, BILATERAL 10/28/2007   BACK PAIN 04/12/2009   BUNIONECTOMY, HX OF 10/28/2007   CELLULITIS&ABSCESS OF HAND EXCEPT FINGERS&THUMB 10/23/2010   Complication of anesthesia    itching and makes me crazy   Family history of anesthesia complication    DAUGHTER HAD HIVES AND  WENT WILD    HYPERLIPIDEMIA 10/28/2007   HYPERTENSION 10/28/2007   INSOMNIA UNSPECIFIED 10/31/2007   MYOSITIS 10/22/2010   NECK PAIN 12/03/2008   Pain in joint, site  unspecified 10/07/2010   Stroke (HCC) 06/23/2023   WRIST PAIN, LEFT 10/22/2010   Past Surgical History:  Procedure Laterality Date   arthroscopic surgery and rotator cuff repair left shoulder     arthroscopy right knee hx of surgery     BUNIONECTOMY     hx of   FOOT SURGERY     RIGHT   hand surgery for broken finger, remote     JOINT REPLACEMENT Right    knee   TOTAL KNEE ARTHROPLASTY  07/01/2012   Procedure: TOTAL KNEE ARTHROPLASTY;  Surgeon: LELON JONETTA Shari Mickey., MD;  Location: [facility] OR;  Service: Orthopedics;  Laterality: Right;  right total knee arthroplasty   TOTAL KNEE ARTHROPLASTY Left 06/13/2014   dr beverley   TOTAL KNEE ARTHROPLASTY Left 06/13/2014   Procedure: TOTAL KNEE ARTHROPLASTY;  Surgeon: Toribio JULIANNA Beverley, MD;  Location: [facility] OR;  Service: Orthopedics;  Laterality: Left;   Social History:  reports that she has never smoked. She has never used smokeless tobacco. She reports that she does not drink alcohol and does not use drugs.  Allergies  Allergen Reactions   Aspirin  Anaphylaxis, Shortness Of Breath and Swelling   Motrin  [Ibuprofen ] Anaphylaxis   Percocet [Oxycodone -Acetaminophen ] Nausea Only   Zocor  [Simvastatin ] Other (See Comments)    Myalgias    2,4-D Dimethylamine Other (See Comments)    Unknown reaction  (weed killer)   Calan [Verapamil] Swelling   Cardizem [Diltiazem] Swelling    Angioedema    Hydrochlorothiazide Swelling   Valtrex  [  Valacyclovir ] Other (See Comments)    Unknown reaction   Zestril [Lisinopril] Swelling    Family History  Problem Relation Age of Onset   Heart attack Father    Ovarian cancer Sister    Lung cancer Brother    High blood pressure Daughter    High blood pressure Son    Breast cancer Son    High blood pressure Son    High blood pressure Son    High blood pressure Son    Coronary artery disease Other    Heart attack Other    Colon cancer Neg Hx    Endometrial cancer Neg Hx      Prior to Admission medications   Medication  Sig Start Date End Date Taking? Authorizing Provider  acetaminophen  (TYLENOL ) 650 MG CR tablet Take 650 mg by mouth every 8 (eight) hours as needed for pain.   Yes [provider]  acetaminophen -codeine  (TYLENOL  #3) 300-30 MG tablet Take 1-2 tablets by mouth every 4 (four) hours as needed for moderate pain (pain score 4-6). 06/09/24  Yes Samtani, Jai-Gurmukh, MD  albuterol  (VENTOLIN  HFA) 108 (90 Base) MCG/ACT inhaler Inhale into the lungs. 09/14/24  Yes [provider]  amLODipine  (NORVASC ) 10 MG tablet TAKE 1 TABLET EVERY DAY 05/25/24  Yes Sherlynn Madden, MD  atenolol  (TENORMIN ) 50 MG tablet Take 1 tablet (50 mg total) by mouth daily. 08/16/24  Yes Ngetich, [name], NP  atorvastatin  (LIPITOR) 40 MG tablet Take 1 tablet (40 mg total) by mouth at bedtime. 06/30/24  Yes Sherlynn Madden, MD  clopidogrel  (PLAVIX ) 75 MG tablet Take 1 tablet (75 mg total) by mouth daily. 08/11/23  Yes Sherlynn Madden, MD  dorzolamide -timolol  (COSOPT ) 22.3-6.8 MG/ML ophthalmic solution Place 1 drop into both eyes 2 (two) times daily.   Yes [provider]  hydroxychloroquine  (PLAQUENIL ) 200 MG tablet Take 200 mg by mouth 2 (two) times daily.   Yes Ishmael Slough, MD  levETIRAcetam  (KEPPRA ) 500 MG tablet Take 1 tablet (500 mg total) by mouth 2 (two) times daily. 06/09/24  Yes Samtani, Jai-Gurmukh, MD  Netarsudil -Latanoprost  (ROCKLATAN ) 0.02-0.005 % SOLN Place 1 drop into both eyes at bedtime.   Yes [provider]  ondansetron  (ZOFRAN -ODT) 4 MG disintegrating tablet Take 1 tablet (4 mg total) by mouth every 8 (eight) hours as needed for nausea or vomiting. 06/19/24  Yes Sherlynn Madden, MD  pantoprazole  (PROTONIX ) 40 MG tablet TAKE 1 TABLET TWICE DAILY 05/25/24  Yes Sherlynn Madden, MD  rOPINIRole  (REQUIP ) 0.25 MG tablet Take 1 tablet (0.25 mg total) by mouth at bedtime as needed (Restless legs). 06/09/24  Yes Samtani, Jai-Gurmukh, MD  COVID-19 mRNA vaccine [facility])  syringe Inject 0.5 mLs into the muscle as directed. 06/20/24   Sherlynn Madden, MD  MNEXSPIKE 10 MCG/0.2ML SUSY  08/15/24   [provider]    Physical Exam: BP (!) 144/57 (BP Location: Right Arm)   Pulse 66   Temp 98.3 F (36.8 C) (Oral)   Resp 20   Ht 5' 4 (1.626 m)   Wt 66.7 kg   SpO2 100%   BMI 25.23 kg/m  General:  Alert, oriented, calm, in no acute distress, resting comfortably on room air and speaking in full sentences.  No cough. Cardiovascular: RRR, no murmurs or rubs, no peripheral edema  Respiratory: clear to auscultation bilaterally, no wheezes, no crackles  Abdomen: soft, nontender, nondistended, normal bowel tones heard  Skin: dry, no rashes  Musculoskeletal: no joint effusions, normal range of motion  Psychiatric: appropriate  affect, normal speech  Neurologic: extraocular muscles intact, clear speech, moving all extremities with intact sensorium         Labs on Admission:  Basic Metabolic Panel: Recent Labs  Lab 09/14/24 2022  NA 139  K 3.7  CL 102  CO2 26  GLUCOSE 136*  BUN 15  CREATININE 0.83  CALCIUM  9.3   Liver Function Tests: Recent Labs  Lab 09/14/24 2022  AST 78*  ALT 143*  ALKPHOS 143*  BILITOT 1.0  PROT 7.5  ALBUMIN 4.3   No results for input(s): LIPASE, AMYLASE in the last 168 hours. No results for input(s): AMMONIA in the last 168 hours. CBC: Recent Labs  Lab 09/14/24 2022  WBC 9.4  NEUTROABS 7.5  HGB 11.9*  HCT 34.9*  MCV 91.1  PLT 156   Cardiac Enzymes: No results for input(s): CKTOTAL, CKMB, CKMBINDEX, TROPONINI in the last 168 hours. BNP (last 3 results) No results for input(s): BNP in the last 8760 hours.  ProBNP (last 3 results) Recent Labs    09/14/24 2022  PROBNP 2,842.0*    CBG: No results for input(s): GLUCAP in the last 168 hours.  Radiological Exams on Admission: DG Chest Portable 1 View Result Date: 09/14/2024 CLINICAL DATA:  SHOB EXAM: PORTABLE CHEST - 1 VIEW  COMPARISON:  June 06, 2024 FINDINGS: Diffuse interstitial opacities throughout both lungs. No focal airspace consolidation, pleural effusion, or pneumothorax. Moderate cardiomegaly. Tortuous aorta with aortic atherosclerosis. No acute fracture or destructive lesions. Multilevel thoracic osteophytosis. Osteopenia. IMPRESSION: Moderate cardiomegaly with findings of diffuse interstitial edema. Electronically Signed   By: Rogelia Myers M.D.   On: 09/14/2024 19:59   Assessment/Plan Rosanna GALINA HADDOX is a 89 y.o. female with medical history significant for hypertension, hyperlipidemia prior CVA and related seizure on Eliquis being admitted to the hospital with acute hypoxic respiratory failure due to acute heart failure with preserved EF.   Acute heart failure with preserved EF-echo from September 2024 demonstrates EF 60%, with grade 2 diastolic dysfunction.  Given her advanced age, would would limit further diagnostics, or drastic medication changes. -Observation admission -Monitor on telemetry -She has improved significantly with IV Lasix  -Sodium restricted fluid restricted diet -Start low-dose p.o. Lasix  daily from the morning -Consult to heart failure navigator  Acute hypoxic respiratory failure-due to pulmonary edema from heart failure as above, this is now resolved and patient has been weaned to room air  History of CVA with associated seizure-continue Eliquis, Keppra   Abnormal troponin-trend is flat, likely mild troponin leak due to her heart failure.  Patient without acute EKG changes, or any chest pain.  Hyperlipidemia-Lipitor  GERD-Protonix   Restless legs-Requip  nightly as needed    Code Status: Full Code  Consults called: None  Admission status: Observation  Time spent: 50 minutes  Tesia Lybrand CHRISTELLA Gail MD [facility] Pager [phone]  If 7PM-7AM, please contact night-coverage [email or web address] Password [email or web address]  09/15/2024, 1:53 PM

## 2024-09-16 ENCOUNTER — Other Ambulatory Visit (HOSPITAL_COMMUNITY): Payer: Self-pay

## 2024-09-16 DIAGNOSIS — K219 Gastro-esophageal reflux disease without esophagitis: Secondary | ICD-10-CM | POA: Diagnosis not present

## 2024-09-16 DIAGNOSIS — G40909 Epilepsy, unspecified, not intractable, without status epilepticus: Secondary | ICD-10-CM | POA: Diagnosis not present

## 2024-09-16 DIAGNOSIS — E785 Hyperlipidemia, unspecified: Secondary | ICD-10-CM | POA: Diagnosis not present

## 2024-09-16 DIAGNOSIS — I5033 Acute on chronic diastolic (congestive) heart failure: Secondary | ICD-10-CM | POA: Diagnosis not present

## 2024-09-16 DIAGNOSIS — I110 Hypertensive heart disease with heart failure: Secondary | ICD-10-CM | POA: Diagnosis not present

## 2024-09-16 DIAGNOSIS — G2581 Restless legs syndrome: Secondary | ICD-10-CM | POA: Diagnosis not present

## 2024-09-16 DIAGNOSIS — J811 Chronic pulmonary edema: Secondary | ICD-10-CM | POA: Diagnosis not present

## 2024-09-16 DIAGNOSIS — J9601 Acute respiratory failure with hypoxia: Secondary | ICD-10-CM | POA: Diagnosis not present

## 2024-09-16 DIAGNOSIS — Z79899 Other long term (current) drug therapy: Secondary | ICD-10-CM | POA: Diagnosis not present

## 2024-09-16 LAB — COMPREHENSIVE METABOLIC PANEL WITH GFR
ALT: 81 U/L — ABNORMAL HIGH (ref 0–44)
AST: 43 U/L — ABNORMAL HIGH (ref 15–41)
Albumin: 3.6 g/dL (ref 3.5–5.0)
Alkaline Phosphatase: 115 U/L (ref 38–126)
Anion gap: 10 (ref 5–15)
BUN: 11 mg/dL (ref 8–23)
CO2: 28 mmol/L (ref 22–32)
Calcium: 8.7 mg/dL — ABNORMAL LOW (ref 8.9–10.3)
Chloride: 102 mmol/L (ref 98–111)
Creatinine, Ser: 0.92 mg/dL (ref 0.44–1.00)
GFR, Estimated: 58 mL/min — ABNORMAL LOW (ref >60)
Glucose, Bld: 102 mg/dL — ABNORMAL HIGH (ref 70–99)
Potassium: 3.4 mmol/L — ABNORMAL LOW (ref 3.5–5.1)
Sodium: 140 mmol/L (ref 135–145)
Total Bilirubin: 1.3 mg/dL — ABNORMAL HIGH (ref 0.0–1.2)
Total Protein: 6.5 g/dL (ref 6.5–8.1)

## 2024-09-16 LAB — CBC
HCT: 33.2 % — ABNORMAL LOW (ref 36.0–46.0)
Hemoglobin: 11.1 g/dL — ABNORMAL LOW (ref 12.0–15.0)
MCH: 31.1 pg (ref 26.0–34.0)
MCHC: 33.4 g/dL (ref 30.0–36.0)
MCV: 93 fL (ref 80.0–100.0)
Platelets: 146 K/uL — ABNORMAL LOW (ref 150–400)
RBC: 3.57 MIL/uL — ABNORMAL LOW (ref 3.87–5.11)
RDW: 15.8 % — ABNORMAL HIGH (ref 11.5–15.5)
WBC: 9.2 K/uL (ref 4.0–10.5)
nRBC: 0 % (ref 0.0–0.2)

## 2024-09-16 LAB — PRO BRAIN NATRIURETIC PEPTIDE: Pro Brain Natriuretic Peptide: 2953 pg/mL — ABNORMAL HIGH (ref <300.0)

## 2024-09-16 MED ORDER — FUROSEMIDE 20 MG PO TABS
20.0000 mg | ORAL_TABLET | Freq: Every day | ORAL | 0 refills | Status: AC | PRN
  Filled 2024-09-16: qty 30, 30d supply, fill #0

## 2024-09-16 NOTE — Evaluation (Signed)
 Physical Therapy Evaluation Patient Details Name: Briana [name] MRN: [id number] DOB: June 14, 1931 Today's Date: 09/16/2024  History of Present Illness  Pt is a 89 y/o female presenting on 12/4 with SOB. Found with hypoxia, pulmonary edema on chest xray. Admitted with acute heart failure with preserved EF. PMH includes: HTN, CVA, seizures, glaucoma, bil TKA.  Clinical Impression  Pt is a 89y.o. female with above HPI resulting in the deficits listed below (see PT Problem List). Pt has been using RW at baseline, able to perform some ADLs/mobility with MOD I and children assist as needed. Pt performed sit to stand transfer with close supervision for safety. Pt ambulated total of ~36ft with CGA progressed to close supervision - no overt LOB observed. Pt will have assist from multiple family members upon d/c. Pt will benefit from continued skilled PT during acute stay to maximize functional mobility and increase independence.      If plan is discharge home, recommend the following: A little help with walking and/or transfers;A little help with bathing/dressing/bathroom;Help with stairs or ramp for entrance;Assist for transportation;Assistance with cooking/housework   Can travel by private vehicle        Equipment Recommendations None recommended by PT (pt has neccessary DME)  Recommendations for Other Services       Functional Status Assessment Patient has had a recent decline in their functional status and demonstrates the ability to make significant improvements in function in a reasonable and predictable amount of time.     Precautions / Restrictions Precautions Precautions: Fall Recall of Precautions/Restrictions: Intact Precaution/Restrictions Comments: impaired vision Restrictions Weight Bearing Restrictions Per Provider Order: No      Mobility  Bed Mobility Overal bed mobility: Needs Assistance Bed Mobility: Supine to Sit     Supine to sit: Supervision     General bed  mobility comments: HOB lowered, no bed rails to simulate home environment.    Transfers Overall transfer level: Needs assistance Equipment used: Rolling walker (2 wheels) Transfers: Sit to/from Stand Sit to Stand: Supervision           General transfer comment: close supervision for safety. Shoes donned EOB with daughter's assist prior to OOB mobility.    Ambulation/Gait Ambulation/Gait assistance: Contact guard assist Gait Distance (Feet): 70 Feet Assistive device: Rolling walker (2 wheels) Gait Pattern/deviations: Step-through pattern, Decreased stride length, Trunk flexed Gait velocity: decreased     General Gait Details: progressed to close supervision, increased muscle fatigue following mobility. No overt LOB observed.  Stairs Stairs: Yes Stairs assistance: Contact guard assist Stair Management: Two rails, Step to pattern, Forwards Number of Stairs: 10 General stair comments: cues for hand placement, daughter able to verbalize and demonstrate how family assists at home. Pt will sometimes use cane as well.  Wheelchair Mobility     Tilt Bed    Modified Rankin (Stroke Patients Only)       Balance Overall balance assessment: Needs assistance Sitting-balance support: No upper extremity supported, Feet supported Sitting balance-Leahy Scale: Fair     Standing balance support: Bilateral upper extremity supported, No upper extremity supported, During functional activity Standing balance-Leahy Scale: Poor Standing balance comment: reliant on RW                             Pertinent Vitals/Pain Pain Assessment Pain Assessment: No/denies pain    Home Living Family/patient expects to be discharged to:: Private residence Living Arrangements: Children Available Help at Discharge: Family;Available 24 hours/day (  close to 24/7, if son leaves daughter comes) Type of Home: House Home Access: Stairs to enter Entrance Stairs-Rails: Conservation Officer, Historic Buildings of Steps: 5-6   Home Layout: One level Home Equipment: Shower seat;Hand held shower head;Grab bars - tub/shower;BSC/3in1;Rolling Environmental Consultant (2 wheels);Rollator (4 wheels);Wheelchair - manual Additional Comments: Had HHPT around Sept/Oct. Performs therapeutic exercises and does walking almost everyday for exercise.    Prior Function Prior Level of Function : Needs assist             Mobility Comments: using RW for mobility. +2 assist for safety (someone in front/other family member behind) during stair negotiation. ADLs Comments: Children assist with meds, transportation, cleaning, and cooking. Daughter assists set-up of showers and washes her back but otherwise pt washes herself with supervision. Dresses herself     Extremity/Trunk Assessment   Upper Extremity Assessment Upper Extremity Assessment: Defer to OT evaluation    Lower Extremity Assessment Lower Extremity Assessment: Generalized weakness       Communication   Communication Communication: No apparent difficulties    Cognition Arousal: Alert Behavior During Therapy: WFL for tasks assessed/performed                             Following commands: Impaired Following commands impaired: Follows one step commands with increased time     Cueing Cueing Techniques: Verbal cues, Tactile cues     General Comments General comments (skin integrity, edema, etc.): daughter present at bedside throughout, supportive and helpful.    Exercises     Assessment/Plan    PT Assessment Patient needs continued PT services  PT Problem List Decreased range of motion;Decreased strength;Decreased activity tolerance;Decreased balance;Decreased mobility       PT Treatment Interventions DME instruction;Gait training;Stair training;Functional mobility training;Therapeutic activities;Therapeutic exercise;Balance training;Patient/family education    PT Goals (Current goals can be found in the Care Plan section)   Acute Rehab PT Goals Patient Stated Goal: go home and improve endurance and strength PT Goal Formulation: With patient/family Time For Goal Achievement: 09/30/24 Potential to Achieve Goals: Good    Frequency Min 3X/week     Co-evaluation               AM-PAC PT 6 Clicks Mobility  Outcome Measure Help needed turning from your back to your side while in a flat bed without using bedrails?: None Help needed moving from lying on your back to sitting on the side of a flat bed without using bedrails?: A Little Help needed moving to and from a bed to a chair (including a wheelchair)?: A Little Help needed standing up from a chair using your arms (e.g., wheelchair or bedside chair)?: A Little Help needed to walk in hospital room?: A Little Help needed climbing 3-5 steps with a railing? : A Little 6 Click Score: 19    End of Session Equipment Utilized During Treatment: Gait belt Activity Tolerance: Patient tolerated treatment well Patient left: in bed;with call bell/phone within reach;with chair alarm set;with family/visitor present (sitting EOB doing home exercises per pt and family request) Nurse Communication: Mobility status;Other (comment) (pt sitting EOB, purewick removed needing replaced if appropriate.) PT Visit Diagnosis: Unsteadiness on feet (R26.81);Muscle weakness (generalized) (M62.81)    Time: 8646-8576 PT Time Calculation (min) (ACUTE ONLY): 30 min   Charges:   PT Evaluation $PT Eval Low Complexity: 1 Low PT Treatments $Therapeutic Activity: 8-22 mins PT General Charges $$ ACUTE PT VISIT: 1 Visit  Tinnie BERRY PT, DPT  Acute Rehabilitation Services  Office [phone]  09/16/2024, 4:23 PM

## 2024-09-16 NOTE — Hospital Course (Addendum)
 89 year old woman PMH diastolic dysfunction, presented with shortness of breath.  Admitted for acute diastolic CHF.  Consultants None   Procedures/Events None

## 2024-09-16 NOTE — Progress Notes (Signed)
 Discharge med in  a secure bag delivered to patient in room

## 2024-09-16 NOTE — Care Management Obs Status (Signed)
 MEDICARE OBSERVATION STATUS NOTIFICATION   Patient Details  Name: Briana [name] MRN: [id number] Date of Birth: 02/25/1931   Medicare Observation Status Notification Given:  Yes    Sonda Manuella Quill, RN 09/16/2024, 2:36 PM

## 2024-09-16 NOTE — Plan of Care (Signed)

## 2024-09-16 NOTE — TOC Transition Note (Signed)
 Transition of Care [facility]) - Discharge Note   Patient Details  Name: Briana [name] MRN: [id number] Date of Birth: [birth year]  Transition of Care [facility]) CM/SW Contact:  Sonda Manuella Quill, RN Phone Number: 09/16/2024, 5:33 PM   Clinical Narrative:    Home w/ family   Final next level of care: Home/Self Care Barriers to Discharge: No Barriers Identified   Patient Goals and CMS Choice            Discharge Placement                       Discharge Plan and Services Additional resources added to the After Visit Summary for     Discharge Planning Services: CM Consult            DME Arranged: N/A DME Agency: NA       HH Arranged: NA HH Agency: NA        Social Drivers of Health (SDOH) Interventions SDOH Screenings   Food Insecurity: No Food Insecurity (09/16/2024)  Housing: Low Risk  (09/16/2024)  Transportation Needs: No Transportation Needs (09/16/2024)  Utilities: Not At Risk (09/16/2024)  Alcohol Screen: Low Risk  (12/10/2022)  Depression (PHQ2-9): Low Risk  (04/12/2024)  Financial Resource Strain: Low Risk  (12/10/2022)  Physical Activity: Insufficiently Active (12/10/2022)  Social Connections: Moderately Integrated (09/15/2024)  Stress: No Stress Concern Present (12/10/2022)  Tobacco Use: Low Risk  (09/15/2024)     Readmission Risk Interventions     No data to display

## 2024-09-16 NOTE — Progress Notes (Signed)
 Discharge meds in a secure bag delivered to patient by this RN

## 2024-09-16 NOTE — Discharge Summary (Signed)
 Physician Discharge Summary   Patient: Briana [name] MRN: [id number] DOB: 08-19-1931  Admit date:     09/14/2024  Discharge date: 09/16/24  Discharge Physician: Toribio Door   PCP: Briana Roxan BROCKS, NP   Recommendations at discharge:   Diastolic CHF.  Started on Lasix  daily as needed Anytime you have any of the following symptoms: 1) 3 pound weight gain in 24 hours or 5 pounds in 1 week 2) shortness of breath, with or without a dry hacking cough 3) swelling in the hands, feet or stomach 4) if you have to sleep on extra pillows at night in order to breathe.   Discharge Diagnoses: Acute on chronic diastolic CHF  Acute hypoxic respiratory failure Seizure disorder  Hospital Course: 89 year old woman PMH diastolic dysfunction, presented with shortness of breath.  Admitted for acute diastolic CHF.  Acute component resolved with IV Lasix  and hypoxia quickly resolved.  Was discharged home in good condition with oral Lasix  as needed and ambulatory referral to cardiology.  Consultants None   Procedures/Events None   Acute on chronic diastolic CHF with echo from 2024 demonstrating grade 2 diastolic dysfunction. Condition improved rapidly with IV Lasix  and she was transitioned to oral therapy as needed. Given her general state of health I think it is reasonable to do diuretics as needed only with instructions  Acute hypoxic respiratory failure secondary to pulmonary edema quickly resolved with diuresis  Comorbidities including CVA and seizure disorder stable  Disposition: Home Diet recommendation:  Cardiac diet DISCHARGE MEDICATION: Allergies as of 09/16/2024       Reactions   Aspirin  Anaphylaxis, Shortness Of Breath, Swelling   Motrin  [ibuprofen ] Anaphylaxis   Percocet [oxycodone -acetaminophen ] Nausea Only   Zocor  [simvastatin ] Other (See Comments)   Myalgias    2,4-d Dimethylamine Other (See Comments)   Unknown reaction (weed killer)   Calan [verapamil] Swelling    Cardizem [diltiazem] Swelling   Angioedema    Hydrochlorothiazide Swelling   Valtrex  [valacyclovir ] Other (See Comments)   Unknown reaction   Zestril [lisinopril] Swelling        Medication List     STOP taking these medications    acetaminophen -codeine  300-30 MG tablet Commonly known as: TYLENOL  #3   COVID-19 mRNA vaccine syringe Commonly known as: SPIKEVAX   mNexspike 10 MCG/0.2ML Susy Generic drug: COVID-19 mRNA Vacc (Moderna)       TAKE these medications    acetaminophen  650 MG CR tablet Commonly known as: TYLENOL  Take 650 mg by mouth every 8 (eight) hours as needed for pain.   albuterol  108 (90 Base) MCG/ACT inhaler Commonly known as: VENTOLIN  HFA Inhale into the lungs.   amLODipine  10 MG tablet Commonly known as: NORVASC  TAKE 1 TABLET EVERY DAY   atenolol  50 MG tablet Commonly known as: TENORMIN  Take 1 tablet (50 mg total) by mouth daily.   atorvastatin  40 MG tablet Commonly known as: LIPITOR Take 1 tablet (40 mg total) by mouth at bedtime.   clopidogrel  75 MG tablet Commonly known as: PLAVIX  Take 1 tablet (75 mg total) by mouth daily.   dorzolamide -timolol  2-0.5 % ophthalmic solution Commonly known as: COSOPT  Place 1 drop into both eyes 2 (two) times daily.   furosemide  20 MG tablet Commonly known as: LASIX  Take 1 tablet (20 mg total) by mouth daily as needed. For 3 pound weight gain in 24 hours or 5 pounds in 1 week 2) shortness of breath, with or without a dry hacking cough 3) swelling in the hands, feet or stomach 4)  if you have to sleep on extra pillows at night in order to breathe.   hydroxychloroquine  200 MG tablet Commonly known as: PLAQUENIL  Take 200 mg by mouth 2 (two) times daily.   levETIRAcetam  500 MG tablet Commonly known as: KEPPRA  Take 1 tablet (500 mg total) by mouth 2 (two) times daily.   ondansetron  4 MG disintegrating tablet Commonly known as: ZOFRAN -ODT Take 1 tablet (4 mg total) by mouth every 8 (eight) hours as needed  for nausea or vomiting.   pantoprazole  40 MG tablet Commonly known as: PROTONIX  TAKE 1 TABLET TWICE DAILY   Rocklatan  0.02-0.005 % Soln Generic drug: Netarsudil -Latanoprost  Place 1 drop into both eyes at bedtime.   rOPINIRole  0.25 MG tablet Commonly known as: REQUIP  Take 1 tablet (0.25 mg total) by mouth at bedtime as needed (Restless legs).        Follow-up Information     [name], Briana C, NP. Schedule an appointment as soon as possible for a visit in 1 week(s).   Specialty: Family Medicine Contact information: [address] [place] [address] [phone]                Discharge Exam: Briana [name]   09/14/24 1924  Weight: 66.7 kg   Physical Exam Vitals reviewed.  Constitutional:      General: She is not in acute distress.    Appearance: She is not ill-appearing or toxic-appearing.  Cardiovascular:     Rate and Rhythm: Normal rate and regular rhythm.     Heart sounds: No murmur heard. Pulmonary:     Effort: Pulmonary effort is normal. No respiratory distress.     Breath sounds: No wheezing, rhonchi or rales.  Neurological:     Mental Status: She is alert.  Psychiatric:        Mood and Affect: Mood normal.        Behavior: Behavior normal.      Condition at discharge: good  The results of significant diagnostics from this hospitalization (including imaging, microbiology, ancillary and laboratory) are listed below for reference.   Imaging Studies: DG Chest Portable 1 View Result Date: 09/14/2024 CLINICAL DATA:  SHOB EXAM: PORTABLE CHEST - 1 VIEW COMPARISON:  June 06, 2024 FINDINGS: Diffuse interstitial opacities throughout both lungs. No focal airspace consolidation, pleural effusion, or pneumothorax. Moderate cardiomegaly. Tortuous aorta with aortic atherosclerosis. No acute fracture or destructive lesions. Multilevel thoracic osteophytosis. Osteopenia. IMPRESSION: Moderate cardiomegaly with findings of diffuse interstitial edema. Electronically  Signed   By: Briana [name] M.D.   On: 09/14/2024 19:59    Microbiology: Results for orders placed or performed during the hospital encounter of 09/14/24  Resp panel by RT-PCR (RSV, Flu A&B, Covid) Anterior Nasal Swab     Status: None   Collection Time: 09/14/24  8:24 PM   Specimen: Anterior Nasal Swab  Result Value Ref Range Status   SARS Coronavirus 2 by RT PCR NEGATIVE NEGATIVE Final    Comment: (NOTE) SARS-CoV-2 target nucleic acids are NOT DETECTED.  The SARS-CoV-2 RNA is generally detectable in upper respiratory specimens during the acute phase of infection. The lowest concentration of SARS-CoV-2 viral copies this assay can detect is 138 copies/mL. A negative result does not preclude SARS-Cov-2 infection and should not be used as the sole basis for treatment or other patient management decisions. A negative result may occur with  improper specimen collection/handling, submission of specimen other than nasopharyngeal swab, presence of viral mutation(s) within the areas targeted by this assay, and inadequate number  of viral copies(<138 copies/mL). A negative result must be combined with clinical observations, patient history, and epidemiological information. The expected result is Negative.  Fact Sheet for Patients:  bloggercourse.com  Fact Sheet for Healthcare Providers:  seriousbroker.it  This test is no t yet approved or cleared by the [place]  FDA and  has been authorized for detection and/or diagnosis of SARS-CoV-2 by FDA under an Emergency Use Authorization (EUA). This EUA will remain  in effect (meaning this test can be used) for the duration of the COVID-19 declaration under Section 564(b)(1) of the Act, 21 U.S.C.section 360bbb-3(b)(1), unless the authorization is terminated  or revoked sooner.       Influenza A by PCR NEGATIVE NEGATIVE Final   Influenza B by PCR NEGATIVE NEGATIVE Final    Comment:  (NOTE) The Xpert Xpress SARS-CoV-2/FLU/RSV plus assay is intended as an aid in the diagnosis of influenza from Nasopharyngeal swab specimens and should not be used as a sole basis for treatment. Nasal washings and aspirates are unacceptable for Xpert Xpress SARS-CoV-2/FLU/RSV testing.  Fact Sheet for Patients: bloggercourse.com  Fact Sheet for Healthcare Providers: seriousbroker.it  This test is not yet approved or cleared by the [place]  FDA and has been authorized for detection and/or diagnosis of SARS-CoV-2 by FDA under an Emergency Use Authorization (EUA). This EUA will remain in effect (meaning this test can be used) for the duration of the COVID-19 declaration under Section 564(b)(1) of the Act, 21 U.S.C. section 360bbb-3(b)(1), unless the authorization is terminated or revoked.     Resp Syncytial Virus by PCR NEGATIVE NEGATIVE Final    Comment: (NOTE) Fact Sheet for Patients: bloggercourse.com  Fact Sheet for Healthcare Providers: seriousbroker.it  This test is not yet approved or cleared by the [place]  FDA and has been authorized for detection and/or diagnosis of SARS-CoV-2 by FDA under an Emergency Use Authorization (EUA). This EUA will remain in effect (meaning this test can be used) for the duration of the COVID-19 declaration under Section 564(b)(1) of the Act, 21 U.S.C. section 360bbb-3(b)(1), unless the authorization is terminated or revoked.  Performed at [facility], [address], [place], [place] [address]     Labs: CBC: Recent Labs  Lab 09/14/24 2022 09/16/24 0442  WBC 9.4 9.2  NEUTROABS 7.5  --   HGB 11.9* 11.1*  HCT 34.9* 33.2*  MCV 91.1 93.0  PLT 156 146*   Basic Metabolic Panel: Recent Labs  Lab 09/14/24 2022 09/16/24 0442  NA 139 140  K 3.7 3.4*  CL 102 102  CO2 26 28  GLUCOSE 136* 102*  BUN 15 11   CREATININE 0.83 0.92  CALCIUM  9.3 8.7*   Liver Function Tests: Recent Labs  Lab 09/14/24 2022 09/16/24 0442  AST 78* 43*  ALT 143* 81*  ALKPHOS 143* 115  BILITOT 1.0 1.3*  PROT 7.5 6.5  ALBUMIN 4.3 3.6   CBG: No results for input(s): GLUCAP in the last 168 hours.  Discharge time spent: greater than 30 minutes.  Signed: Toribio Door, MD [facility] 09/16/2024

## 2024-09-16 NOTE — Progress Notes (Signed)
 Discharge instructions reviewed with patient and daughter. All questions answered. All belongings accounted for. Patient to follow up with MD in  1 weeks. Patient medications hand delivered from outpatient pharmacy. PIV removed. Assisted via WC to private vehicle.

## 2024-09-16 NOTE — Evaluation (Signed)
 Occupational Therapy Evaluation Patient Details Name: Briana [name] MRN: [id number] DOB: 07/05/1931 Today's Date: 09/16/2024   History of Present Illness   Pt is a 89 y/o female presenting on 12/4 with SOB. Found with hypoxia, pulmonary edema on chest xray. Admitted with acute heart failure with preserved EF. PMH includes: HTN, CVA, seizures, glaucoma, bil TKA.     Clinical Impressions PTA patient lives with her son, has assist for ADLs as needed from daughter (typically ind with dressing and bathing, but has supervision from daughter for shower transfers).  Using RW for mobility.  Pt has near 24/7 support from family.  Admitted for above and presents with problem list below.  Pt currently requires min to contact guard for transfers and in room mobility using RW, up to min assist/min guard for ADLs. Cueing for safety and impaired vision, but would likely do better in her own environment.  Based on performance today, pt will best benefit from continued OT services acutely and after dc at [facility] level to optimize independence and safety with ADLs, IADLs and mobility.     If plan is discharge home, recommend the following:   A little help with walking and/or transfers;A little help with bathing/dressing/bathroom;Assistance with cooking/housework;Assist for transportation;Help with stairs or ramp for entrance;Direct supervision/assist for medications management;Direct supervision/assist for financial management     Functional Status Assessment   Patient has had a recent decline in their functional status and demonstrates the ability to make significant improvements in function in a reasonable and predictable amount of time.     Equipment Recommendations   None recommended by OT     Recommendations for Other Services   PT consult     Precautions/Restrictions   Precautions Precautions: Fall Recall of Precautions/Restrictions: Intact Precaution/Restrictions Comments: impaired  vision Restrictions Weight Bearing Restrictions Per Provider Order: No     Mobility Bed Mobility Overal bed mobility: Needs Assistance Bed Mobility: Supine to Sit     Supine to sit: Contact guard     General bed mobility comments: HOB elevated but no physical assist required    Transfers Overall transfer level: Needs assistance Equipment used: Rolling walker (2 wheels) Transfers: Sit to/from Stand Sit to Stand: Contact guard assist, Min assist           General transfer comment: min assist fading to contact guard, cueing for hand placement.  min assist from low commode      Balance Overall balance assessment: Needs assistance Sitting-balance support: No upper extremity supported, Feet supported Sitting balance-Leahy Scale: Fair     Standing balance support: Bilateral upper extremity supported, No upper extremity supported, During functional activity Standing balance-Leahy Scale: Poor Standing balance comment: relies on RW                           ADL either performed or assessed with clinical judgement   ADL Overall ADL's : Needs assistance/impaired     Grooming: Contact guard assist;Wash/dry hands;Standing           Upper Body Dressing : Set up;Sitting   Lower Body Dressing: Minimal assistance;Sit to/from stand   Toilet Transfer: Minimal assistance;Ambulation;Rolling walker (2 wheels)   Toileting- Clothing Manipulation and Hygiene: Minimal assistance;Sit to/from stand       Functional mobility during ADLs: Minimal assistance;Contact guard assist;Rolling walker (2 wheels);Cueing for safety       Vision Baseline Vision/History: 3 Glaucoma Patient Visual Report: No change from baseline Vision Assessment?: Vision impaired-  to be further tested in functional context Additional Comments: baseline visual deficits, 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 and difficluty with L eye due to impairments from CVA     Perception         Praxis          Pertinent Vitals/Pain Pain Assessment Pain Assessment: No/denies pain     Extremity/Trunk Assessment Upper Extremity Assessment Upper Extremity Assessment: Generalized weakness   Lower Extremity Assessment Lower Extremity Assessment: Defer to PT evaluation       Communication Communication Communication: No apparent difficulties   Cognition Arousal: Alert Behavior During Therapy: WFL for tasks assessed/performed Cognition: No apparent impairments             OT - Cognition Comments: appears WFL, not formally asssessed                 Following commands: Impaired Following commands impaired: Follows one step commands with increased time     Cueing  General Comments   Cueing Techniques: Verbal cues;Tactile cues  daughter at side and supportive   Exercises     Shoulder Instructions      Home Living Family/patient expects to be discharged to:: Private residence Living Arrangements: Children Available Help at Discharge: Family;Available 24 hours/day (close to 24/7, if son leaves daughter comes) Type of Home: House Home Access: Stairs to enter [employer] of Steps: 5-6 Entrance Stairs-Rails: Right;Left Home Layout: One level     Bathroom Shower/Tub: Chief Strategy Officer: Handicapped height     Home Equipment: Shower seat;Hand held shower head;Grab bars - tub/shower;BSC/3in1;Rolling Environmental Consultant (2 wheels);Rollator (4 wheels);Wheelchair - manual          Prior Functioning/Environment Prior Level of Function : Needs assist             Mobility Comments: using RW for mobility ADLs Comments: Children assist with meds, transportation, cleaning, and cooking. Daughter assists set-up of showers and washes her back but otherwise pt washes herself with supervision. Dresses herself    OT Problem List: Decreased strength;Decreased activity tolerance;Impaired balance (sitting and/or standing);Decreased knowledge of use of DME  or AE;Decreased knowledge of precautions;Impaired vision/perception   OT Treatment/Interventions: Self-care/ADL training;Therapeutic exercise;DME and/or AE instruction;Therapeutic activities;Patient/family education;Balance training      OT Goals(Current goals can be found in the care plan section)   Acute Rehab OT Goals Patient Stated Goal: home OT Goal Formulation: With patient Time For Goal Achievement: 09/30/24 Potential to Achieve Goals: Good   OT Frequency:  Min 2X/week    Co-evaluation              AM-PAC OT 6 Clicks Daily Activity     Outcome Measure Help from another person eating meals?: None Help from another person taking care of personal grooming?: A Little Help from another person toileting, which includes using toliet, bedpan, or urinal?: A Little Help from another person bathing (including washing, rinsing, drying)?: A Little Help from another person to put on and taking off regular upper body clothing?: A Little Help from another person to put on and taking off regular lower body clothing?: A Little 6 Click Score: 19   End of Session Equipment Utilized During Treatment: Rolling walker (2 wheels) Nurse Communication: Mobility status;Precautions;Other (comment) (+ BM, asking for meds)  Activity Tolerance: Patient tolerated treatment well Patient left: in chair;with call bell/phone within reach;with chair alarm set;with family/visitor present  OT Visit Diagnosis: Other abnormalities of gait and mobility (R26.89);Muscle weakness (generalized) (M62.81)  Time: 9084-9060 OT Time Calculation (min): 24 min Charges:  OT General Charges $OT Visit: 1 Visit OT Evaluation $OT Eval Low Complexity: 1 Low  Etta NOVAK, OT Acute Rehabilitation Services Office [phone] Secure Chat Preferred    Etta GORMAN [place] 09/16/2024, 10:03 AM

## 2024-09-25 ENCOUNTER — Ambulatory Visit: Admitting: Family

## 2024-09-25 ENCOUNTER — Encounter: Payer: Self-pay | Admitting: Family

## 2024-09-25 VITALS — BP 124/80 | HR 76 | Temp 98.2°F | Resp 17 | Ht 64.0 in | Wt 145.4 lb

## 2024-09-25 DIAGNOSIS — E785 Hyperlipidemia, unspecified: Secondary | ICD-10-CM | POA: Diagnosis not present

## 2024-09-25 DIAGNOSIS — R112 Nausea with vomiting, unspecified: Secondary | ICD-10-CM

## 2024-09-25 DIAGNOSIS — I10 Essential (primary) hypertension: Secondary | ICD-10-CM | POA: Diagnosis not present

## 2024-09-25 DIAGNOSIS — R569 Unspecified convulsions: Secondary | ICD-10-CM

## 2024-09-25 MED ORDER — ATENOLOL 50 MG PO TABS: 50.0000 mg | ORAL_TABLET | Freq: Every day | ORAL | 3 refills | Status: AC

## 2024-09-25 MED ORDER — ONDANSETRON 4 MG PO TBDP: 4.0000 mg | ORAL_TABLET | Freq: Three times a day (TID) | ORAL | 3 refills | Status: AC | PRN

## 2024-09-25 NOTE — Progress Notes (Signed)
 "  Provider: Tyresha Fede FNP-C   Adri Schloss, Roxan BROCKS, NP  Patient Care Team: Jsoeph Podesta, Roxan BROCKS, NP as PCP - General (Family Medicine) Ishmael Slough, MD as Consulting Physician (Rheumatology) Octavia Charleston, MD as Consulting Physician (Ophthalmology) Avram Lupita BRAVO, MD as Consulting Physician (Gastroenterology) Fate Morna SAILOR, [facility] (Inactive) as Pharmacist (Pharmacist) Gayland Lauraine PARAS, NP as Nurse Practitioner (Neurology) Leva Rush, MD as Consulting Physician (Obstetrics and Gynecology) Arsenia Goracke, Roxan BROCKS, NP as Nurse Practitioner (Family Medicine) Cindie Ole DASEN, MD as Consulting Physician (Cardiology)  Extended Emergency Contact Information Primary Emergency Contact: [facility] Address: [address]          [place], [place] [address] [place] Home Phone: [phone] Mobile Phone: [phone] Relation: Daughter Secondary Emergency Contact: [facility] Address: [address]          [place], [place] [address] [place] Home Phone: [phone] Mobile Phone: [phone] Relation: Son  Code Status:  Full Code  Goals of care: Advanced Directive information    09/25/2024   10:24 AM  Advanced Directives  Does Patient Have a Medical Advance Directive? No  Would patient like information on creating a medical advance directive? No - Patient declined     Chief Complaint  Patient presents with   Hospitalization Follow-up    HPI:  Pt is a 89 y.o. female seen today for hospital follow up and medical management of chronic diseases.she was hospitalized from 09/14/2024 to 09/16/2024 after she presented to the emergency room with shortness of breath.  She was admitted for acute diastolic congestive heart failure which resolved with IV furosemide .She was also hypoxic due to pulmonary edema but resolved with diuretics. She was discharged home with oral furosemide  as needed to follow-up outpatient with cardiologist  Seizure - on Keppra , had  seizure in the Hospital but none since discharge.  GERD - on Protonix  she denies any blood in the stool or dark colored stool.  Chronic Nausea - request refill on Zofran .   RLS - No issues with restless leg on Requip  0.25 mg tablet   Past Medical History:  Diagnosis Date   ACUTE URIS OF UNSPECIFIED SITE 11/26/2008   ALLERGIC ARTHRITIS OTHER SPECIFIED SITES 10/22/2010   OA + RA   ALLERGIC RHINITIS 10/28/2007   ARTHRITIS, KNEES, BILATERAL 10/28/2007   BACK PAIN 04/12/2009   BUNIONECTOMY, HX OF 10/28/2007   CELLULITIS&ABSCESS OF HAND EXCEPT FINGERS&THUMB 10/23/2010   Complication of anesthesia    itching and makes me crazy   Family history of anesthesia complication    DAUGHTER HAD HIVES AND  WENT WILD    HYPERLIPIDEMIA 10/28/2007   HYPERTENSION 10/28/2007   INSOMNIA UNSPECIFIED 10/31/2007   MYOSITIS 10/22/2010   NECK PAIN 12/03/2008   Pain in joint, site unspecified 10/07/2010   Stroke (HCC) 06/23/2023   WRIST PAIN, LEFT 10/22/2010   Past Surgical History:  Procedure Laterality Date   arthroscopic surgery and rotator cuff repair left shoulder     arthroscopy right knee hx of surgery     BUNIONECTOMY     hx of   FOOT SURGERY     RIGHT   hand surgery for broken finger, remote     JOINT REPLACEMENT Right    knee   TOTAL KNEE ARTHROPLASTY  07/01/2012   Procedure: TOTAL KNEE ARTHROPLASTY;  Surgeon: LELON JONETTA Shari Mickey., MD;  Location: [facility] OR;  Service: Orthopedics;  Laterality: Right;  right total knee arthroplasty   TOTAL KNEE ARTHROPLASTY Left 06/13/2014   dr  murphy   TOTAL KNEE ARTHROPLASTY Left 06/13/2014   Procedure: TOTAL KNEE ARTHROPLASTY;  Surgeon: Toribio JULIANNA Chancy, MD;  Location[facility] OR;  Service: Orthopedics;  Laterality: Left;    Allergies[1]  Allergies as of 09/25/2024       Reactions   Aspirin  Anaphylaxis, Shortness Of Breath, Swelling   Motrin  [ibuprofen ] Anaphylaxis   Percocet [oxycodone -acetaminophen ] Nausea Only   Zocor  [simvastatin ] Other (See Comments)    Myalgias    2,4-d Dimethylamine Other (See Comments)   Unknown reaction (weed killer)   Calan [verapamil] Swelling   Cardizem [diltiazem] Swelling   Angioedema    Hydrochlorothiazide Swelling   Valtrex  [valacyclovir ] Other (See Comments)   Unknown reaction   Zestril [lisinopril] Swelling        Medication List        Accurate as of September 25, 2024 10:50 AM. If you have any questions, ask your nurse or doctor.          rOPINIRole  0.25 MG tablet Commonly known as: REQUIP  Take 1 tablet (0.25 mg total) by mouth at bedtime as needed (Restless legs). The timing of this medication is very important.   acetaminophen  650 MG CR tablet Commonly known as: TYLENOL  Take 650 mg by mouth every 8 (eight) hours as needed for pain.   albuterol  108 (90 Base) MCG/ACT inhaler Commonly known as: VENTOLIN  HFA Inhale into the lungs. What changed:  when to take this reasons to take this   amLODipine  10 MG tablet Commonly known as: NORVASC  TAKE 1 TABLET EVERY DAY   atenolol  50 MG tablet Commonly known as: TENORMIN  Take 1 tablet (50 mg total) by mouth daily.   atorvastatin  40 MG tablet Commonly known as: LIPITOR Take 1 tablet (40 mg total) by mouth at bedtime.   clopidogrel  75 MG tablet Commonly known as: PLAVIX  Take 1 tablet (75 mg total) by mouth daily.   dorzolamide -timolol  2-0.5 % ophthalmic solution Commonly known as: COSOPT  Place 1 drop into both eyes 2 (two) times daily.   furosemide  20 MG tablet Commonly known as: LASIX  Take 1 tablet (20 mg total) by mouth daily as needed. For 3 pound weight gain in 24 hours or 5 pounds in 1 week 2) shortness of breath, with or without a dry hacking cough 3) swelling in the hands, feet or stomach 4) if you have to sleep on extra pillows at night in order to breathe.   hydroxychloroquine  200 MG tablet Commonly known as: PLAQUENIL  Take 200 mg by mouth 2 (two) times daily.   levETIRAcetam  500 MG tablet Commonly known as: KEPPRA  Take  1 tablet (500 mg total) by mouth 2 (two) times daily.   ondansetron  4 MG disintegrating tablet Commonly known as: ZOFRAN -ODT Take 1 tablet (4 mg total) by mouth every 8 (eight) hours as needed for nausea or vomiting.   pantoprazole  40 MG tablet Commonly known as: PROTONIX  TAKE 1 TABLET TWICE DAILY   Rocklatan  0.02-0.005 % Soln Generic drug: Netarsudil -Latanoprost  Place 1 drop into both eyes at bedtime.        Review of Systems  Constitutional:  Negative for appetite change, chills, fatigue, fever and unexpected weight change.  HENT:  Negative for congestion, dental problem, ear discharge, ear pain, facial swelling, hearing loss, nosebleeds, postnasal drip, rhinorrhea, sinus pressure, sinus pain, sneezing, sore throat, tinnitus and trouble swallowing.   Eyes:  Negative for pain, discharge, redness, itching and visual disturbance.  Respiratory:  Negative for cough, chest tightness, shortness of breath and wheezing.   Cardiovascular:  Negative for chest pain, palpitations and leg swelling.  Gastrointestinal:  Negative for abdominal distention, abdominal pain, blood in stool, constipation, diarrhea, nausea and vomiting.  Endocrine: Negative for cold intolerance, heat intolerance, polydipsia, polyphagia and polyuria.  Genitourinary:  Negative for difficulty urinating, dysuria, flank pain, frequency and urgency.  Musculoskeletal:  Positive for gait problem. Negative for arthralgias, back pain, joint swelling, myalgias, neck pain and neck stiffness.  Skin:  Negative for color change, pallor, rash and wound.  Neurological:  Negative for dizziness, syncope, speech difficulty, weakness, light-headedness, numbness and headaches.       RLS controlled on Requip  No seizure activity since discharge from hospital  Hematological:  Does not bruise/bleed easily.  Psychiatric/Behavioral:  Negative for agitation, behavioral problems, confusion, hallucinations and sleep disturbance. The patient is not  nervous/anxious.     Immunization History  Administered Date(s) Administered   Fluad Trivalent(High Dose 65+) 06/25/2023   INFLUENZA, HIGH DOSE SEASONAL PF 07/24/2013, 07/15/2018, 07/15/2020, 07/22/2022   Influenza Split 07/03/2012   Influenza,inj,Quad PF,6+ Mos 08/06/2014   Influenza-Unspecified 07/12/2017, 07/18/2019, 07/15/2021, 07/11/2024   Moderna Covid-19 Fall Seasonal Vaccine 48yrs & older 07/31/2022, 07/23/2023   Moderna Sars-Covid-2 Vaccination 10/24/2019, 11/21/2019, 08/07/2020, 01/21/2021, 07/30/2021   Pfizer(Comirnaty)Fall Seasonal Vaccine 12 years and older 07/01/2022   Pneumococcal Conjugate-13 10/23/2015   Pneumococcal Polysaccharide-23 04/19/2014, 12/30/2020   RSV,unspecified 08/15/2024   Td 05/30/2012   Unspecified SARS-COV-2 Vaccination 08/15/2024   Zoster Recombinant(Shingrix ) 10/21/2021, 12/23/2021   Zoster, Live 07/13/2015   Pertinent  Health Maintenance Due  Topic Date Due   HEMOGLOBIN A1C  12/08/2024   Influenza Vaccine  Completed   Bone Density Scan  Completed   FOOT EXAM  Discontinued   OPHTHALMOLOGY EXAM  Discontinued      11/09/2023   11:15 AM 12/20/2023    3:20 PM 04/12/2024    9:36 AM 06/20/2024    2:36 PM 09/25/2024   10:20 AM  Fall Risk  Falls in the past year? 0 0 0 0 0  Was there an injury with Fall? 0  0  1  0  0  Fall Risk Category Calculator 0 0 1 0 0  Patient at Risk for Falls Due to  No Fall Risks  No Fall Risks No Fall Risks  Fall risk Follow up  Falls evaluation completed;Education provided;Falls prevention discussed  Falls evaluation completed Falls evaluation completed     Data saved with a previous flowsheet row definition   Functional Status Survey:    Vitals:   09/25/24 1027  BP: 124/80  Pulse: 76  Resp: 17  Temp: 98.2 F (36.8 C)  SpO2: 99%  Weight: 145 lb 6.4 oz (66 kg)  Height: 5' 4 (1.626 m)   Body mass index is 24.96 kg/m. Physical Exam Vitals reviewed.  Constitutional:      General: She is not in acute  distress.    Appearance: Normal appearance. She is normal weight. She is not ill-appearing or diaphoretic.  HENT:     Head: Normocephalic.     Right Ear: Tympanic membrane, ear canal and external ear normal. There is no impacted cerumen.     Left Ear: Tympanic membrane, ear canal and external ear normal. There is no impacted cerumen.     Nose: Nose normal. No congestion or rhinorrhea.     Mouth/Throat:     Mouth: Mucous membranes are moist.     Pharynx: Oropharynx is clear. No oropharyngeal exudate or posterior oropharyngeal erythema.  Eyes:     General: No scleral  icterus.       Right eye: No discharge.        Left eye: No discharge.     Extraocular Movements: Extraocular movements intact.     Conjunctiva/sclera: Conjunctivae normal.     Pupils: Pupils are equal, round, and reactive to light.  Neck:     Vascular: No carotid bruit.  Cardiovascular:     Rate and Rhythm: Normal rate and regular rhythm.     Pulses: Normal pulses.     Heart sounds: Normal heart sounds. No murmur heard.    No friction rub. No gallop.  Pulmonary:     Effort: Pulmonary effort is normal. No respiratory distress.     Breath sounds: Normal breath sounds. No wheezing, rhonchi or rales.  Chest:     Chest wall: No tenderness.  Abdominal:     General: Bowel sounds are normal. There is no distension.     Palpations: Abdomen is soft. There is no mass.     Tenderness: There is no abdominal tenderness. There is no right CVA tenderness, left CVA tenderness, guarding or rebound.  Musculoskeletal:        General: No swelling or tenderness. Normal range of motion.     Cervical back: Normal range of motion. No rigidity or tenderness.     Right lower leg: No edema.     Left lower leg: No edema.  Lymphadenopathy:     Cervical: No cervical adenopathy.  Skin:    General: Skin is warm and dry.     Coloration: Skin is not pale.     Findings: No bruising, erythema, lesion or rash.  Neurological:     Mental Status: She  is alert and oriented to person, place, and time.     Cranial Nerves: No cranial nerve deficit.     Sensory: No sensory deficit.     Motor: No weakness.     Coordination: Coordination normal.     Gait: Gait abnormal.  Psychiatric:        Mood and Affect: Mood normal.        Speech: Speech normal.        Behavior: Behavior normal.        Thought Content: Thought content normal.        Judgment: Judgment normal.    Labs reviewed: Recent Labs    06/06/24 2136 06/07/24 0849 06/08/24 0441 06/09/24 0333 06/20/24 1513 09/14/24 2022 09/16/24 0442  NA 139 140 141   < > 142 139 140  K 4.0 2.9* 3.8   < > 3.6 3.7 3.4*  CL 103 106 107   < > 104 102 102  CO2 18* 25 23   < > 31 26 28   GLUCOSE 148* 89 106*   < > 120* 136* 102*  BUN 19 15 9    < > 14 15 11   CREATININE 1.13* 0.97 0.79   < > 1.22* 0.83 0.92  CALCIUM  10.2 9.1 9.0   < > 9.3 9.3 8.7*  MG 2.0 2.1 1.8  --   --   --   --   PHOS  --  4.7* 3.3  --   --   --   --    < > = values in this interval not displayed.   Recent Labs    06/06/24 2136 06/20/24 1513 09/14/24 2022 09/16/24 0442  AST 47* 60* 78* 43*  ALT 64* 95* 143* 81*  ALKPHOS 149*  --  143* 115  BILITOT 0.9 0.5  1.0 1.3*  PROT 8.2* 6.8 7.5 6.5  ALBUMIN 4.6  --  4.3 3.6   Recent Labs    06/06/24 2136 06/07/24 0849 06/20/24 1513 09/14/24 2022 09/16/24 0442  WBC 10.7*   < > 9.9 9.4 9.2  NEUTROABS 7.7  --   --  7.5  --   HGB 13.4   < > 11.9 11.9* 11.1*  HCT 39.1   < > 36.5 34.9* 33.2*  MCV 89.7   < > 93.6 91.1 93.0  PLT 205   < > 312 156 146*   < > = values in this interval not displayed.   Lab Results  Component Value Date   TSH 1.440 06/23/2023   Lab Results  Component Value Date   HGBA1C 6.0 (H) 06/07/2024   Lab Results  Component Value Date   CHOL 176 06/24/2023   HDL 100 06/24/2023   LDLCALC 66 06/24/2023   LDLDIRECT 104.5 04/25/2010   TRIG 52 06/24/2023   CHOLHDL 1.8 06/24/2023    Significant Diagnostic Results in last 30 days:  DG Chest  Portable 1 View Result Date: 09/14/2024 CLINICAL DATA:  SHOB EXAM: PORTABLE CHEST - 1 VIEW COMPARISON:  June 06, 2024 FINDINGS: Diffuse interstitial opacities throughout both lungs. No focal airspace consolidation, pleural effusion, or pneumothorax. Moderate cardiomegaly. Tortuous aorta with aortic atherosclerosis. No acute fracture or destructive lesions. Multilevel thoracic osteophytosis. Osteopenia. IMPRESSION: Moderate cardiomegaly with findings of diffuse interstitial edema. Electronically Signed   By: Rogelia Myers M.D.   On: 09/14/2024 19:59    Assessment/Plan  1. Primary hypertension B/p well controlled - continue amlodipine ,Atenolol  and Furosemide  - CBC with Differential/Platelet - CMP - atenolol  (TENORMIN ) 50 MG tablet; Take 1 tablet (50 mg total) by mouth daily.  Dispense: 90 tablet; Refill: 3  2. Hyperlipidemia with target LDL less than 130 (Primary) LDL at goal  - Continue atorvastatin   - dietary modification and exercise   3. Seizures (HCC) No seizure since hospital discharge  - continue Levetiracetam    4. Nausea and vomiting, unspecified vomiting type Continue on Zofran    Family/ staff Communication: Reviewed plan of care with patient verbalized understanding   Labs/tests ordered:  - CBC with Differential/Platelet - CMP with eGFR([facility])  Next Appointment : Return in about 6 months (around 03/26/2025) for medical mangement of chronic issues.SABRA  Spent 30 minutes of Face to face and non-face to face with patient  >50% time spent counseling; reviewing medical record; tests; labs; documentation and developing future plan of care.   Roxan BROCKS Geonna Lockyer, NP       [1]  Allergies Allergen Reactions   Aspirin  Anaphylaxis, Shortness Of Breath and Swelling   Motrin  [Ibuprofen ] Anaphylaxis   Percocet [Oxycodone -Acetaminophen ] Nausea Only   Zocor  [Simvastatin ] Other (See Comments)    Myalgias    2,4-D Dimethylamine Other (See Comments)    Unknown reaction  (weed  killer)   Calan [Verapamil] Swelling   Cardizem [Diltiazem] Swelling    Angioedema    Hydrochlorothiazide Swelling   Valtrex  [Valacyclovir ] Other (See Comments)    Unknown reaction   Zestril [Lisinopril] Swelling   "

## 2024-09-26 LAB — CBC WITH DIFFERENTIAL/PLATELET
Absolute Lymphocytes: 1410 {cells}/uL (ref 850–3900)
Absolute Monocytes: 628 {cells}/uL (ref 200–950)
Basophils Absolute: 77 {cells}/uL (ref 0–200)
Basophils Relative: 0.9 %
Eosinophils Absolute: 112 {cells}/uL (ref 15–500)
Eosinophils Relative: 1.3 %
HCT: 39.1 % (ref 35.9–46.0)
Hemoglobin: 12.6 g/dL (ref 11.7–15.5)
MCH: 30.2 pg (ref 27.0–33.0)
MCHC: 32.2 g/dL (ref 31.6–35.4)
MCV: 93.8 fL (ref 81.4–101.7)
MPV: 10.9 fL (ref 7.5–12.5)
Monocytes Relative: 7.3 %
Neutro Abs: 6373 {cells}/uL (ref 1500–7800)
Neutrophils Relative %: 74.1 %
Platelets: 307 Thousand/uL (ref 140–400)
RBC: 4.17 Million/uL (ref 3.80–5.10)
RDW: 13.3 % (ref 11.0–15.0)
Total Lymphocyte: 16.4 %
WBC: 8.6 Thousand/uL (ref 3.8–10.8)

## 2024-09-26 LAB — COMPREHENSIVE METABOLIC PANEL WITH GFR
AG Ratio: 1.2 (calc) (ref 1.0–2.5)
ALT: 49 U/L — ABNORMAL HIGH (ref 6–29)
AST: 34 U/L (ref 10–35)
Albumin: 4.1 g/dL (ref 3.6–5.1)
Alkaline phosphatase (APISO): 131 U/L (ref 37–153)
BUN: 14 mg/dL (ref 7–25)
CO2: 27 mmol/L (ref 20–32)
Calcium: 9.3 mg/dL (ref 8.6–10.4)
Chloride: 106 mmol/L (ref 98–110)
Creat: 0.83 mg/dL (ref 0.60–0.95)
Globulin: 3.3 g/dL (ref 1.9–3.7)
Glucose, Bld: 105 mg/dL (ref 65–139)
Potassium: 3.7 mmol/L (ref 3.5–5.3)
Sodium: 142 mmol/L (ref 135–146)
Total Bilirubin: 0.4 mg/dL (ref 0.2–1.2)
Total Protein: 7.4 g/dL (ref 6.1–8.1)
eGFR: 67 mL/min/1.73m2 (ref >=60)

## 2024-09-27 ENCOUNTER — Ambulatory Visit: Admitting: Podiatry

## 2024-09-28 ENCOUNTER — Other Ambulatory Visit: Payer: Self-pay | Admitting: Family

## 2024-09-28 ENCOUNTER — Ambulatory Visit: Payer: Self-pay | Admitting: Family

## 2024-09-28 DIAGNOSIS — I6300 Cerebral infarction due to thrombosis of unspecified precerebral artery: Secondary | ICD-10-CM

## 2024-09-28 NOTE — Telephone Encounter (Unsigned)
 Copied from CRM #[id number]. Topic: Clinical - Medication Refill >> Sep 28, 2024  4:54 PM Briana [name] wrote: Medication: clopidogrel  clopidogrel  (PLAVIX ) 75 MG tablet   Has the patient contacted their pharmacy? Yes- 0 refills left (Agent: If no, request that the patient contact the pharmacy for the refill. If patient does not wish to contact the pharmacy document the reason why and proceed with request.) (Agent: If yes, when and what did the pharmacy advise?)  This is the patient's preferred pharmacy:  [facility] - [place], [place] - [address] [place] [address] Phone: [phone] Fax: [phone]   Is this the correct pharmacy for this prescription? Yes If no, delete pharmacy and type the correct one.   Has the prescription been filled recently? Yes  Is the patient out of the medication? No - Patient has a couple left   Has the patient been seen for an appointment in the last year OR does the patient have an upcoming appointment? Yes  Can we respond through MyChart? Yes  Agent: Please be advised that Rx refills may take up to 3 business days. We ask that you follow-up with your pharmacy.

## 2024-09-28 NOTE — Progress Notes (Signed)
 Liver enzymes can be repeated on next clinic visit to follow up

## 2024-09-28 NOTE — Telephone Encounter (Signed)
 Copied from CRM #[id number]. Topic: Clinical - Medication Refill >> Sep 28, 2024  4:14 PM Susanna ORN wrote: Medication: ondansetron  (ZOFRAN -ODT) 4 MG disintegrating tablet  Has the patient contacted their pharmacy? No (Agent: If no, request that the patient contact the pharmacy for the refill. If patient does not wish to contact the pharmacy document the reason why and proceed with request.) (Agent: If yes, when and what did the pharmacy advise?)  This is the patient's preferred pharmacy:  [facility] - [place], [place] - [address] [place] [address] Phone: [phone] Fax: [phone]  [id number] [place], [place] - [address] AT [facility] OF [address] [place] [address] Phone: [phone] Fax: [phone]  Is this the correct pharmacy for this prescription? Yes If no, delete pharmacy and type the correct one.   Has the prescription been filled recently? Yes  Is the patient out of the medication? Yes  Has the patient been seen for an appointment in the last year OR does the patient have an upcoming appointment? Yes  Can we respond through MyChart? Yes  Agent: Please be advised that Rx refills may take up to 3 business days. We ask that you follow-up with your pharmacy.

## 2024-09-29 MED ORDER — CLOPIDOGREL BISULFATE 75 MG PO TABS: 75.0000 mg | ORAL_TABLET | Freq: Every day | ORAL | 1 refills | Status: AC

## 2024-10-18 ENCOUNTER — Ambulatory Visit: Admitting: Podiatry

## 2024-10-18 ENCOUNTER — Encounter: Payer: Self-pay | Admitting: Podiatry

## 2024-10-18 DIAGNOSIS — B351 Tinea unguium: Secondary | ICD-10-CM

## 2024-10-18 DIAGNOSIS — M79675 Pain in left toe(s): Secondary | ICD-10-CM | POA: Diagnosis not present

## 2024-10-18 DIAGNOSIS — M79674 Pain in right toe(s): Secondary | ICD-10-CM

## 2024-10-18 DIAGNOSIS — E118 Type 2 diabetes mellitus with unspecified complications: Secondary | ICD-10-CM | POA: Diagnosis not present

## 2024-10-22 ENCOUNTER — Other Ambulatory Visit: Payer: Self-pay | Admitting: Sports Medicine

## 2024-10-22 DIAGNOSIS — I10 Essential (primary) hypertension: Secondary | ICD-10-CM

## 2024-10-22 DIAGNOSIS — R112 Nausea with vomiting, unspecified: Secondary | ICD-10-CM

## 2024-10-24 NOTE — Progress Notes (Signed)
"  °  Subjective:  Patient ID: Briana [name], female    DOB: [birth year],  MRN: [id number]  Briana [name] presents to clinic today for at risk foot care. Pt has h/o NIDDM with chronic kidney disease and painful mycotic toenails of both feet that are difficult to trim. Pain interferes with daily activities and wearing enclosed shoe gear comfortably. She is accompanied by her daughter on today's visit.  Chief Complaint  Patient presents with   RFC    Rm16 Routine foot care/ Dr. Jackalyn Blazing last visit September 2025   New problem(s): None.   Allergies[1]  Review of Systems: Negative except as noted in the HPI.  Objective: No changes noted in today's physical examination. There were no vitals filed for this visit. Briana [name] is a pleasant 89 y.o. female in NAD. AAO x 3.  Vascular Examination: CFT <3 seconds b/l. DP/PT pulses faintly palpable b/l. Digital hair absent. Skin temperature gradient warm to warm b/l. No pain with calf compression. No ischemia or gangrene. No cyanosis or clubbing noted b/l.    Neurological Examination: Sensation grossly intact b/l with 10 gram monofilament.  Dermatological Examination: Pedal skin warm and supple b/l.   No open wounds. No interdigital macerations.  Toenails 1-5 b/l thick, discolored, elongated with subungual debris and pain on dorsal palpation.    No corns, calluses, nor porokeratotic lesions.  Musculoskeletal Examination: Muscle strength 5/5 to all lower extremity muscle groups bilaterally. No pain, crepitus or joint limitation noted with ROM b/l LE. Pes planus deformity noted bilateral LE.Briana [name] Patient ambulates with rollator assistance.  Radiographs: None Last A1c:      Latest Ref Rng & Units 06/07/2024    8:49 AM  Hemoglobin A1C  Hemoglobin-A1c 4.8 - 5.6 % 6.0    Assessment/Plan: 1. Onychomycosis   2. Type II diabetes mellitus with manifestations [facility])   Patient was evaluated and treated. All patient's and/or POA's  questions/concerns addressed on today's visit. Mycotic toenails 1-5 b/l debrided in length and girth without incident.  Continue daily foot inspections and monitor blood glucose per PCP/Endocrinologist's recommendations.Continue soft, supportive shoe gear daily. Report any pedal injuries to medical professional. Call office if there are any quesitons/concerns. -Patient/POA to call should there be question/concern in the interim.   Return in about 3 months (around 01/16/2025).  Briana [name], DPM      [place] LOCATION: [address] [place] [address]                   Office [phone]   [place] LOCATION: [address] [place], [place] [address] Office [phone]     [1]  Allergies Allergen Reactions   Aspirin  Anaphylaxis, Shortness Of Breath and Swelling   Motrin  [Ibuprofen ] Anaphylaxis   Percocet [Oxycodone -Acetaminophen ] Nausea Only   Zocor  [Simvastatin ] Other (See Comments)    Myalgias    2,4-D Dimethylamine Other (See Comments)    Unknown reaction  (weed killer)   Calan [Verapamil] Swelling   Cardizem [Diltiazem] Swelling    Angioedema    Hydrochlorothiazide Swelling   Valtrex  [Valacyclovir ] Other (See Comments)    Unknown reaction   Zestril [Lisinopril] Swelling   "

## 2024-11-16 NOTE — Progress Notes (Unsigned)
 "     Cardiology Office Note Date:  11/16/2024  ID:  TANGELA DOLLIVER, DOB 1931-09-16, MRN [id number] PCP:  [name], NP  Cardiologist:  Joelle VEAR Ren Donley, MD  No chief complaint on file.    Problems CAD HFpEF TTE 9/24: 60-65%, mild LVH, GIIDD, severe LAE, mild AI Cryptogenic CVA M: AE10, AL50, AN40, CL75, FE20 as needed  Visits  EP 3/25: Cryptogenic CVA --> EM negative; offered ILR but patient declined Admit 12/25: HFpEF exacerbation; d/ced on PRN lasix  02/25: TTE, volume status    Discussed the use of AI scribe software for clinical note transcription with the patient, who gave verbal consent to proceed.  History of Present Illness    ROS: Please see the history of present illness. All other systems are reviewed and negative.   Past Medical History:  Diagnosis Date   ACUTE URIS OF UNSPECIFIED SITE 11/26/2008   ALLERGIC ARTHRITIS OTHER SPECIFIED SITES 10/22/2010   OA + RA   ALLERGIC RHINITIS 10/28/2007   ARTHRITIS, KNEES, BILATERAL 10/28/2007   BACK PAIN 04/12/2009   BUNIONECTOMY, HX OF 10/28/2007   CELLULITIS&ABSCESS OF HAND EXCEPT FINGERS&THUMB 10/23/2010   Complication of anesthesia    itching and makes me crazy   Family history of anesthesia complication    DAUGHTER HAD HIVES AND  WENT WILD    HYPERLIPIDEMIA 10/28/2007   HYPERTENSION 10/28/2007   INSOMNIA UNSPECIFIED 10/31/2007   MYOSITIS 10/22/2010   NECK PAIN 12/03/2008   Pain in joint, site unspecified 10/07/2010   Stroke (HCC) 06/23/2023   WRIST PAIN, LEFT 10/22/2010    Past Surgical History:  Procedure Laterality Date   arthroscopic surgery and rotator cuff repair left shoulder     arthroscopy right knee hx of surgery     BUNIONECTOMY     hx of   FOOT SURGERY     RIGHT   hand surgery for broken finger, remote     JOINT REPLACEMENT Right    knee   TOTAL KNEE ARTHROPLASTY  07/01/2012   Procedure: TOTAL KNEE ARTHROPLASTY;  Surgeon: LELON JONETTA Shari Mickey., MD;  Location: [facility] OR;  Service:  Orthopedics;  Laterality: Right;  right total knee arthroplasty   TOTAL KNEE ARTHROPLASTY Left 06/13/2014   dr beverley   TOTAL KNEE ARTHROPLASTY Left 06/13/2014   Procedure: TOTAL KNEE ARTHROPLASTY;  Surgeon: Toribio JULIANNA Beverley, MD;  Location: [facility] OR;  Service: Orthopedics;  Laterality: Left;    Current Outpatient Medications  Medication Sig Dispense Refill   acetaminophen  (TYLENOL ) 650 MG CR tablet Take 650 mg by mouth every 8 (eight) hours as needed for pain.     albuterol  (VENTOLIN  HFA) 108 (90 Base) MCG/ACT inhaler Inhale into the lungs. (Patient taking differently: Inhale into the lungs as needed.)     amLODipine  (NORVASC ) 10 MG tablet TAKE 1 TABLET EVERY DAY 90 tablet 1   atenolol  (TENORMIN ) 50 MG tablet Take 1 tablet (50 mg total) by mouth daily. 90 tablet 3   atorvastatin  (LIPITOR) 40 MG tablet Take 1 tablet (40 mg total) by mouth at bedtime. 90 tablet 3   clopidogrel  (PLAVIX ) 75 MG tablet Take 1 tablet (75 mg total) by mouth daily. 90 tablet 1   dorzolamide -timolol  (COSOPT ) 22.3-6.8 MG/ML ophthalmic solution Place 1 drop into both eyes 2 (two) times daily.     furosemide  (LASIX ) 20 MG tablet Take 1 tablet (20 mg total) by mouth daily as needed. For 3 pound weight gain in 24 hours or 5 pounds in 1 week  2) shortness of breath, with or without a dry hacking cough 3) swelling in the hands, feet or stomach 4) if you have to sleep on extra pillows at night in order to breathe. 30 tablet 0   hydroxychloroquine  (PLAQUENIL ) 200 MG tablet Take 200 mg by mouth 2 (two) times daily.     levETIRAcetam  (KEPPRA ) 500 MG tablet Take 1 tablet (500 mg total) by mouth 2 (two) times daily. 60 tablet 11   Netarsudil -Latanoprost  (ROCKLATAN ) 0.02-0.005 % SOLN Place 1 drop into both eyes at bedtime.     ondansetron  (ZOFRAN -ODT) 4 MG disintegrating tablet Take 1 tablet (4 mg total) by mouth every 8 (eight) hours as needed for nausea or vomiting. 20 tablet 3   pantoprazole  (PROTONIX ) 40 MG tablet TAKE 1 TABLET TWICE  DAILY 180 tablet 1   rOPINIRole  (REQUIP ) 0.25 MG tablet Take 1 tablet (0.25 mg total) by mouth at bedtime as needed (Restless legs). 30 tablet 11   No current facility-administered medications for this visit.    Allergies:   Aspirin ; Motrin  [ibuprofen ]; Percocet [oxycodone -acetaminophen ]; Zocor  [simvastatin ]; 2,4-d dimethylamine; Calan [verapamil]; Cardizem [diltiazem]; Hydrochlorothiazide; Valtrex  [valacyclovir ]; and Zestril [lisinopril]   Social History:  see above  Family History:  see above  PHYSICAL EXAM: VS:  There were no vitals taken for this visit. , BMI There is no height or weight on file to calculate BMI. GEN: Well nourished, well developed, in no acute distress HEENT: normal Neck: no JVD, carotid bruits, or masses Cardiac: ***RRR; no murmurs, rubs, or gallops,no edema  Respiratory:  CTAB bilaterally, normal work of breathing GI: soft, nontender, nondistended, + BS Extremities: No LE edema Skin: warm and dry, no rash Neuro:  Strength and sensation are intact  EKG: ***  Recent Labs: Reviewed  Studies: Reviewed  Assessment & Plan      Signed, Joelle VEAR Ren Donley, MD  11/16/2024 9:45 AM    [facility] "

## 2024-11-28 ENCOUNTER — Ambulatory Visit

## 2024-11-28 DIAGNOSIS — I639 Cerebral infarction, unspecified: Secondary | ICD-10-CM

## 2024-11-28 DIAGNOSIS — I2510 Atherosclerotic heart disease of native coronary artery without angina pectoris: Secondary | ICD-10-CM

## 2024-11-28 DIAGNOSIS — I5032 Chronic diastolic (congestive) heart failure: Secondary | ICD-10-CM

## 2024-12-26 ENCOUNTER — Encounter: Payer: Self-pay | Admitting: Family

## 2025-01-24 ENCOUNTER — Ambulatory Visit: Admitting: Podiatry

## 2025-02-20 ENCOUNTER — Ambulatory Visit: Admitting: Neurology

## 2025-03-26 ENCOUNTER — Ambulatory Visit: Admitting: Family
